# Patient Record
Sex: Male | Born: 1940 | Race: White | Hispanic: No | Marital: Married | Smoking: Current some day smoker
Health system: Southern US, Community
[De-identification: ages and names within clinical notes are randomized; demographics above are authoritative.]

## PROBLEM LIST (undated history)

## (undated) DIAGNOSIS — J45909 Unspecified asthma, uncomplicated: Secondary | ICD-10-CM

## (undated) DIAGNOSIS — M1990 Unspecified osteoarthritis, unspecified site: Secondary | ICD-10-CM

## (undated) DIAGNOSIS — N200 Calculus of kidney: Secondary | ICD-10-CM

## (undated) DIAGNOSIS — K219 Gastro-esophageal reflux disease without esophagitis: Secondary | ICD-10-CM

## (undated) DIAGNOSIS — J449 Chronic obstructive pulmonary disease, unspecified: Secondary | ICD-10-CM

## (undated) DIAGNOSIS — Z9981 Dependence on supplemental oxygen: Secondary | ICD-10-CM

## (undated) DIAGNOSIS — M48061 Spinal stenosis, lumbar region without neurogenic claudication: Secondary | ICD-10-CM

## (undated) DIAGNOSIS — H3530 Unspecified macular degeneration: Secondary | ICD-10-CM

## (undated) DIAGNOSIS — F419 Anxiety disorder, unspecified: Secondary | ICD-10-CM

## (undated) DIAGNOSIS — D649 Anemia, unspecified: Secondary | ICD-10-CM

## (undated) DIAGNOSIS — H35039 Hypertensive retinopathy, unspecified eye: Secondary | ICD-10-CM

## (undated) DIAGNOSIS — J90 Pleural effusion, not elsewhere classified: Secondary | ICD-10-CM

## (undated) DIAGNOSIS — I5022 Chronic systolic (congestive) heart failure: Secondary | ICD-10-CM

## (undated) DIAGNOSIS — I739 Peripheral vascular disease, unspecified: Secondary | ICD-10-CM

## (undated) DIAGNOSIS — E785 Hyperlipidemia, unspecified: Secondary | ICD-10-CM

## (undated) DIAGNOSIS — IMO0002 Reserved for concepts with insufficient information to code with codable children: Secondary | ICD-10-CM

## (undated) DIAGNOSIS — I10 Essential (primary) hypertension: Secondary | ICD-10-CM

## (undated) HISTORY — PX: KIDNEY STONE SURGERY: SHX686

## (undated) HISTORY — PX: LEG AMPUTATION: SHX1105

## (undated) HISTORY — PX: HERNIA REPAIR: SHX51

## (undated) HISTORY — PX: ABDOMINAL AORTIC ANEURYSM REPAIR: SUR1152

## (undated) HISTORY — PX: OTHER SURGICAL HISTORY: SHX169

## (undated) HISTORY — PX: NEPHROSTOMY TUBE PLACEMENT (ARMC HX): HXRAD1726

---

## 2004-10-09 ENCOUNTER — Emergency Department: Payer: Self-pay | Admitting: Unknown Physician Specialty

## 2005-05-13 ENCOUNTER — Emergency Department: Payer: Self-pay | Admitting: Unknown Physician Specialty

## 2011-05-22 DIAGNOSIS — K08129 Complete loss of teeth due to periodontal diseases, unspecified class: Secondary | ICD-10-CM | POA: Insufficient documentation

## 2011-11-10 DIAGNOSIS — H353290 Exudative age-related macular degeneration, unspecified eye, stage unspecified: Secondary | ICD-10-CM | POA: Insufficient documentation

## 2011-11-10 DIAGNOSIS — H35039 Hypertensive retinopathy, unspecified eye: Secondary | ICD-10-CM | POA: Insufficient documentation

## 2011-11-10 DIAGNOSIS — H25019 Cortical age-related cataract, unspecified eye: Secondary | ICD-10-CM | POA: Insufficient documentation

## 2011-11-10 DIAGNOSIS — H353190 Nonexudative age-related macular degeneration, unspecified eye, stage unspecified: Secondary | ICD-10-CM | POA: Insufficient documentation

## 2011-11-10 DIAGNOSIS — H2510 Age-related nuclear cataract, unspecified eye: Secondary | ICD-10-CM | POA: Insufficient documentation

## 2012-01-03 DIAGNOSIS — I7100 Dissection of unspecified site of aorta: Secondary | ICD-10-CM | POA: Insufficient documentation

## 2012-03-11 DIAGNOSIS — H3530 Unspecified macular degeneration: Secondary | ICD-10-CM | POA: Insufficient documentation

## 2013-04-04 DIAGNOSIS — G8929 Other chronic pain: Secondary | ICD-10-CM | POA: Insufficient documentation

## 2013-04-04 DIAGNOSIS — M549 Dorsalgia, unspecified: Secondary | ICD-10-CM | POA: Insufficient documentation

## 2013-10-24 ENCOUNTER — Emergency Department: Payer: Self-pay | Admitting: Emergency Medicine

## 2013-10-24 DIAGNOSIS — N390 Urinary tract infection, site not specified: Secondary | ICD-10-CM | POA: Diagnosis not present

## 2013-10-24 DIAGNOSIS — R339 Retention of urine, unspecified: Secondary | ICD-10-CM | POA: Diagnosis not present

## 2013-10-24 LAB — URINALYSIS, COMPLETE
BACTERIA: NONE SEEN
Bilirubin,UR: NEGATIVE
GLUCOSE, UR: NEGATIVE mg/dL (ref 0–75)
Ketone: NEGATIVE
NITRITE: POSITIVE
Ph: 7 (ref 4.5–8.0)
Protein: NEGATIVE
Specific Gravity: 1.008 (ref 1.003–1.030)
Squamous Epithelial: <1

## 2013-10-24 LAB — COMPREHENSIVE METABOLIC PANEL
AST: 12 U/L — AB (ref 15–37)
Albumin: 3.6 g/dL (ref 3.4–5.0)
Alkaline Phosphatase: 98 U/L
Anion Gap: 7 (ref 7–16)
BUN: 18 mg/dL (ref 7–18)
Bilirubin,Total: 0.3 mg/dL (ref 0.2–1.0)
Calcium, Total: 9.2 mg/dL (ref 8.5–10.1)
Chloride: 104 mmol/L (ref 98–107)
Co2: 26 mmol/L (ref 21–32)
Creatinine: 1.77 mg/dL — ABNORMAL HIGH (ref 0.60–1.30)
EGFR (African American): 44 — ABNORMAL LOW
EGFR (Non-African Amer.): 38 — ABNORMAL LOW
GLUCOSE: 96 mg/dL (ref 65–99)
OSMOLALITY: 276 (ref 275–301)
POTASSIUM: 4.4 mmol/L (ref 3.5–5.1)
SGPT (ALT): 23 U/L (ref 12–78)
Sodium: 137 mmol/L (ref 136–145)
Total Protein: 7.6 g/dL (ref 6.4–8.2)

## 2013-10-24 LAB — CBC
HCT: 40.8 % (ref 40.0–52.0)
HGB: 13.4 g/dL (ref 13.0–18.0)
MCH: 28.1 pg (ref 26.0–34.0)
MCHC: 32.9 g/dL (ref 32.0–36.0)
MCV: 85 fL (ref 80–100)
PLATELETS: 315 10*3/uL (ref 150–440)
RBC: 4.77 10*6/uL (ref 4.40–5.90)
RDW: 17.3 % — ABNORMAL HIGH (ref 11.5–14.5)
WBC: 11.8 10*3/uL — ABNORMAL HIGH (ref 3.8–10.6)

## 2013-11-01 ENCOUNTER — Emergency Department: Payer: Self-pay | Admitting: Internal Medicine

## 2013-11-04 ENCOUNTER — Emergency Department: Payer: Self-pay | Admitting: Emergency Medicine

## 2013-11-04 LAB — URINALYSIS, COMPLETE
BILIRUBIN, UR: NEGATIVE
Glucose,UR: NEGATIVE mg/dL (ref 0–75)
Ketone: NEGATIVE
Nitrite: NEGATIVE
PH: 7 (ref 4.5–8.0)
RBC,UR: 5 /HPF (ref 0–5)
SPECIFIC GRAVITY: 1.01 (ref 1.003–1.030)
Squamous Epithelial: NONE SEEN

## 2013-12-09 DIAGNOSIS — N138 Other obstructive and reflux uropathy: Secondary | ICD-10-CM | POA: Insufficient documentation

## 2013-12-09 DIAGNOSIS — N200 Calculus of kidney: Secondary | ICD-10-CM | POA: Insufficient documentation

## 2013-12-09 DIAGNOSIS — R399 Unspecified symptoms and signs involving the genitourinary system: Secondary | ICD-10-CM | POA: Insufficient documentation

## 2013-12-09 DIAGNOSIS — N401 Enlarged prostate with lower urinary tract symptoms: Secondary | ICD-10-CM

## 2013-12-17 ENCOUNTER — Ambulatory Visit: Payer: Self-pay | Admitting: Urology

## 2014-06-12 DIAGNOSIS — M5116 Intervertebral disc disorders with radiculopathy, lumbar region: Secondary | ICD-10-CM | POA: Insufficient documentation

## 2014-06-12 DIAGNOSIS — M5416 Radiculopathy, lumbar region: Secondary | ICD-10-CM | POA: Insufficient documentation

## 2014-07-10 ENCOUNTER — Inpatient Hospital Stay: Payer: Self-pay | Admitting: Internal Medicine

## 2014-07-10 DIAGNOSIS — E860 Dehydration: Secondary | ICD-10-CM | POA: Diagnosis present

## 2014-07-10 DIAGNOSIS — N2581 Secondary hyperparathyroidism of renal origin: Secondary | ICD-10-CM | POA: Diagnosis present

## 2014-07-10 DIAGNOSIS — N200 Calculus of kidney: Secondary | ICD-10-CM | POA: Diagnosis not present

## 2014-07-10 DIAGNOSIS — E871 Hypo-osmolality and hyponatremia: Secondary | ICD-10-CM | POA: Diagnosis present

## 2014-07-10 DIAGNOSIS — E876 Hypokalemia: Secondary | ICD-10-CM | POA: Diagnosis present

## 2014-07-10 DIAGNOSIS — N390 Urinary tract infection, site not specified: Secondary | ICD-10-CM | POA: Diagnosis not present

## 2014-07-10 DIAGNOSIS — E872 Acidosis: Secondary | ICD-10-CM | POA: Diagnosis present

## 2014-07-10 DIAGNOSIS — R531 Weakness: Secondary | ICD-10-CM | POA: Diagnosis not present

## 2014-07-10 DIAGNOSIS — D638 Anemia in other chronic diseases classified elsewhere: Secondary | ICD-10-CM | POA: Diagnosis present

## 2014-07-10 DIAGNOSIS — I129 Hypertensive chronic kidney disease with stage 1 through stage 4 chronic kidney disease, or unspecified chronic kidney disease: Secondary | ICD-10-CM | POA: Diagnosis present

## 2014-07-10 DIAGNOSIS — J449 Chronic obstructive pulmonary disease, unspecified: Secondary | ICD-10-CM | POA: Diagnosis present

## 2014-07-10 DIAGNOSIS — Z7982 Long term (current) use of aspirin: Secondary | ICD-10-CM | POA: Diagnosis not present

## 2014-07-10 DIAGNOSIS — N139 Obstructive and reflux uropathy, unspecified: Secondary | ICD-10-CM | POA: Diagnosis not present

## 2014-07-10 DIAGNOSIS — N186 End stage renal disease: Secondary | ICD-10-CM | POA: Diagnosis not present

## 2014-07-10 DIAGNOSIS — J45909 Unspecified asthma, uncomplicated: Secondary | ICD-10-CM | POA: Diagnosis present

## 2014-07-10 DIAGNOSIS — K219 Gastro-esophageal reflux disease without esophagitis: Secondary | ICD-10-CM | POA: Diagnosis present

## 2014-07-10 DIAGNOSIS — N179 Acute kidney failure, unspecified: Secondary | ICD-10-CM | POA: Diagnosis not present

## 2014-07-10 DIAGNOSIS — R4182 Altered mental status, unspecified: Secondary | ICD-10-CM | POA: Diagnosis not present

## 2014-07-10 DIAGNOSIS — N201 Calculus of ureter: Secondary | ICD-10-CM | POA: Diagnosis not present

## 2014-07-10 DIAGNOSIS — E875 Hyperkalemia: Secondary | ICD-10-CM | POA: Diagnosis not present

## 2014-07-10 DIAGNOSIS — J9811 Atelectasis: Secondary | ICD-10-CM | POA: Diagnosis not present

## 2014-07-10 DIAGNOSIS — N183 Chronic kidney disease, stage 3 (moderate): Secondary | ICD-10-CM | POA: Diagnosis present

## 2014-07-10 DIAGNOSIS — Z452 Encounter for adjustment and management of vascular access device: Secondary | ICD-10-CM | POA: Diagnosis not present

## 2014-07-10 DIAGNOSIS — R5383 Other fatigue: Secondary | ICD-10-CM | POA: Diagnosis not present

## 2014-07-10 DIAGNOSIS — N132 Hydronephrosis with renal and ureteral calculous obstruction: Secondary | ICD-10-CM | POA: Diagnosis present

## 2014-07-10 DIAGNOSIS — Z87442 Personal history of urinary calculi: Secondary | ICD-10-CM | POA: Diagnosis not present

## 2014-07-10 DIAGNOSIS — N170 Acute kidney failure with tubular necrosis: Secondary | ICD-10-CM | POA: Diagnosis not present

## 2014-07-10 DIAGNOSIS — Z992 Dependence on renal dialysis: Secondary | ICD-10-CM | POA: Diagnosis not present

## 2014-07-10 DIAGNOSIS — E1065 Type 1 diabetes mellitus with hyperglycemia: Secondary | ICD-10-CM | POA: Diagnosis not present

## 2014-07-10 DIAGNOSIS — I499 Cardiac arrhythmia, unspecified: Secondary | ICD-10-CM | POA: Diagnosis not present

## 2014-07-10 LAB — COMPREHENSIVE METABOLIC PANEL
ALBUMIN: 3.1 g/dL — AB (ref 3.4–5.0)
ALK PHOS: 150 U/L — AB
ALT: 23 U/L
Anion Gap: 13 (ref 7–16)
BUN: 150 mg/dL — ABNORMAL HIGH (ref 7–18)
Bilirubin,Total: 0.6 mg/dL (ref 0.2–1.0)
CO2: 12 mmol/L — AB (ref 21–32)
Calcium, Total: 8.4 mg/dL — ABNORMAL LOW (ref 8.5–10.1)
Chloride: 103 mmol/L (ref 98–107)
Creatinine: 12.63 mg/dL — ABNORMAL HIGH (ref 0.60–1.30)
EGFR (African American): 5 — ABNORMAL LOW
EGFR (Non-African Amer.): 4 — ABNORMAL LOW
Glucose: 108 mg/dL — ABNORMAL HIGH (ref 65–99)
Osmolality: 307 (ref 275–301)
Potassium: 7.6 mmol/L (ref 3.5–5.1)
SGOT(AST): 31 U/L (ref 15–37)
Sodium: 128 mmol/L — ABNORMAL LOW (ref 136–145)
TOTAL PROTEIN: 7.4 g/dL (ref 6.4–8.2)

## 2014-07-10 LAB — CBC
HCT: 36.6 % — ABNORMAL LOW (ref 40.0–52.0)
HGB: 11.4 g/dL — ABNORMAL LOW (ref 13.0–18.0)
MCH: 27.1 pg (ref 26.0–34.0)
MCHC: 31.2 g/dL — AB (ref 32.0–36.0)
MCV: 87 fL (ref 80–100)
PLATELETS: 209 10*3/uL (ref 150–440)
RBC: 4.22 10*6/uL — ABNORMAL LOW (ref 4.40–5.90)
RDW: 17.1 % — ABNORMAL HIGH (ref 11.5–14.5)
WBC: 26.8 10*3/uL — ABNORMAL HIGH (ref 3.8–10.6)

## 2014-07-10 LAB — BASIC METABOLIC PANEL
Anion Gap: 12 (ref 7–16)
BUN: 150 mg/dL — ABNORMAL HIGH (ref 7–18)
CO2: 13 mmol/L — AB (ref 21–32)
Calcium, Total: 8.1 mg/dL — ABNORMAL LOW (ref 8.5–10.1)
Chloride: 108 mmol/L — ABNORMAL HIGH (ref 98–107)
Creatinine: 12.06 mg/dL — ABNORMAL HIGH (ref 0.60–1.30)
EGFR (African American): 5 — ABNORMAL LOW
GFR CALC NON AF AMER: 4 — AB
GLUCOSE: 125 mg/dL — AB (ref 65–99)
OSMOLALITY: 317 (ref 275–301)
POTASSIUM: 7.3 mmol/L — AB (ref 3.5–5.1)
SODIUM: 133 mmol/L — AB (ref 136–145)

## 2014-07-10 LAB — PHOSPHORUS: PHOSPHORUS: 7.7 mg/dL — AB (ref 2.5–4.9)

## 2014-07-10 LAB — TROPONIN I: Troponin-I: <0.02 ng/mL

## 2014-07-10 LAB — URINALYSIS, COMPLETE
Bilirubin,UR: NEGATIVE
Glucose,UR: NEGATIVE mg/dL (ref 0–75)
Ketone: NEGATIVE
Nitrite: POSITIVE
PH: 5 (ref 4.5–8.0)
SQUAMOUS EPITHELIAL: NONE SEEN
Specific Gravity: 1.014 (ref 1.003–1.030)
WBC UR: 51 /HPF (ref 0–5)

## 2014-07-10 LAB — SODIUM, URINE, RANDOM: Sodium, Urine Random: 50 mmol/L (ref 20–110)

## 2014-07-10 LAB — PROTEIN, URINE, RANDOM: Protein, Random Urine: 50 mg/dL — ABNORMAL HIGH (ref 0–12)

## 2014-07-10 LAB — OSMOLALITY, URINE: OSMOLALITY: 414 mosm/kg

## 2014-07-10 LAB — CREATININE, URINE, RANDOM: Creatinine, Urine Random: 98.2 mg/dL (ref 30.0–125.0)

## 2014-07-10 LAB — CK: CK, Total: 21 U/L — ABNORMAL LOW

## 2014-07-11 LAB — BASIC METABOLIC PANEL
Anion Gap: 13 (ref 7–16)
Anion Gap: 11 (ref 7–16)
BUN: 112 mg/dL — AB (ref 7–18)
BUN: 108 mg/dL — ABNORMAL HIGH (ref 7–18)
CALCIUM: 7.9 mg/dL — AB (ref 8.5–10.1)
CHLORIDE: 106 mmol/L (ref 98–107)
CO2: 19 mmol/L — AB (ref 21–32)
CREATININE: 8.73 mg/dL — AB (ref 0.60–1.30)
Calcium, Total: 7.7 mg/dL — ABNORMAL LOW (ref 8.5–10.1)
Chloride: 104 mmol/L (ref 98–107)
Co2: 18 mmol/L — ABNORMAL LOW (ref 21–32)
Creatinine: 8.86 mg/dL — ABNORMAL HIGH (ref 0.60–1.30)
EGFR (African American): 8 — ABNORMAL LOW
EGFR (Non-African Amer.): 6 — ABNORMAL LOW
GFR CALC NON AF AMER: 6 — AB
GLUCOSE: 121 mg/dL — AB (ref 65–99)
GLUCOSE: 94 mg/dL (ref 65–99)
OSMOLALITY: 307 (ref 275–301)
OSMOLALITY: 306 (ref 275–301)
Potassium: 5.6 mmol/L — ABNORMAL HIGH (ref 3.5–5.1)
Potassium: 6 mmol/L — ABNORMAL HIGH (ref 3.5–5.1)
Sodium: 135 mmol/L — ABNORMAL LOW (ref 136–145)
Sodium: 136 mmol/L (ref 136–145)

## 2014-07-11 LAB — CBC WITH DIFFERENTIAL/PLATELET
Basophil #: 0 10*3/uL (ref 0.0–0.1)
Basophil %: 0.2 %
EOS ABS: 0 10*3/uL (ref 0.0–0.7)
EOS PCT: 0.4 %
HCT: 27.8 % — ABNORMAL LOW (ref 40.0–52.0)
HGB: 9 g/dL — ABNORMAL LOW (ref 13.0–18.0)
LYMPHS PCT: 7.6 %
Lymphocyte #: 0.8 10*3/uL — ABNORMAL LOW (ref 1.0–3.6)
MCH: 27.5 pg (ref 26.0–34.0)
MCHC: 32.3 g/dL (ref 32.0–36.0)
MCV: 85 fL (ref 80–100)
MONO ABS: 1.6 x10 3/mm — AB (ref 0.2–1.0)
Monocyte %: 16.3 %
Neutrophil #: 7.5 10*3/uL — ABNORMAL HIGH (ref 1.4–6.5)
Neutrophil %: 75.5 %
PLATELETS: 145 10*3/uL — AB (ref 150–440)
RBC: 3.26 10*6/uL — AB (ref 4.40–5.90)
RDW: 17.1 % — ABNORMAL HIGH (ref 11.5–14.5)
WBC: 10 10*3/uL (ref 3.8–10.6)

## 2014-07-11 LAB — PHOSPHORUS: PHOSPHORUS: 5.6 mg/dL — AB (ref 2.5–4.9)

## 2014-07-11 LAB — URINE CULTURE

## 2014-07-12 ENCOUNTER — Ambulatory Visit: Payer: Self-pay | Admitting: Internal Medicine

## 2014-07-12 LAB — BASIC METABOLIC PANEL
Anion Gap: 11 (ref 7–16)
BUN: 56 mg/dL — AB (ref 7–18)
CALCIUM: 7.6 mg/dL — AB (ref 8.5–10.1)
CO2: 22 mmol/L (ref 21–32)
Chloride: 106 mmol/L (ref 98–107)
Creatinine: 5.59 mg/dL — ABNORMAL HIGH (ref 0.60–1.30)
EGFR (African American): 13 — ABNORMAL LOW
EGFR (Non-African Amer.): 11 — ABNORMAL LOW
GLUCOSE: 84 mg/dL (ref 65–99)
OSMOLALITY: 292 (ref 275–301)
POTASSIUM: 4.6 mmol/L (ref 3.5–5.1)
Sodium: 139 mmol/L (ref 136–145)

## 2014-07-12 LAB — PROTEIN / CREATININE RATIO, URINE
CREATININE, URINE: 74.2 mg/dL (ref 30.0–125.0)
PROTEIN/CREAT. RATIO: 863 mg/g{creat} — AB (ref 0–200)
Protein, Random Urine: 64 mg/dL — ABNORMAL HIGH (ref 0–12)

## 2014-07-13 LAB — BASIC METABOLIC PANEL
ANION GAP: 12 (ref 7–16)
BUN: 52 mg/dL — ABNORMAL HIGH (ref 7–18)
CHLORIDE: 107 mmol/L (ref 98–107)
CREATININE: 5.59 mg/dL — AB (ref 0.60–1.30)
Calcium, Total: 7.4 mg/dL — ABNORMAL LOW (ref 8.5–10.1)
Co2: 21 mmol/L (ref 21–32)
GFR CALC AF AMER: 13 — AB
GFR CALC NON AF AMER: 11 — AB
Glucose: 88 mg/dL (ref 65–99)
OSMOLALITY: 293 (ref 275–301)
Potassium: 4.5 mmol/L (ref 3.5–5.1)
SODIUM: 140 mmol/L (ref 136–145)

## 2014-07-14 LAB — PRO B NATRIURETIC PEPTIDE: B-Type Natriuretic Peptide: 34421 pg/mL — ABNORMAL HIGH (ref 0–125)

## 2014-07-14 LAB — BASIC METABOLIC PANEL
Anion Gap: 9 (ref 7–16)
BUN: 52 mg/dL — AB (ref 7–18)
CO2: 21 mmol/L (ref 21–32)
Calcium, Total: 7.5 mg/dL — ABNORMAL LOW (ref 8.5–10.1)
Chloride: 112 mmol/L — ABNORMAL HIGH (ref 98–107)
Creatinine: 5.44 mg/dL — ABNORMAL HIGH (ref 0.60–1.30)
EGFR (African American): 13 — ABNORMAL LOW
GFR CALC NON AF AMER: 11 — AB
GLUCOSE: 90 mg/dL (ref 65–99)
Osmolality: 297 (ref 275–301)
Potassium: 4.8 mmol/L (ref 3.5–5.1)
SODIUM: 142 mmol/L (ref 136–145)

## 2014-07-14 LAB — UR PROT ELECTROPHORESIS, URINE RANDOM

## 2014-07-14 LAB — PROTEIN ELECTROPHORESIS(ARMC)

## 2014-07-15 LAB — BASIC METABOLIC PANEL
ANION GAP: 9 (ref 7–16)
BUN: 52 mg/dL — ABNORMAL HIGH (ref 7–18)
CREATININE: 5.8 mg/dL — AB (ref 0.60–1.30)
Calcium, Total: 8.2 mg/dL — ABNORMAL LOW (ref 8.5–10.1)
Chloride: 112 mmol/L — ABNORMAL HIGH (ref 98–107)
Co2: 20 mmol/L — ABNORMAL LOW (ref 21–32)
EGFR (African American): 12 — ABNORMAL LOW
GFR CALC NON AF AMER: 10 — AB
Glucose: 89 mg/dL (ref 65–99)
Osmolality: 295 (ref 275–301)
Potassium: 5.1 mmol/L (ref 3.5–5.1)
Sodium: 141 mmol/L (ref 136–145)

## 2014-07-15 LAB — PLATELET COUNT: PLATELETS: 255 10*3/uL (ref 150–440)

## 2014-07-16 LAB — CBC WITH DIFFERENTIAL/PLATELET
BASOS PCT: 0.8 %
Basophil #: 0.1 10*3/uL (ref 0.0–0.1)
Eosinophil #: 0.2 10*3/uL (ref 0.0–0.7)
Eosinophil %: 1.6 %
HCT: 24.6 % — ABNORMAL LOW (ref 40.0–52.0)
HGB: 7.7 g/dL — AB (ref 13.0–18.0)
LYMPHS PCT: 11 %
Lymphocyte #: 1.4 10*3/uL (ref 1.0–3.6)
MCH: 26.9 pg (ref 26.0–34.0)
MCHC: 31.4 g/dL — AB (ref 32.0–36.0)
MCV: 86 fL (ref 80–100)
MONO ABS: 0.8 x10 3/mm (ref 0.2–1.0)
Monocyte %: 6.3 %
Neutrophil #: 10.1 10*3/uL — ABNORMAL HIGH (ref 1.4–6.5)
Neutrophil %: 80.3 %
Platelet: 269 10*3/uL (ref 150–440)
RBC: 2.86 10*6/uL — AB (ref 4.40–5.90)
RDW: 16.8 % — ABNORMAL HIGH (ref 11.5–14.5)
WBC: 12.6 10*3/uL — AB (ref 3.8–10.6)

## 2014-07-16 LAB — IRON AND TIBC
IRON BIND. CAP.(TOTAL): 168 ug/dL — AB (ref 250–450)
IRON SATURATION: 13 %
IRON: 21 ug/dL — AB (ref 65–175)
UNBOUND IRON-BIND. CAP.: 147 ug/dL

## 2014-07-16 LAB — FERRITIN: Ferritin (ARMC): 182 ng/mL (ref 8–388)

## 2014-07-16 LAB — BASIC METABOLIC PANEL
Anion Gap: 6 — ABNORMAL LOW (ref 7–16)
BUN: 31 mg/dL — ABNORMAL HIGH (ref 7–18)
CHLORIDE: 106 mmol/L (ref 98–107)
Calcium, Total: 7.7 mg/dL — ABNORMAL LOW (ref 8.5–10.1)
Co2: 28 mmol/L (ref 21–32)
Creatinine: 4.15 mg/dL — ABNORMAL HIGH (ref 0.60–1.30)
EGFR (African American): 18 — ABNORMAL LOW
GFR CALC NON AF AMER: 15 — AB
Glucose: 97 mg/dL (ref 65–99)
Osmolality: 286 (ref 275–301)
Potassium: 4.5 mmol/L (ref 3.5–5.1)
SODIUM: 140 mmol/L (ref 136–145)

## 2014-07-16 LAB — RETICULOCYTES
ABSOLUTE RETIC COUNT: 0.0816 10*6/uL (ref 0.019–0.186)
RETICULOCYTE: 2.85 % (ref 0.4–3.1)

## 2014-07-17 LAB — KAPPA/LAMBDA FREE LIGHT CHAINS (ARMC)

## 2014-07-17 LAB — PROT IMMUNOELECTROPHORES(ARMC)

## 2014-07-19 LAB — CULTURE, BLOOD (SINGLE)

## 2014-08-11 ENCOUNTER — Ambulatory Visit: Payer: Self-pay | Admitting: Internal Medicine

## 2014-08-12 DIAGNOSIS — D631 Anemia in chronic kidney disease: Secondary | ICD-10-CM | POA: Diagnosis not present

## 2014-08-12 DIAGNOSIS — I10 Essential (primary) hypertension: Secondary | ICD-10-CM | POA: Diagnosis not present

## 2014-08-12 DIAGNOSIS — N179 Acute kidney failure, unspecified: Secondary | ICD-10-CM | POA: Diagnosis not present

## 2014-08-12 DIAGNOSIS — N189 Chronic kidney disease, unspecified: Secondary | ICD-10-CM | POA: Diagnosis not present

## 2014-08-12 DIAGNOSIS — N2581 Secondary hyperparathyroidism of renal origin: Secondary | ICD-10-CM | POA: Diagnosis not present

## 2014-08-13 ENCOUNTER — Inpatient Hospital Stay: Payer: Self-pay | Admitting: Internal Medicine

## 2014-08-13 DIAGNOSIS — I10 Essential (primary) hypertension: Secondary | ICD-10-CM | POA: Diagnosis not present

## 2014-08-13 DIAGNOSIS — N2581 Secondary hyperparathyroidism of renal origin: Secondary | ICD-10-CM | POA: Diagnosis not present

## 2014-08-13 DIAGNOSIS — T8249XD Other complication of vascular dialysis catheter, subsequent encounter: Secondary | ICD-10-CM | POA: Diagnosis not present

## 2014-08-13 DIAGNOSIS — D638 Anemia in other chronic diseases classified elsewhere: Secondary | ICD-10-CM | POA: Diagnosis present

## 2014-08-13 DIAGNOSIS — N139 Obstructive and reflux uropathy, unspecified: Secondary | ICD-10-CM | POA: Diagnosis not present

## 2014-08-13 DIAGNOSIS — E875 Hyperkalemia: Secondary | ICD-10-CM | POA: Diagnosis present

## 2014-08-13 DIAGNOSIS — Z87891 Personal history of nicotine dependence: Secondary | ICD-10-CM | POA: Diagnosis not present

## 2014-08-13 DIAGNOSIS — I120 Hypertensive chronic kidney disease with stage 5 chronic kidney disease or end stage renal disease: Secondary | ICD-10-CM | POA: Diagnosis present

## 2014-08-13 DIAGNOSIS — J449 Chronic obstructive pulmonary disease, unspecified: Secondary | ICD-10-CM | POA: Diagnosis present

## 2014-08-13 DIAGNOSIS — N179 Acute kidney failure, unspecified: Secondary | ICD-10-CM | POA: Diagnosis not present

## 2014-08-13 DIAGNOSIS — E871 Hypo-osmolality and hyponatremia: Secondary | ICD-10-CM | POA: Diagnosis present

## 2014-08-13 DIAGNOSIS — R1310 Dysphagia, unspecified: Secondary | ICD-10-CM | POA: Diagnosis present

## 2014-08-13 DIAGNOSIS — R109 Unspecified abdominal pain: Secondary | ICD-10-CM | POA: Diagnosis not present

## 2014-08-13 DIAGNOSIS — Z8249 Family history of ischemic heart disease and other diseases of the circulatory system: Secondary | ICD-10-CM | POA: Diagnosis not present

## 2014-08-13 DIAGNOSIS — Z87442 Personal history of urinary calculi: Secondary | ICD-10-CM | POA: Diagnosis not present

## 2014-08-13 DIAGNOSIS — Z87440 Personal history of urinary (tract) infections: Secondary | ICD-10-CM | POA: Diagnosis not present

## 2014-08-13 DIAGNOSIS — N132 Hydronephrosis with renal and ureteral calculous obstruction: Secondary | ICD-10-CM | POA: Diagnosis present

## 2014-08-13 DIAGNOSIS — N1330 Unspecified hydronephrosis: Secondary | ICD-10-CM | POA: Diagnosis not present

## 2014-08-13 DIAGNOSIS — N186 End stage renal disease: Secondary | ICD-10-CM | POA: Diagnosis present

## 2014-08-13 DIAGNOSIS — F419 Anxiety disorder, unspecified: Secondary | ICD-10-CM | POA: Diagnosis present

## 2014-08-13 DIAGNOSIS — M4800 Spinal stenosis, site unspecified: Secondary | ICD-10-CM | POA: Diagnosis present

## 2014-08-13 DIAGNOSIS — R634 Abnormal weight loss: Secondary | ICD-10-CM | POA: Diagnosis present

## 2014-08-13 DIAGNOSIS — K089 Disorder of teeth and supporting structures, unspecified: Secondary | ICD-10-CM | POA: Diagnosis present

## 2014-08-13 DIAGNOSIS — N170 Acute kidney failure with tubular necrosis: Secondary | ICD-10-CM | POA: Diagnosis present

## 2014-08-13 DIAGNOSIS — K228 Other specified diseases of esophagus: Secondary | ICD-10-CM | POA: Diagnosis not present

## 2014-08-13 DIAGNOSIS — K219 Gastro-esophageal reflux disease without esophagitis: Secondary | ICD-10-CM | POA: Diagnosis present

## 2014-08-13 DIAGNOSIS — G8929 Other chronic pain: Secondary | ICD-10-CM | POA: Diagnosis present

## 2014-08-13 DIAGNOSIS — N183 Chronic kidney disease, stage 3 (moderate): Secondary | ICD-10-CM | POA: Diagnosis not present

## 2014-08-13 DIAGNOSIS — E872 Acidosis: Secondary | ICD-10-CM | POA: Diagnosis present

## 2014-08-13 DIAGNOSIS — N202 Calculus of kidney with calculus of ureter: Secondary | ICD-10-CM | POA: Diagnosis present

## 2014-08-13 DIAGNOSIS — I5033 Acute on chronic diastolic (congestive) heart failure: Secondary | ICD-10-CM | POA: Diagnosis not present

## 2014-08-13 LAB — BASIC METABOLIC PANEL
Anion Gap: 16 (ref 7–16)
BUN: 133 mg/dL — AB (ref 7–18)
CALCIUM: 8.7 mg/dL (ref 8.5–10.1)
CHLORIDE: 104 mmol/L (ref 98–107)
Co2: 17 mmol/L — ABNORMAL LOW (ref 21–32)
Creatinine: 8.23 mg/dL — ABNORMAL HIGH (ref 0.60–1.30)
EGFR (African American): 8 — ABNORMAL LOW
EGFR (Non-African Amer.): 7 — ABNORMAL LOW
Glucose: 136 mg/dL — ABNORMAL HIGH (ref 65–99)
OSMOLALITY: 319 (ref 275–301)
POTASSIUM: 5.6 mmol/L — AB (ref 3.5–5.1)
SODIUM: 137 mmol/L (ref 136–145)

## 2014-08-14 LAB — BASIC METABOLIC PANEL
Anion Gap: 16 (ref 7–16)
BUN: 130 mg/dL — ABNORMAL HIGH (ref 7–18)
CHLORIDE: 104 mmol/L (ref 98–107)
CREATININE: 8.29 mg/dL — AB (ref 0.60–1.30)
Calcium, Total: 8.8 mg/dL (ref 8.5–10.1)
Co2: 16 mmol/L — ABNORMAL LOW (ref 21–32)
EGFR (African American): 8 — ABNORMAL LOW
EGFR (Non-African Amer.): 7 — ABNORMAL LOW
Glucose: 146 mg/dL — ABNORMAL HIGH (ref 65–99)
OSMOLALITY: 316 (ref 275–301)
Potassium: 5.4 mmol/L — ABNORMAL HIGH (ref 3.5–5.1)
Sodium: 136 mmol/L (ref 136–145)

## 2014-08-14 LAB — POTASSIUM: Potassium: 4.5 mmol/L (ref 3.5–5.1)

## 2014-08-14 LAB — PHOSPHORUS: Phosphorus: 8.7 mg/dL — ABNORMAL HIGH (ref 2.5–4.9)

## 2014-08-14 LAB — PLATELET COUNT: Platelet: 190 10*3/uL (ref 150–440)

## 2014-08-15 LAB — BASIC METABOLIC PANEL
Anion Gap: 14 (ref 7–16)
BUN: 101 mg/dL — AB (ref 7–18)
Calcium, Total: 8.3 mg/dL — ABNORMAL LOW (ref 8.5–10.1)
Chloride: 100 mmol/L (ref 98–107)
Co2: 23 mmol/L (ref 21–32)
Creatinine: 6.71 mg/dL — ABNORMAL HIGH (ref 0.60–1.30)
EGFR (African American): 11 — ABNORMAL LOW
EGFR (Non-African Amer.): 9 — ABNORMAL LOW
Glucose: 115 mg/dL — ABNORMAL HIGH (ref 65–99)
Osmolality: 306 (ref 275–301)
Potassium: 4.3 mmol/L (ref 3.5–5.1)
SODIUM: 137 mmol/L (ref 136–145)

## 2014-08-15 LAB — URINALYSIS, COMPLETE
BILIRUBIN, UR: NEGATIVE
Bacteria: NONE SEEN
KETONE: NEGATIVE
NITRITE: NEGATIVE
PH: 6 (ref 4.5–8.0)
Protein: 100
RBC,UR: 5 /HPF (ref 0–5)
SQUAMOUS EPITHELIAL: NONE SEEN
Specific Gravity: 1.015 (ref 1.003–1.030)
WBC UR: 20 /HPF (ref 0–5)

## 2014-08-15 LAB — CBC WITH DIFFERENTIAL/PLATELET
BASOS ABS: 0 10*3/uL (ref 0.0–0.1)
BASOS PCT: 0.1 %
EOS ABS: 0 10*3/uL (ref 0.0–0.7)
Eosinophil %: 0.4 %
HCT: 29.3 % — ABNORMAL LOW (ref 40.0–52.0)
HGB: 9.2 g/dL — AB (ref 13.0–18.0)
Lymphocyte #: 1.2 10*3/uL (ref 1.0–3.6)
Lymphocyte %: 11.1 %
MCH: 28.4 pg (ref 26.0–34.0)
MCHC: 31.3 g/dL — ABNORMAL LOW (ref 32.0–36.0)
MCV: 91 fL (ref 80–100)
MONOS PCT: 6 %
Monocyte #: 0.7 x10 3/mm (ref 0.2–1.0)
NEUTROS PCT: 82.4 %
Neutrophil #: 9.1 10*3/uL — ABNORMAL HIGH (ref 1.4–6.5)
Platelet: 175 10*3/uL (ref 150–440)
RBC: 3.23 10*6/uL — AB (ref 4.40–5.90)
RDW: 20.9 % — ABNORMAL HIGH (ref 11.5–14.5)
WBC: 11.1 10*3/uL — ABNORMAL HIGH (ref 3.8–10.6)

## 2014-08-16 LAB — BASIC METABOLIC PANEL
ANION GAP: 13 (ref 7–16)
BUN: 61 mg/dL — ABNORMAL HIGH (ref 7–18)
CO2: 24 mmol/L (ref 21–32)
Calcium, Total: 7.9 mg/dL — ABNORMAL LOW (ref 8.5–10.1)
Chloride: 102 mmol/L (ref 98–107)
Creatinine: 4.02 mg/dL — ABNORMAL HIGH (ref 0.60–1.30)
EGFR (African American): 19 — ABNORMAL LOW
EGFR (Non-African Amer.): 16 — ABNORMAL LOW
GLUCOSE: 104 mg/dL — AB (ref 65–99)
OSMOLALITY: 295 (ref 275–301)
Potassium: 4.8 mmol/L (ref 3.5–5.1)
SODIUM: 139 mmol/L (ref 136–145)

## 2014-08-16 LAB — URINE CULTURE

## 2014-08-17 LAB — RENAL FUNCTION PANEL
ALBUMIN: 3 g/dL — AB (ref 3.4–5.0)
Anion Gap: 11 (ref 7–16)
BUN: 53 mg/dL — AB (ref 7–18)
CHLORIDE: 101 mmol/L (ref 98–107)
CO2: 27 mmol/L (ref 21–32)
Calcium, Total: 8.1 mg/dL — ABNORMAL LOW (ref 8.5–10.1)
Creatinine: 3.83 mg/dL — ABNORMAL HIGH (ref 0.60–1.30)
EGFR (African American): 20 — ABNORMAL LOW
GFR CALC NON AF AMER: 17 — AB
GLUCOSE: 142 mg/dL — AB (ref 65–99)
Osmolality: 294 (ref 275–301)
Phosphorus: 2.9 mg/dL (ref 2.5–4.9)
Potassium: 4.3 mmol/L (ref 3.5–5.1)
Sodium: 139 mmol/L (ref 136–145)

## 2014-08-17 LAB — BASIC METABOLIC PANEL
Anion Gap: 12 (ref 7–16)
BUN: 55 mg/dL — ABNORMAL HIGH (ref 7–18)
Calcium, Total: 8.4 mg/dL — ABNORMAL LOW (ref 8.5–10.1)
Chloride: 101 mmol/L (ref 98–107)
Co2: 26 mmol/L (ref 21–32)
Creatinine: 3.84 mg/dL — ABNORMAL HIGH (ref 0.60–1.30)
EGFR (African American): 20 — ABNORMAL LOW
EGFR (Non-African Amer.): 17 — ABNORMAL LOW
GLUCOSE: 141 mg/dL — AB (ref 65–99)
Osmolality: 295 (ref 275–301)
Potassium: 4.1 mmol/L (ref 3.5–5.1)
Sodium: 139 mmol/L (ref 136–145)

## 2014-08-18 LAB — RENAL FUNCTION PANEL
ANION GAP: 8 (ref 7–16)
Albumin: 2.9 g/dL — ABNORMAL LOW (ref 3.4–5.0)
BUN: 56 mg/dL — ABNORMAL HIGH (ref 7–18)
CHLORIDE: 103 mmol/L (ref 98–107)
CREATININE: 3.65 mg/dL — AB (ref 0.60–1.30)
Calcium, Total: 8.2 mg/dL — ABNORMAL LOW (ref 8.5–10.1)
Co2: 26 mmol/L (ref 21–32)
EGFR (African American): 21 — ABNORMAL LOW
GFR CALC NON AF AMER: 18 — AB
Glucose: 104 mg/dL — ABNORMAL HIGH (ref 65–99)
OSMOLALITY: 290 (ref 275–301)
PHOSPHORUS: 2.8 mg/dL (ref 2.5–4.9)
Potassium: 4.5 mmol/L (ref 3.5–5.1)
Sodium: 137 mmol/L (ref 136–145)

## 2014-09-07 DIAGNOSIS — D631 Anemia in chronic kidney disease: Secondary | ICD-10-CM | POA: Diagnosis not present

## 2014-09-07 DIAGNOSIS — N189 Chronic kidney disease, unspecified: Secondary | ICD-10-CM | POA: Diagnosis not present

## 2014-09-07 DIAGNOSIS — N179 Acute kidney failure, unspecified: Secondary | ICD-10-CM | POA: Diagnosis not present

## 2014-09-07 DIAGNOSIS — I10 Essential (primary) hypertension: Secondary | ICD-10-CM | POA: Diagnosis not present

## 2014-09-07 DIAGNOSIS — N184 Chronic kidney disease, stage 4 (severe): Secondary | ICD-10-CM | POA: Diagnosis not present

## 2014-12-18 ENCOUNTER — Emergency Department
Admit: 2014-12-18 | Disposition: A | Discharge status: Home / Self Care | Payer: Self-pay | Admitting: Emergency Medicine

## 2014-12-18 DIAGNOSIS — J441 Chronic obstructive pulmonary disease with (acute) exacerbation: Secondary | ICD-10-CM | POA: Diagnosis not present

## 2014-12-18 DIAGNOSIS — R0789 Other chest pain: Secondary | ICD-10-CM | POA: Diagnosis not present

## 2014-12-18 DIAGNOSIS — J189 Pneumonia, unspecified organism: Secondary | ICD-10-CM | POA: Diagnosis not present

## 2014-12-18 DIAGNOSIS — R0602 Shortness of breath: Secondary | ICD-10-CM | POA: Diagnosis not present

## 2014-12-18 LAB — COMPREHENSIVE METABOLIC PANEL
ALBUMIN: 3.7 g/dL
ALK PHOS: 113 U/L
ALT: 18 U/L
Anion Gap: 7 (ref 7–16)
BUN: 41 mg/dL — ABNORMAL HIGH
Bilirubin,Total: 0.4 mg/dL
Calcium, Total: 9.1 mg/dL
Chloride: 108 mmol/L
Co2: 23 mmol/L
Creatinine: 1.53 mg/dL — ABNORMAL HIGH
EGFR (Non-African Amer.): 45 — ABNORMAL LOW
GFR CALC AF AMER: 52 — AB
Glucose: 117 mg/dL — ABNORMAL HIGH
Potassium: 3.9 mmol/L
SGOT(AST): 23 U/L
Sodium: 138 mmol/L
Total Protein: 6.5 g/dL

## 2014-12-18 LAB — CBC
HCT: 45 % (ref 40.0–52.0)
HGB: 13.9 g/dL (ref 13.0–18.0)
MCH: 24.2 pg — ABNORMAL LOW (ref 26.0–34.0)
MCHC: 30.8 g/dL — ABNORMAL LOW (ref 32.0–36.0)
MCV: 79 fL — ABNORMAL LOW (ref 80–100)
Platelet: 212 10*3/uL (ref 150–440)
RBC: 5.72 10*6/uL (ref 4.40–5.90)
RDW: 19.9 % — ABNORMAL HIGH (ref 11.5–14.5)
WBC: 11.7 10*3/uL — ABNORMAL HIGH (ref 3.8–10.6)

## 2014-12-18 LAB — TROPONIN I: Troponin-I: 0.03 ng/mL

## 2014-12-23 ENCOUNTER — Inpatient Hospital Stay
Admit: 2014-12-23 | Disposition: A | Discharge status: Home / Self Care | Payer: Self-pay | Attending: Internal Medicine | Admitting: Internal Medicine

## 2014-12-23 DIAGNOSIS — J45909 Unspecified asthma, uncomplicated: Secondary | ICD-10-CM | POA: Diagnosis present

## 2014-12-23 DIAGNOSIS — M4806 Spinal stenosis, lumbar region: Secondary | ICD-10-CM | POA: Diagnosis present

## 2014-12-23 DIAGNOSIS — M47816 Spondylosis without myelopathy or radiculopathy, lumbar region: Secondary | ICD-10-CM | POA: Diagnosis not present

## 2014-12-23 DIAGNOSIS — D631 Anemia in chronic kidney disease: Secondary | ICD-10-CM | POA: Diagnosis present

## 2014-12-23 DIAGNOSIS — F17210 Nicotine dependence, cigarettes, uncomplicated: Secondary | ICD-10-CM | POA: Diagnosis present

## 2014-12-23 DIAGNOSIS — I700 Atherosclerosis of aorta: Secondary | ICD-10-CM | POA: Diagnosis not present

## 2014-12-23 DIAGNOSIS — K210 Gastro-esophageal reflux disease with esophagitis: Secondary | ICD-10-CM | POA: Diagnosis present

## 2014-12-23 DIAGNOSIS — J441 Chronic obstructive pulmonary disease with (acute) exacerbation: Secondary | ICD-10-CM | POA: Diagnosis present

## 2014-12-23 DIAGNOSIS — E875 Hyperkalemia: Secondary | ICD-10-CM | POA: Diagnosis present

## 2014-12-23 DIAGNOSIS — I5022 Chronic systolic (congestive) heart failure: Secondary | ICD-10-CM | POA: Diagnosis present

## 2014-12-23 DIAGNOSIS — M7989 Other specified soft tissue disorders: Secondary | ICD-10-CM | POA: Diagnosis present

## 2014-12-23 DIAGNOSIS — Z7951 Long term (current) use of inhaled steroids: Secondary | ICD-10-CM | POA: Diagnosis not present

## 2014-12-23 DIAGNOSIS — F419 Anxiety disorder, unspecified: Secondary | ICD-10-CM | POA: Diagnosis present

## 2014-12-23 DIAGNOSIS — J80 Acute respiratory distress syndrome: Secondary | ICD-10-CM | POA: Diagnosis present

## 2014-12-23 DIAGNOSIS — J189 Pneumonia, unspecified organism: Secondary | ICD-10-CM | POA: Diagnosis present

## 2014-12-23 DIAGNOSIS — Z79899 Other long term (current) drug therapy: Secondary | ICD-10-CM | POA: Diagnosis not present

## 2014-12-23 DIAGNOSIS — N184 Chronic kidney disease, stage 4 (severe): Secondary | ICD-10-CM | POA: Diagnosis present

## 2014-12-23 DIAGNOSIS — I129 Hypertensive chronic kidney disease with stage 1 through stage 4 chronic kidney disease, or unspecified chronic kidney disease: Secondary | ICD-10-CM | POA: Diagnosis present

## 2014-12-23 DIAGNOSIS — J9811 Atelectasis: Secondary | ICD-10-CM | POA: Diagnosis not present

## 2014-12-23 DIAGNOSIS — J90 Pleural effusion, not elsewhere classified: Secondary | ICD-10-CM | POA: Diagnosis not present

## 2014-12-23 DIAGNOSIS — M5136 Other intervertebral disc degeneration, lumbar region: Secondary | ICD-10-CM | POA: Diagnosis not present

## 2014-12-23 DIAGNOSIS — Z792 Long term (current) use of antibiotics: Secondary | ICD-10-CM | POA: Diagnosis not present

## 2014-12-23 DIAGNOSIS — G8929 Other chronic pain: Secondary | ICD-10-CM | POA: Diagnosis present

## 2014-12-23 DIAGNOSIS — M545 Low back pain: Secondary | ICD-10-CM | POA: Diagnosis present

## 2014-12-23 DIAGNOSIS — Z7982 Long term (current) use of aspirin: Secondary | ICD-10-CM | POA: Diagnosis not present

## 2014-12-23 DIAGNOSIS — R918 Other nonspecific abnormal finding of lung field: Secondary | ICD-10-CM | POA: Diagnosis not present

## 2014-12-23 DIAGNOSIS — Z79891 Long term (current) use of opiate analgesic: Secondary | ICD-10-CM | POA: Diagnosis not present

## 2014-12-23 LAB — BASIC METABOLIC PANEL
ANION GAP: 8 (ref 7–16)
BUN: 32 mg/dL — AB
CO2: 24 mmol/L
CREATININE: 1.51 mg/dL — AB
Calcium, Total: 8.4 mg/dL — ABNORMAL LOW
Chloride: 108 mmol/L
GFR CALC AF AMER: 53 — AB
GFR CALC NON AF AMER: 45 — AB
GLUCOSE: 117 mg/dL — AB
Potassium: 3.7 mmol/L
Sodium: 140 mmol/L

## 2014-12-23 LAB — CK TOTAL AND CKMB (NOT AT ARMC)
CK, Total: 38 U/L — ABNORMAL LOW
CK, Total: 41 U/L — ABNORMAL LOW
CK-MB: 4.5 ng/mL
CK-MB: 4.6 ng/mL

## 2014-12-23 LAB — CBC WITH DIFFERENTIAL/PLATELET
BASOS ABS: 0 10*3/uL (ref 0.0–0.1)
Basophil %: 0.3 %
EOS ABS: 0.1 10*3/uL (ref 0.0–0.7)
Eosinophil %: 0.6 %
HCT: 42.8 % (ref 40.0–52.0)
HGB: 13.4 g/dL (ref 13.0–18.0)
Lymphocyte #: 1.8 10*3/uL (ref 1.0–3.6)
Lymphocyte %: 16.4 %
MCH: 24.5 pg — ABNORMAL LOW (ref 26.0–34.0)
MCHC: 31.4 g/dL — ABNORMAL LOW (ref 32.0–36.0)
MCV: 78 fL — ABNORMAL LOW (ref 80–100)
Monocyte #: 0.7 x10 3/mm (ref 0.2–1.0)
Monocyte %: 6.4 %
NEUTROS PCT: 76.3 %
Neutrophil #: 8.2 10*3/uL — ABNORMAL HIGH (ref 1.4–6.5)
PLATELETS: 180 10*3/uL (ref 150–440)
RBC: 5.48 10*6/uL (ref 4.40–5.90)
RDW: 19.7 % — ABNORMAL HIGH (ref 11.5–14.5)
WBC: 10.8 10*3/uL — ABNORMAL HIGH (ref 3.8–10.6)

## 2014-12-23 LAB — CK-MB: CK-MB: 4.2 ng/mL

## 2014-12-23 LAB — TROPONIN I
Troponin-I: 0.03 ng/mL
Troponin-I: 0.03 ng/mL

## 2014-12-23 LAB — PROTIME-INR
INR: 1.1
PROTHROMBIN TIME: 13.9 s

## 2014-12-24 LAB — AMYLASE, BODY FLUID: AMYLASE, BODY FLUID: 80 U/L

## 2014-12-24 LAB — CBC WITH DIFFERENTIAL/PLATELET
BASOS PCT: 0.1 %
Basophil #: 0 10*3/uL (ref 0.0–0.1)
EOS PCT: 0 %
Eosinophil #: 0 10*3/uL (ref 0.0–0.7)
HCT: 39.6 % — ABNORMAL LOW (ref 40.0–52.0)
HGB: 12.4 g/dL — ABNORMAL LOW (ref 13.0–18.0)
LYMPHS PCT: 6.6 %
Lymphocyte #: 0.6 10*3/uL — ABNORMAL LOW (ref 1.0–3.6)
MCH: 24.1 pg — AB (ref 26.0–34.0)
MCHC: 31.2 g/dL — ABNORMAL LOW (ref 32.0–36.0)
MCV: 77 fL — ABNORMAL LOW (ref 80–100)
Monocyte #: 0.1 x10 3/mm — ABNORMAL LOW (ref 0.2–1.0)
Monocyte %: 1.4 %
NEUTROS PCT: 91.9 %
Neutrophil #: 8.5 10*3/uL — ABNORMAL HIGH (ref 1.4–6.5)
Platelet: 150 10*3/uL (ref 150–440)
RBC: 5.12 10*6/uL (ref 4.40–5.90)
RDW: 19.7 % — ABNORMAL HIGH (ref 11.5–14.5)
WBC: 9.3 10*3/uL (ref 3.8–10.6)

## 2014-12-24 LAB — BASIC METABOLIC PANEL
Anion Gap: 8 (ref 7–16)
BUN: 33 mg/dL — ABNORMAL HIGH
CREATININE: 1.48 mg/dL — AB
Calcium, Total: 8.3 mg/dL — ABNORMAL LOW
Chloride: 105 mmol/L
Co2: 23 mmol/L
EGFR (African American): 54 — ABNORMAL LOW
GFR CALC NON AF AMER: 47 — AB
GLUCOSE: 163 mg/dL — AB
Potassium: 4.1 mmol/L
Sodium: 136 mmol/L

## 2014-12-24 LAB — ALBUMIN, FLUID (OTHER): Body Fluid Albumin: 1.3 g/dL

## 2014-12-24 LAB — LACTATE DEHYDROGENASE, PLEURAL OR PERITONEAL FLUID: LDH, Body Fluid: 44 U/L

## 2014-12-24 LAB — GLUCOSE, SEROUS FLUID: Glucose, Body Fluid: 194 mg/dL

## 2014-12-28 LAB — EXPECTORATED SPUTUM ASSESSMENT W REFEX TO RESP CULTURE

## 2014-12-28 LAB — BODY FLUID CULTURE

## 2015-01-02 NOTE — Consult Note (Signed)
Brief Consult Note: Diagnosis: Bilateral nephrolithiasis. Possible 36mm R ureteral calculus.   Patient was seen by consultant.   Consult note dictated.   Recommend further assessment or treatment.   Orders entered.   Discussed with Attending MD.   Comments: Patient would prefer trial of passage for the ureteral calculus rather than proceding with ureteroscopy/stent today. He will follow-up with Dr. Bernardo Heater this week.  Electronic Signatures: Royston Cowper (MD)  (Signed [phone] 12:04)  Authored: Brief Consult Note   Last Updated: 03-Nov-15 12:04 by Royston Cowper (MD)

## 2015-01-02 NOTE — Discharge Summary (Signed)
PATIENT NAME:  Collin [name], Collin [name] MR#:  [id number] DATE OF BIRTH:  1940/09/27  DATE OF ADMISSION:  07/10/2014 DATE OF DISCHARGE:  07/16/2014  ADMISSION DIAGNOSIS: Acute renal failure with severe hyperkalemia with a history of chronic kidney disease.   DISCHARGE DIAGNOSES:  1.  Acute on chronic renal failure stage III due to acute tubular necrosis, nonoliguric.  2.  Hyperkalemia from acute renal failure.   3.  Urinary tract infection.   4.  Essential hypertension.  5.  Anemia of chronic disease.    CONSULTATIONS: Dr. Juleen China from nephrology, Dr. Inez Pilgrim.    DISCHARGE LABORATORY DATA:  1.  Sodium 140, potassium 4.5, chloride 106, bicarbonate 28, BUN 31, creatinine 4.1, glucose is 97. White blood cells 12.6, hemoglobin 7.7, hematocrit 25, platelets are 269,000.  2.  Iron saturation is 13, serum iron is 21.    HOSPITAL COURSE: This is a 74 year old male who presented with generalized weakness, fatigue, and decreased oral intake, was found to have elevated creatinine. For further details, please refer to the H and P.   1.  Nonoliguric acute renal failure on chronic kidney disease stage III due to ATN.  I spoke with urology, they did not feel that kidney stone was causing acute renal failure, it was unclear if the patient actually had a kidney stone as he was asymptomatic. CT scan did show a stone, but ultrasound did not show stone. Dr. Juleen China feels this is likely ATN. He did require dialysis during his hospitalization, he may actually meet criteria for end-stage renal disease. Dr. Juleen China felt that patient was stable for discharge with close followup in his office and at that time they will evaluate for end-stage renal disease. He did not require dialysis the day of discharge. He is not complaining any shortness of breath, was not symptomatic. It is noted that his M spike was positive, hematology was consulted, further management will be handled as outpatient regarding the workup for this.  2.   Hyperkalemia from acute renal failure, status post emergent hemodialysis on admission. His potassium did improve with dialysis.  3.  Urinary tract infection. The patient was continued on Rocephin, his urine culture is negative to date.  4.  Blood culture initially was GPC, these were Staphylococcus epidermidis so it was a contaminant and vancomycin which had been started was discontinued.  5.  Essential hypertension. The patient will continue on atenolol for his blood pressure and will need close followup.    DISCHARGE MEDICATIONS:  1. Aspirin 81 mg daily.   2. Atenolol 25 mg daily.  3. Budesonide formoterol 2 puffs b.i.d.  4. Artificial Tears Plus as needed.  5. Nexium 40 mg daily.  6. Nitroglycerin sublingual p.r.n. chest pain.  7. Omega Essential 667 one tablet b.i.d.  8. ProAir 2 puffs 4 times a day p.r.n.  9. Valium 10 mg b.i.d. p.r.n.  10. Oxycodone 5 mg q. 6 hours p.r.n. pain.   DISCHARGE DIET: Low sodium renal diet.   DISCHARGE ACTIVITY: As tolerated.   FOLLOWUP:  The patient will follow up with Dr. Juleen China tomorrow for [facility] and further evaluation. He should also follow up with Dr. Inez Pilgrim in 1-2 weeks.   PHYSICAL EXAMINATION AT DISCHARGE:  VITAL SIGNS: Temperature 98.7, pulse 73, respirations 20, blood pressure 161/69, 93% on room air.  GENERAL: The patient is alert and oriented, not in acute distress.  OROPHARYNX: Clear.  CARDIOVASCULAR: Regular rhythm. No murmurs, gallops.  PMI was not displaced. LUNGS: Clear to auscultation without crackles, rales, rhonchi,  or wheezing. Normal to percussion.  ABDOMEN: Obese. Bowel, sounds are positive. Hard to appreciate organomegaly due to body habitus.  EXTREMITIES: No clubbing, cyanosis, or edema.   The patient was stable for discharge.   TIME SPENT: Approximately 35 minutes.      ____________________________ Donell Beers. Benjie Karvonen, MD spm:bu D: 07/16/2014 12:44:30 ET T: 07/16/2014 15:47:48 ET JOB#: [id number]  cc: Ryan Ogborn P. Benjie Karvonen, MD,  <Dictator> Donell Beers Comer Devins MD ELECTRONICALLY SIGNED 07/16/2014 16:51

## 2015-01-02 NOTE — Op Note (Signed)
PATIENT NAME:  Collin [name], Collin [name] MR#:  [id number] DATE OF BIRTH:  [birth year]  DATE OF PROCEDURE:  07/10/2014  PREOPERATIVE DIAGNOSES:  1.  Acute renal failure.  2.  Malignant hyperkalemia.  3.  Arrhythmia.   POSTOPERATIVE DIAGNOSES: 1.  Acute renal failure.  2.  Malignant hyperkalemia.  3.  Arrhythmia.   PROCEDURE PERFORMED: Insertion of temporary Trialysis catheter, right internal jugular with ultrasound guidance.   PROCEDURE PERFORMED BY: Katha Cabal, MD   PROCEDURE: The patient is in the Intensive Care Unit. He is critically ill. His right neck is prepped and draped in sterile fashion. Lidocaine 1% is infiltrated in the soft tissues of the neck. The ultrasound is placed in a sterile sleeve and the jugular vein is identified. It is echolucent and compressible indicating patency. Image is recorded for the permanent record. Under real-time visualization, Seldinger needle is inserted. Amplatz Super Stiff wire is advanced without difficulty. A small incision is made at the wire insertion site and the dilator is passed over the wire. Trialysis catheter is then fed without difficulty. All 3 lumens aspirate and flush easily. The catheter is secured to the neck with 2-0 nylon and a sterile dressing is applied. The patient tolerated the procedure well and there were no immediate complications.    ____________________________ Katha Cabal, MD ggs:ts D: 07/10/2014 18:01:48 ET T: 07/11/2014 01:25:58 ET JOB#: [id number]  cc: Katha Cabal, MD, <Dictator> Katha Cabal MD ELECTRONICALLY SIGNED 07/15/2014 11:46

## 2015-01-02 NOTE — Consult Note (Signed)
PATIENT NAME:  Collin [name], Collin [name] MR#:  [id number] DATE OF BIRTH:  1941-05-12  DATE OF CONSULTATION:  08/14/2014  CONSULTING PHYSICIAN:  Sherlynn Stalls, MD, urology.  REASON FOR CONSULTATION: Renal failure requiring hemodialysis, history of obstructing renal stone.   CONSULT REQUESTED BY: Tama High, MD, nephrology.   HISTORY OF PRESENT ILLNESS: Mr. Federici is a 74 year old male with stage 4 to 5 secondary to multiple ureteral issues including at ATN, UTIs, nephrolithiasis, essential hypertension. He was recently admitted on 10/30 with worsening renal failure and hyperkalemia requiring transient dialysis and placement of a permanent dialysis catheter. During that admission, he underwent a CT scan revealing severe right hydronephrosis, perinephric stranding, renal edema with an obstructing 3 mm right UVJ calculus. There is also bilateral intrarenal calculi noted. Urology, Dr. Yves Dill, was consulted that admission and elected not to stent the patient, instead recommending a trial of passage of the small stone. He ultimately was able to be discharged without further need for dialysis but was readmitted yesterday, 12/03 with elevated potassium requiring reinitiation of dialysis today. His creatinine on admission was 8.23, up from discharge on previous admission at 4.15. He denies any fevers, chills or flank pain. He denies any urinary symptoms and has been voiding well. He does make urine, approximately 3 to 4 voids each day of a fairly decent amount. He does have a history of stone disease and multiple stone procedures, previously managed by Dr. Bernardo Heater. He most recently underwent left ureteroscopy for a ureteral stone by Dr. Bernardo Heater in April of 2015.   PAST MEDICAL HISTORY:  1. 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  2. Hyperkalemia secondary to acute renal failure.  3. UTI.  4. Hypertension.  5. History of nephrolithiasis.  6. Anemia of chronic disease.   HOME MEDICATIONS:  1. Aspirin 81 mg.  2. Artificial tears.   3. Atenolol 25 mg daily.  4. Budesonide formoterol 2 puffs b.i.d. inhaled.  5. Nexium 40 mg daily.  6. Nitrostat 0.4 mg sublingual every 5 minutes p.r.n.   7. Omega Essential capsule b.i.d.  8. Oxycodone 5 mg 1 tab every 6 hours p.r.n.  9. ProAir HFA 90 mcg per inhalation 2 puffs 4 times a day as needed.  10. Valium 10 mg b.i.d.   SOCIAL HISTORY: Lives at home. He is a former recent smoker, but quit approximately 2 months ago. No other drugs.   FAMILY HISTORY: Noncontributory other than for hypertension.   REVIEW OF SYSTEMS:   CONSTITUTIONAL: Complaints of overall weakness and weight loss.  HEENT: No blurred vision or double vision. No rhinorrhea or hearing loss.  RESPIRATION: No cough, wheeze.  CARDIOVASCULAR: No chest pain, shortness of breath.  GASTROINTESTINAL: He does complain of nausea without vomiting. No diarrhea or constipation.  GENITOURINARY: As per HPI. No dysuria or hematuria.  ENDOCRINE: No polyuria, nocturia or skin changes.  HEMATOLOGY: No easy bruising or bleeding.  SKIN: No rash or lesions.  MUSCULOSKELETAL: Positive for chronic back pain.  NEUROVASCULAR: No CVA, TIA or dementia. No lower extremity weakness.  PSYCHIATRIC: No anxiety or depression. All other review of systems negative other than as per HPI.   PHYSICAL EXAMINATION:  VITAL SIGNS: Temperature 97.8, pulse 68, blood pressure 136/92, respirations 18 and 98% on room air. Quantitative inputs and outputs not recorded.  GENERAL: No acute distress. Alert and oriented, sitting in hospital bed. Generalized pallor noted. Conversive and interactive.  HEENT: Normocephalic, atraumatic. Moist mucous membranes. Poor dental hygiene.  NECK: Supple.  RESPIRATIONS: No increased work of  breathing or retractions.  CARDIOVASCULAR: No lower extremity edema bilaterally. No clubbing, cyanosis or edema.  ABDOMEN: Soft, nontender, nondistended. No CVA tenderness bilaterally.   NEUROLOGIC: Cranial nerves grossly intact. No  motor or sensory deficits.  PSYCHIATRIC: Alert and oriented x 3.   LABORATORY DATA: Most recent BMP: Sodium 136, potassium 5.7, chloride 104, CO2 of 16, BUN 130, creatinine 8.29. WBC 12.6, hemoglobin 7.6, hematocrit 24.6, platelets 269,000. No UA or urine culture obtained.   RADIOLOGY: CT scan from 07/11/2014 reviewed which shows severe hydronephrosis with 3 mm right UPJ stone obstructing. Followup renal ultrasound requested at the time of consult, which shows persistent right severe hydronephrosis with dilation of the renal pelvis. No left-sided hydronephrosis.   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 Followup renal ultrasound today confirms persistent right hydronephrosis concerning for ongoing obstruction. I discussed with Dr. Holley Raring, as well as the patient, that this may be contributing to the worsening renal function and recommend ureteral stent placement to decompress the right kidney. We discussed that this may or may not improve overall renal function; however, this certainly should be performed in order to eliminate unilateral obstruction as a contributing factor to his now worsening renal function. Unfortunately, the patient has already eaten today and given that this is not an emergent procedure, would prefer to perform right ureteral stent placement on an urgent basis rather than emergent basis. I have requested that the patient be consented and made n.p.o. at midnight. I will discuss this patient with my colleague, Dr. Randa Spike and arrange for right ureteral stent placement in the morning.   Thank you for allowing me to participate in the care of this patient. Please call urology with any questions or concerns.    ____________________________ Sherlynn Stalls,  MD ajb:TT D: 08/14/2014 16:43:45 ET T: 08/14/2014 18:55:55 ET JOB#: [id number]  cc: Sherlynn Stalls, MD, <Dictator> Sherlynn Stalls MD ELECTRONICALLY SIGNED 08/26/2014 10:57

## 2015-01-02 NOTE — Op Note (Signed)
Patient: This 74 year old M had a surgical procedure performed on 15-Aug-2014.  Post Operative Report:  Pre-Op Diagnosis right hydronephrosis   Post-Op Diagnosis Same   Operation cystoscopy, right retrograde pyelography, right ureteral stent placement   Anesthesia General   Specimen Type None   Findings right hydronephrosis   Surgeon Brallan Denio   EBL: None   Complications None   Description of Procedure: the patient was taken to the operating room, placed on the operating room table in the supine position.  General anesthesia was administered.  He was placed in the lithotomy position.  Antibiotics were administered.  His genitals were prepped and draped in the usual sterile fashion.  A 21 Fr rigid cystoscope was advanced into the bladder.  The bladder was surveyed and there were no tumors noted.  The ureteral orifices were in their orthotopic position.  A sensor wire was advanced into the right ureter.  A 5 Fr open ended ureteral catheter was advanced over the wire and the wire was withdrawn.  A retrograde pyelogram was performed and demonstrated hydronephrosis, but i could not identify a filling defect consistent with a stone.  The wire was then advanced into the collecting system of the kidney and the open ended catheter was removed.  A 28 cm x 6 Fr right ureteral stent was then placed over the wire with appropriate coils noted in the collecting system of the kidney as well as within the bladder.  the string was removed.  The bladder was then drained and the patient was awakened and taken to the PACU in stable condition.   Electronic Signatures: Burnard Hawthorne (MD)  (Signed 05-Dec-15 10:45)  Authored: Patient and Date/Time, Operative Note   Last Updated: 05-Dec-15 10:45 by Burnard Hawthorne (MD)

## 2015-01-02 NOTE — Consult Note (Signed)
PATIENT NAME:  Collin [name], Collin [name] MR#:  [id number] DATE OF BIRTH:  Jan 12, 1941  DATE OF CONSULTATION:  07/14/2014  REFERRING PHYSICIAN:   CONSULTING PHYSICIAN:  Otelia Limes. Yves Dill, MD  REFERRING PHYSICIAN:  Bettey Costa, M.D.   REASON FOR CONSULTATION: Kidney stones.   HISTORY OF PRESENT ILLNESS:  Collin [name] is a 74 year old Caucasian male admitted to the hospital with decreased oral intake, weakness and fatigue. He was found to have azotemia. Evaluation included renal ultrasound on October 30th which indicated a 21 mm calcification in the right upper pole with upper pole collecting system dilatation. The left kidney also had an 11 mm stone in the lower pole. The patient reports having had right flank pain several weeks ago. He does have a history of kidney stones and is followed by Collin [name]. He apparently underwent ureteroscopy for a left ureteral stone in April of this year per Collin [name]. The patient specifically denies flank pain at this time.   ALLERGIES: No known drug allergies.   CHRONIC HOME MEDICATIONS: Included Valium, ProAir HFA, omega-3 essentials, Nitrostat, Nexium, Symbicort, atenolol and aspirin.   PAST SURGICAL HISTORY: Included:  1.  Aortic aneurysm repair. 2.  Ureteroscopic ureterolithotomy in 2015. 3.  Repair of spinal stenosis.   PAST AND CURRENT MEDICAL CONDITIONS:  1.  Asthma.  2.  Hypertension.  3.  History of aortic aneurysm. 4.  Spinal stenosis.  5.  Anxiety.   REVIEW OF SYSTEMS: The patient denied gross hematuria or flank pain. The patient denied history of BPH or difficulty voiding.   PHYSICAL EXAMINATION: Deferred.   DATA: CT scan and ultrasound scan reports were reviewed.   PERTINENT LABORATORY STUDIES:  Include BUN of 52 and creatinine of 5.44.   IMPRESSION:  1.  Bilateral nephrolithiasis.  2.  Possible 3 mm right ureteropelvic junction stone.  3.  Azotemia.   SUGGESTIONS: 1.  Discussed the possibility of proceeding with right ureteroscopy versus trial  of passage. The patient would prefer trial of passage. 2.  Follow up with Collin [name] after discharge this week for further followup.   ____________________________ Otelia Limes. Yves Dill, MD mrw:jw D: 07/14/2014 12:00:34 ET T: 07/14/2014 14:37:03 ET JOB#: [id number]  cc: Otelia Limes. Yves Dill, MD, <Dictator> Royston Cowper MD ELECTRONICALLY SIGNED 07/14/2014 15:19

## 2015-01-02 NOTE — Consult Note (Signed)
Details:   - GI Note:  Refused to stay NPO for EGD.   Barium swallow shows extrensic compression from cardiomegaly.  Would have recommended EGD to r/o other causes of dysphagia.   Recs: - mechanical soft diet.  - EGD if / when pt wants to proceed.   Electronic Signatures: Arther Dames (MD)  (Signed 07-Dec-15 17:01)  Authored: Details   Last Updated: 07-Dec-15 17:01 by Arther Dames (MD)

## 2015-01-02 NOTE — H&P (Signed)
PATIENT NAME:  Collin [name], Collin [name] MR#:  [id number] DATE OF BIRTH:  [birth year]  DATE OF ADMISSION:  08/13/2014  PRESENTING COMPLAINT: Elevated potassium.   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 Delana Meyer. The patient comes in as a direct admit from Dr. Assunta Gambles office after he was found to have elevated potassium and severely low creatinine clearance. He is going to be started on hemodialysis secondary to his end-stage renal failure. The patient currently is complaining of chronic back pain. He also has some dental pain which he was mentioning it to the RN earlier. Denies any shortness of breath or chest pain. Complains of some nausea and has been having hiccups on and off for 2-4 days.   PAST MEDICAL HISTORY:  1.  CKD stage IV-V.  2.  Hyperkalemia from acute renal failure recently started on dialysis which was temporary, however does have a permanent dialysis catheter.  3.  UTI.   4.  Hypertension.  5.  Anemia of chronic disease.   HOME MEDICATIONS:   1. Aspirin 81 mg daily.  2. Artificial Tears Plus.  3. Atenolol 25 mg daily.  4. Budesonide formoterol 2 puffs b.i.d. inhalation b.i.d.  5. Nexium 40 mg daily.  6. Nitrostat 0.4 mg 1 sublingual every 5 minutes as needed.  7. Omega Essentials 1 capsule b.i.d.  8. Oxycodone 5 mg 1 tablet every 6 hours as needed.  9. ProAir HFA 90 mcg per inhalation 2 puffs 4 times a day as needed.  10. Valium 10 mg b.i.d.   ALLERGIES: No known drug allergies.   SOCIAL HISTORY: Lives at home with wife. Ex-smoker, quit about 2 months ago.   FAMILY HISTORY: No kidney problems in his parents. Positive  for hypertension.   REVIEW OF SYSTEMS:   CONSTITUTIONAL: Positive for fatigue, weakness, and weight loss.  EYES: No blurred or double vision no redness. ,  EARS, NOSE, AND THROAT: No tinnitus, ear pain, or hearing loss.  RESPIRATORY: No cough, wheeze, hemoptysis. Positive for some shortness of breath.  CARDIOVASCULAR: No chest pain, orthopnea, edema. Positive for hypertension.  GASTROINTESTINAL: Positive for nausea and hiccups.  No diarrhea or abdominal pain.  GENITOURINARY: No dysuria, hematuria. Positive for significant decreased urine output. ENDOCRINE: No polyuria, nocturia, or thyroid problems.  HEMATOLOGY: Positive for chronic anemia. No easy bruising. No bleeding.  SKIN: No acne, rash, or lesion.  MUSCULOSKELETAL: Has chronic back pain.  NEUROLOGIC: No CVA, TIA, dysarthria, or dementia. Has generalized weakness.   PSYCHIATRIC:  No  anxiety or depression.   All other systems reviewed and negative.   PHYSICAL EXAMINATION:   GENERAL:  The patient is awake, alert, oriented x 3, not in acute distress.  VITAL SIGNS: Afebrile, pulse is 72, blood pressure is 124/86, saturations are 98% on room air. HEENT: Atraumatic, normocephalic. Pupils PERRLA.  EOM intact. Oral mucosa is moist. Poor dental hygiene.  NECK: Supple. No JVD. No carotid bruits.  RESPIRATORY: Clear to auscultation bilaterally. No rales, rhonchi, respiratory distress, or labored breathing.  CARDIOVASCULAR: Both heart sounds are normal. Rate and rhythm regular. PMI not lateralized. Chest nontender. Good pedal pulses. Good femoral pulses. No lower extremity edema.  ABDOMEN:  Soft, benign, nontender.  No organomegaly. Positive bowel sounds.  NEUROLOGIC:  Grossly intact cranial nerves II through XII. No motor or sensory deficit.  PSYCHIATRIC:  The patient is awake, alert, oriented x 3.   LABORATORY DATA:  Outpatient laboratories are not available, however the patient's potassium is 6.6 per Dr. Holley Raring. EKG is pending.    ASSESSMENT:  A 74 year old, Collin [name], with history of chronic kidney disease stage III-IV, comes in with:   1.  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 The patient already has a permanent dialysis catheter. He is going to be dialyzed from tomorrow per Dr. Holley Raring. The case was discussed with Dr. Holley Raring. We will give him 1 amp of IV D50 along with insulin lispro IV and calcium and Kayexalate. We will repeat METB and potassium more than 5.5 we will repeat above treatment.  2.  Kidney disease stage III-IV now progressing towards end-stage renal disease, it seems like it is progressing to end-stage renal disease. The patient is going to be started on hemodialysis per Dr. Holley Raring from tomorrow. He has a permanent dialysis catheter.  3.  Chronic back pain with spinal stenosis. Continue Valium and oxycodone.  4.  Gastroesophageal reflux disease. Continue PPI.  5.  Hypertension, on atenolol. Will resume it.   6.  Deep vein thrombosis prophylaxis.  On subcutaneous heparin t.i.d.  7.  Further workup according to the patient's clinical course. Hospital admission plan was discussed with the patient, no family members present.   TIME SPENT: 50 minutes.   Case was discussed with Dr. Holley Raring.      ____________________________ Hart Rochester. Posey Pronto, MD sap:bu D: 08/13/2014 19:55:08 ET T: 08/13/2014 20:08:31 ET JOB#: [id number]  cc: Collin Farabee A. Posey Pronto, MD, <Dictator> Ilda Basset MD ELECTRONICALLY SIGNED 09/04/2014 17:00

## 2015-01-02 NOTE — Consult Note (Signed)
Brief Consult Note: Diagnosis: arf, anemia, momoclonal gammopathy.   Patient was seen by consultant.   Consult note dictated.   Orders entered.   Comments: SEE DICTATED NOTE. WILL F/U ELECTROPHERESIS. MAY NEED 24 HRS URINE FOR UIEP. WILL CHECK FOR OTHER CAUSES OF ASNEMIA.  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  Electronic Signatures: Dallas Schimke (MD)  (Signed [phone] 23:49)  Authored: Brief Consult Note   Last Updated: 04-Nov-15 23:49 by Dallas Schimke (MD)

## 2015-01-02 NOTE — Op Note (Signed)
PATIENT NAME:  Collin [name], FILL MR#:  [id number] DATE OF BIRTH:  03/07/1941  DATE OF PROCEDURE:  08/18/2014  PREOPERATIVE DIAGNOSIS: Complication of dialysis catheter.   POSTOPERATIVE DIAGNOSIS: Complication of dialysis catheter.   PROCEDURE PERFORMED: Removal of cuffed tunneled dialysis catheter.   SURGEON: Katha Cabal, MD.   ANESTHESIA: Lidocaine local infiltration.   DESCRIPTION OF PROCEDURE: The patient is in his hospital room in his bed. He is positioned supine. Catheter in neck, chest wall are prepped and draped in a sterile fashion. Lidocaine 1% is infiltrated in the soft tissues surrounding the cuff, which is easily palpable. A small incision is made with an 11 blade and carried down to expose the catheter, which was freed from the surrounding adhesions. The catheter is then removed without difficulty and 4-0 Monocryl is used to close the tract and subsequently 4-0 Monocryl subcuticular is used to close the skin. Dermabond is applied. Antibiotic ointment and a bandage are applied to the exit site. The patient tolerated the procedure well and there were no immediate complications. Sponge and needle counts were correct.      ____________________________ Katha Cabal, MD ggs:TT D: 08/21/2014 11:00:36 ET T: 08/21/2014 17:41:19 ET JOB#: [id number]  cc: Katha Cabal, MD, <Dictator> Katha Cabal MD ELECTRONICALLY SIGNED 08/25/2014 16:07

## 2015-01-02 NOTE — Consult Note (Signed)
Brief Consult Note: Diagnosis: acute renal failure with hyperkalemia.   Patient was seen by consultant.   Comments: will plan temp cath now for emergent dialysis and then will discuss permanet access in several days.  Electronic Signatures: Hortencia Pilar (MD)  (Signed 30-Oct-15 18:32)  Authored: Brief Consult Note   Last Updated: 30-Oct-15 18:32 by Hortencia Pilar (MD)

## 2015-01-02 NOTE — H&P (Signed)
PATIENT NAME:  Collin [name], DOOLY MR#:  [id number] DATE OF BIRTH:  Feb 06, 1941  DATE OF ADMISSION:  07/10/2014  CHIEF COMPLAINT: Generalized weakness, fatigue, and decreased oral intake.   HISTORY OF PRESENTING ILLNESS: A 74 year old male patient with history of hypertension, asthma, abdominal aortic aneurysm repair, ureteral calculi in the past, who presents to the Emergency Room, brought in by his cousin after he noticed that he was generally weak with decreased oral intake for the past 2 weeks. Also initially in triage, dragging of right leg was mentioned, although the patient denies this. He mentions he has been lying mostly in bed and getting out of the bed very rarely decreased urine output. No recent change in medications. The patient does not use any NSAIDs. Does not complain of any abdominal pain, nausea, vomiting, diarrhea, but does have decreased intake, decreased urine output. Today in the Emergency Room. The patient's creatinine has been found to be 12.63. His potassium was 7.6, bicarbonate 12. Also his white count is elevated at 26.8 and hemoglobin 11.4.   The patient has received IV Lasix, IV insulin, D50, and calcium gluconate here in the Emergency Room.   PAST MEDICAL HISTORY:  1. Anxiety.  2. Asthma.  3. Hypertension.  4. Abdominal aortic aneurysm repair.  5. Ureteral calculi.  6. Spinal stenosis.   HOME MEDICATIONS:  1. Valium 10 mg oral 2 times a day.  2. Pro-Air HFA 2 puffs inhaled 4 times a day as needed. 3. Omega Essentials 1 capsule oral 2 times a day.  4. Nitrostat 0.4 mg sublingual as needed for chest pain..  5. Nexium 40 mg daily.  6. Symbicort 2 puffs inhaled 2 times a day.  7. Atenolol 25 mg daily.  8. Aspirin 81 mg daily.   ALLERGIES: No known drug allergies.   FAMILY HISTORY: No kidney problems in his parents.   REVIEW OF SYSTEMS:  CONSTITUTIONAL: Complains of fatigue, weakness, weight loss.  EYES: No blurred vision, pain, or redness.  EARS, NOSE AND THROAT:  No tinnitus, ear pain, or hearing loss.  RESPIRATORY: No cough, wheeze, hemoptysis.  CARDIOVASCULAR: No chest pain, orthopnea, edema. GASTROINTESTINAL: No nausea, vomiting, diarrhea, abdominal pain.  GENITOURINARY: No dysuria. Has decreased urine output.   ENDOCRINE: No polyuria, nocturia, thyroid problems. HEMATOLOGIC AND LYMPHATIC: No anemia, easy bruising, bleeding.  INTEGUMENTARY: No acne, rash, lesion.  MUSCULOSKELETAL: Has chronic back pain.  NEUROLOGICAL: No focal numbness. Has generalized weakness.  PSYCHIATRIC: No anxiety or depression.   PHYSICAL EXAMINATION:  VITAL SIGNS: Pulse 83, respirations 18, blood pressure 146/81, saturating 98% on room air.  GENERAL: Obese, Caucasian male patient lying in bed, fatigued.  PSYCHIATRIC: Alert and oriented x 3, flat affect. HEENT: Atraumatic, normocephalic. Oral mucosa dry and pink. External ears and nose normal. Pallor positive. No icterus. Pupils bilaterally equal and reactive to light.  NECK: Supple. No thyromegaly. No palpable lymph nodes.  CARDIOVASCULAR: S1, S2, without any murmurs.  RESPIRATORY: Normal work of breathing. Clear to auscultation on both sides.Marland Kitchen  GASTROINTESTINAL: Soft abdomen, nontender. Bowel sounds present.  GENITOURINARY: No significant bladder distention.  SKIN: Warm and dry. Has some peeling of skin on his palms.Marland Kitchen  NEUROLOGICAL: Motor strength 5 out of 5 in upper and lower extremities. LYMPHATICS: No cervical lymphadenopathy.  LABORATORY STUDIES: Show glucose 108, BUN 150, creatinine 12.6, sodium 128, potassium 7.6, chloride 103, bicarbonate of 12. AST, ALT, alkaline phosphatase, bilirubin normal. Troponin less than 0.02. WBC 26.8, hemoglobin 11.4, platelets of 209. Urinalysis shows trace bacteria, 51 WBC.  CT scan of the head without contrast shows nothing acute.   Chest x-ray is clear.   EKG shows normal sinus rhythm with increased PR interval at 212. No tall T waves. Normal QT and QRS. No ST elevation.    ASSESSMENT AND PLAN:  1. Acute renal failure with severe hypokalemia in a patient with a history of chronic kidney disease stage III with baseline creatinine around 1.8. This is likely secondary from decreased oral intake. We will have to check ultrasound of the kidney, ureter, bladder to rule out any obstruction with his history of ureteral calculus. At this point, he does not complain of any abdominal pain. We will give him 2 liters of normal saline bolus STAT along with starting him on bicarbonate drip. Discussed the case with Dr. Holley Raring who will be taking the patient emergently for dialysis at this point. He is high risk for cardiac arrest and death. We will also check a CK level. Avoid nephrotoxic medications. Repeat a STAT BMP at this point. Further management as per nephrology recommendations.  2. Generalized weakness secondary to acute renal failure, dehydration. CT head shows nothing acute. Consult PT.  3. Leukocytosis. The patient does seem to have a urinary tract infection with trace bacteria and 51 WBCs. We will start him on ceftriaxone and send for blood cultures, urine cultures. Chest x-ray is clear. Await culture results.  4. Hypotension. We will hold his atenolol at this point, use IV p.r.n. medications.  5. Asthma, p.r.n. inhaler. 6. Deep vein thrombosis prophylaxis with heparin.   CODE STATUS: Full code.   TIME SPENT: Critical care time on this patient was 40 minutes.   ____________________________ Leia Alf Shay Bartoli, MD srs:jh D: 07/10/2014 11:05:45 ET T: 07/10/2014 11:51:17 ET JOB#: [id number]  cc: Alveta Heimlich R. Darvin Neighbours, MD, <Dictator> Neita Carp MD ELECTRONICALLY SIGNED 07/16/2014 14:47

## 2015-01-02 NOTE — Consult Note (Signed)
PATIENT NAME:  Collin [name], CUBIT MR#:  [id number] DATE OF BIRTH:  10-Nov-1940  DATE OF CONSULTATION:  07/15/2014  CONSULTING PHYSICIAN:  Simonne Come. Gittin, MD  HISTORY OF PRESENT ILLNESS:  Collin [name] is a 74 year old man who was admitted on October 30 with elevated creatinine and acute renal failure. Potassium was 7.6. Hemoglobin was 12. He had an elevated white count of 26.8. Hemoglobin was 11.4. He had a high glucose. He was given insulin, Lasix, and calcium. He had had a period of general weakness and decreased oral intake for a few days. His initial diagnosis was acute renal failure, etiology unknown, baseline 1.8. It looked initially like dehydration. Ultrasound of the kidneys showed right hydronephrosis. He was given insulin and glucose to bring down his potassium. He did not present with diabetes. He had an evaluation by nephrology for emergency dialysis. He had leukocytosis and urinary tract infection. He was started on ceftriaxone, and blood cultures were done. He was hypotensive, so atenolol was held.   Following admission and hydration, his hemoglobin fell to 9 grams. Platelets fell to 145,000. Later, platelet count was repeated and is back up to 245,000. He appears to have unmasked anemia with hydration. He has had a catheter placed for dialysis. He has been on Rocephin. He has had positive blood cultures that were interpreted as being a contaminant, so he was briefly given but then vancomycin was discontinued. He had urine culture negative, although urinalysis was positive for white cells and red cells. He has had multiple serologies so far unrevealing for the cause of renal failure. He had a urine immunoelectrophoresis that showed a very low level of Bence-Jones protein. He had a serum electrophoresis that showed a 400 mg spike. Hematology is consulted for M spike anemia and renal failure.   PAST MEDICAL HISTORY:  Abdominal aneurysm repair, kidney stones, spinal stenosis, hypertension, asthma, and  anxiety.   HOME MEDICATIONS:  Valium 10 mg twice a day, ProAir inhaler, Nitrostat p.r.n., Nexium 40 daily, Symbicort daily, atenolol 25 mg daily, aspirin 81 mg daily.   FAMILY HISTORY:  Unrevealing. His father appeared to have lung cancer.   SOCIAL HISTORY:  He smoked half a pack a day or less for as much as 50 years, quitting intermittently, often smoking much less. He has less than a 30-pack-year history.   ALLERGIES:  No known allergies.   REVIEW OF SYSTEMS:  He was feeling well. He initially had fatigue. He apparently endured some weight loss on admission. When I spoke to him, he had no headache or dizziness. No ear or jaw pain. No pharyngitis or dysphagia. No abdominal pain, nausea, vomiting, or diarrhea. No rash or bruising. No focal weakness. No numbness in any of extremities. He has had some back pain that is chronic and has a diagnosis of spinal stenosis. He denies any anxiety or depression.   PHYSICAL EXAMINATION: VITAL SIGNS:  Stable.  GENERAL:  He is alert and cooperative. He has a line in the neck, and he has a catheter newly placed in the shoulder.  HEENT:  Sclerae:  No jaundice.  MOUTH:  No thrush.  LYMPHATIC:  No palpable lymph nodes  LUNGS:  Clear without wheezing or rales.   HEART:  Regular.  ABDOMEN:  Benign, nontender. No palpable mass or organomegaly. EXTREMITIES:  The lower extremities show no edema. Strength is symmetric. I did not test his gait.   LABORATORY DATA:  Creatinine has been as high as 12.5. With dialysis, though, electrolytes are controlled. As  noted, the initial white count was 26,000, hemoglobin 11.4 and then down to 9. Platelets were 209,000, down to 145,000, and then back to 245,000. Urinalysis was abnormal with 50 white cells and the urine culture was later negative.   IMAGING:  CT scan of the abdomen and pelvis showed right hydronephrosis. No other pathologies.   IMPRESSION:  From a hematology point of view, the patient has a low level monoclonal  gammopathy. His serum free light chains are still pending, but with the urine spike and extrapolating, he would appear not to have a high load of light chains. His electrophoresis had a low level M spike of 400. We do not know if it is IgG, IgM, or IgA, so for unexplained renal failure, it is extremely unlikely that this represents myeloma. I would not be recommending bone marrow at this time. I would be recommended checking a serum immunoelectrophoresis, getting light chains, and then getting a 24-hour urine for light chain load. The patient has anemia that is compatible with renal failure  We will check reticulocytes. There is no suspicion of  TTP  with a normal platelet count. Liver chemistries were otherwise normal. So far, we have not ruled out hemolysis or iron deficiency or B12. As stated, his abdominal CT was unremarkable.   SUGGESTION:  Document serum immunoelectrophoresis. Document the light chains. Follow up the CBC. Document the reticulocytes. Document the B12 level. Could check iron studies, but if serum iron is low, this would be spurious from acute infection. If serum iron is normal at this time, that would rule out iron deficiency; otherwise, we would want to later check serum iron studies and stool guaiacs. Continue to follow. Will continue to follow nephrology's evaluation and diagnosis.    ____________________________ Simonne Come Inez Pilgrim, MD rgg:nb D: 07/15/2014 45:03:88 ET T: 07/16/2014 01:14:33 ET JOB#: [id number]  cc: Simonne Come. Inez Pilgrim, MD, <Dictator> Dallas Schimke MD ELECTRONICALLY SIGNED 08/16/2014 21:56

## 2015-01-02 NOTE — Consult Note (Signed)
Chief Complaint:  Subjective/Chief Complaint creatinine continues to trend down and status post ureteral stent placement on 08/15/2014 by Dr. Randa Spike.  Creatinine 3.65 totday.  Voiding well.  Urine clear today.   VITAL SIGNS/ANCILLARY NOTES: **Vital Signs.:   08-Dec-15 07:59  Vital Signs Type Q 8hr  Temperature Temperature (F) 97.9  Celsius 36.6  Temperature Source oral  Pulse Pulse 81  Respirations Respirations 18  Systolic BP Systolic BP 161  Diastolic BP (mmHg) Diastolic BP (mmHg) 76  Mean BP 100  Pulse Ox % Pulse Ox % 98  Pulse Ox Activity Level  At rest  Oxygen Delivery Room Air/ 21 %  *Intake and Output.:   Shift 08-Dec-15 15:00  Grand Totals Intake:  240 Output:  650    Net:  -410 52 Hr.:  -410  Oral Intake      In:  240  IV (Primary)      In:  0  Urine ml     Out:  650  Length of Stay Totals Intake:  3173 Output:  7500    Net:  -4327   Brief Assessment:  GEN well developed, well nourished, no acute distress   Cardiac Regular   Respiratory normal resp effort   Gastrointestinal Normal   Gastrointestinal details normal Soft   Additional Physical Exam voided urine in urinal, clear   Lab Results: Hepatic:  08-Dec-15 05:00   Albumin, Serum  2.9  Routine Chem:  08-Dec-15 05:00   Glucose, Serum  104  BUN  56  Creatinine (comp)  3.65  Sodium, Serum 137  Potassium, Serum 4.5  Chloride, Serum 103  CO2, Serum 26  Calcium (Total), Serum  8.2  Phosphorus, Serum 2.8  Anion Gap 8  Osmolality (calc) 290  eGFR (African American)  21  eGFR (Non-African American)  18 (eGFR values <44mL/min/1.73 m2 may be an indication of chronic kidney disease (CKD). Calculated eGFR, using the MRDR Study equation, is useful in  patients with stable renal function. The eGFR calculation will not be reliable in acutely ill patients when serum creatinine is changing rapidly. It is not useful in patients on dialysis. The eGFR calculation may not be applicable to patients at the  low and high extremes of body sizes, pregnant women, and vegetarians.)   Assessment/Plan:  Assessment/Plan:  Assessment 74 year old male  with acute on chronic renal failure POD 2 status post right ureteral stent placement.  Creatinine continues to trend down adjusting that obstruction was likely contributing to his worsening renal function.  no need for further dialysis.  We briefly discussed need for definitive management of this obstructing UPJ stone. -Will arrange for outpatient follow-up   Electronic Signatures: Sherlynn Stalls (MD)  (Signed 08-Dec-15 13:37)  Authored: Chief Complaint, VITAL SIGNS/ANCILLARY NOTES, Brief Assessment, Lab Results, Assessment/Plan   Last Updated: 08-Dec-15 13:37 by Sherlynn Stalls (MD)

## 2015-01-02 NOTE — Discharge Summary (Signed)
PATIENT NAME:  Collin [name], PALARDY MR#:  [id number] DATE OF BIRTH:  [birth year]  DATE OF ADMISSION:  08/13/2014 DATE OF DISCHARGE:  08/18/2014  ADMITTING DIAGNOSES:  1.  Hyperkalemia. Felt to be due to possible acute on chronic renal failure, as well as severe right-sided hydronephrosis. The patient requiring hemodialysis transiently during hospitalization. No further need for hemodialysis.  2.  Right-sided hydronephrosis, status post cystoscopy, right retrograde pyelography, right ureteral stent placement.  3.  Dysphagia, possibly due to compression of the heart by the esophagus. He will need outpatient followup for a possible EGD. His dysphagia improved.  4.  Chronic back pain.  5.  Gastroesophageal reflux disease.  6.  Hypertension.   CONSULTANTS DURING HOSPITALIZATION: Dr. Holley Raring, Dr. Hollice Espy, Dr. Lenoria Chime, Dr. Juleen China.   Obstructing UPJ stone outpatient followup with urology.   PERTINENT LABORATORIES AND EVALUATIONS: Glucose 136, BUN 133, creatinine 8.23, sodium 137, potassium 5.6, chloride 104, CO2 was 17. WBC 11.1, hemoglobin 9.2, platelet count was 175,000. Urine cultures 1000 CFUs of gram-positive cocci. Echocardiogram: EF 40% to 45%, dilated to moderately decreased global left ventricular function, mildly dilated left atrium and right atrium, mildly elevated pulmonary artery pressures. On December 8, BUN 56, creatinine 3.65. Ultrasound of the kidneys showed a severe right-sided hydronephrosis, also renal calculi noted.   HOSPITAL COURSE: Please refer to H and P done by the admitting physician. 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 Assunta Gambles office after he was found  to have a severely elevated potassium and severely low creatinine clearance. The patient was seen and evaluated, had a repeat ultrasound which showed worsening right-sided hydronephrosis. Therefore, a urology consult was obtained. He underwent a stent as described above. The patient was dialyzed during the hospitalization. Once he had the stent placed, his renal function started improving significantly. His creatinine is much improved. It was decided to hold dialysis. The patient's dialysis catheter has been removed. His creatinine is trending downwards. He will be followed closely by a nephrologist as an outpatient for further need for dialysis. He will also be seen by Dr. Hollice Espy of urology for the renal stone that he has. The patient also was complaining of dysphagia during hospitalization, underwent a barium study. It showed mild to moderate mass effect on the distal thoracic esophagus related to extrinsic compression due to cardiomegaly. The patient was seen by Dr. Thurmond Butts who recommended if he chose he could have an elective EGD to make sure there is nothing else going on. The patient's swallowing is improved on discharge. He is currently doing well and is stable for discharge.   DISCHARGE MEDICATIONS: Aspirin 81 mg 1 tab p.o. daily, atenolol 25 p.o. daily, budesonide 2 puffs b.i.d., artificial tears as needed, ProAir 2 puffs 4 times a day as needed, Valium 10 mg 1 tablet p.o. b.i.d. as needed, Nexium 40 daily, oxycodone 5 mg q. 6 p.r.n. for pain.   DIET: Low-sodium, low-fat, low-cholesterol.   ACTIVITY: As tolerated.   DISCHARGE FOLLOWUP: Follow up with primary MD in 1 to 2 weeks, in 2 to 4 weeks with Dr. Erlene Quan of urology, Dr. Juleen China in 2 to 4 weeks.   TIME SPENT: 35 minutes on this patient.  ____________________________ Lafonda Mosses Posey Pronto, MD shp:at D: 08/19/2014 11:37:43 ET T: 08/19/2014 15:17:59 ET JOB#: [id number]  cc: Minor Iden H. Posey Pronto, MD, <Dictator> Alric Seton  MD ELECTRONICALLY SIGNED 08/21/2014 17:12

## 2015-01-02 NOTE — Op Note (Signed)
PATIENT NAME:  Collin [name], Collin [name] MR#:  [id number] DATE OF BIRTH:  22-Jul-1941  DATE OF PROCEDURE:  07/15/2014  PREOPERATIVE DIAGNOSIS: End-stage renal disease, requiring hemodialysis.   POSTOPERATIVE DIAGNOSIS: End-stage renal disease, requiring hemodialysis.   PROCEDURE PERFORMED: Insertion of left internal jugular cuffed tunneled dialysis catheter with ultrasound and fluoroscopic guidance.   SURGEON: Katha Cabal, MD   SEDATION:  Versed 4 mg plus fentanyl 150 mcg administered IV. Continuous ECG, pulse oximetry and cardiopulmonary monitoring is performed throughout the entire procedure by the interventional radiology nurse. Total sedation time was 40 minutes.   ACCESS: Left internal jugular.    CONTRAST USED: None.   FLUOROSCOPY TIME: Less than 1 minute.   INDICATIONS: Collin [name] is a 74 year old gentleman who now is requiring dialysis and, therefore, will require more appropriate access, a cuffed tunneled dialysis catheter is being inserted. Risks and benefits are reviewed. The patient agrees to proceed.   DESCRIPTION OF PROCEDURE: The patient is taken to special procedures and placed in the supine position and his left neck is prepped and draped in a sterile fashion. Appropriate timeout is called.   Ultrasound is placed in a sterile sleeve. Jugular vein is identified. It is echolucent and compressible indicating patency. Image is recorded for the permanent record. Lidocaine 1% is infiltrated in the soft tissues of the chest, base of the neck. A micropuncture needle is inserted into the jugular under direct ultrasound visualization. Microwire is then advanced followed by placement of the sheath. The wire is then positioned under fluoroscopy so that the tip is in the mid atrium.  It is then marked and withdrawn and measured and a 27 tip to cuff catheter is selected.   J-wire is then advanced through the micro sheath, but this will not track into the atrium and therefore a 5 French sheath and  Kumpe catheter is advanced. The catheter and wire are then negotiated into the inferior vena cava and the wire is exchanged for a Magic torque wire. Wire and sheath are then removed and serial dilatation is performed. The peel-away sheath is inserted and the dilator is removed leaving the wire.  The 27 cm catheter, Cannon type,  is then advanced over the wire through the sheath and the sheath is removed. It is then positioned so that the distal tip is in the mid atrium and the wire is removed. Both lumens aspirate and flush easily.   The catheter is then approximated to the chest wall with the tips in proper position. A small exit site is made with an 11 blade scalpel. Tunneling device is passed subcutaneously, tract is dilated, and the catheter is pulled subcutaneously. It is then transected and the hub assembly is connected. Both lumens aspirate and flush easily, and under fluoroscopy, the catheter has a smooth contour with the tips in proper positions. Therefore, 5000 units of heparin is packed into each lumen. Air-proof caps are applied.   The catheter is secured to the chest wall with 0 silk suture and the neck counterincision is closed with a 4-0 Monocryl and then Dermabond. Sterile dressing is applied. The patient tolerated the procedure well and there were no immediate complications.    ____________________________ Katha Cabal, MD ggs:LT D: 07/15/2014 16:01:58 ET T: 07/15/2014 18:33:36 ET JOB#: [id number]  cc: Katha Cabal, MD, <Dictator> Katha Cabal MD ELECTRONICALLY SIGNED 07/22/2014 7:59

## 2015-01-02 NOTE — Op Note (Signed)
PATIENT NAME:  Collin [name], Collin [name] MR#:  [id number] DATE OF BIRTH:  [birth year]  DATE OF PROCEDURE:  12/17/2013  PREOPERATIVE DIAGNOSIS: Urethral calculus.   POSTOPERATIVE DIAGNOSIS: Urethral calculus.  PROCEDURE: Cystolitholapaxy.   SURGEON: John Giovanni, MD.   ASSISTANT: None.   ANESTHESIA: General.   INDICATIONS FOR PROCEDURE: A 74 year old male with a history of nephrolithiasis. He was seen in the Emergency Department complaining of frequency, urgency and sensation of inability to void. CT scan was performed which showed nephrolithiasis but no hydronephrosis. The patient was noted to have urinary retention. A Foley catheter was placed and according to the patient, the nursing staff commented that it felt like there was obstruction in the urethra. He failed one voiding trial requiring catheter replacement.  He was subsequently able to void with a repeat voiding trial. However, complains of urinary hesitancy, decreased force and caliber of his urinary stream and sensation of an obstruction in the urethra. Upon my review of the CT scan, there appeared to be a stone in the prosthetic urethra. Office cystoscopy was performed yesterday and he was noted to have an approximately 5 mm stone in the urethra. He presents for stone removal.   DESCRIPTION OF PROCEDURE: The patient was taken to the cystoscopy suite and placed on the table in the supine position. A general anesthetic was administered via an LMA. He was then placed in the low-lithotomy position and his external genitalia were prepped and draped in the usual fashion. A timeout was performed per protocol. The stone was palpable in the fossa navicularis. The meatus would not allow a 21-French cystoscope and the meatus was dilated with Leander Rams sounds to 54 [place]. The cystoscope was then easily passed. Stone had migrated back to the bulbar urethra. A 365 micron holmium laser fiber was then placed through the cystoscope. The stone was easily fragmented.  The fragments were then pushed back into the bladder and were removed by irrigation. Cystoscopy was performed and the ureteral orifices were normal-appearing with clear efflux. There were no bladder mucosal lesions identified. Prostate with moderate lateral lobe enlargement. All fragments appeared to be removed. Cystoscope was removed after emptying the bladder. The patient was taken to the PACU in stable condition. There were no complications. EBL minimal.     ____________________________ Ronda Fairly. Bernardo Heater, MD scs:ce D: 12/17/2013 13:39:59 ET T: 12/17/2013 14:01:42 ET JOB#: [id number]  cc: Collin Reaper C. Bernardo Heater, MD, <Dictator> Abbie Sons MD ELECTRONICALLY SIGNED 12/18/2013 8:42

## 2015-01-06 NOTE — Consult Note (Signed)
PATIENT NAME:  Collin [name], Collin [name] MR#:  [id number] DATE OF BIRTH:  [birth year]  DATE OF CONSULTATION:  08/14/2014  CONSULTING PHYSICIAN:  Arther Dames, MD  REFERRING PHYSICIAN: Max Sane, MD.   REASON FOR CONSULTATION: Dysphagia.   HISTORY OF PRESENT ILLNESS: Collin [name] is a 74 year old male with a past medical history notable for CKD stage 5, anemia of chronic disease and hypertension, who presented to the hospital for evaluation of elevated potassium and was actually initiated on dialysis for end-stage renal disease. In the process of being worked up, it came to light that Collin [name] was having significant dysphagia. He has also had a significant amount of weight loss, approximately 50 pounds over the past 6 months. Collin [name] reports that he does have trouble swallowing, which has been progressive. He seems to be still be able to tolerate solids and liquids, but does at times get things stuck and does have to regurgitate. He has never had an upper endoscopy or barium swallow prior to this that he is aware of. He does not have any family history of esophageal cancer. He is a former smoker, although he only quit about this a few months ago.   PAST MEDICAL HISTORY: 1. 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  2. UTI.  3. Hypertension.  4. Anemia of chronic disease.   HOME MEDICATIONS: Aspirin, atenolol, budesonide, Nexium, Nitrostat, omega fish oil, oxycodone 5 mg every 6 hours, ProAir inhalation and Valium.   ALLERGIES: NKDA.   SOCIAL HISTORY: Former smoker, just recently quit.   FAMILY HISTORY: No family history of any GI malignancy including esophageal cancer.   REVIEW OF SYSTEMS: CONSTITUTIONAL: A 10 system review was conducted. It is negative except as stated in the HPI.   PHYSICAL EXAMINATION:  VITAL SIGNS: Currently his temperature is 97.8, pulse is 68, respirations are 18, blood pressure is 142/87, pulse oximetry is 98% on room air.   GENERAL: Alert and oriented times 4.   Chronically ill-appearing  male. No acute distress. Appears stated age.  HEENT: Normocephalic/atraumatic. Extraocular movements are intact. Anicteric.  NECK: Soft, supple. JVP appears normal. No adenopathy.  CHEST: Clear to auscultation. No wheeze or crackle. Respirations unlabored.  HEART: Regular. No murmur, rub, or gallop.  Normal S1 and S2.  ABDOMEN: Soft, nontender, nondistended.  Normal active bowel sounds in all four quadrants.  No organomegaly. No masses  EXTREMITIES: No swelling, well perfused.  SKIN: No rash or lesion. Skin color, texture, turgor normal.  NEUROLOGICAL: Grossly intact.  PSYCHIATRIC: Normal tone and affect.  MUSCULOSKELETAL: No joint swelling or erythema.   LABORATORY DATA: Currently his sodium 136, potassium 4.5, BUN is 130, creatinine 8.29. His platelets are 190,000. He did have a barium swallow today which does show a mild to moderate mass effect in the distal thoracic esophagus, seems related to extrinsic compression due to cardiomegaly.   ASSESSMENT: Progressive dysphagia, weight loss: This combination is certainly concerning for esophageal cancer; however, he does appear to have possible extrinsic compression from cardiomegaly on his barium swallow and that also may well be the cause for his dysphagia.   RECOMMENDATIONS: 1. We will plan to perform an upper endoscopy to rule out mass lesion on Monday. He should be kept n.p.o. after midnight on Sunday night.  2. Start a proton pump inhibitor 40 mg twice a day.  3. Mechanical soft diet.  4. Will continue to follow.   Thank you for this consult.    ____________________________ Arther Dames, MD mr:TT D: 08/14/2014 17:47:42  ET T: 08/14/2014 21:19:19 ET JOB#: [id number]  cc: Arther Dames, MD, <Dictator> Mellody Life MD ELECTRONICALLY SIGNED 09/14/2014 14:17

## 2015-01-07 DIAGNOSIS — J90 Pleural effusion, not elsewhere classified: Secondary | ICD-10-CM | POA: Diagnosis not present

## 2015-01-07 DIAGNOSIS — F17200 Nicotine dependence, unspecified, uncomplicated: Secondary | ICD-10-CM | POA: Insufficient documentation

## 2015-01-10 NOTE — H&P (Signed)
PATIENT NAME:  Collin [name], Collin [name] MR#:  [id number] DATE OF BIRTH:  Aug 06, 1941  DATE OF ADMISSION:  12/23/2014  PRIMARY CARE PHYSICIAN:  [name], M.D.   CHIEF COMPLAINT: Shortness of breath and wheezing.   HISTORY OF PRESENT ILLNESS: The patient is a 74 year old Caucasian male sent over to the hospital as a direct admission from his primary care physician Dr. Raylene Miyamoto office. The patient was having shortness of breath and cough for more than 1 week and was seen by ED physician last Friday, diagnosed with pneumonia, and he was discharged home with p.o. levofloxacin. The patient took 3 doses of levofloxacin with no significant improvement. The patient went to see his primary care physician Dr. Netty Starring today for the 1st time and at that time he was still wheezing and short of breath.  Chest x-ray performed on April 8 in the ED has revealed a large right-sided effusion and right lower lobe opacification which is representing a combination of pleural effusion and airspace disease. Previous chest at Dr. Raylene Miyamoto office still showing pneumonia and large right-sided pleural effusion. The patient was diffusely wheezing regarding which the patient was sent over to the hospital as a direct admission. During my examination, while resting and speaking patient was short of breath. He was having a hard time to complete sentences. Denies any chest pain, but complaining of lower extremity swelling. The patient admits that he continues to smoke and last cigarette was yesterday night. He also is reporting that his wife is a nonsmoker and he is a past as well as active smoker. Denies any chest pain, but complaining of cough with whitish phlegm. Denies any blood in his phlegm.  Denies any chest pain. No fever. No sick contacts.  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   PAST SURGICAL HISTORY: Permanent dialysis catheter placement.  ALLERGIES: No known drug allergies.   PSYCHOSOCIAL HISTORY: Lives at home with wife, still continues to smoke admitting 2 to 3 cigarettes per day.  He denies alcohol or illicit drug usage.   FAMILY HISTORY: Hypertension runs in his family.   REVIEW OF SYSTEMS:  CONSTITUTIONAL: Complaining of fatigue, weakness, and tiredness.  Concerned about swelling in his bilateral lower extremities and abdominal swelling. HEENT:  No blurry vision, no double vision, redness.  He denies any tinnitus, ear pain, hearing loss.  RESPIRATORY: Complaining of cough with whitish phlegm, wheezing. Denies any hemoptysis. Positive shortness of breath.  CARDIOVASCULAR: No chest pain, palpitations.  Complaining of lower extremity edema which is pitting.  GASTROINTESTINAL: Denies any nausea, vomiting, no diarrhea, constipation.  Reporting increased abdominal girth.  GENITOURINARY: No dysuria, hematuria.  ENDOCRINE:  Denies polyuria, nocturia, thyroid problems.  HEMATOLOGIC: No anemia, but complaining of easy bruising, no bleeding.  SKIN: With multiple bruises on his upper extremities. No rash, no lesion.  MUSCULOSKELETAL: Has chronic low back pain, denies any arthritis.  NEUROLOGIC: Denies any vertigo, ataxia. No history of CVA, TIA, dysarthria. PSYCHIATRIC:  No anxiety or depression.   HOME MEDICATIONS: Aspirin 81 mg p.o. once daily, Nexium 40 mg p.o. once daily, Artificial Tears as needed basis, atenolol 20 mg 1 tablet p.o. once a day, budesonide/formoterol 80 mcg/4.5 inhalations 2 puffs 2 times a day, Levaquin 750 mg 1 tablet p.o. once daily.  He took 3 doses so far.  Oxycodone  5 mg 1 tablet p.o. every 6 hours as needed for moderate pain, prednisone 20 mg 2 tablets p.o. once a day, ProAir 2 puff inhalations every 4 hours as needed for shortness of breath, Valium 10 mg  1 capsule p.o. 2 times a day as needed for anxiety.   PHYSICAL EXAMINATION:  VITAL SIGNS: Temperature 97.6, pulse 78, respirations 24, blood pressure 137/81, pulse oximetry 96% at rest on room air.  GENERAL APPEARANCE: Not in any acute distress. Moderately-built and nourished.  HEENT: Normocephalic, atraumatic. Pupils are equally reacting to light and accommodation. No scleral icterus. No conjunctival injection. No sinus tenderness. No postnasal drip. Moist mucous membranes.  NECK: Supple. No JVD, no thyromegaly, Range of motion is intact. LUNGS: Moderate air entry.  Significantly decreased breath sounds on the right side, left side rhonchi and crackles are present. Diffuse wheezing is present. No accessory muscle use and no anterior chest wall tenderness on palpation.  CARDIAC: S1, S2 normal. Regular rate and rhythm.  GASTROINTESTINAL: Soft, obese. Bowel sounds are positive in all 4 quadrants. Nontender, distended, no rebound tenderness.  NEUROLOGIC: Awake, alert, and oriented x 3. Cranial nerves II through XII are grossly intact. Motor and sensory are intact. Reflexes are 2+.  EXTREMITIES: 2+ pitting edema, no cyanosis, no clubbing. No calf tenderness.  SKIN: Warm to touch. Multiple bruising is present on the bilateral upper and lower extremities. No rashes.  PSYCHIATRIC: Normal mood and affect.   LABORATORY AND ADMITTING STUDIES: Chest x-ray portable from April 8 has revealed a right lower lobe opacification likely representing a combination of pleural effusion and airspace disease. This may represent pneumonia, underlying neoplasm not excluded, no edema to suggest heart failure, large right-sided pleural effusion is present.  Small left-sided pleural effusion is suggested.  Patient had repeat chest x-ray and lumbar spine x-ray performed at his primary care physician Dr. Raylene Miyamoto office.  We are trying to get the results from Dr. Raylene Miyamoto office. The patient also has had blood work, CBC and  CMP at Dr. Raylene Miyamoto office.  Trying to obtain the reports from his office. PT/INR is ordered which is pending. Troponins x 2 and CK-MB x 2 are ordered which are pending. The patient had a recent echocardiogram in December 2015 which had revealed left ventricular ejection fraction 40 to 45%. 7  ASSESSMENT AND PLAN:  A 74 year old Caucasian male who was recently seen by the ED physician on April 8 for shortness of breath and wheezing.  Was diagnosed with a right lower lobe pneumonia and large pleural effusion.  Was discharged home with p.o. levofloxacin and p.o. steroids and inhalers.  The patient has used 3 day of Levaquin and steroids with no significant improvement. Seen by Dr. Netty Starring, new primary care primary care physician, for the first time today. Had a chest x-ray, lumbar spine x-ray, CBC, CMP, and was sent over to the hospital as a direct admission for diffuse shortness of breath and wheezing. Currently we are waiting for the laboratory and x-ray results from Dr. Raylene Miyamoto office.  1.  Acute respiratory distress, multifactorial, from worsening of pneumonia which is healthcare-associated as the patient failed outpatient antibiotics, large right-sided pleural effusion with no improvement, acute exacerbation of chronic obstructive pulmonary disease, smoking, and probably underlying congestive heart failure. Plan: Will admit him to telemetry as a direct admit.  Obtain laboratory results, CBC. CMP, and x-ray results from Dr. Raylene Miyamoto office.  Worsening of pneumonia which is healthcare-associated as patient failed op antibiotics.  Will provide IV Zosyn and levofloxacin.  If there is no significant improvement, will consider up upgrading antibiotics to broad-spectrum.  A.  The patient will be provided with steroids and will go ahead and give IV Solu-Medrol at 125 mg IV and the patient will be continued on 60 mg of IV Solu-Medrol every 6 hours.  B.  Will provide DuoNeb nebulizer treatments and  albuterol treatments as needed basis.  2.  Acute exacerbation of chronic obstructive pulmonary disease, currently diffusely wheezing, but not using any accessory muscles. Will provide IV Solu-Medrol and DuoNeb nebulizer treatments and once his clinical situation is better will encourage him to use incentive spirometry.  3.  Large right-sided pleural effusion, probably parapneumonic and worsened by congestive heart failure.  According to the old x-ray, cannot rule out underlying malignancy.  First will provide him IV antibiotics, ultrasound-guided right-sided thoracentesis is ordered, will send fluid for analysis. If necessary, will consider a CAT scan of the chest.  4.  Probably underlying congestive heart failure.  According to the recent echocardiogram performed in December 2015, his ejection fraction is 40 to 45% which is systolic congestive heart failure.  Will provide him Lasix 40 mg IV.  Regarding diffuse pedal edema and worsening of shortness of breath, the patient will be also given enteric coated baby aspirin and statin. Will continue his home medication atenolol, monitor daily weights, restrict fluid intake, monitor intake and output. Cardiology consult is placed to [facility], Dr. Saralyn Pilar. Check a.m. laboratories.  Will consider adding ACE inhibitor to his regimen once we get the basic laboratories. 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  5.  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  CBC and BMP were obtained at Dr. Raylene Miyamoto office which are pending at this time. Will monitor his renal function closely and if necessary will put a consult to nephrology as the patient is going to be on Lasix.  6.  Hypertension. Continue his home medication atenolol and will consider adding ACE inhibitor if his renal function is stable.  7.  Anemia of chronic kidney disease.  Currently his laboratory work is pending. If necessary, will  provide iron supplements.  8.  Tobacco abuse. Counseled the patient to quit smoking for 3 to 5 minutes. Will provide nicotine patch.  9.  Will provide deep vein thrombosis prophylaxis with heparin subcutaneous. Gastrointestinal prophylaxis with Protonix.  10.  He is a full code. Wife is the medical power of attorney.  Plan of care discussed in detail with the patient. He verbalized understanding of the plan.   Total time spent on admission is 50 minutes.    ____________________________ Nicholes Mango, MD ag:sp D: 12/23/2014 11:51:58 ET T: 12/23/2014 12:52:58 ET JOB#: [id number]  cc: Nicholes Mango, MD, <Dictator> [name], MD  Nicholes Mango MD ELECTRONICALLY SIGNED 12/25/2014 12:56

## 2015-01-10 NOTE — Discharge Summary (Signed)
PATIENT NAME:  Collin, [name] MR#:  [id number] DATE OF BIRTH:  10/02/1940  DATE OF ADMISSION:  12/23/2014 DATE OF DISCHARGE:  12/25/2014  PRESENTING COMPLAINT: Shortness of breath.   DISCHARGE DIAGNOSES: 1. Right-sided pneumonia.  2. Large pleural effusion, transudate.  3. Chronic leg edema.  4. Chronic systolic congestive heart failure.  5. Chronic obstructive pulmonary disease flare.  6. Ongoing tobacco abuse.   CONDITION ON DISCHARGE: Fair.   PROCEDURE: Right-sided thoracentesis with removal of 2 liters of fluid.   SATURATIONS: 93% on room air.   CODE STATUS: Full code.   MEDICATIONS:  1. Acetaminophen 500 mg 2 tablets 3 times a day.  2. Prednisone taper.  3. Oxycodone 5 mg 1 tablet b.i.d. as needed.  4. Budesonide/formoterol 80/4.5 b.i.d.  5. Lasix 20 mg b.i.d.  6. Levaquin 750 mg daily.  7. Guaifenesin 10 mL every 6 hours.  8. Ocular lubricant 2 drops to affected eye.  9. Valium 2 mg 3 times a day as needed.  10. ProAir HFA 2 puffs every 4 hours as needed.    FOLLOW UP: With PCP at [facility], which Dr. Netty Starring.   CONSULTATIONS: Pulmonary consultation with Dr. Mortimer Fries.   LABORATORY DATA: Pleural fluid culture negative. Pleural fluid appears transudative. Sputum with moderate growth of yeast. White count is 9.3. Creatinine is 1.48 and BUN is 48.   PHYSICAL EXAMINATION:  VITAL SIGNS: At discharge, temperature was 97.6, pulse was 97, blood pressure is 130/83. Saturations are 90% to 92% on room air with exertion.  HEENT: Atraumatic, normocephalic. PERRLA. EOM intact. Oral mucosa is moist.  NECK: Supple. No JVD. No carotid bruit.  RESPIRATORY: Decreased breath sounds at the bases; more on the right than left. No rales, rhonchi or wheezing.  HEART: Both the heart sounds are normal. Rate and rhythm regular. PMI not lateralized.  ABDOMEN: Soft, benign, nontender. No organomegaly. Positive bowel sounds. EXTREMITIES: Lower extremity bilateral pitting edema to the knee  joint.   BRIEF SUMMARY OF HOSPITAL COURSE: Collin [name] is a 74 year old, Caucasian gentleman with history of systolic congestive heart failure, hypertension, ongoing tobacco abuse, comes in with increasing shortness of breath. He was admitted with:  1. Acute respiratory distress from worsening pneumonia on the right side with large pleural effusion and with no improvement as an outpatient. He was continued on IV broad-spectrum antibiotics, changed to p.o. Levaquin. Received IV Solu-Medrol, changed to p.o. taper, along with oral nebulizers and DuoNebs.  2. Large pleural effusion, right-sided. The patient underwent thoracentesis with removal of 2 liters of fluid. Fluid appears transudative.  3. CKD stage 3-4. The patient was continued on IV Lasix and changed to p.o. Lasix at discharge.  4. Chronic congestive heart failure with mild exacerbation. The patient received IV Lasix, diuresed well and changed back to p.o. Lasix at discharge.  5. Hypertension. Continued home medications.  6. Anemia of chronic disease, stable.  7. Tobacco abuse. Counseled the patient on cessation and to quit smoking. The patient agreeable. He will follow up with Dr. Netty Starring at his scheduled appointment.  8. The patient remained a full code.  TIME SPENT: 40 minutes.     ____________________________ Hart Rochester Posey Pronto, MD sap:TT D: 12/26/2014 07:00:42 ET T: 12/26/2014 11:50:41 ET JOB#: [id number]  cc: Emonii Wienke A. Posey Pronto, MD, <Dictator> [name], MD Ilda Basset MD ELECTRONICALLY SIGNED 12/30/2014 10:34

## 2015-01-12 ENCOUNTER — Ambulatory Visit: Payer: Self-pay | Admitting: Family

## 2015-03-04 ENCOUNTER — Inpatient Hospital Stay: Payer: Medicare Other

## 2015-03-04 ENCOUNTER — Encounter: Payer: Self-pay | Admitting: Urgent Care

## 2015-03-04 ENCOUNTER — Emergency Department: Payer: Medicare Other

## 2015-03-04 ENCOUNTER — Inpatient Hospital Stay
Admission: EM | Admit: 2015-03-04 | Discharge: 2015-03-06 | DRG: 291 | Disposition: A | Discharge status: Home / Self Care | Payer: Medicare Other | Attending: Internal Medicine | Admitting: Internal Medicine

## 2015-03-04 DIAGNOSIS — I700 Atherosclerosis of aorta: Secondary | ICD-10-CM | POA: Diagnosis not present

## 2015-03-04 DIAGNOSIS — I129 Hypertensive chronic kidney disease with stage 1 through stage 4 chronic kidney disease, or unspecified chronic kidney disease: Secondary | ICD-10-CM | POA: Diagnosis present

## 2015-03-04 DIAGNOSIS — H3530 Unspecified macular degeneration: Secondary | ICD-10-CM | POA: Diagnosis present

## 2015-03-04 DIAGNOSIS — F17210 Nicotine dependence, cigarettes, uncomplicated: Secondary | ICD-10-CM | POA: Diagnosis present

## 2015-03-04 DIAGNOSIS — J449 Chronic obstructive pulmonary disease, unspecified: Secondary | ICD-10-CM | POA: Diagnosis present

## 2015-03-04 DIAGNOSIS — N183 Chronic kidney disease, stage 3 (moderate): Secondary | ICD-10-CM | POA: Diagnosis present

## 2015-03-04 DIAGNOSIS — I517 Cardiomegaly: Secondary | ICD-10-CM | POA: Diagnosis not present

## 2015-03-04 DIAGNOSIS — R05 Cough: Secondary | ICD-10-CM | POA: Diagnosis not present

## 2015-03-04 DIAGNOSIS — R188 Other ascites: Secondary | ICD-10-CM | POA: Diagnosis not present

## 2015-03-04 DIAGNOSIS — J9601 Acute respiratory failure with hypoxia: Secondary | ICD-10-CM | POA: Diagnosis present

## 2015-03-04 DIAGNOSIS — Z79899 Other long term (current) drug therapy: Secondary | ICD-10-CM

## 2015-03-04 DIAGNOSIS — H35039 Hypertensive retinopathy, unspecified eye: Secondary | ICD-10-CM | POA: Diagnosis present

## 2015-03-04 DIAGNOSIS — E785 Hyperlipidemia, unspecified: Secondary | ICD-10-CM | POA: Diagnosis present

## 2015-03-04 DIAGNOSIS — I5022 Chronic systolic (congestive) heart failure: Secondary | ICD-10-CM | POA: Diagnosis not present

## 2015-03-04 DIAGNOSIS — J90 Pleural effusion, not elsewhere classified: Secondary | ICD-10-CM | POA: Diagnosis present

## 2015-03-04 DIAGNOSIS — J410 Simple chronic bronchitis: Secondary | ICD-10-CM | POA: Diagnosis not present

## 2015-03-04 DIAGNOSIS — I509 Heart failure, unspecified: Secondary | ICD-10-CM | POA: Diagnosis not present

## 2015-03-04 DIAGNOSIS — Z825 Family history of asthma and other chronic lower respiratory diseases: Secondary | ICD-10-CM

## 2015-03-04 DIAGNOSIS — Z7982 Long term (current) use of aspirin: Secondary | ICD-10-CM | POA: Diagnosis not present

## 2015-03-04 DIAGNOSIS — K219 Gastro-esophageal reflux disease without esophagitis: Secondary | ICD-10-CM | POA: Diagnosis present

## 2015-03-04 DIAGNOSIS — J948 Other specified pleural conditions: Secondary | ICD-10-CM | POA: Diagnosis not present

## 2015-03-04 DIAGNOSIS — Z8249 Family history of ischemic heart disease and other diseases of the circulatory system: Secondary | ICD-10-CM | POA: Diagnosis not present

## 2015-03-04 DIAGNOSIS — I10 Essential (primary) hypertension: Secondary | ICD-10-CM | POA: Diagnosis present

## 2015-03-04 DIAGNOSIS — R0602 Shortness of breath: Secondary | ICD-10-CM | POA: Diagnosis not present

## 2015-03-04 DIAGNOSIS — I5023 Acute on chronic systolic (congestive) heart failure: Principal | ICD-10-CM | POA: Diagnosis present

## 2015-03-04 DIAGNOSIS — Z8679 Personal history of other diseases of the circulatory system: Secondary | ICD-10-CM | POA: Diagnosis not present

## 2015-03-04 DIAGNOSIS — Z9889 Other specified postprocedural states: Secondary | ICD-10-CM

## 2015-03-04 HISTORY — DX: Unspecified macular degeneration: H35.30

## 2015-03-04 HISTORY — DX: Essential (primary) hypertension: I10

## 2015-03-04 HISTORY — DX: Chronic systolic (congestive) heart failure: I50.22

## 2015-03-04 HISTORY — DX: Calculus of kidney: N20.0

## 2015-03-04 HISTORY — DX: Gastro-esophageal reflux disease without esophagitis: K21.9

## 2015-03-04 HISTORY — DX: Chronic obstructive pulmonary disease, unspecified: J44.9

## 2015-03-04 HISTORY — DX: Hypertensive retinopathy, unspecified eye: H35.039

## 2015-03-04 HISTORY — DX: Hyperlipidemia, unspecified: E78.5

## 2015-03-04 LAB — BODY FLUID CELL COUNT WITH DIFFERENTIAL
EOS FL: 2 %
Lymphs, Fluid: 71 %
MONOCYTE-MACROPHAGE-SEROUS FLUID: 21 % — AB (ref 50–90)
NEUTROPHIL FLUID: 6 % (ref 0–25)
Other Cells, Fluid: 0 %
WBC FLUID: 58 uL

## 2015-03-04 LAB — BASIC METABOLIC PANEL
Anion gap: 10 (ref 5–15)
BUN: 37 mg/dL — ABNORMAL HIGH (ref 6–20)
CO2: 21 mmol/L — ABNORMAL LOW (ref 22–32)
Calcium: 9.2 mg/dL (ref 8.9–10.3)
Chloride: 109 mmol/L (ref 101–111)
Creatinine, Ser: 1.73 mg/dL — ABNORMAL HIGH (ref 0.61–1.24)
GFR calc Af Amer: 44 mL/min — ABNORMAL LOW (ref >60)
GFR calc non Af Amer: 38 mL/min — ABNORMAL LOW (ref >60)
Glucose, Bld: 111 mg/dL — ABNORMAL HIGH (ref 65–99)
Potassium: 4.5 mmol/L (ref 3.5–5.1)
Sodium: 140 mmol/L (ref 135–145)

## 2015-03-04 LAB — LACTATE DEHYDROGENASE, PLEURAL OR PERITONEAL FLUID: LD, Fluid: 39 U/L — ABNORMAL HIGH (ref 3–23)

## 2015-03-04 LAB — PROTEIN, BODY FLUID: Total protein, fluid: <3 g/dL

## 2015-03-04 LAB — CBC
HEMATOCRIT: 42.6 % (ref 40.0–52.0)
HEMOGLOBIN: 13.3 g/dL (ref 13.0–18.0)
MCH: 25 pg — ABNORMAL LOW (ref 26.0–34.0)
MCHC: 31.3 g/dL — AB (ref 32.0–36.0)
MCV: 79.9 fL — ABNORMAL LOW (ref 80.0–100.0)
Platelets: 209 10*3/uL (ref 150–440)
RBC: 5.33 MIL/uL (ref 4.40–5.90)
RDW: 20.4 % — ABNORMAL HIGH (ref 11.5–14.5)
WBC: 10.9 10*3/uL — AB (ref 3.8–10.6)

## 2015-03-04 LAB — GLUCOSE, SEROUS FLUID: Glucose, Fluid: 128 mg/dL

## 2015-03-04 LAB — TROPONIN I: Troponin I: <0.03 ng/mL (ref <0.031)

## 2015-03-04 MED ORDER — ALPRAZOLAM 0.5 MG PO TABS
ORAL_TABLET | ORAL | Status: AC
  Administered 2015-03-04: 0.25 mg via ORAL
  Filled 2015-03-04: qty 1

## 2015-03-04 MED ORDER — FUROSEMIDE 20 MG PO TABS
40.0000 mg | ORAL_TABLET | Freq: Two times a day (BID) | ORAL | Status: DC
  Administered 2015-03-04 – 2015-03-05 (×3): 40 mg via ORAL
  Filled 2015-03-04 (×3): qty 2

## 2015-03-04 MED ORDER — ALPRAZOLAM 0.5 MG PO TABS
0.2500 mg | ORAL_TABLET | Freq: Once | ORAL | Status: AC
  Administered 2015-03-04: 0.25 mg via ORAL

## 2015-03-04 MED ORDER — HYDROCODONE-ACETAMINOPHEN 5-325 MG PO TABS
ORAL_TABLET | ORAL | Status: AC
  Administered 2015-03-04: 1 via ORAL
  Filled 2015-03-04: qty 1

## 2015-03-04 MED ORDER — BACITRACIN 500 UNIT/GM EX OINT
1.0000 "application " | TOPICAL_OINTMENT | Freq: Once | CUTANEOUS | Status: AC
  Administered 2015-03-04: 1 via TOPICAL

## 2015-03-04 MED ORDER — SENNA 8.6 MG PO TABS
1.0000 | ORAL_TABLET | Freq: Two times a day (BID) | ORAL | Status: DC
  Administered 2015-03-04 – 2015-03-06 (×5): 8.6 mg via ORAL
  Filled 2015-03-04 (×5): qty 1

## 2015-03-04 MED ORDER — OXYCODONE HCL 5 MG PO TABS
5.0000 mg | ORAL_TABLET | Freq: Two times a day (BID) | ORAL | Status: DC | PRN
  Administered 2015-03-04 – 2015-03-06 (×4): 5 mg via ORAL
  Filled 2015-03-04 (×4): qty 1

## 2015-03-04 MED ORDER — ASPIRIN EC 81 MG PO TBEC
81.0000 mg | DELAYED_RELEASE_TABLET | Freq: Every day | ORAL | Status: DC
  Administered 2015-03-04 – 2015-03-06 (×3): 81 mg via ORAL
  Filled 2015-03-04 (×3): qty 1

## 2015-03-04 MED ORDER — BACITRACIN ZINC 500 UNIT/GM EX OINT
TOPICAL_OINTMENT | CUTANEOUS | Status: AC
  Administered 2015-03-04: 1 via TOPICAL
  Filled 2015-03-04: qty 0.9

## 2015-03-04 MED ORDER — ACETAMINOPHEN 650 MG RE SUPP: 650.0000 mg | Freq: Four times a day (QID) | RECTAL | Status: DC | PRN

## 2015-03-04 MED ORDER — ONDANSETRON HCL 4 MG/2ML IJ SOLN: 4.0000 mg | Freq: Four times a day (QID) | INTRAMUSCULAR | Status: DC | PRN

## 2015-03-04 MED ORDER — METOPROLOL TARTRATE 25 MG PO TABS: 25.0000 mg | ORAL_TABLET | Freq: Two times a day (BID) | ORAL | Status: DC

## 2015-03-04 MED ORDER — DIAZEPAM 5 MG PO TABS
5.0000 mg | ORAL_TABLET | Freq: Two times a day (BID) | ORAL | Status: DC | PRN
  Administered 2015-03-04 – 2015-03-06 (×4): 5 mg via ORAL
  Filled 2015-03-04 (×4): qty 1

## 2015-03-04 MED ORDER — PANTOPRAZOLE SODIUM 40 MG PO TBEC
40.0000 mg | DELAYED_RELEASE_TABLET | Freq: Every day | ORAL | Status: DC
  Administered 2015-03-04 – 2015-03-06 (×3): 40 mg via ORAL
  Filled 2015-03-04 (×3): qty 1

## 2015-03-04 MED ORDER — ONDANSETRON HCL 4 MG PO TABS: 4.0000 mg | ORAL_TABLET | Freq: Four times a day (QID) | ORAL | Status: DC | PRN

## 2015-03-04 MED ORDER — ALBUTEROL SULFATE (2.5 MG/3ML) 0.083% IN NEBU: 2.5000 mg | INHALATION_SOLUTION | Freq: Four times a day (QID) | RESPIRATORY_TRACT | Status: DC | PRN

## 2015-03-04 MED ORDER — LISINOPRIL 5 MG PO TABS: 5.0000 mg | ORAL_TABLET | Freq: Every day | ORAL | Status: DC

## 2015-03-04 MED ORDER — ACETAMINOPHEN 325 MG PO TABS
650.0000 mg | ORAL_TABLET | Freq: Four times a day (QID) | ORAL | Status: DC | PRN
  Filled 2015-03-04: qty 2

## 2015-03-04 MED ORDER — HEPARIN SODIUM (PORCINE) 5000 UNIT/ML IJ SOLN
5000.0000 [IU] | Freq: Three times a day (TID) | INTRAMUSCULAR | Status: DC
  Administered 2015-03-04 – 2015-03-05 (×4): 5000 [IU] via SUBCUTANEOUS
  Filled 2015-03-04 (×4): qty 1

## 2015-03-04 MED ORDER — HYDROCODONE-ACETAMINOPHEN 5-325 MG PO TABS
1.0000 | ORAL_TABLET | Freq: Once | ORAL | Status: AC
  Administered 2015-03-04: 1 via ORAL

## 2015-03-04 MED ORDER — DOCUSATE SODIUM 100 MG PO CAPS
100.0000 mg | ORAL_CAPSULE | Freq: Two times a day (BID) | ORAL | Status: DC
  Administered 2015-03-04 – 2015-03-06 (×5): 100 mg via ORAL
  Filled 2015-03-04 (×5): qty 1

## 2015-03-04 MED ORDER — ALBUTEROL SULFATE HFA 108 (90 BASE) MCG/ACT IN AERS: 1.0000 | INHALATION_SPRAY | Freq: Four times a day (QID) | RESPIRATORY_TRACT | Status: DC | PRN

## 2015-03-04 MED ORDER — ATENOLOL 50 MG PO TABS
25.0000 mg | ORAL_TABLET | Freq: Two times a day (BID) | ORAL | Status: DC
  Administered 2015-03-04 – 2015-03-05 (×3): 25 mg via ORAL
  Filled 2015-03-04 (×3): qty 1

## 2015-03-04 NOTE — Plan of Care (Signed)
Problem: Discharge Progression Outcomes Goal: Discharge plan in place and appropriate Individualization:  Pt prefers to be called Collin [name] who lives at home.  Hx COPD, CHF, HTN, Gerd, Hyperlipidemia, & chronic back pain controlled by home medications.  High fall risk. Bed alarm on, hourly rounding with toileting offered. Pt shows understanding how to use call system. Goal: Other Discharge Outcomes/Goals Pt resting comfortably in bed, no c/o pain at this time. VSS, remains on 2L with stable O2 sats. Plan to have US guided thoracentesis today. +1 assist out of bed due to left heel sore and generalized weakness.

## 2015-03-04 NOTE — Progress Notes (Signed)
Dr Darvin Neighbours made aware that of pts CT result and questioning possible ABT order, per Dr Darvin Neighbours no abts needed, continue with ordered lasix

## 2015-03-04 NOTE — Progress Notes (Signed)
Dr. Marcille Blanco notified that pt states he does not take Lisinopril or Metoprolol just Atenolol but does not know what dose. MD to modify orders.   Collin [name] [employer]

## 2015-03-04 NOTE — Consult Note (Signed)
Pulmonary Critical Care  Initial Consult Note   Collin [name] MWN:[id number] DOB: 1940/12/08 DOA: 03/04/2015  Referring physician: Kandace Parkins, MD PCP: [name], MD   Chief Complaint: Shortness of Breath  HPI: Collin [name] is a 74 y.o. male with history of COPD CHF presents with increased shortness of breath. Patient was admitted with increasing shortness of breath and had some orthopnea. Patient has noted some chest pain on and off. He has a history of pleural effusion on the right side and was drained in the past having removed approximately 4 liters. Patient on this admission has again noted increased fluid on the right side. Patient states that he also has noted some increased abdominal girth. He does not drink and he has been a smoker which is ongoing. Patient also has a history of COPD in the past.   Review of Systems:  Constitutional:  +weight gain, no night sweats, Fevers, chills, fatigue.  HEENT:  No headaches, nasal congestion, post nasal drip,  Cardio-vascular:  +chest pain, no anasarca, dizziness, palpitations  GI:  No heartburn, indigestion, abdominal pain, nausea, vomiting, diarrhea  Resp:  +shortness of breath with exertion. No coughing up of blood.No wheezing Skin:  no rash or lesions.  Musculoskeletal:  No joint pain or swelling.   Remainder ROS performed and is unremarkable other than noted in HPI  Past Medical History  Diagnosis Date  . COPD (chronic obstructive pulmonary disease)   . Chronic systolic CHF (congestive heart failure)   . Kidney stone   . Macular degeneration   . HLD (hyperlipidemia)   . Hypertensive retinopathy   . HTN (hypertension)   . GERD (gastroesophageal reflux disease)    Past Surgical History  Procedure Laterality Date  . Abdominal aortic aneurysm repair    . Hernia repair    . Kidney stone surgery     Social History:  reports that he has been smoking.  He does not have any smokeless tobacco history on file. He  reports that he does not drink alcohol or use illicit drugs.  Allergies  Allergen Reactions  . Lisinopril Swelling    Pt reports throat swelling.    Family History  Problem Relation Age of Onset  . Heart attack Mother   . Cancer Father   . COPD Father     Prior to Admission medications   Medication Sig Start Date End Date Taking? Authorizing Provider  albuterol (PROVENTIL HFA;VENTOLIN HFA) 108 (90 BASE) MCG/ACT inhaler Inhale into the lungs every 6 (six) hours as needed for wheezing or shortness of breath.   Yes Historical Provider, MD  aspirin 81 MG tablet Take 81 mg by mouth daily.   Yes Historical Provider, MD  furosemide (LASIX) 20 MG tablet Take 20 mg by mouth.   Yes Historical Provider, MD  omeprazole (PRILOSEC) 20 MG capsule Take 20 mg by mouth 2 (two) times daily before a meal.   Yes Historical Provider, MD   Physical Exam: Filed Vitals:   03/04/15 1057 03/04/15 1100 03/04/15 1119 03/04/15 1340  BP: 120/84  118/74 129/74  Pulse: 78 84 81 66  Temp:    97.6 F (36.4 C)  TempSrc:    Oral  Resp: 29 34 34 20  Height:      Weight:      SpO2: 97% 96% 97% 97%    Wt Readings from Last 3 Encounters:  03/04/15 79.379 kg (175 lb)    General:  Appears calm and comfortable Eyes: PERRL, normal lids, irises &  conjunctiva ENT: grossly normal hearing, lips & tongue Neck: no LAD, masses or thyromegaly Cardiovascular: RRR, no m/r/g. Respiratory: CTA on left side. Decreased air entry on right chest exam. Normal respiratory effort. Abdomen: soft, distended ?fluid Skin: no rash or induration seen on limited exam Musculoskeletal: grossly normal tone BUE/BLE Psychiatric: grossly normal mood and affect Neurologic: grossly non-focal.          Labs on Admission:  Basic Metabolic Panel:  Recent Labs Lab 03/04/15 0050  NA 140  K 4.5  CL 109  CO2 21*  GLUCOSE 111*  BUN 37*  CREATININE 1.73*  CALCIUM 9.2   Liver Function Tests: No results for input(s): AST, ALT, ALKPHOS,  BILITOT, PROT, ALBUMIN in the last 168 hours. No results for input(s): LIPASE, AMYLASE in the last 168 hours. No results for input(s): AMMONIA in the last 168 hours. CBC:  Recent Labs Lab 03/04/15 0050  WBC 10.9*  HGB 13.3  HCT 42.6  MCV 79.9*  PLT 209   Cardiac Enzymes:  Recent Labs Lab 03/04/15 0050  TROPONINI <0.03    BNP (last 3 results) No results for input(s): BNP in the last 8760 hours.  ProBNP (last 3 results) No results for input(s): PROBNP in the last 8760 hours.  CBG: No results for input(s): GLUCAP in the last 168 hours.  Radiological Exams on Admission: Dg Chest 1 View  03/04/2015   CLINICAL DATA:  Post right-sided thoracentesis  EXAM: CHEST  1 VIEW  COMPARISON:  Earlier same day ; 12/24/2014  FINDINGS: Grossly unchanged enlarged cardiac silhouette and mediastinal contours given persistently reduced lung volumes. Atherosclerotic plaque within the thoracic aorta. Interval reduction in persistent small right-sided effusion post thoracentesis. No pneumothorax. Improved aeration of the right mid and lower lung with persistent ill-defined heterogeneous consolidative opacities. Mild cephalization of flow within the left lung. No new focal airspace opacities. Unchanged bones.  IMPRESSION: 1. Interval reduction and persistent small right-sided effusion post thoracentesis. No pneumothorax. 2. Improved aeration of the right mid and lower lung with residual opacities favored to represent atelectasis. 3. Similar findings of cardiomegaly and mild pulmonary venous congestion.   Electronically Signed   By: Sandi Mariscal M.D.   On: 03/04/2015 11:41   Dg Chest 2 View  03/04/2015   CLINICAL DATA:  Acute onset of worsening shortness of breath. Chest pressure and productive cough. Initial encounter.  EXAM: CHEST  2 VIEW  COMPARISON:  Chest radiograph performed 12/24/2014  FINDINGS: The lungs are well-aerated. A relatively large right-sided pleural effusion is seen, increased in size from  the prior study. Underlying vascular congestion is noted, with increased interstitial markings, concerning for pulmonary edema. No pneumothorax is seen.  The heart is enlarged.  No acute osseous abnormalities are seen.  IMPRESSION: Vascular congestion and cardiomegaly. Relatively large right-sided pleural effusion, increased in size from the prior study. Increased interstitial markings are concerning for pulmonary edema.   Electronically Signed   By: Garald Balding M.D.   On: 03/04/2015 02:24   Ct Chest Wo Contrast  03/04/2015   CLINICAL DATA:  Admitted to hospital for shortness of breath and chest pain  EXAM: CT CHEST WITHOUT CONTRAST  TECHNIQUE: Multidetector CT imaging of the chest was performed following the standard protocol without IV contrast.  COMPARISON:  03/04/2015  FINDINGS: Mediastinum: Moderate cardiac enlargement. Aortic atherosclerosis noted. Calcifications within the RCA, LAD and left circumflex coronary artery noted. Trachea appears patent and is midline. Normal appearance of the esophagus. Numerous prominent mediastinal nodes are identified. Index  right paratracheal lymph node measures 1.8 cm, image 27/series 2. Pre-vascular lymph node is prominent measuring 11 mm, image 25/series 2.  Lungs/Pleura: Small left pleural effusion and moderate right pleural effusion noted. There is associated compressive type atelectasis involving both lower lobes. Moderate changes of centrilobular emphysema. There is a pulmonary nodule within the left upper lobe measuring 7 mm, image 37/series 3.  Upper Abdomen: Low-attenuation structure in the left hepatic lobe measures 6 mm and is too small to characterize. The adrenal glands appear normal. Normal appearance of the spleen. The visualized portions of the pancreas are unremarkable.  Musculoskeletal: There is mild multi level degenerative disc disease identified within the thoracic spine. No aggressive lytic or sclerotic bone lesions identified.  IMPRESSION: 1.  Cardiac enlargement and bilateral pleural effusions consistent with CHF. 2. Nonspecific pneumonitis within the right upper lobe may be infectious or inflammatory in etiology. 3. Left upper lobe pulmonary nodule measures 7 mm. If the patient is at high risk for bronchogenic carcinoma, follow-up chest CT at 3-79months is recommended. If the patient is at low risk for bronchogenic carcinoma, follow-up chest CT at 6-12 months is recommended. This recommendation follows the consensus statement: Guidelines for Management of Small Pulmonary Nodules Detected on CT Scans: A Statement from the [employer] as published in Radiology 2005; 237:395-400. 4. Aortic atherosclerosis.   Electronically Signed   By: Kerby Moors M.D.   On: 03/04/2015 14:25   US Thoracentesis Asp Pleural Space W/img Guide  03/04/2015   INDICATION: Recurrent symptomatic right sided pleural effusion  EXAM: US THORACENTESIS ASP PLEURAL SPACE W/IMG GUIDE  COMPARISON:  Chest radiograph-earlier same day ; 12/24/2014; ultrasound-guided right-sided thoracentesis -12/24/2014  MEDICATIONS: None  COMPLICATIONS: None immediate  TECHNIQUE: Informed written consent was obtained from the patient after a discussion of the risks, benefits and alternatives to treatment. A timeout was performed prior to the initiation of the procedure.  Initial ultrasound scanning demonstrates a large pleural effusion. The lower chest was prepped and draped in the usual sterile fashion. 1% lidocaine was used for local anesthesia. An ultrasound image was saved for documentation purposes. An 8 Fr Safe-T-Centesis catheter was introduced. The thoracentesis was performed. The catheter was removed and a dressing was applied. The patient tolerated the procedure well without immediate post procedural complication. The patient was escorted to have an upright chest radiograph.  FINDINGS: A total of approximately 2.8 liters of serous fluid was removed. At this point, the patient developed  a cough and asked that the thoracentesis be completed. Requested samples were sent to the laboratory.  Postprocedural imaging demonstrates a residual small to moderate size right-sided effusion.  IMPRESSION: Successful ultrasound-guided right sided thoracentesis yielding 2.8 liters of pleural fluid.  PLAN: Patient may return tomorrow for completion thoracentesis as clinically indicated.   Electronically Signed   By: Sandi Mariscal M.D.   On: 03/04/2015 11:43    EKG: Independently reviewed.  Assessment/Plan Principal Problem:   Acute respiratory failure with hypoxemia Active Problems:   Recurrent right pleural effusion   COPD (chronic obstructive pulmonary disease)   HLD (hyperlipidemia)   Chronic systolic CHF (congestive heart failure)   HTN (hypertension)   GERD (gastroesophageal reflux disease)   1. Recurrent Pleural Effusion -patient had thoracentesis done and the fluid appears to be consistent with transudate -suspect this may be related to his CHF but would also consider an abdominal source -he does states that his abdomen is larger of late than normal would suggest a [place] to look for ascites -diurese  as tolerated for CHF  2. COPD -would continue with inhalers -may follow up as outpatient for further workup in office  3. CHF -I did not see a echo on file would suggest echo to assess LV function and also to assess RV pressures  Time spent: 75min    I have personally obtained a history, examined the patient, evaluated laboratory and imaging results, formulated the assessment and plan and placed orders.  The Patient requires high complexity decision making for assessment and support.    Allyne Gee, MD [facility] Pulmonary Critical Care Medicine Sleep Medicine

## 2015-03-04 NOTE — Care Management (Signed)
Admitted to this facility with the diagnosis of acute respiratory failure. Lives with wife, Silva Bandy [phone]). Sees Dr. Netty Starring. Seen about 2 months ago. States he has Medicare and Medicaid. Gets medications at [facility] in [place]. States she has no problems paying for his medications. No home health. No skilled facility. No home oxygen. No Life Alert, Uses a cane for ambulation/balance. Takes care of instrumental and [place] activities of daily living himself, still drives. States first cousin, Wonda Cheng, will transport. Shelbie Ammons RN MSN Care Management [phone]

## 2015-03-04 NOTE — ED Provider Notes (Signed)
[facility] Emergency Department Provider Note  ____________________________________________  Time seen:   I have reviewed the triage vital signs and the nursing notes.   HISTORY  Chief Complaint Shortness of Breath      HPI Collin [name] is a 74 y.o. male presents with progressive dyspnea since April with acute worsening today area of note patient was diagnosed with pneumonia in April stated that they had to remove 2 L of fluid from his right lung. Patient also admits to productive cough at this time and chest pressure that is generalized. Patient denies any fever at home.     Past Medical History  Diagnosis Date  . COPD (chronic obstructive pulmonary disease)   . CHF (congestive heart failure)     There are no active problems to display for this patient.   Past Surgical History  Procedure Laterality Date  . Abdominal aortic aneurysm repair    . Abdominal aortic aneurysm repair      No current outpatient prescriptions on file.  Allergies Review of patient's allergies indicates no known allergies.  Family History  Problem Relation Age of Onset  . Heart attack      Social History History  Substance Use Topics  . Smoking status: Current Every Day Smoker  . Smokeless tobacco: Not on file  . Alcohol Use: No    Review of Systems  Constitutional: Negative for fever. Eyes: Negative for visual changes. ENT: Negative for sore throat. Cardiovascular: Positive for chest pain. Respiratory: Positive for shortness of breath. Gastrointestinal: Negative for abdominal pain, vomiting and diarrhea. Genitourinary: Negative for dysuria. Musculoskeletal: Negative for back pain. Skin: Negative for rash. Neurological: Negative for headaches, focal weakness or numbness.   10-point ROS otherwise negative.  ____________________________________________   PHYSICAL EXAM:  VITAL SIGNS: ED Triage Vitals  Enc Vitals Group     BP 03/04/15 0023  156/96 mmHg     Pulse Rate 03/04/15 0023 103     Resp 03/04/15 0023 22     Temp 03/04/15 0023 98 F (36.7 C)     Temp Source 03/04/15 0023 Oral     SpO2 03/04/15 0023 90 %     Weight 03/04/15 0023 175 lb (79.379 kg)     Height 03/04/15 0023 6\' 2"  (1.88 m)     Head Cir --      Peak Flow --      Pain Score 03/04/15 0024 7     Pain Loc --      Pain Edu? --      Excl. in [place]? --      Constitutional: Alert and oriented. Well appearing and in no distress. Eyes: Conjunctivae are normal. PERRL. Normal extraocular movements. ENT   Head: Normocephalic and atraumatic.   Nose: No congestion/rhinnorhea.   Mouth/Throat: Mucous membranes are moist.   Neck: No stridor. Hematological/Lymphatic/Immunilogical: No cervical lymphadenopathy. Cardiovascular: Normal rate, regular rhythm. Normal and symmetric distal pulses are present in all extremities. No murmurs, rubs, or gallops. Respiratory: Moderate rest or distress with accessory muscle use, tachypnea, diminished lung sounds right middle and lower lobes. Scant wheeze noted in the left lung field. Gastrointestinal: Soft and nontender. No distention. There is no CVA tenderness. Genitourinary: deferred Musculoskeletal: Nontender with normal range of motion in all extremities. No joint effusions.  No lower extremity tenderness nor edema. Neurologic:  Normal speech and language. No gross focal neurologic deficits are appreciated. Speech is normal.  Skin:  Skin is warm, dry and intact. No rash noted. Psychiatric:  Mood and affect are normal. Speech and behavior are normal. Patient exhibits appropriate insight and judgment.  ____________________________________________    LABS (pertinent positives/negatives)  Labs Reviewed  CBC - Abnormal; Notable for the following:    WBC 10.9 (*)    MCV 79.9 (*)    MCH 25.0 (*)    MCHC 31.3 (*)    RDW 20.4 (*)    All other components within normal limits  BASIC METABOLIC PANEL  TROPONIN I      ____________________________________________   EKG interpreted by me Dr. Marjean Donna   Date: 03/04/2015  Rate: 104  Rhythm: Sinus tachycardia  QRS Axis: normal  Intervals: normal  ST/T Wave abnormalities: normal  Conduction Disutrbances: none  Narrative Interpretation: unremarkable      ____________________________________________    RADIOLOGY  Large right pleural effusion noted on chest x-ray viewed by me  ____________________________________________  Critical care time: 30 minutes   INITIAL IMPRESSION / ASSESSMENT AND PLAN / ED COURSE  Pertinent labs & imaging results that were available during my care of the patient were reviewed by me and considered in my medical decision making (see chart for details).  History physical exam chest x-ray consistent with right pleural effusion. Patient received multiple DuoNeb's  ____________________________________________   FINAL CLINICAL IMPRESSION(S) / ED DIAGNOSES  Final diagnoses:  Pleural effusion, right      Gregor Hams, MD 03/04/15 [phone]

## 2015-03-04 NOTE — ED Notes (Signed)
Patient presents with c/o of worsening SOB since April when he had pneumonia. Patient reporting "pressure" in his chest and a productive cough.

## 2015-03-04 NOTE — H&P (Signed)
[facility] NAME: Collin [name]    MR#:  [id number]  DATE OF BIRTH:  06-Oct-1940  DATE OF ADMISSION:  03/04/2015  PRIMARY CARE PHYSICIAN: [name], MD   REQUESTING/REFERRING PHYSICIAN: Owens Shark  CHIEF COMPLAINT:   Chief Complaint  Patient presents with  . Shortness of Breath    HISTORY OF PRESENT ILLNESS:  Collin [name]  is a 74 y.o. male who presents with acute respiratory failure with hypoxemia. Patient was here in the recent past with right-sided pleural effusion, and had 4 L drawn off with improvement in his breathing at that time. However since then, he states he never got back to baseline breathing and came to the ED tonight as his shortness of breath has gotten significantly worse, progressively. Here chest x-ray showed reaccumulation of right-sided pleural effusion. Patient required supplemental oxygen to bring his sats back up to a more normal level, he was satting the high 70s to low 80s when he came to the ED. He denies any recent symptoms of infection, see full review of systems below. Hospitalists were called for evaluation and treatment of his pleural effusion, and his respiratory failure.  PAST MEDICAL HISTORY:   Past Medical History  Diagnosis Date  . COPD (chronic obstructive pulmonary disease)   . Chronic systolic CHF (congestive heart failure)   . Kidney stone   . Macular degeneration   . HLD (hyperlipidemia)   . Hypertensive retinopathy   . HTN (hypertension)   . GERD (gastroesophageal reflux disease)     PAST SURGICAL HISTORY:   Past Surgical History  Procedure Laterality Date  . Abdominal aortic aneurysm repair    . Hernia repair    . Kidney stone surgery      SOCIAL HISTORY:   History  Substance Use Topics  . Smoking status: Current Every Day Smoker  . Smokeless tobacco: Not on file  . Alcohol Use: No    FAMILY HISTORY:   Family History  Problem Relation Age of Onset  . Heart  attack Mother   . Cancer Father   . COPD Father     DRUG ALLERGIES:  No Known Allergies  MEDICATIONS AT HOME:   Prior to Admission medications   Medication Sig Start Date End Date Taking? Authorizing Provider  albuterol (PROVENTIL HFA;VENTOLIN HFA) 108 (90 BASE) MCG/ACT inhaler Inhale into the lungs every 6 (six) hours as needed for wheezing or shortness of breath.   Yes Historical Provider, MD  aspirin 81 MG tablet Take 81 mg by mouth daily.   Yes Historical Provider, MD  furosemide (LASIX) 20 MG tablet Take 20 mg by mouth.   Yes Historical Provider, MD  lisinopril (PRINIVIL,ZESTRIL) 5 MG tablet Take 5 mg by mouth daily.   Yes Historical Provider, MD  metoprolol tartrate (LOPRESSOR) 25 MG tablet Take 25 mg by mouth 2 (two) times daily.   Yes Historical Provider, MD  omeprazole (PRILOSEC) 20 MG capsule Take 20 mg by mouth 2 (two) times daily before a meal.   Yes Historical Provider, MD    REVIEW OF SYSTEMS:  Review of Systems  Constitutional: Negative for fever, chills, weight loss and malaise/fatigue.  HENT: Negative for ear pain, hearing loss and tinnitus.   Eyes: Negative for blurred vision, double vision, pain and redness.  Respiratory: Positive for shortness of breath. Negative for cough and hemoptysis.   Cardiovascular: Negative for chest pain, palpitations, orthopnea and leg swelling.  Gastrointestinal: Positive for constipation. Negative for nausea,  vomiting, abdominal pain and diarrhea.  Genitourinary: Negative for dysuria, frequency and hematuria.  Musculoskeletal: Negative for back pain, joint pain and neck pain.  Skin:       No acne, rash, or lesions  Neurological: Negative for dizziness, tremors, focal weakness and weakness.  Endo/Heme/Allergies: Negative for polydipsia. Does not bruise/bleed easily.  Psychiatric/Behavioral: Negative for depression. The patient is not nervous/anxious and does not have insomnia.      VITAL SIGNS:   Filed Vitals:   03/04/15 0023  03/04/15 0142 03/04/15 0146 03/04/15 0231  BP: 156/96  181/115 155/99  Pulse: 103  58 92  Temp: 98 F (36.7 C)     TempSrc: Oral     Resp: 22  19 16   Height: 6\' 2"  (1.88 m)     Weight: 79.379 kg (175 lb)     SpO2: 90% 94% 94% 95%   Wt Readings from Last 3 Encounters:  03/04/15 79.379 kg (175 lb)    PHYSICAL EXAMINATION:  Physical Exam  Constitutional: He is oriented to person, place, and time. He appears well-developed and well-nourished. No distress.  HENT:  Head: Normocephalic and atraumatic.  Mouth/Throat: Oropharynx is clear and moist.  Eyes: Conjunctivae and EOM are normal. Pupils are equal, round, and reactive to light. No scleral icterus.  Neck: Normal range of motion. Neck supple. No JVD present. No thyromegaly present.  Cardiovascular: Normal rate, regular rhythm and intact distal pulses.  Exam reveals no gallop and no friction rub.   No murmur heard. Respiratory: He is in respiratory distress. He has no wheezes. He has no rales.  Right lower and mid lung fields without breath sounds, dullness to percussion in corresponding fields. Left lung clear.  GI: Soft. Bowel sounds are normal. He exhibits no distension. There is no tenderness.  Musculoskeletal: Normal range of motion. He exhibits no edema.  No arthritis, no gout  Lymphadenopathy:    He has no cervical adenopathy.  Neurological: He is alert and oriented to person, place, and time. No cranial nerve deficit.  No dysarthria, no aphasia  Skin: Skin is warm and dry. No rash noted. No erythema.  Psychiatric: He has a normal mood and affect. His behavior is normal. Judgment and thought content normal.    LABORATORY PANEL:   CBC  Recent Labs Lab 03/04/15 0050  WBC 10.9*  HGB 13.3  HCT 42.6  PLT 209   ------------------------------------------------------------------------------------------------------------------  Chemistries   Recent Labs Lab 03/04/15 0050  NA 140  K 4.5  CL 109  CO2 21*  GLUCOSE  111*  BUN 37*  CREATININE 1.73*  CALCIUM 9.2   ------------------------------------------------------------------------------------------------------------------  Cardiac Enzymes  Recent Labs Lab 03/04/15 0050  TROPONINI <0.03   ------------------------------------------------------------------------------------------------------------------  RADIOLOGY:  Dg Chest 2 View  03/04/2015   CLINICAL DATA:  Acute onset of worsening shortness of breath. Chest pressure and productive cough. Initial encounter.  EXAM: CHEST  2 VIEW  COMPARISON:  Chest radiograph performed 12/24/2014  FINDINGS: The lungs are well-aerated. A relatively large right-sided pleural effusion is seen, increased in size from the prior study. Underlying vascular congestion is noted, with increased interstitial markings, concerning for pulmonary edema. No pneumothorax is seen.  The heart is enlarged.  No acute osseous abnormalities are seen.  IMPRESSION: Vascular congestion and cardiomegaly. Relatively large right-sided pleural effusion, increased in size from the prior study. Increased interstitial markings are concerning for pulmonary edema.   Electronically Signed   By: Garald Balding M.D.   On: 03/04/2015 02:24  EKG:   Orders placed or performed in visit on 08/13/14  . EKG 12-Lead    IMPRESSION AND PLAN:  Principal Problem:   Acute respiratory failure with hypoxemia - due to his baseline COPD status now with superimposed right recurrent pleural effusion. O2 sats are better on supplemental oxygen for now, we can wean this as an underlying causes of his restrictive failure treated. Saturation goal is 80-92%. See treatment of underlying problems below. Active Problems:   Recurrent right pleural effusion - he will need a repeat thoracentesis, order for ultrasound guided thoracentesis with labs to evaluate etiology of this recurrent pleural effusion.   COPD (chronic obstructive pulmonary disease) - denies any symptoms of a  possible exacerbation, continue home inhalers   Chronic systolic CHF (congestive heart failure) - chronic stable, continue home medications   HLD (hyperlipidemia) - continue home medications   HTN (hypertension) - chronic stable, fairly well controlled, continue home medications   GERD (gastroesophageal reflux disease) - continue home dose PPI  All the records are reviewed and case discussed with ED provider. Management plans discussed with the patient and/or family.  DVT PROPHYLAXIS: SubQ heparin  ADMISSION STATUS: Inpatient  CODE STATUS: Full  TOTAL TIME TAKING CARE OF THIS PATIENT: 45 minutes.    Boni Maclellan FIELDING 03/04/2015, 3:02 AM  [facility]  Office  [phone]  CC: Primary care physician; [name], MD

## 2015-03-04 NOTE — Clinical Social Work Note (Signed)
Clinical Social Worker was consulted for medication assistance. RNCM was consulted as well. Per RNCM, pt has Medicare and Medicaid, and pt does not report an issue with paying for medications. CSW is signing off as no further needs identified. Please reconsult if a need arises prior to discharge.   Darden Dates, MSW, LCSW Clinical Social Worker  [phone]

## 2015-03-04 NOTE — Plan of Care (Signed)
Problem: Discharge Progression Outcomes Goal: Other Discharge Outcomes/Goals Outcome: Progressing Plan of care progress: Respiratory insuff: -continuous pulse ox 95% and higher -[place] right thoracentesis today removed 2.5L -dyspnea on exertion, relieved at rest -o2 at 2L [place] continues -SVN treatments continue with improvement in wheezing -complaints of pain, PRN pain meds given with relief

## 2015-03-04 NOTE — Procedures (Signed)
Successful US guided right sided thoracentesis with 2.8L of serous pleural fluid aspirated.  No immediate complications. Patient requested that the thora be stopped at 2.8 L d/t the development of a cough.   Post procedural [place] scanning and CXR demonstrates a residual small to moderate sized effusion -> as such, pt may return tomorrow for completion thora as clinically indicated.

## 2015-03-04 NOTE — Progress Notes (Signed)
   03/04/15 0800  Clinical Encounter Type  Visited With Patient  Visit Type Initial  Referral From Nurse  Consult/Referral To Chaplain  Spiritual Encounters  Spiritual Needs Prayer  Stress Factors  Patient Stress Factors Health changes  Family Stress Factors None identified    Faith Tradition: [employer] Status: alert and oriented Family: none present but he says his family is supportive.  Visit Assessment: Collin [name] wanted to go in prayer with the Chaplain including some of the clinicians for healing and comfort. 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  The chaplains and the pastoral care can be reached 24x7 via pager [phone] and by submitting an online request

## 2015-03-04 NOTE — Progress Notes (Signed)
[facility] [place] NAME: Collin [name]    MR#:  [id number]  DATE OF BIRTH:  17-Jul-1941  SUBJECTIVE:  CHIEF COMPLAINT:   Chief Complaint  Patient presents with  . Shortness of Breath   As of breath same. Bilateral chest pain on coughing. Orthopnea positive. Continues to smoke. REVIEW OF SYSTEMS:    Review of Systems  Constitutional: Positive for chills and malaise/fatigue. Negative for fever.  HENT: Negative for sore throat.   Eyes: Negative for blurred vision, double vision and pain.  Respiratory: Positive for cough, shortness of breath and wheezing. Negative for hemoptysis.   Cardiovascular: Negative for chest pain, palpitations, orthopnea and leg swelling.  Gastrointestinal: Positive for constipation. Negative for heartburn, nausea, vomiting, abdominal pain and diarrhea.  Genitourinary: Negative for dysuria and hematuria.  Musculoskeletal: Positive for back pain. Negative for joint pain.  Skin: Negative for rash.  Neurological: Positive for weakness. Negative for sensory change, speech change, focal weakness and headaches.  Endo/Heme/Allergies: Does not bruise/bleed easily.  Psychiatric/Behavioral: Negative for depression. The patient is not nervous/anxious.       DRUG ALLERGIES:   Allergies  Allergen Reactions  . Lisinopril Swelling    Pt reports throat swelling.    VITALS:  Blood pressure 118/74, pulse 81, temperature 97.4 F (36.3 C), temperature source Oral, resp. rate 34, height 6\' 2"  (1.88 m), weight 79.379 kg (175 lb), SpO2 97 %.  PHYSICAL EXAMINATION:   Physical Exam  GENERAL:  74 y.o.-year-old patient lying in the bed with no acute distress.  EYES: Pupils equal, round, reactive to light and accommodation. No scleral icterus. Extraocular muscles intact.  HEENT: Head atraumatic, normocephalic. Oropharynx and nasopharynx clear.  NECK:  Supple, no jugular venous distention. No thyroid enlargement, no  tenderness.  LUNGS: Increased work of breathing, decreased breath sounds on the right side. CARDIOVASCULAR: S1, S2 normal. No murmurs, rubs, or gallops.  ABDOMEN: Soft, nontender, nondistended. Bowel sounds present. No organomegaly or mass.  EXTREMITIES: No cyanosis, clubbing or edema b/l.    NEUROLOGIC: Cranial nerves II through XII are intact. No focal Motor or sensory deficits b/l.   PSYCHIATRIC: The patient is alert and oriented x 3.  SKIN: No obvious rash, lesion, or ulcer.    LABORATORY PANEL:   CBC  Recent Labs Lab 03/04/15 0050  WBC 10.9*  HGB 13.3  HCT 42.6  PLT 209   ------------------------------------------------------------------------------------------------------------------  Chemistries   Recent Labs Lab 03/04/15 0050  NA 140  K 4.5  CL 109  CO2 21*  GLUCOSE 111*  BUN 37*  CREATININE 1.73*  CALCIUM 9.2   ------------------------------------------------------------------------------------------------------------------  Cardiac Enzymes  Recent Labs Lab 03/04/15 0050  TROPONINI <0.03   ------------------------------------------------------------------------------------------------------------------  RADIOLOGY:  Dg Chest 1 View  03/04/2015   CLINICAL DATA:  Post right-sided thoracentesis  EXAM: CHEST  1 VIEW  COMPARISON:  Earlier same day ; 12/24/2014  FINDINGS: Grossly unchanged enlarged cardiac silhouette and mediastinal contours given persistently reduced lung volumes. Atherosclerotic plaque within the thoracic aorta. Interval reduction in persistent small right-sided effusion post thoracentesis. No pneumothorax. Improved aeration of the right mid and lower lung with persistent ill-defined heterogeneous consolidative opacities. Mild cephalization of flow within the left lung. No new focal airspace opacities. Unchanged bones.  IMPRESSION: 1. Interval reduction and persistent small right-sided effusion post thoracentesis. No pneumothorax. 2. Improved  aeration of the right mid and lower lung with residual opacities favored to represent atelectasis. 3. Similar findings of cardiomegaly and mild pulmonary  venous congestion.   Electronically Signed   By: Sandi Mariscal M.D.   On: 03/04/2015 11:41   Dg Chest 2 View  03/04/2015   CLINICAL DATA:  Acute onset of worsening shortness of breath. Chest pressure and productive cough. Initial encounter.  EXAM: CHEST  2 VIEW  COMPARISON:  Chest radiograph performed 12/24/2014  FINDINGS: The lungs are well-aerated. A relatively large right-sided pleural effusion is seen, increased in size from the prior study. Underlying vascular congestion is noted, with increased interstitial markings, concerning for pulmonary edema. No pneumothorax is seen.  The heart is enlarged.  No acute osseous abnormalities are seen.  IMPRESSION: Vascular congestion and cardiomegaly. Relatively large right-sided pleural effusion, increased in size from the prior study. Increased interstitial markings are concerning for pulmonary edema.   Electronically Signed   By: Garald Balding M.D.   On: 03/04/2015 02:24   US Thoracentesis Asp Pleural Space W/img Guide  03/04/2015   INDICATION: Recurrent symptomatic right sided pleural effusion  EXAM: US THORACENTESIS ASP PLEURAL SPACE W/IMG GUIDE  COMPARISON:  Chest radiograph-earlier same day ; 12/24/2014; ultrasound-guided right-sided thoracentesis -12/24/2014  MEDICATIONS: None  COMPLICATIONS: None immediate  TECHNIQUE: Informed written consent was obtained from the patient after a discussion of the risks, benefits and alternatives to treatment. A timeout was performed prior to the initiation of the procedure.  Initial ultrasound scanning demonstrates a large pleural effusion. The lower chest was prepped and draped in the usual sterile fashion. 1% lidocaine was used for local anesthesia. An ultrasound image was saved for documentation purposes. An 8 Fr Safe-T-Centesis catheter was introduced. The thoracentesis  was performed. The catheter was removed and a dressing was applied. The patient tolerated the procedure well without immediate post procedural complication. The patient was escorted to have an upright chest radiograph.  FINDINGS: A total of approximately 2.8 liters of serous fluid was removed. At this point, the patient developed a cough and asked that the thoracentesis be completed. Requested samples were sent to the laboratory.  Postprocedural imaging demonstrates a residual small to moderate size right-sided effusion.  IMPRESSION: Successful ultrasound-guided right sided thoracentesis yielding 2.8 liters of pleural fluid.  PLAN: Patient may return tomorrow for completion thoracentesis as clinically indicated.   Electronically Signed   By: Sandi Mariscal M.D.   On: 03/04/2015 11:43     ASSESSMENT AND PLAN:   74 year old male patient with tobacco abuse, COPD, CKD stage III, hypertension, chronic systolic congestive heart failure here with SOB  * Recurrent right pleural effusion Patient was here 6 weeks back and had thoracentesis and improved well. Is treated for pneumonia during that admission. Fluid was transudate and cultures were negative. The schedule for thoracentesis later today. Ordered a CT scan of the chest after thoracentesis to rule out malignancy. Long smoking history. Consult pulmonology. Does not have pulmonology following him as outpatient.  * COPD No wheezing. Continue oxygen and home inhalers along with nebulizers as needed.  * CKD stage III. Creatinine 1.73 which is improved from prior levels.  * Chronic systolic congestive heart failure Is likely contributing to his recurrent pleural effusion. No JVD or lower extremity edema found. We will increase Lasix orally from 20 mg once a day to 40 mg 2 times a day.   All the records are reviewed and case discussed with Care Management/Social Workerr. Management plans discussed with the patient, family and they are in  agreement.  CODE STATUS: FULL.  DVT Prophylaxis: SCDs.  TOTAL TIME TAKING CARE OF THIS PATIENT:  35 minutes.   POSSIBLE D/C IN 1-2 DAYS, DEPENDING ON CLINICAL CONDITION.   Hillary Bow R M.D on 03/04/2015 at 12:29 PM  Between 7am to 6pm - Pager - [phone]  After 6pm go to [email or web address] - password [email or web address] [facility]  Office  [phone]  CC: Primary care physician; [name], MD

## 2015-03-05 ENCOUNTER — Inpatient Hospital Stay
Admit: 2015-03-05 | Discharge: 2015-03-05 | Disposition: A | Discharge status: Home / Self Care | Payer: Medicare Other | Attending: Internal Medicine | Admitting: Internal Medicine

## 2015-03-05 ENCOUNTER — Inpatient Hospital Stay: Payer: Medicare Other

## 2015-03-05 LAB — BASIC METABOLIC PANEL
Anion gap: 6 (ref 5–15)
BUN: 44 mg/dL — ABNORMAL HIGH (ref 6–20)
CHLORIDE: 108 mmol/L (ref 101–111)
CO2: 23 mmol/L (ref 22–32)
Calcium: 8.7 mg/dL — ABNORMAL LOW (ref 8.9–10.3)
Creatinine, Ser: 1.81 mg/dL — ABNORMAL HIGH (ref 0.61–1.24)
GFR calc Af Amer: 41 mL/min — ABNORMAL LOW (ref >60)
GFR calc non Af Amer: 36 mL/min — ABNORMAL LOW (ref >60)
GLUCOSE: 105 mg/dL — AB (ref 65–99)
Potassium: 4.9 mmol/L (ref 3.5–5.1)
SODIUM: 137 mmol/L (ref 135–145)

## 2015-03-05 LAB — PATHOLOGIST SMEAR REVIEW

## 2015-03-05 MED ORDER — LOSARTAN POTASSIUM 50 MG PO TABS
25.0000 mg | ORAL_TABLET | Freq: Every day | ORAL | Status: DC
  Administered 2015-03-05 – 2015-03-06 (×2): 25 mg via ORAL
  Filled 2015-03-05 (×2): qty 1

## 2015-03-05 MED ORDER — ATENOLOL 50 MG PO TABS
25.0000 mg | ORAL_TABLET | Freq: Two times a day (BID) | ORAL | Status: DC
  Administered 2015-03-05 – 2015-03-06 (×2): 25 mg via ORAL
  Filled 2015-03-05 (×2): qty 1

## 2015-03-05 MED ORDER — METOPROLOL SUCCINATE ER 25 MG PO TB24
25.0000 mg | ORAL_TABLET | Freq: Every day | ORAL | Status: DC
  Filled 2015-03-05: qty 1

## 2015-03-05 MED ORDER — ENOXAPARIN SODIUM 40 MG/0.4ML ~~LOC~~ SOLN
40.0000 mg | SUBCUTANEOUS | Status: DC
  Administered 2015-03-05: 40 mg via SUBCUTANEOUS
  Filled 2015-03-05: qty 0.4

## 2015-03-05 NOTE — Progress Notes (Signed)
[facility] [place] NAME: Collin [name]    MR#:  [id number]  DATE OF BIRTH:  Jan 02, 1941  SUBJECTIVE:  CHIEF COMPLAINT:   Chief Complaint  Patient presents with  . Shortness of Breath   Bilateral chest pain on coughing. Orthopnea positive. Continues to smoke. SOB better after thoracentesis. REVIEW OF SYSTEMS:    Review of Systems  Constitutional: Positive for chills and malaise/fatigue. Negative for fever.  HENT: Negative for sore throat.   Eyes: Negative for blurred vision, double vision and pain.  Respiratory: Positive for cough, shortness of breath and wheezing. Negative for hemoptysis.   Cardiovascular: Negative for chest pain, palpitations, orthopnea and leg swelling.  Gastrointestinal: Positive for constipation. Negative for heartburn, nausea, vomiting, abdominal pain and diarrhea.  Genitourinary: Negative for dysuria and hematuria.  Musculoskeletal: Positive for back pain. Negative for joint pain.  Skin: Negative for rash.  Neurological: Positive for weakness. Negative for sensory change, speech change, focal weakness and headaches.  Endo/Heme/Allergies: Does not bruise/bleed easily.  Psychiatric/Behavioral: Negative for depression. The patient is not nervous/anxious.       DRUG ALLERGIES:   Allergies  Allergen Reactions  . Lisinopril Swelling    Pt reports throat swelling.    VITALS:  Blood pressure 127/79, pulse 70, temperature 97.5 F (36.4 C), temperature source Oral, resp. rate 19, height 6\' 2"  (1.88 m), weight 83.28 kg (183 lb 9.6 oz), SpO2 96 %.  PHYSICAL EXAMINATION:   Physical Exam  GENERAL:  74 y.o.-year-old patient lying in the bed with no acute distress.  EYES: Pupils equal, round, reactive to light and accommodation. No scleral icterus. Extraocular muscles intact.  HEENT: Head atraumatic, normocephalic. Oropharynx and nasopharynx clear.  NECK:  Supple, no jugular venous distention. No thyroid  enlargement, no tenderness.  LUNGS: crackles bilateral CARDIOVASCULAR: S1, S2 normal. No murmurs, rubs, or gallops.  ABDOMEN: Soft, nontender, nondistended. Bowel sounds present. No organomegaly or mass.  EXTREMITIES: No cyanosis, clubbing or edema b/l.    NEUROLOGIC: Cranial nerves II through XII are intact. No focal Motor or sensory deficits b/l.   PSYCHIATRIC: The patient is alert and oriented x 3.  SKIN: No obvious rash, lesion, or ulcer.    LABORATORY PANEL:   CBC  Recent Labs Lab 03/04/15 0050  WBC 10.9*  HGB 13.3  HCT 42.6  PLT 209   ------------------------------------------------------------------------------------------------------------------  Chemistries   Recent Labs Lab 03/05/15 0618  NA 137  K 4.9  CL 108  CO2 23  GLUCOSE 105*  BUN 44*  CREATININE 1.81*  CALCIUM 8.7*   ------------------------------------------------------------------------------------------------------------------  Cardiac Enzymes  Recent Labs Lab 03/04/15 0050  TROPONINI <0.03   ------------------------------------------------------------------------------------------------------------------  RADIOLOGY:  Dg Chest 1 View  03/04/2015   CLINICAL DATA:  Post right-sided thoracentesis  EXAM: CHEST  1 VIEW  COMPARISON:  Earlier same day ; 12/24/2014  FINDINGS: Grossly unchanged enlarged cardiac silhouette and mediastinal contours given persistently reduced lung volumes. Atherosclerotic plaque within the thoracic aorta. Interval reduction in persistent small right-sided effusion post thoracentesis. No pneumothorax. Improved aeration of the right mid and lower lung with persistent ill-defined heterogeneous consolidative opacities. Mild cephalization of flow within the left lung. No new focal airspace opacities. Unchanged bones.  IMPRESSION: 1. Interval reduction and persistent small right-sided effusion post thoracentesis. No pneumothorax. 2. Improved aeration of the right mid and lower  lung with residual opacities favored to represent atelectasis. 3. Similar findings of cardiomegaly and mild pulmonary venous congestion.   Electronically Signed  By: Sandi Mariscal M.D.   On: 03/04/2015 11:41   Dg Chest 2 View  03/04/2015   CLINICAL DATA:  Acute onset of worsening shortness of breath. Chest pressure and productive cough. Initial encounter.  EXAM: CHEST  2 VIEW  COMPARISON:  Chest radiograph performed 12/24/2014  FINDINGS: The lungs are well-aerated. A relatively large right-sided pleural effusion is seen, increased in size from the prior study. Underlying vascular congestion is noted, with increased interstitial markings, concerning for pulmonary edema. No pneumothorax is seen.  The heart is enlarged.  No acute osseous abnormalities are seen.  IMPRESSION: Vascular congestion and cardiomegaly. Relatively large right-sided pleural effusion, increased in size from the prior study. Increased interstitial markings are concerning for pulmonary edema.   Electronically Signed   By: Garald Balding M.D.   On: 03/04/2015 02:24   Ct Chest Wo Contrast  03/04/2015   CLINICAL DATA:  Admitted to hospital for shortness of breath and chest pain  EXAM: CT CHEST WITHOUT CONTRAST  TECHNIQUE: Multidetector CT imaging of the chest was performed following the standard protocol without IV contrast.  COMPARISON:  03/04/2015  FINDINGS: Mediastinum: Moderate cardiac enlargement. Aortic atherosclerosis noted. Calcifications within the RCA, LAD and left circumflex coronary artery noted. Trachea appears patent and is midline. Normal appearance of the esophagus. Numerous prominent mediastinal nodes are identified. Index right paratracheal lymph node measures 1.8 cm, image 27/series 2. Pre-vascular lymph node is prominent measuring 11 mm, image 25/series 2.  Lungs/Pleura: Small left pleural effusion and moderate right pleural effusion noted. There is associated compressive type atelectasis involving both lower lobes. Moderate  changes of centrilobular emphysema. There is a pulmonary nodule within the left upper lobe measuring 7 mm, image 37/series 3.  Upper Abdomen: Low-attenuation structure in the left hepatic lobe measures 6 mm and is too small to characterize. The adrenal glands appear normal. Normal appearance of the spleen. The visualized portions of the pancreas are unremarkable.  Musculoskeletal: There is mild multi level degenerative disc disease identified within the thoracic spine. No aggressive lytic or sclerotic bone lesions identified.  IMPRESSION: 1. Cardiac enlargement and bilateral pleural effusions consistent with CHF. 2. Nonspecific pneumonitis within the right upper lobe may be infectious or inflammatory in etiology. 3. Left upper lobe pulmonary nodule measures 7 mm. If the patient is at high risk for bronchogenic carcinoma, follow-up chest CT at 3-27months is recommended. If the patient is at low risk for bronchogenic carcinoma, follow-up chest CT at 6-12 months is recommended. This recommendation follows the consensus statement: Guidelines for Management of Small Pulmonary Nodules Detected on CT Scans: A Statement from the [place] as published in Radiology 2005; 237:395-400. 4. Aortic atherosclerosis.   Electronically Signed   By: Kerby Moors M.D.   On: 03/04/2015 14:25   US Abdomen Limited  03/05/2015   CLINICAL DATA:  Ascites.  EXAM: LIMITED ABDOMEN ULTRASOUND FOR ASCITES  TECHNIQUE: Limited ultrasound survey for ascites was performed in all four abdominal quadrants.  COMPARISON:  None.  FINDINGS: Small amount of fluid is noted around the liver. No other fluid is seen in the remaining quadrants of the abdomen.  IMPRESSION: Minimal ascites is noted around the liver.   Electronically Signed   By: Marijo Conception, M.D.   On: 03/05/2015 09:08   US Thoracentesis Asp Pleural Space W/img Guide  03/04/2015   INDICATION: Recurrent symptomatic right sided pleural effusion  EXAM: US THORACENTESIS ASP PLEURAL  SPACE W/IMG GUIDE  COMPARISON:  Chest radiograph-earlier same day ; 12/24/2014;  ultrasound-guided right-sided thoracentesis -12/24/2014  MEDICATIONS: None  COMPLICATIONS: None immediate  TECHNIQUE: Informed written consent was obtained from the patient after a discussion of the risks, benefits and alternatives to treatment. A timeout was performed prior to the initiation of the procedure.  Initial ultrasound scanning demonstrates a large pleural effusion. The lower chest was prepped and draped in the usual sterile fashion. 1% lidocaine was used for local anesthesia. An ultrasound image was saved for documentation purposes. An 8 Fr Safe-T-Centesis catheter was introduced. The thoracentesis was performed. The catheter was removed and a dressing was applied. The patient tolerated the procedure well without immediate post procedural complication. The patient was escorted to have an upright chest radiograph.  FINDINGS: A total of approximately 2.8 liters of serous fluid was removed. At this point, the patient developed a cough and asked that the thoracentesis be completed. Requested samples were sent to the laboratory.  Postprocedural imaging demonstrates a residual small to moderate size right-sided effusion.  IMPRESSION: Successful ultrasound-guided right sided thoracentesis yielding 2.8 liters of pleural fluid.  PLAN: Patient may return tomorrow for completion thoracentesis as clinically indicated.   Electronically Signed   By: Sandi Mariscal M.D.   On: 03/04/2015 11:43     ASSESSMENT AND PLAN:   74 year old male patient with tobacco abuse, COPD, CKD stage III, hypertension, chronic systolic congestive heart failure here with SOB  * Recurrent right pleural effusion Likely from CHF. No PNA. Appreciate pulmonary help. Repeat CXR in AM  * COPD No wheezing. Continue oxygen and home inhalers along with nebulizers as needed.  * CKD stage III. Creatinine stable.  * Acute on Chronic systolic congestive heart  failure Increase lasix to 40 BID. Takes 20mg  bid at home. I/Os. Monitor Cr.  All the records are reviewed and case discussed with Care Management/Social Workerr. Management plans discussed with the patient, family and they are in agreement.  CODE STATUS: FULL.  DVT Prophylaxis: SCDs.  TOTAL TIME TAKING CARE OF THIS PATIENT: 35 minutes.   POSSIBLE D/C IN 1-2 DAYS, DEPENDING ON CLINICAL CONDITION.   Hillary Bow R M.D on 03/05/2015 at 1:25 PM  Between 7am to 6pm - Pager - [phone]  After 6pm go to [email or web address] - password [email or web address] [facility]  Office  [phone]  CC: Primary care physician; [name], MD

## 2015-03-05 NOTE — Progress Notes (Signed)
*  PRELIMINARY RESULTS* Echocardiogram 2D Echocardiogram has been performed.  Collin [name] 03/05/2015, 10:13 AM

## 2015-03-05 NOTE — Plan of Care (Signed)
Problem: Discharge Progression Outcomes Goal: Other Discharge Outcomes/Goals Plan of care progress to goals: Continuous pulse Ox>95%. Wheezing. VSS. Thoracentesis puncture site WDL. Echo and abd [place] pending.

## 2015-03-05 NOTE — Progress Notes (Signed)
Dr Josefa Half made aware that pt states metoprolol makes his ears ring, he has taken med in the past and doesn't want it again, new order to discontinue metoprolol and reorder previous atenolol

## 2015-03-05 NOTE — Consult Note (Signed)
[facility] Cardiology  CARDIOLOGY CONSULT NOTE  Patient ID: Collin [name] MRN: [id number] DOB/AGE: 72-Apr-1942 74 y.o.  Admit date: 03/04/2015 Referring Physician [place] Primary Physician [place] Primary Cardiologist [facility] Reason for Consultation systolic congestive heart failure  HPI: 74 year old gentleman referred for evaluation of systolic congestive heart failure. The patient reports a history of COPD and chronic systolic congestive heart failure. The patient reports he is followed by cardiologist at [facility]. Patient's had a history of a right pleural effusion status post thoracentesis. A repeat percent with shortness of breath, hypoxemia and recurrent right-sided pleural effusion. Admission laboratories were notable for a kidney disease. 2-D echocardiogram was performed 03/05/15 which revealed left ventricular dilatation, and severely reduced left ventricular function with LV ejection fraction of 25%.  Review of systems complete and found to be negative unless listed above     Past Medical History  Diagnosis Date  . COPD (chronic obstructive pulmonary disease)   . Chronic systolic CHF (congestive heart failure)   . Kidney stone   . Macular degeneration   . HLD (hyperlipidemia)   . Hypertensive retinopathy   . HTN (hypertension)   . GERD (gastroesophageal reflux disease)     Past Surgical History  Procedure Laterality Date  . Abdominal aortic aneurysm repair    . Hernia repair    . Kidney stone surgery      Prescriptions prior to admission  Medication Sig Dispense Refill Last Dose  . albuterol (PROVENTIL HFA;VENTOLIN HFA) 108 (90 BASE) MCG/ACT inhaler Inhale into the lungs every 6 (six) hours as needed for wheezing or shortness of breath.     Marland Kitchen aspirin 81 MG tablet Take 81 mg by mouth daily.     . furosemide (LASIX) 20 MG tablet Take 20 mg by mouth.     Marland Kitchen omeprazole (PRILOSEC) 20 MG capsule Take 20 mg by mouth 2 (two) times daily before a meal.      History   Social  History  . Marital Status: Married    Spouse Name: N/A  . Number of Children: N/A  . Years of Education: N/A   Occupational History  . Not on file.   Social History Main Topics  . Smoking status: Current Every Day Smoker  . Smokeless tobacco: Not on file  . Alcohol Use: No  . Drug Use: No  . Sexual Activity: Not on file   Other Topics Concern  . Not on file   Social History Narrative  . No narrative on file    Family History  Problem Relation Age of Onset  . Heart attack Mother   . Cancer Father   . COPD Father       Review of systems complete and found to be negative unless listed above      PHYSICAL EXAM  General: Well developed, well nourished, in no acute distress HEENT:  Normocephalic and atramatic Neck:  No JVD.  Lungs: Decreased breath sounds right base Heart: HRRR . Normal S1 and S2 without gallops or murmurs.  Abdomen: Bowel sounds are positive, abdomen soft and non-tender  Msk:  Back normal, normal gait. Normal strength and tone for age. Extremities: No clubbing, cyanosis or edema.   Neuro: Alert and oriented X 3. Psych:  Good affect, responds appropriately  Labs:   Lab Results  Component Value Date   WBC 10.9* 03/04/2015   HGB 13.3 03/04/2015   HCT 42.6 03/04/2015   MCV 79.9* 03/04/2015   PLT 209 03/04/2015    Recent Labs Lab  03/05/15 0618  NA 137  K 4.9  CL 108  CO2 23  BUN 44*  CREATININE 1.81*  CALCIUM 8.7*  GLUCOSE 105*   Lab Results  Component Value Date   TROPONINI <0.03 03/04/2015   No results found for: CHOL No results found for: HDL No results found for: LDLCALC No results found for: TRIG No results found for: CHOLHDL No results found for: LDLDIRECT    Radiology: Dg Chest 1 View  03/04/2015   CLINICAL DATA:  Post right-sided thoracentesis  EXAM: CHEST  1 VIEW  COMPARISON:  Earlier same day ; 12/24/2014  FINDINGS: Grossly unchanged enlarged cardiac silhouette and mediastinal contours given persistently reduced lung  volumes. Atherosclerotic plaque within the thoracic aorta. Interval reduction in persistent small right-sided effusion post thoracentesis. No pneumothorax. Improved aeration of the right mid and lower lung with persistent ill-defined heterogeneous consolidative opacities. Mild cephalization of flow within the left lung. No new focal airspace opacities. Unchanged bones.  IMPRESSION: 1. Interval reduction and persistent small right-sided effusion post thoracentesis. No pneumothorax. 2. Improved aeration of the right mid and lower lung with residual opacities favored to represent atelectasis. 3. Similar findings of cardiomegaly and mild pulmonary venous congestion.   Electronically Signed   By: Sandi Mariscal M.D.   On: 03/04/2015 11:41   Dg Chest 2 View  03/04/2015   CLINICAL DATA:  Acute onset of worsening shortness of breath. Chest pressure and productive cough. Initial encounter.  EXAM: CHEST  2 VIEW  COMPARISON:  Chest radiograph performed 12/24/2014  FINDINGS: The lungs are well-aerated. A relatively large right-sided pleural effusion is seen, increased in size from the prior study. Underlying vascular congestion is noted, with increased interstitial markings, concerning for pulmonary edema. No pneumothorax is seen.  The heart is enlarged.  No acute osseous abnormalities are seen.  IMPRESSION: Vascular congestion and cardiomegaly. Relatively large right-sided pleural effusion, increased in size from the prior study. Increased interstitial markings are concerning for pulmonary edema.   Electronically Signed   By: Garald Balding M.D.   On: 03/04/2015 02:24   Ct Chest Wo Contrast  03/04/2015   CLINICAL DATA:  Admitted to hospital for shortness of breath and chest pain  EXAM: CT CHEST WITHOUT CONTRAST  TECHNIQUE: Multidetector CT imaging of the chest was performed following the standard protocol without IV contrast.  COMPARISON:  03/04/2015  FINDINGS: Mediastinum: Moderate cardiac enlargement. Aortic  atherosclerosis noted. Calcifications within the RCA, LAD and left circumflex coronary artery noted. Trachea appears patent and is midline. Normal appearance of the esophagus. Numerous prominent mediastinal nodes are identified. Index right paratracheal lymph node measures 1.8 cm, image 27/series 2. Pre-vascular lymph node is prominent measuring 11 mm, image 25/series 2.  Lungs/Pleura: Small left pleural effusion and moderate right pleural effusion noted. There is associated compressive type atelectasis involving both lower lobes. Moderate changes of centrilobular emphysema. There is a pulmonary nodule within the left upper lobe measuring 7 mm, image 37/series 3.  Upper Abdomen: Low-attenuation structure in the left hepatic lobe measures 6 mm and is too small to characterize. The adrenal glands appear normal. Normal appearance of the spleen. The visualized portions of the pancreas are unremarkable.  Musculoskeletal: There is mild multi level degenerative disc disease identified within the thoracic spine. No aggressive lytic or sclerotic bone lesions identified.  IMPRESSION: 1. Cardiac enlargement and bilateral pleural effusions consistent with CHF. 2. Nonspecific pneumonitis within the right upper lobe may be infectious or inflammatory in etiology. 3. Left upper lobe pulmonary  nodule measures 7 mm. If the patient is at high risk for bronchogenic carcinoma, follow-up chest CT at 3-35months is recommended. If the patient is at low risk for bronchogenic carcinoma, follow-up chest CT at 6-12 months is recommended. This recommendation follows the consensus statement: Guidelines for Management of Small Pulmonary Nodules Detected on CT Scans: A Statement from the [place] as published in Radiology 2005; 237:395-400. 4. Aortic atherosclerosis.   Electronically Signed   By: Kerby Moors M.D.   On: 03/04/2015 14:25   US Abdomen Limited  03/05/2015   CLINICAL DATA:  Ascites.  EXAM: LIMITED ABDOMEN ULTRASOUND FOR  ASCITES  TECHNIQUE: Limited ultrasound survey for ascites was performed in all four abdominal quadrants.  COMPARISON:  None.  FINDINGS: Small amount of fluid is noted around the liver. No other fluid is seen in the remaining quadrants of the abdomen.  IMPRESSION: Minimal ascites is noted around the liver.   Electronically Signed   By: Marijo Conception, M.D.   On: 03/05/2015 09:08   US Thoracentesis Asp Pleural Space W/img Guide  03/04/2015   INDICATION: Recurrent symptomatic right sided pleural effusion  EXAM: US THORACENTESIS ASP PLEURAL SPACE W/IMG GUIDE  COMPARISON:  Chest radiograph-earlier same day ; 12/24/2014; ultrasound-guided right-sided thoracentesis -12/24/2014  MEDICATIONS: None  COMPLICATIONS: None immediate  TECHNIQUE: Informed written consent was obtained from the patient after a discussion of the risks, benefits and alternatives to treatment. A timeout was performed prior to the initiation of the procedure.  Initial ultrasound scanning demonstrates a large pleural effusion. The lower chest was prepped and draped in the usual sterile fashion. 1% lidocaine was used for local anesthesia. An ultrasound image was saved for documentation purposes. An 8 Fr Safe-T-Centesis catheter was introduced. The thoracentesis was performed. The catheter was removed and a dressing was applied. The patient tolerated the procedure well without immediate post procedural complication. The patient was escorted to have an upright chest radiograph.  FINDINGS: A total of approximately 2.8 liters of serous fluid was removed. At this point, the patient developed a cough and asked that the thoracentesis be completed. Requested samples were sent to the laboratory.  Postprocedural imaging demonstrates a residual small to moderate size right-sided effusion.  IMPRESSION: Successful ultrasound-guided right sided thoracentesis yielding 2.8 liters of pleural fluid.  PLAN: Patient may return tomorrow for completion thoracentesis as  clinically indicated.   Electronically Signed   By: Sandi Mariscal M.D.   On: 03/04/2015 11:43    EKG: Normal sinus rhythm  ASSESSMENT AND PLAN:    74 year old gentleman who presents with respiratory failure, hypoxemia, recurrent right pleural effusion. Patient has a history of COPD as well as systolic congestive heart failure with echocardiogram revealing left ventricular dilatation and severely decreased left ventricular function.  Recommendations  1. Agree with overall current therapy 2. Continue diuresis 3. Consider changing atenolol to metoprolol succinate 4. Consider resuming ACE inhibitor 5. Counseled patient about low sodium diet  Signed: Sylina Henion MD,PhD, [facility] 03/05/2015, 4:57 PM

## 2015-03-05 NOTE — Care Management (Addendum)
Sitting up on the side of the bed eating breakfast. States he feels better today. Scheduled for 2-D echocardiogram  today,  States his 1st cousin will transport, when needed for discharge. Shelbie Ammons RN MSN Care Management [phone]

## 2015-03-05 NOTE — Plan of Care (Signed)
Problem: Discharge Progression Outcomes Goal: Other Discharge Outcomes/Goals Outcome: Progressing Plan of care progress: Respiratory insuff: -continuous pulse ox sats are 95% and higher -right thoracentesis yesterday -dyspnea on exertion, relieved at rest -o2 at 2L [place] continues -complaints of pain, PRN pain meds given with relief -PRN diazepam for muscle spasms with improvement

## 2015-03-05 NOTE — Progress Notes (Signed)
Pt is alert and oriented. Remaining on 2L of oxygen at this time. Using urinal without difficulty. NSR on telemetry monitor. Oxygen saturation remaining in the mid 90's. Back pain with improvement with prn medications.

## 2015-03-06 ENCOUNTER — Inpatient Hospital Stay: Payer: Medicare Other

## 2015-03-06 LAB — BASIC METABOLIC PANEL
Anion gap: 6 (ref 5–15)
BUN: 45 mg/dL — ABNORMAL HIGH (ref 6–20)
CO2: 25 mmol/L (ref 22–32)
Calcium: 8.3 mg/dL — ABNORMAL LOW (ref 8.9–10.3)
Chloride: 105 mmol/L (ref 101–111)
Creatinine, Ser: 1.93 mg/dL — ABNORMAL HIGH (ref 0.61–1.24)
GFR calc Af Amer: 38 mL/min — ABNORMAL LOW (ref >60)
GFR calc non Af Amer: 33 mL/min — ABNORMAL LOW (ref >60)
Glucose, Bld: 115 mg/dL — ABNORMAL HIGH (ref 65–99)
Potassium: 4.9 mmol/L (ref 3.5–5.1)
Sodium: 136 mmol/L (ref 135–145)

## 2015-03-06 MED ORDER — OXYCODONE HCL 5 MG PO TABS: 5.0000 mg | ORAL_TABLET | Freq: Two times a day (BID) | ORAL | Status: DC | PRN

## 2015-03-06 MED ORDER — TIOTROPIUM BROMIDE MONOHYDRATE 18 MCG IN CAPS: 18.0000 ug | ORAL_CAPSULE | Freq: Every day | RESPIRATORY_TRACT | Status: AC

## 2015-03-06 MED ORDER — METOPROLOL SUCCINATE ER 25 MG PO TB24: 25.0000 mg | ORAL_TABLET | Freq: Every day | ORAL | Status: DC

## 2015-03-06 MED ORDER — FUROSEMIDE 20 MG PO TABS: 40.0000 mg | ORAL_TABLET | Freq: Every day | ORAL | Status: DC

## 2015-03-06 MED ORDER — FUROSEMIDE 20 MG PO TABS
20.0000 mg | ORAL_TABLET | Freq: Every day | ORAL | Status: DC
  Administered 2015-03-06: 20 mg via ORAL
  Filled 2015-03-06: qty 1

## 2015-03-06 MED ORDER — DIAZEPAM 5 MG PO TABS: 5.0000 mg | ORAL_TABLET | Freq: Two times a day (BID) | ORAL | Status: DC | PRN

## 2015-03-06 MED ORDER — FLUTICASONE-SALMETEROL 100-50 MCG/DOSE IN AEPB: 1.0000 | INHALATION_SPRAY | Freq: Two times a day (BID) | RESPIRATORY_TRACT | Status: DC

## 2015-03-06 NOTE — Discharge Instructions (Addendum)
DIET:  Cardiac diet  DISCHARGE CONDITION:  Stable  ACTIVITY:  Activity as tolerated  OXYGEN:  Home Oxygen: No    DISCHARGE LOCATION:  home   If you experience worsening of your admission symptoms, develop shortness of breath, life threatening emergency, suicidal or homicidal thoughts you must seek medical attention immediately by calling 911 or calling your MD immediately  if symptoms less severe.  You Must read complete instructions/literature along with all the possible adverse reactions/side effects for all the Medicines you take and that have been prescribed to you. Take any new Medicines after you have completely understood and accpet all the possible adverse reactions/side effects.   Please note  You were cared for by a hospitalist during your hospital stay. If you have any questions about your discharge medications or the care you received while you were in the hospital after you are discharged, you can call the unit and asked to speak with the hospitalist on call if the hospitalist that took care of you is not available. Once you are discharged, your primary care physician will handle any further medical issues. Please note that NO REFILLS for any discharge medications will be authorized once you are discharged, as it is imperative that you return to your primary care physician (or establish a relationship with a primary care physician if you do not have one) for your aftercare needs so that they can reassess your need for medications and monitor your lab values.  DAILY FLUIDS LESS THAN 2 LITERS. LOW SALT DIET.  CHECK WEIGHT EVERYDAY  TAKE EXTRA DOSE OF LASIX IF WEIGHT GAIN MORE THAN 3 LBS    Aspiration Precautions Aspiration is the inhaling of a liquid or object into the lungs. Things that can be inhaled into the lungs include:  Food.  Any type of liquid, such as drinks or saliva.  Stomach contents, such as vomit or stomach acid. When these things go into the lungs,  damage can occur. Serious complications can then result, such as:  A lung infection (pneumonia).  A collection of pus in the lungs (lung abscess). CAUSES  A decreased level of awareness (consciousness) due to:  Traumatic brain injury or head injury.  Stroke.  Neurological disease.  Seizures.  Decreased or absent gag reflex (inability to cough).  Medical conditions that affect swallowing.  Conditions that affect the food pipe (esophagus) such as a narrowing of the esophagus (esophageal stricture).  Gastroesophageal reflux (GERD). This is also known as acid reflux.  Any type of surgery where you are put under general anesthesia or have sedation.  Drinking large amounts of alcohol.  Taking medication that causes drowsiness, confusion, or weakness.  Aging.  Dental problems.  Having a feeding tube. SYMPTOMS When aspiration occurs, different signs and symptoms can occur, such as:  Coughing (if a person has a cough or gag reflex) after swallowing food or liquids.  Difficulty breathing. This can include things like:  Breathing rapidly.  Breathing very slowly.  Loud breathing.  Hearing "gurgling" lung sounds when a person breaths.  Coughing up phlegm (sputum) that is:  Yellow, tan, or green in color.  Has pieces of food in it.  Bad smelling.  A change in voice (hoarseness) or a "gurgly" sound to the voice.  A change in skin color. The skin may turn red, or a "bluish" type color because of a lack of oxygen (cyanosis).  Fever.  Eyes watering.  Pain in the chest or back.  Facial grimacing .  A feeling  of fullness in the throat or that something is stuck in the throat. DIAGNOSIS  A chest X-ray may be performed. This takes a picture of your lungs. It can show changes in the lungs if aspiration has occurred.  A bronchoscopy may be performed. This is a surgical procedure in which a thin, flexible tube with a camera at the end is inserted into the nose or  mouth. The tube is advanced to the lungs so your health care provider can view the lungs and obtain a culture, tissue sample, or remove an aspirated object.  A swallowing evaluation study may be performed to evaluate:  A person's risk of aspiration.  How difficult it is for a person to swallow.  What types of foods are safe for a person to eat. PREVENTION If you are a caregiver to someone who may aspirate, follow the directions below. If you are caring for someone who can eat and drink through their mouth:  Have them sit in an upright position when eating food or drinking fluids, such as:  Sitting up in a chair.  If sitting in a chair is not possible, position the person in bed so they are upright.  Remind the person to eat slowly and chew well.  Do not distract the person. This is especially important for people with thinking or memory (cognitive) problems.  Check the person's mouth for leftover food after eating.  Keep the person sitting upright for 30 to 45 minutes after eating.  Do not serve food or drink for at least 2 hours before bedtime. If you are caring for someone with a feeding tube and he or she cannot eat or drink through their mouth:  Keep the person in an upright position as much as possible.  Do not  lay the person flat if they are getting continuous feedings. Turn the feeding pump off if you need to lay the person flat for any reason.  Check feeding tube residuals as directed by your health care provider. If a large amount of tube feedings are pulled back (aspirated) from the feeding tube, call your health care provider right away. General guidelines to prevent aspiration include:  Feed small amounts of food. Do not force feed.  Use as little water as possible when brushing the person's teeth or cleaning his or her mouth.  Provide oral care before and after meals.  Never put food or fluids in the mouth of a person who is not fully alert.  Crush pills and  put them in soft food such as pudding or ice cream. Some pills should not be crushed. Check with your health care provider before crushing any medication. SEEK IMMEDIATE MEDICAL CARE IF:   The person has trouble breathing or starts to breathe rapidly.  The person is breathing very slowly or stops breathing.  The person coughs a lot after eating or drinking.  The person has a chronic cough.  The person coughs up thick, yellow, or tan sputum.  The person has a fever or persistent symptoms for more than 72 hours.  The person has a fever and their symptoms suddenly get worse. Document Released: 09/30/2010 Document Revised: 09/02/2013 Document Reviewed: 12/03/2013 [facility] Patient Information 2015 [place], [place]. This information is not intended to replace advice given to you by your health care provider. Make sure you discuss any questions you have with your health care provider.   Acute Respiratory Failure Respiratory failure is when your lungs are not working well and your breathing (respiratory)  system fails. When respiratory failure occurs, it is difficult for your lungs to get enough oxygen, get rid of carbon dioxide, or both. Respiratory failure can be life threatening.  Respiratory failure can be acute or chronic. Acute respiratory failure is sudden, severe, and requires emergency medical treatment. Chronic respiratory failure is less severe, happens over time, and requires ongoing treatment.  WHAT ARE THE CAUSES OF ACUTE RESPIRATORY FAILURE?  Any problem affecting the heart or lungs can cause acute respiratory failure. Some of these causes include the following:  Chronic bronchitis and emphysema (COPD).   Blood clot going to a lung (pulmonary embolism).   Having water in the lungs caused by heart failure, lung injury, or infection (pulmonary edema).   Collapsed lung (pneumothorax).   Pneumonia.   Pulmonary fibrosis.   Obesity.   Asthma.   Heart failure.   Any  type of trauma to the chest that can make breathing difficult.   Nerve or muscle diseases making chest movements difficult. WHAT SYMPTOMS SHOULD YOU WATCH FOR?  If you have any of these signs or symptoms, you should seek immediate medical care:   You have shortness of breath (dyspnea) with or without activity.   You have rapid, fast breathing (tachypnea).   You are wheezing.  You are unable to say more than a few words without having to catch your breath.  You find it very difficult to function normally.  You have a fast heart rate.   You have a bluish color to your finger or toe nail beds.   You have confusion or drowsiness or both.  HOW WILL MY ACUTE RESPIRATORY FAILURE BE TREATED?  Treatment of acute respiratory failure depends on the cause of the respiratory failure. Usually, you will stay in the intensive care unit so your breathing can be watched closely. Treatment can include the following:  Oxygen. Oxygen can be delivered through the following:  Nasal cannula. This is small tubing that goes in your nose to give you oxygen.  Face mask. A face mask covers your nose and mouth to give you oxygen.  Medicine. Different medicines can be given to help with breathing. These can include:  Nebulizers. Nebulizers deliver medicines to open the air passages (bronchodilators). These medicines help to open or relax the airways in the lungs so you can breathe better. They can also help loosen mucus from your lungs.  Diuretics. Diuretic medicines can help you breathe better by getting rid of extra water in your body.  Steroids. Steroid medicines can help decrease swelling (inflammation) in your lungs.  Antibiotics.  Chest tube. If you have a collapsed lung (pneumothorax), a chest tube is placed to help reinflate the lung.  Non-invasive positive pressure ventilation (NPPV). This is a tight-fitting mask that goes over your nose and mouth. The mask has tubing that is attached to a  machine. The machine blows air into the tubing, which helps to keep the tiny air sacs (alveoli) in your lungs open. This machine allows you to breathe on your own.  Ventilator. A ventilator is a breathing machine. When on a ventilator, a breathing tube is put into the lungs. A ventilator is used when you can no longer breathe well enough on your own. You may have low oxygen levels or high carbon dioxide (CO2) levels in your blood. When you are on a ventilator, sedation and pain medicines are given to make you sleep so your lungs can heal. Document Released: 09/02/2013 Document Revised: 01/12/2014 Document Reviewed: 09/02/2013 [employer] Patient  Information 2015 [place], [place]. This information is not intended to replace advice given to you by your health care provider. Make sure you discuss any questions you have with your health care provider.

## 2015-03-06 NOTE — Plan of Care (Signed)
Problem: Discharge Progression Outcomes Goal: Discharge plan in place and appropriate Outcome: Progressing Individualization:   Pt prefers to be called Collin [name] who lives at home.   Hx COPD, CHF, HTN, Gerd, Hyperlipidemia, & chronic back pain controlled by home medications.   High fall risk. Bed alarm on, hourly rounding with toileting offered. Pt shows understanding how to use call system. Goal: Other Discharge Outcomes/Goals Outcome: Progressing Pt up to chair with standby assist, uses urinal at bedside. O2 sats 97% on 2L O2. No complaints of pain or discomfort.

## 2015-03-06 NOTE — Progress Notes (Addendum)
MD aware of O2 sats on room air at rest and exertion. Patient discharged home per MD order. Discharge instructions given to patient. All questions answered. Patient verbalized understanding. Left via wheelchair with nursing staff.

## 2015-03-08 LAB — BODY FLUID CULTURE
CULTURE: NO GROWTH
GRAM STAIN: NONE SEEN

## 2015-03-08 LAB — CYTOLOGY - NON PAP

## 2015-03-08 NOTE — Discharge Summary (Signed)
[place] [facility] [place] NAME: Collin [name]    MR#:  [id number]  DATE OF BIRTH:  1940/12/16  DATE OF ADMISSION:  03/04/2015 ADMITTING PHYSICIAN: Lance Coon, MD  DATE OF DISCHARGE: 03/06/2015 11:15 AM  PRIMARY CARE PHYSICIAN: [name], MD    ADMISSION DIAGNOSIS:  Pleural effusion, right [J94.8]  DISCHARGE DIAGNOSIS:  Principal Problem:   Acute respiratory failure with hypoxemia Active Problems:   Recurrent right pleural effusion   COPD (chronic obstructive pulmonary disease)   HLD (hyperlipidemia)   Chronic systolic CHF (congestive heart failure)   HTN (hypertension)   GERD (gastroesophageal reflux disease)   SECONDARY DIAGNOSIS:   Past Medical History  Diagnosis Date  . COPD (chronic obstructive pulmonary disease)   . Chronic systolic CHF (congestive heart failure)   . Kidney stone   . Macular degeneration   . HLD (hyperlipidemia)   . Hypertensive retinopathy   . HTN (hypertension)   . GERD (gastroesophageal reflux disease)      ADMITTING HISTORY  Collin [name] is a 74 y.o. male who presents with acute respiratory failure with hypoxemia. Patient was here in the recent past with right-sided pleural effusion, and had 4 L drawn off with improvement in his breathing at that time. However since then, he states he never got back to baseline breathing and came to the ED tonight as his shortness of breath has gotten significantly worse, progressively. Here chest x-ray showed reaccumulation of right-sided pleural effusion. Patient required supplemental oxygen to bring his sats back up to a more normal level, he was satting the high 70s to low 80s when he came to the ED. He denies any recent symptoms of infection, see full review of systems below. Hospitalists were called for evaluation and treatment of his pleural effusion, and his respiratory failure.  HOSPITAL COURSE:   74 year old male patient with tobacco abuse, COPD,  CKD stage III, hypertension, chronic systolic congestive heart failure here with SOB  * Recurrent right pleural effusion - status post thoracentesis. 2.5 L of serous fluid removed. Likely from CHF. No PNA. Appreciate pulmonary help. Repeat CXR on day of discharge showed no reaccumulation of fluid. Labs suggested transudate.  * COPD No wheezing. Continue oxygen and home inhalers along with nebulizers as needed.  * CKD stage III. Creatinine stable.  * Acute on Chronic systolic congestive heart failure Ration was on high-dose Lasix in the hospital. Pleural effusion did not reaccumulate after thoracentesis. Started on Lasix at time of discharge. Counseled patient to decreased fluid intake. Daily weights.  On day of discharge patient ambulated with saturations of 94% on room air. Stable at time of discharge.   CONSULTS OBTAINED:  Treatment Team:  Allyne Gee, MD Isaias Cowman, MD  DRUG ALLERGIES:   Allergies  Allergen Reactions  . Lisinopril Swelling    Pt reports throat swelling.    DISCHARGE MEDICATIONS:   Discharge Medication List as of 03/06/2015 10:16 AM    START taking these medications   Details  diazepam (VALIUM) 5 MG tablet Take 1 tablet (5 mg total) by mouth every 12 (twelve) hours as needed for muscle spasms., Starting 03/06/2015, Until Discontinued, Print    Fluticasone-Salmeterol (ADVAIR DISKUS) 100-50 MCG/DOSE AEPB Inhale 1 puff into the lungs 2 (two) times daily., Starting 03/06/2015, Until Discontinued, Print    metoprolol succinate (TOPROL XL) 25 MG 24 hr tablet Take 1 tablet (25 mg total) by mouth daily., Starting 03/06/2015, Until Discontinued, Print    oxyCODONE (  OXY IR/ROXICODONE) 5 MG immediate release tablet Take 1 tablet (5 mg total) by mouth 2 (two) times daily as needed for severe pain., Starting 03/06/2015, Until Discontinued, Print    tiotropium (SPIRIVA HANDIHALER) 18 MCG inhalation capsule Place 1 capsule (18 mcg total) into inhaler and  inhale daily., Starting 03/06/2015, Until Discontinued, Print      CONTINUE these medications which have CHANGED   Details  furosemide (LASIX) 20 MG tablet Take 2 tablets (40 mg total) by mouth daily., Starting 03/06/2015, Until Discontinued, Print      CONTINUE these medications which have NOT CHANGED   Details  albuterol (PROVENTIL HFA;VENTOLIN HFA) 108 (90 BASE) MCG/ACT inhaler Inhale into the lungs every 6 (six) hours as needed for wheezing or shortness of breath., Until Discontinued, Historical Med    aspirin 81 MG tablet Take 81 mg by mouth daily., Until Discontinued, Historical Med    omeprazole (PRILOSEC) 20 MG capsule Take 20 mg by mouth 2 (two) times daily before a meal., Until Discontinued, Historical Med        Today    VITAL SIGNS:  Blood pressure 110/63, pulse 59, temperature 97.6 F (36.4 C), temperature source Oral, resp. rate 20, height 6\' 2"  (1.88 m), weight 82.736 kg (182 lb 6.4 oz), SpO2 92 %.  I/O:  No intake or output data in the 24 hours ending 03/08/15 2329  PHYSICAL EXAMINATION:  Physical Exam  GENERAL:  74 y.o.-year-old patient lying in the bed with no acute distress.  LUNGS: Normal breath sounds bilaterally, no wheezing, rales,rhonchi or crepitation. No use of accessory muscles of respiration.  CARDIOVASCULAR: S1, S2 normal. No murmurs, rubs, or gallops.  ABDOMEN: Soft, non-tender, non-distended. Bowel sounds present. No organomegaly or mass.  NEUROLOGIC: Moves all 4 extremities. PSYCHIATRIC: The patient is alert and oriented x 3.  SKIN: No obvious rash, lesion, or ulcer.   DATA REVIEW:   CBC  Recent Labs Lab 03/04/15 0050  WBC 10.9*  HGB 13.3  HCT 42.6  PLT 209    Chemistries   Recent Labs Lab 03/06/15 0521  NA 136  K 4.9  CL 105  CO2 25  GLUCOSE 115*  BUN 45*  CREATININE 1.93*  CALCIUM 8.3*    Cardiac Enzymes  Recent Labs Lab 03/04/15 0050  TROPONINI <0.03    Microbiology Results  Results for orders placed or  performed during the hospital encounter of 03/04/15  Body fluid culture     Status: None   Collection Time: 03/04/15 11:43 AM  Result Value Ref Range Status   Specimen Description PLEURAL  Final   Special Requests NONE  Final   Gram Stain NO WBC SEEN NO ORGANISMS SEEN   Final   Culture NO GROWTH 4 DAYS  Final   Report Status 03/08/2015 FINAL  Final    RADIOLOGY:  No results found.    Follow up with PCP in 1 week.  Management plans discussed with the patient, family and they are in agreement.  CODE STATUS:   TOTAL TIME TAKING CARE OF THIS PATIENT ON DAY OF DISCHARGE: more than 30  minutes.    Hillary Bow R M.D on 03/08/2015 at 11:29 PM  Between 7am to 6pm - Pager - [phone]  After 6pm go to [email or web address] - password [email or web address] [facility]  Office  [phone]  CC: Primary care physician; [name], MD

## 2015-03-10 LAB — PH, BODY FLUID: PH, BODY FLUID: 7.8

## 2015-03-19 ENCOUNTER — Encounter: Payer: Self-pay | Admitting: Emergency Medicine

## 2015-03-19 ENCOUNTER — Emergency Department: Payer: Medicaid Other

## 2015-03-19 ENCOUNTER — Observation Stay
Admission: EM | Admit: 2015-03-19 | Discharge: 2015-03-21 | Disposition: A | Discharge status: Home / Self Care | Payer: Medicaid Other | Attending: Internal Medicine | Admitting: Internal Medicine

## 2015-03-19 DIAGNOSIS — R0602 Shortness of breath: Secondary | ICD-10-CM | POA: Insufficient documentation

## 2015-03-19 DIAGNOSIS — J9611 Chronic respiratory failure with hypoxia: Secondary | ICD-10-CM | POA: Insufficient documentation

## 2015-03-19 DIAGNOSIS — I10 Essential (primary) hypertension: Secondary | ICD-10-CM | POA: Diagnosis present

## 2015-03-19 DIAGNOSIS — I5023 Acute on chronic systolic (congestive) heart failure: Secondary | ICD-10-CM | POA: Diagnosis present

## 2015-03-19 DIAGNOSIS — G8929 Other chronic pain: Secondary | ICD-10-CM | POA: Insufficient documentation

## 2015-03-19 DIAGNOSIS — Z7982 Long term (current) use of aspirin: Secondary | ICD-10-CM | POA: Insufficient documentation

## 2015-03-19 DIAGNOSIS — E785 Hyperlipidemia, unspecified: Secondary | ICD-10-CM | POA: Diagnosis present

## 2015-03-19 DIAGNOSIS — I429 Cardiomyopathy, unspecified: Secondary | ICD-10-CM | POA: Insufficient documentation

## 2015-03-19 DIAGNOSIS — N183 Chronic kidney disease, stage 3 (moderate): Secondary | ICD-10-CM | POA: Diagnosis not present

## 2015-03-19 DIAGNOSIS — L97421 Non-pressure chronic ulcer of left heel and midfoot limited to breakdown of skin: Secondary | ICD-10-CM

## 2015-03-19 DIAGNOSIS — L97429 Non-pressure chronic ulcer of left heel and midfoot with unspecified severity: Secondary | ICD-10-CM | POA: Diagnosis not present

## 2015-03-19 DIAGNOSIS — I509 Heart failure, unspecified: Secondary | ICD-10-CM | POA: Diagnosis not present

## 2015-03-19 DIAGNOSIS — I129 Hypertensive chronic kidney disease with stage 1 through stage 4 chronic kidney disease, or unspecified chronic kidney disease: Secondary | ICD-10-CM | POA: Diagnosis not present

## 2015-03-19 DIAGNOSIS — H35039 Hypertensive retinopathy, unspecified eye: Secondary | ICD-10-CM | POA: Insufficient documentation

## 2015-03-19 DIAGNOSIS — I5022 Chronic systolic (congestive) heart failure: Secondary | ICD-10-CM

## 2015-03-19 DIAGNOSIS — I739 Peripheral vascular disease, unspecified: Secondary | ICD-10-CM | POA: Insufficient documentation

## 2015-03-19 DIAGNOSIS — J449 Chronic obstructive pulmonary disease, unspecified: Secondary | ICD-10-CM | POA: Diagnosis not present

## 2015-03-19 DIAGNOSIS — L97909 Non-pressure chronic ulcer of unspecified part of unspecified lower leg with unspecified severity: Secondary | ICD-10-CM | POA: Diagnosis not present

## 2015-03-19 DIAGNOSIS — H3530 Unspecified macular degeneration: Secondary | ICD-10-CM | POA: Diagnosis not present

## 2015-03-19 DIAGNOSIS — K219 Gastro-esophageal reflux disease without esophagitis: Secondary | ICD-10-CM | POA: Diagnosis present

## 2015-03-19 DIAGNOSIS — S91302A Unspecified open wound, left foot, initial encounter: Secondary | ICD-10-CM | POA: Diagnosis present

## 2015-03-19 DIAGNOSIS — J811 Chronic pulmonary edema: Secondary | ICD-10-CM | POA: Diagnosis not present

## 2015-03-19 DIAGNOSIS — J9601 Acute respiratory failure with hypoxia: Secondary | ICD-10-CM | POA: Insufficient documentation

## 2015-03-19 DIAGNOSIS — J90 Pleural effusion, not elsewhere classified: Principal | ICD-10-CM

## 2015-03-19 DIAGNOSIS — F419 Anxiety disorder, unspecified: Secondary | ICD-10-CM | POA: Diagnosis not present

## 2015-03-19 DIAGNOSIS — F17210 Nicotine dependence, cigarettes, uncomplicated: Secondary | ICD-10-CM | POA: Diagnosis not present

## 2015-03-19 DIAGNOSIS — Z9981 Dependence on supplemental oxygen: Secondary | ICD-10-CM | POA: Diagnosis not present

## 2015-03-19 DIAGNOSIS — Z9889 Other specified postprocedural states: Secondary | ICD-10-CM

## 2015-03-19 DIAGNOSIS — I501 Left ventricular failure: Secondary | ICD-10-CM

## 2015-03-19 DIAGNOSIS — S91309A Unspecified open wound, unspecified foot, initial encounter: Secondary | ICD-10-CM | POA: Insufficient documentation

## 2015-03-19 DIAGNOSIS — Z87442 Personal history of urinary calculi: Secondary | ICD-10-CM | POA: Diagnosis not present

## 2015-03-19 DIAGNOSIS — J410 Simple chronic bronchitis: Secondary | ICD-10-CM | POA: Diagnosis not present

## 2015-03-19 DIAGNOSIS — I517 Cardiomegaly: Secondary | ICD-10-CM | POA: Diagnosis not present

## 2015-03-19 LAB — CBC
HCT: 42.5 % (ref 40.0–52.0)
HEMOGLOBIN: 13.5 g/dL (ref 13.0–18.0)
MCH: 25.1 pg — ABNORMAL LOW (ref 26.0–34.0)
MCHC: 31.7 g/dL — ABNORMAL LOW (ref 32.0–36.0)
MCV: 79.1 fL — ABNORMAL LOW (ref 80.0–100.0)
Platelets: 275 10*3/uL (ref 150–440)
RBC: 5.37 MIL/uL (ref 4.40–5.90)
RDW: 20 % — AB (ref 11.5–14.5)
WBC: 9.2 10*3/uL (ref 3.8–10.6)

## 2015-03-19 LAB — BASIC METABOLIC PANEL
ANION GAP: 11 (ref 5–15)
BUN: 31 mg/dL — ABNORMAL HIGH (ref 6–20)
CHLORIDE: 106 mmol/L (ref 101–111)
CO2: 20 mmol/L — AB (ref 22–32)
Calcium: 8.8 mg/dL — ABNORMAL LOW (ref 8.9–10.3)
Creatinine, Ser: 1.79 mg/dL — ABNORMAL HIGH (ref 0.61–1.24)
GFR calc non Af Amer: 36 mL/min — ABNORMAL LOW (ref >60)
GFR, EST AFRICAN AMERICAN: 42 mL/min — AB (ref >60)
Glucose, Bld: 111 mg/dL — ABNORMAL HIGH (ref 65–99)
POTASSIUM: 4.3 mmol/L (ref 3.5–5.1)
SODIUM: 137 mmol/L (ref 135–145)

## 2015-03-19 LAB — BRAIN NATRIURETIC PEPTIDE: B Natriuretic Peptide: >4500 pg/mL — ABNORMAL HIGH (ref 0.0–100.0)

## 2015-03-19 LAB — TROPONIN I: Troponin I: 0.03 ng/mL (ref <0.031)

## 2015-03-19 MED ORDER — FUROSEMIDE 10 MG/ML IJ SOLN
40.0000 mg | INTRAMUSCULAR | Status: AC
  Administered 2015-03-19: 40 mg via INTRAVENOUS

## 2015-03-19 MED ORDER — MORPHINE SULFATE 2 MG/ML IJ SOLN
INTRAMUSCULAR | Status: AC
  Administered 2015-03-19: 2 mg via INTRAVENOUS
  Filled 2015-03-19: qty 1

## 2015-03-19 MED ORDER — MORPHINE SULFATE 2 MG/ML IJ SOLN
2.0000 mg | Freq: Once | INTRAMUSCULAR | Status: AC
  Administered 2015-03-19: 2 mg via INTRAVENOUS

## 2015-03-19 MED ORDER — BACITRACIN ZINC 500 UNIT/GM EX OINT
TOPICAL_OINTMENT | CUTANEOUS | Status: AC
  Filled 2015-03-19: qty 0.9

## 2015-03-19 MED ORDER — FUROSEMIDE 10 MG/ML IJ SOLN
INTRAMUSCULAR | Status: AC
  Administered 2015-03-19: 40 mg via INTRAVENOUS
  Filled 2015-03-19: qty 4

## 2015-03-19 NOTE — ED Notes (Signed)
Pt to triage by wheelchair, sent here for "fluid on my lungs"; pt reports shortness of breath x2-3 days; pt does smoke cigarettes.

## 2015-03-19 NOTE — ED Provider Notes (Signed)
[facility] Emergency Department Provider Note  ____________________________________________  Time seen: Approximately 8:06 PM  I have reviewed the triage vital signs and the nursing notes.   HISTORY  Chief Complaint Shortness of Breath    HPI Collin [name] is a 74 y.o. male history of congestive heart failure with recurrent right-sided pleural effusion, COPD. Patient presents today with increasing shortness of breath. States this feels like it did 2 weeks ago and is admitted for fluid on the right lung with heavy drinking. He does report being compliant with Lasix. No addition he states he injured his foot a few days ago and a blister which is been tender over the left heel. He denies any chest pain, but does feel shortness of breath especially profound with walking.  Past Medical History  Diagnosis Date  . COPD (chronic obstructive pulmonary disease)   . Chronic systolic CHF (congestive heart failure)   . Kidney stone   . Macular degeneration   . HLD (hyperlipidemia)   . Hypertensive retinopathy   . HTN (hypertension)   . GERD (gastroesophageal reflux disease)     Patient Active Problem List   Diagnosis Date Noted  . Recurrent right pleural effusion 03/04/2015  . Acute respiratory failure with hypoxemia 03/04/2015  . COPD (chronic obstructive pulmonary disease) 03/04/2015  . HLD (hyperlipidemia) 03/04/2015  . Chronic systolic CHF (congestive heart failure) 03/04/2015  . HTN (hypertension) 03/04/2015  . GERD (gastroesophageal reflux disease) 03/04/2015    Past Surgical History  Procedure Laterality Date  . Abdominal aortic aneurysm repair    . Hernia repair    . Kidney stone surgery      Current Outpatient Rx  Name  Route  Sig  Dispense  Refill  . albuterol (PROVENTIL HFA;VENTOLIN HFA) 108 (90 BASE) MCG/ACT inhaler   Inhalation   Inhale into the lungs every 6 (six) hours as needed for wheezing or shortness of breath.         Marland Kitchen aspirin  81 MG tablet   Oral   Take 81 mg by mouth daily.         . diazepam (VALIUM) 5 MG tablet   Oral   Take 1 tablet (5 mg total) by mouth every 12 (twelve) hours as needed for muscle spasms.   20 tablet   0   . Fluticasone-Salmeterol (ADVAIR DISKUS) 100-50 MCG/DOSE AEPB   Inhalation   Inhale 1 puff into the lungs 2 (two) times daily.   60 each   0   . furosemide (LASIX) 20 MG tablet   Oral   Take 2 tablets (40 mg total) by mouth daily.   60 tablet   0   . metoprolol succinate (TOPROL XL) 25 MG 24 hr tablet   Oral   Take 1 tablet (25 mg total) by mouth daily.   30 tablet   0   . omeprazole (PRILOSEC) 20 MG capsule   Oral   Take 20 mg by mouth 2 (two) times daily before a meal.         . oxyCODONE (OXY IR/ROXICODONE) 5 MG immediate release tablet   Oral   Take 1 tablet (5 mg total) by mouth 2 (two) times daily as needed for severe pain.   20 tablet   0   . tiotropium (SPIRIVA HANDIHALER) 18 MCG inhalation capsule   Inhalation   Place 1 capsule (18 mcg total) into inhaler and inhale daily.   30 capsule   0     Allergies  Lisinopril  Family History  Problem Relation Age of Onset  . Heart attack Mother   . Cancer Father   . COPD Father     Social History History  Substance Use Topics  . Smoking status: Current Every Day Smoker    Types: Cigarettes  . Smokeless tobacco: Not on file  . Alcohol Use: No    Review of Systems Constitutional: No fever/chills Eyes: No visual changes. ENT: No sore throat. Cardiovascular: Denies chest pain. Respiratory: She history of present illness Gastrointestinal: No abdominal pain.  No nausea, no vomiting.  No diarrhea.  No constipation. Genitourinary: Negative for dysuria. Musculoskeletal: Chronic lower back pain no acute changes Skin: Negative for rash. Neurological: Negative for headaches, focal weakness or numbness.  10-point ROS otherwise negative.  ____________________________________________   PHYSICAL  EXAM:  VITAL SIGNS: ED Triage Vitals  Enc Vitals Group     BP 03/19/15 1718 154/100 mmHg     Pulse Rate 03/19/15 1718 114     Resp 03/19/15 1718 26     Temp 03/19/15 1718 97.7 F (36.5 C)     Temp Source 03/19/15 1718 Oral     SpO2 03/19/15 1718 91 %     Weight 03/19/15 1718 180 lb (81.647 kg)     Height 03/19/15 1718 6\' 2"  (1.88 m)     Head Cir --      Peak Flow --      Pain Score 03/19/15 1720 8     Pain Loc --      Pain Edu? --      Excl. in [place]? --     Constitutional: Alert and oriented. Mild increased work of breathing with mild use of accessory muscle speaking in short phrases. Eyes: Conjunctivae are normal. PERRL. EOMI. Head: Atraumatic. Nose: No congestion/rhinnorhea. Mouth/Throat: Mucous membranes are moist.  Oropharynx non-erythematous. Neck: No stridor.   Cardiovascular: Normal rate, regular rhythm. Grossly normal heart sounds.  Good peripheral circulation. Respiratory: Mild increased work of breathing, decreased lung sounds over the right lower lobe. Rales in the bases noted on the left. Gastrointestinal: Soft and nontender. No distention. No abdominal bruits. No CVA tenderness. Musculoskeletal: One plus lower extremity edema bilaterally. Patient's left heel has a very small healing ulcer over its lateral aspect, there is no associated erythema though there is some slight minimal tenderness in this region. Neurologic:  Normal speech and language. No gross focal neurologic deficits are appreciated. Speech is normal.  Skin:  Skin is warm, dry and intact. No rash noted. Psychiatric: Mood and affect are normal. Speech and behavior are normal.  ____________________________________________   LABS (all labs ordered are listed, but only abnormal results are displayed)  Labs Reviewed  CBC - Abnormal; Notable for the following:    MCV 79.1 (*)    MCH 25.1 (*)    MCHC 31.7 (*)    RDW 20.0 (*)    All other components within normal limits  BASIC METABOLIC PANEL -  Abnormal; Notable for the following:    CO2 20 (*)    Glucose, Bld 111 (*)    BUN 31 (*)    Creatinine, Ser 1.79 (*)    Calcium 8.8 (*)    GFR calc non Af Amer 36 (*)    GFR calc Af Amer 42 (*)    All other components within normal limits  BRAIN NATRIURETIC PEPTIDE - Abnormal; Notable for the following:    B Natriuretic Peptide >4500.0 (*)    All other components within normal  limits  TROPONIN I   ____________________________________________  EKG  EKG reviewed and interpreted by me Sinus tachycardia ventricular rate 1:15 Normal intervals except for slightly prolonged QT at 467 T-wave abnormality noted including T-wave depressions in V6. Possible old anterior infarct. No evidence of acute ST elevation.   Previous EKG from end of June 2016 no acute ischemic changes found. ____________________________________________  RADIOLOGY  CLINICAL DATA: Shortness of breath for 3 days. Congestive heart failure. Right pleural effusion.  EXAM: CHEST 2 VIEW  COMPARISON: 03/06/2015  FINDINGS: Cardiomegaly is stable. Increased interstitial infiltrates and Kerley B-lines are seen, consistent with increased interstitial edema. Moderate size right pleural effusion and right upper and lower lung atelectasis is increased since previous study. Previously seen small right pleural effusion has nearly completely resolved.  IMPRESSION: Increased diffuse pulmonary edema, right pleural effusion and right lung atelectasis, consistent with congestive heart failure.  Stable cardiomegaly. Near complete resolution of left pleural effusion. ____________________________________________   PROCEDURES  Procedure(s) performed: None  Critical Care performed: No  ____________________________________________   INITIAL IMPRESSION / ASSESSMENT AND PLAN / ED COURSE  Pertinent labs & imaging results that were available during my care of the patient were reviewed by me and considered in my medical  decision making (see chart for details).  Patient presents with shortness of breath, lower extremity edema which is recurrent and on chest x-ray recurrent right-sided pleural effusion with pulmonary edema. He has no wheezing, and based on his clinical presentation I feel he likely has recurrent congestive heart failure with recurrent right-sided pleural effusion.  Patient speaking in short phrases with mild use of accessory muscles. Presentation appears most consistent with acute CHF exacerbation likely similar to his previous admission with right-sided reaccumulation of pleural effusion. Lower IV Lasix, placed the patient on supplemental oxygen as his O2 levels are moderately reduced, but not truly hypoxic. Due to his increased work of breathing in anticipation that he will need a repeat thoracentesis will admit him to the hospital. I did discuss with interventional radiology, and interventional will be available to perform thoracentesis tomorrow if needed.  Due to the patient's increased work of breathing, we will need to admit him for observation and likely repeat thoracentesis and diuresis. Discussed the patient is agreeable with the plan. ____________________________________________   FINAL CLINICAL IMPRESSION(S) / ED DIAGNOSES  Final diagnoses:  Chronic systolic CHF (congestive heart failure)  Pulmonary edema cardiac cause  Recurrent pleural effusion on right   left heel small healing ulcer    Delman Kitten, MD 03/19/15 2104

## 2015-03-19 NOTE — H&P (Signed)
[facility] [place] NAME: Collin [name]    MR#:  [id number]  DATE OF BIRTH:  [birth year]  DATE OF ADMISSION:  03/19/2015  PRIMARY CARE PHYSICIAN: [name], MD   REQUESTING/REFERRING PHYSICIAN: Quale  CHIEF COMPLAINT:   Chief Complaint  Patient presents with  . Shortness of Breath    HISTORY OF PRESENT ILLNESS:  Collin [name]  is a 74 y.o. male who presents with shortness of breath. Patient has recently been admitted twice for right-sided pleural effusion requiring thoracentesis. He has a history of heart failure, and was told that his pleural effusion was due to his heart failure. He states that he's been compliant with his medications, has been trying to adhere to his low-salt diet, but that his effusion keeps on recurring. He began developing symptoms of significant worsening shortness of breath several days ago, and a progressive the point that he felt like he needed to come to the ED today. Here he was found on chest x-ray to have a significant right-sided pleural effusion which has reaccumulated since his last thoracentesis. He is also noted to have pulmonary edema, and his BNP was greater than 4500. Hospitalists were called for admission for CHF exacerbation and recurrent right pleural effusion.  PAST MEDICAL HISTORY:   Past Medical History  Diagnosis Date  . COPD (chronic obstructive pulmonary disease)   . Chronic systolic CHF (congestive heart failure)   . Kidney stone   . Macular degeneration   . HLD (hyperlipidemia)   . Hypertensive retinopathy   . HTN (hypertension)   . GERD (gastroesophageal reflux disease)     PAST SURGICAL HISTORY:   Past Surgical History  Procedure Laterality Date  . Abdominal aortic aneurysm repair    . Hernia repair    . Kidney stone surgery      SOCIAL HISTORY:   History  Substance Use Topics  . Smoking status: Current Every Day Smoker    Types: Cigarettes  . Smokeless tobacco:  Not on file  . Alcohol Use: No    FAMILY HISTORY:   Family History  Problem Relation Age of Onset  . Heart attack Mother   . Cancer Father   . COPD Father     DRUG ALLERGIES:   Allergies  Allergen Reactions  . Lisinopril Swelling    Pt reports throat swelling.    MEDICATIONS AT HOME:   Prior to Admission medications   Medication Sig Start Date End Date Taking? Authorizing Provider  albuterol (PROVENTIL HFA;VENTOLIN HFA) 108 (90 BASE) MCG/ACT inhaler Inhale into the lungs every 6 (six) hours as needed for wheezing or shortness of breath.    Historical Provider, MD  aspirin 81 MG tablet Take 81 mg by mouth daily.    Historical Provider, MD  diazepam (VALIUM) 5 MG tablet Take 1 tablet (5 mg total) by mouth every 12 (twelve) hours as needed for muscle spasms. 03/06/15   Srikar Sudini, MD  Fluticasone-Salmeterol (ADVAIR DISKUS) 100-50 MCG/DOSE AEPB Inhale 1 puff into the lungs 2 (two) times daily. 03/06/15   Hillary Bow, MD  furosemide (LASIX) 20 MG tablet Take 2 tablets (40 mg total) by mouth daily. 03/06/15   Hillary Bow, MD  metoprolol succinate (TOPROL XL) 25 MG 24 hr tablet Take 1 tablet (25 mg total) by mouth daily. 03/06/15   Srikar Sudini, MD  omeprazole (PRILOSEC) 20 MG capsule Take 20 mg by mouth 2 (two) times daily before a meal.    Historical Provider,  MD  oxyCODONE (OXY IR/ROXICODONE) 5 MG immediate release tablet Take 1 tablet (5 mg total) by mouth 2 (two) times daily as needed for severe pain. 03/06/15   Srikar Sudini, MD  tiotropium (SPIRIVA HANDIHALER) 18 MCG inhalation capsule Place 1 capsule (18 mcg total) into inhaler and inhale daily. 03/06/15   Hillary Bow, MD    REVIEW OF SYSTEMS:  Review of Systems  Constitutional: Negative for fever, chills, weight loss and malaise/fatigue.  HENT: Negative for ear pain, hearing loss and tinnitus.   Eyes: Negative for blurred vision, double vision, pain and redness.  Respiratory: Positive for cough, shortness of breath and  wheezing. Negative for hemoptysis.   Cardiovascular: Negative for chest pain, palpitations, orthopnea and leg swelling.  Gastrointestinal: Negative for nausea, vomiting, abdominal pain, diarrhea and constipation.  Genitourinary: Negative for dysuria, frequency and hematuria.  Musculoskeletal: Negative for back pain, joint pain and neck pain.  Skin:       Left open heel wound, No acne, rash  Neurological: Negative for dizziness, tremors, focal weakness and weakness.  Endo/Heme/Allergies: Negative for polydipsia. Does not bruise/bleed easily.  Psychiatric/Behavioral: Negative for depression. The patient is not nervous/anxious and does not have insomnia.      VITAL SIGNS:   Filed Vitals:   03/19/15 1718 03/19/15 2020  BP: 154/100   Pulse: 114   Temp: 97.7 F (36.5 C)   TempSrc: Oral   Resp: 26   Height: 6\' 2"  (1.88 m)   Weight: 81.647 kg (180 lb)   SpO2: 91% 93%   Wt Readings from Last 3 Encounters:  03/19/15 81.647 kg (180 lb)  03/06/15 82.736 kg (182 lb 6.4 oz)    PHYSICAL EXAMINATION:  Physical Exam  Constitutional: He is oriented to person, place, and time. He appears well-developed and well-nourished. No distress.  HENT:  Head: Normocephalic and atraumatic.  Mouth/Throat: Oropharynx is clear and moist.  Eyes: Conjunctivae and EOM are normal. Pupils are equal, round, and reactive to light. No scleral icterus.  Neck: Normal range of motion. Neck supple. JVD present. No thyromegaly present.  Cardiovascular: Regular rhythm and intact distal pulses.  Exam reveals no gallop and no friction rub.   No murmur heard. Tachycardic  Respiratory: Effort normal. No respiratory distress. He has wheezes. He has no rales.  Diminished breath sounds in the right base  GI: Soft. Bowel sounds are normal. He exhibits no distension. There is no tenderness.  Musculoskeletal: Normal range of motion. He exhibits edema (1+ bilateral lower extremity).  No arthritis, no gout  Lymphadenopathy:     He has no cervical adenopathy.  Neurological: He is alert and oriented to person, place, and time. No cranial nerve deficit.  No dysarthria, no aphasia  Skin: Skin is warm and dry. No rash noted. No erythema.  Left open heel wound, does not appear infected at this time.  Psychiatric: He has a normal mood and affect. His behavior is normal. Judgment and thought content normal.    LABORATORY PANEL:   CBC  Recent Labs Lab 03/19/15 1729  WBC 9.2  HGB 13.5  HCT 42.5  PLT 275   ------------------------------------------------------------------------------------------------------------------  Chemistries   Recent Labs Lab 03/19/15 1729  NA 137  K 4.3  CL 106  CO2 20*  GLUCOSE 111*  BUN 31*  CREATININE 1.79*  CALCIUM 8.8*   ------------------------------------------------------------------------------------------------------------------  Cardiac Enzymes  Recent Labs Lab 03/19/15 1729  TROPONINI 0.03   ------------------------------------------------------------------------------------------------------------------  RADIOLOGY:  Dg Chest 2 View  03/19/2015   CLINICAL DATA:  Shortness of breath for 3 days. Congestive heart failure. Right pleural effusion.  EXAM: CHEST  2 VIEW  COMPARISON:  03/06/2015  FINDINGS: Cardiomegaly is stable. Increased interstitial infiltrates and Kerley B-lines are seen, consistent with increased interstitial edema. Moderate size right pleural effusion and right upper and lower lung atelectasis is increased since previous study. Previously seen small right pleural effusion has nearly completely resolved.  IMPRESSION: Increased diffuse pulmonary edema, right pleural effusion and right lung atelectasis, consistent with congestive heart failure.  Stable cardiomegaly. Near complete resolution of left pleural effusion.   Electronically Signed   By: Earle Gell M.D.   On: 03/19/2015 19:49    EKG:   Orders placed or performed during the hospital encounter  of 03/19/15  . ED EKG (<49mins upon arrival to the ED)  . ED EKG (<68mins upon arrival to the ED)    IMPRESSION AND PLAN:  Principal Problem:   Recurrent right pleural effusion - admit plan for thoracentesis. Consult cardiology as below we'll also give IV dose of Lasix now. Active Problems:   Acute on chronic systolic CHF (congestive heart failure) - In exacerbation at this time, patient may need a higher dose of home diuretic. We'll consult cardiology for their opinion on this matter, images of IV Lasix now, follow-up closely on his status with daily weights.   COPD (chronic obstructive pulmonary disease) - Continue home medications   Open wound of left heel - Wound consult ordered   HLD (hyperlipidemia) - Stable problem, home meds   HTN (hypertension) - Continue home medications for this, can add IV when necessary antihypertensives if necessary.   GERD (gastroesophageal reflux disease) - Equivalent home dose PPI  All the records are reviewed and case discussed with ED provider. Management plans discussed with the patient and/or family.  DVT PROPHYLAXIS: SubQ heparin  ADMISSION STATUS: Observation  CODE STATUS: Full  TOTAL TIME TAKING CARE OF THIS PATIENT: 45 minutes.    Kiril Hippe FIELDING 03/19/2015, 9:39 PM  [facility]  Office  [phone]  CC: Primary care physician; [name], MD

## 2015-03-19 NOTE — ED Notes (Addendum)
Monitored o2 sats while walking patientt down the hall; patient's o2 sats stayed between 93-94%. Pt stated he felt out of breath afterwards.

## 2015-03-20 ENCOUNTER — Observation Stay: Payer: Medicaid Other

## 2015-03-20 DIAGNOSIS — S91302A Unspecified open wound, left foot, initial encounter: Secondary | ICD-10-CM | POA: Diagnosis not present

## 2015-03-20 DIAGNOSIS — J9611 Chronic respiratory failure with hypoxia: Secondary | ICD-10-CM | POA: Diagnosis not present

## 2015-03-20 DIAGNOSIS — I10 Essential (primary) hypertension: Secondary | ICD-10-CM | POA: Diagnosis not present

## 2015-03-20 DIAGNOSIS — J449 Chronic obstructive pulmonary disease, unspecified: Secondary | ICD-10-CM | POA: Diagnosis not present

## 2015-03-20 DIAGNOSIS — J90 Pleural effusion, not elsewhere classified: Secondary | ICD-10-CM | POA: Diagnosis not present

## 2015-03-20 DIAGNOSIS — I5023 Acute on chronic systolic (congestive) heart failure: Secondary | ICD-10-CM | POA: Diagnosis not present

## 2015-03-20 DIAGNOSIS — Z9889 Other specified postprocedural states: Secondary | ICD-10-CM | POA: Diagnosis not present

## 2015-03-20 LAB — CBC
HCT: 40.3 % (ref 40.0–52.0)
Hemoglobin: 12.6 g/dL — ABNORMAL LOW (ref 13.0–18.0)
MCH: 25.2 pg — ABNORMAL LOW (ref 26.0–34.0)
MCHC: 31.4 g/dL — ABNORMAL LOW (ref 32.0–36.0)
MCV: 80.2 fL (ref 80.0–100.0)
PLATELETS: 255 10*3/uL (ref 150–440)
RBC: 5.02 MIL/uL (ref 4.40–5.90)
RDW: 19.8 % — AB (ref 11.5–14.5)
WBC: 8.1 10*3/uL (ref 3.8–10.6)

## 2015-03-20 LAB — BASIC METABOLIC PANEL
Anion gap: 9 (ref 5–15)
BUN: 30 mg/dL — AB (ref 6–20)
CHLORIDE: 107 mmol/L (ref 101–111)
CO2: 24 mmol/L (ref 22–32)
Calcium: 9 mg/dL (ref 8.9–10.3)
Creatinine, Ser: 1.66 mg/dL — ABNORMAL HIGH (ref 0.61–1.24)
GFR calc Af Amer: 46 mL/min — ABNORMAL LOW (ref >60)
GFR calc non Af Amer: 40 mL/min — ABNORMAL LOW (ref >60)
Glucose, Bld: 86 mg/dL (ref 65–99)
Potassium: 3.8 mmol/L (ref 3.5–5.1)
Sodium: 140 mmol/L (ref 135–145)

## 2015-03-20 LAB — APTT: aPTT: 35 seconds (ref 24–36)

## 2015-03-20 LAB — PROTEIN, BODY FLUID: Total protein, fluid: <3 g/dL

## 2015-03-20 LAB — LACTATE DEHYDROGENASE, PLEURAL OR PERITONEAL FLUID: LD FL: 47 U/L — AB (ref 3–23)

## 2015-03-20 LAB — LACTATE DEHYDROGENASE: LDH: 150 U/L (ref 98–192)

## 2015-03-20 LAB — PROTIME-INR
INR: 1.12
PROTHROMBIN TIME: 14.6 s (ref 11.4–15.0)

## 2015-03-20 LAB — PROTEIN, TOTAL: Total Protein: 6.5 g/dL (ref 6.5–8.1)

## 2015-03-20 MED ORDER — FUROSEMIDE 20 MG PO TABS
20.0000 mg | ORAL_TABLET | Freq: Two times a day (BID) | ORAL | Status: DC
  Administered 2015-03-20: 20 mg via ORAL
  Filled 2015-03-20: qty 1

## 2015-03-20 MED ORDER — HEPARIN SODIUM (PORCINE) 5000 UNIT/ML IJ SOLN
5000.0000 [IU] | Freq: Three times a day (TID) | INTRAMUSCULAR | Status: DC
  Administered 2015-03-20 – 2015-03-21 (×3): 5000 [IU] via SUBCUTANEOUS
  Filled 2015-03-20 (×3): qty 1

## 2015-03-20 MED ORDER — TIOTROPIUM BROMIDE MONOHYDRATE 18 MCG IN CAPS
18.0000 ug | ORAL_CAPSULE | Freq: Every day | RESPIRATORY_TRACT | Status: DC
  Administered 2015-03-20 – 2015-03-21 (×2): 18 ug via RESPIRATORY_TRACT
  Filled 2015-03-20: qty 5

## 2015-03-20 MED ORDER — MOMETASONE FURO-FORMOTEROL FUM 100-5 MCG/ACT IN AERO
2.0000 | INHALATION_SPRAY | Freq: Two times a day (BID) | RESPIRATORY_TRACT | Status: DC
  Administered 2015-03-20 – 2015-03-21 (×3): 2 via RESPIRATORY_TRACT
  Filled 2015-03-20: qty 8.8

## 2015-03-20 MED ORDER — OXYCODONE HCL 5 MG PO TABS
5.0000 mg | ORAL_TABLET | ORAL | Status: DC | PRN
  Administered 2015-03-20 – 2015-03-21 (×7): 5 mg via ORAL
  Filled 2015-03-20 (×7): qty 1

## 2015-03-20 MED ORDER — PANTOPRAZOLE SODIUM 40 MG PO TBEC
40.0000 mg | DELAYED_RELEASE_TABLET | Freq: Two times a day (BID) | ORAL | Status: DC
  Administered 2015-03-20 – 2015-03-21 (×3): 40 mg via ORAL
  Filled 2015-03-20 (×3): qty 1

## 2015-03-20 MED ORDER — SODIUM CHLORIDE 0.9 % IJ SOLN
3.0000 mL | Freq: Two times a day (BID) | INTRAMUSCULAR | Status: DC
Start: 2015-03-20 — End: 2015-03-21
  Administered 2015-03-20 – 2015-03-21 (×3): 3 mL via INTRAVENOUS

## 2015-03-20 MED ORDER — ALBUTEROL SULFATE (2.5 MG/3ML) 0.083% IN NEBU: 3.0000 mL | INHALATION_SOLUTION | Freq: Four times a day (QID) | RESPIRATORY_TRACT | Status: DC | PRN

## 2015-03-20 MED ORDER — SENNOSIDES-DOCUSATE SODIUM 8.6-50 MG PO TABS
1.0000 | ORAL_TABLET | Freq: Every evening | ORAL | Status: DC | PRN
  Administered 2015-03-21: 1 via ORAL
  Filled 2015-03-20: qty 1

## 2015-03-20 MED ORDER — ACETAMINOPHEN 650 MG RE SUPP: 650.0000 mg | Freq: Four times a day (QID) | RECTAL | Status: DC | PRN

## 2015-03-20 MED ORDER — ASPIRIN EC 81 MG PO TBEC
81.0000 mg | DELAYED_RELEASE_TABLET | Freq: Every day | ORAL | Status: DC
  Administered 2015-03-21: 81 mg via ORAL
  Filled 2015-03-20 (×3): qty 1

## 2015-03-20 MED ORDER — OXYCODONE HCL 5 MG PO TABS
5.0000 mg | ORAL_TABLET | Freq: Two times a day (BID) | ORAL | Status: DC | PRN
  Administered 2015-03-20: 5 mg via ORAL
  Filled 2015-03-20 (×2): qty 1

## 2015-03-20 MED ORDER — ACETAMINOPHEN 325 MG PO TABS
650.0000 mg | ORAL_TABLET | Freq: Four times a day (QID) | ORAL | Status: DC | PRN
  Administered 2015-03-20 (×2): 650 mg via ORAL
  Filled 2015-03-20 (×2): qty 2

## 2015-03-20 MED ORDER — DIAZEPAM 5 MG PO TABS
5.0000 mg | ORAL_TABLET | Freq: Two times a day (BID) | ORAL | Status: DC | PRN
  Administered 2015-03-20: 5 mg via ORAL
  Filled 2015-03-20: qty 1

## 2015-03-20 MED ORDER — FUROSEMIDE 40 MG PO TABS
40.0000 mg | ORAL_TABLET | Freq: Two times a day (BID) | ORAL | Status: DC
  Administered 2015-03-20 – 2015-03-21 (×2): 40 mg via ORAL
  Filled 2015-03-20 (×2): qty 1

## 2015-03-20 NOTE — Procedures (Signed)
Right thoracentesis 2.4 L No comp No EBL

## 2015-03-20 NOTE — Progress Notes (Signed)
Returned from thoracentesis without complaints of pain or discomfort.  Pt states he is breathing a lot better.  Foot dressing done after Dr Leslye Peer looked at it.  Very red with the appearance of PVD.  Pt states that it is very painful.  Dressing changed, wound cleaned and dressed.  Pain med to be given

## 2015-03-20 NOTE — Progress Notes (Signed)
Pt alert and oriented x4,complaints of pain and discomfort of the feet and back.  Wants to wait to go to the thoracentesis before taking pain med.  Bed in low position, call bell within reach.  Bed alarms on and functioning.  Assessment done and charted.  Will continue to monitor and do hourly rounding throughout the shift.  Explained the procedure of the thoracentesis to the pt and pt is very verbalized understanding of the procedure.  Ultrasound called and was told to have pt ready at 9:30am and i asked about giving meds and [place] told me to hold the med.

## 2015-03-20 NOTE — Plan of Care (Signed)
Problem: Phase I Progression Outcomes Goal: EF % per last Echo/documented,Core Reminder form on chart Outcome: Not Met (add Reason) uncertain Goal: Initial discharge plan identified Outcome: Progressing Discharge to home - anticipated 03/23/15

## 2015-03-20 NOTE — Progress Notes (Signed)
Patient ID: Collin [name], male   DOB: 16-Dec-1940, 74 y.o.   MRN: [id number] [facility] PROGRESS NOTE  PCP: [name], MD  HPI/Subjective: Patient feeling that his breathing is a little bit better after thoracentesis. He states that he has had 2 other thoracentesis. He felt this coming on about for 5 days having shortness of breath. He also complains of pain in his left heel and pain in his calves when he walks.  Objective: Filed Vitals:   03/20/15 1230  BP: 147/95  Pulse: 91  Temp:   Resp: 18    Intake/Output Summary (Last 24 hours) at 03/20/15 1337 Last data filed at 03/20/15 1142  Gross per 24 hour  Intake    240 ml  Output   1250 ml  Net  -1010 ml   Filed Weights   03/19/15 1718 03/20/15 0024 03/20/15 0500  Weight: 81.647 kg (180 lb) 79.969 kg (176 lb 4.8 oz) 79.153 kg (174 lb 8 oz)    ROS: Review of Systems  Constitutional: Negative for fever and chills.  Eyes: Negative for blurred vision.  Respiratory: Positive for cough and shortness of breath.   Cardiovascular: Negative for chest pain.  Gastrointestinal: Negative for nausea, vomiting, abdominal pain, diarrhea and constipation.  Genitourinary: Negative for dysuria.  Musculoskeletal: Positive for myalgias and joint pain.  Neurological: Negative for dizziness and headaches.   Exam: Physical Exam  Constitutional: He is oriented to person, place, and time.  HENT:  Nose: No mucosal edema.  Mouth/Throat: No oropharyngeal exudate or posterior oropharyngeal edema.  Eyes: Conjunctivae, EOM and lids are normal. Pupils are equal, round, and reactive to light.  Neck: No JVD present. Carotid bruit is not present. No edema present. No thyroid mass and no thyromegaly present.  Cardiovascular: S1 normal and S2 normal.  Exam reveals no gallop.   Murmur heard.  Systolic murmur is present with a grade of 2/6  Pulses:      Dorsalis pedis pulses are 2+ on the right side, and 2+ on the left side.   Respiratory: No respiratory distress. He has decreased breath sounds in the right lower field. He has no wheezes. He has no rhonchi. He has no rales.  GI: Soft. Bowel sounds are normal. There is no tenderness.  Musculoskeletal:       Right ankle: He exhibits swelling.       Left ankle: He exhibits swelling.  Lymphadenopathy:    He has no cervical adenopathy.  Neurological: He is alert and oriented to person, place, and time. No cranial nerve deficit.  Skin: Skin is warm. No rash noted. Nails show no clubbing.  Left lateral heel small ulcer about the size of a nickel  Psychiatric: He has a normal mood and affect.    Data Reviewed: Basic Metabolic Panel:  Recent Labs Lab 03/19/15 1729 03/20/15 0414  NA 137 140  K 4.3 3.8  CL 106 107  CO2 20* 24  GLUCOSE 111* 86  BUN 31* 30*  CREATININE 1.79* 1.66*  CALCIUM 8.8* 9.0   CBC:  Recent Labs Lab 03/19/15 1729 03/20/15 0414  WBC 9.2 8.1  HGB 13.5 12.6*  HCT 42.5 40.3  MCV 79.1* 80.2  PLT 275 255   Cardiac Enzymes:  Recent Labs Lab 03/19/15 1729  TROPONINI 0.03   BNP (last 3 results)  Recent Labs  03/19/15 1729  BNP >4500.0*    Studies: Dg Chest 2 View  03/19/2015   CLINICAL DATA:  Shortness of breath for 3  days. Congestive heart failure. Right pleural effusion.  EXAM: CHEST  2 VIEW  COMPARISON:  03/06/2015  FINDINGS: Cardiomegaly is stable. Increased interstitial infiltrates and Kerley B-lines are seen, consistent with increased interstitial edema. Moderate size right pleural effusion and right upper and lower lung atelectasis is increased since previous study. Previously seen small right pleural effusion has nearly completely resolved.  IMPRESSION: Increased diffuse pulmonary edema, right pleural effusion and right lung atelectasis, consistent with congestive heart failure.  Stable cardiomegaly. Near complete resolution of left pleural effusion.   Electronically Signed   By: Earle Gell M.D.   On: 03/19/2015 19:49    US Thoracentesis Asp Pleural Space W/img Guide  03/20/2015   CLINICAL DATA:  Right pleural effusion  EXAM: ULTRASOUND GUIDED RIGHT THORACENTESIS  COMPARISON:  None.  PROCEDURE: An ultrasound guided thoracentesis was thoroughly discussed with the patient and questions answered. The benefits, risks, alternatives and complications were also discussed. The patient understands and wishes to proceed with the procedure. Written consent was obtained.  Ultrasound was performed to localize and mark an adequate pocket of fluid in the right chest. The area was then prepped and draped in the normal sterile fashion. 1% Lidocaine was used for local anesthesia. Under ultrasound guidance a Safe-T-Centesis catheter was introduced. Thoracentesis was performed. The catheter was removed and a dressing applied.  COMPLICATIONS: None.  FINDINGS: A total of approximately 2.4 L of clear yellow fluid was removed. A fluid sample was not sent for laboratory analysis.  IMPRESSION: Successful ultrasound guided right thoracentesis yielding 2.4 L of pleural fluid.   Electronically Signed   By: Marybelle Killings M.D.   On: 03/20/2015 12:37    Scheduled Meds: . aspirin EC  81 mg Oral Daily  . furosemide  40 mg Oral BID  . heparin  5,000 Units Subcutaneous 3 times per day  . mometasone-formoterol  2 puff Inhalation BID  . pantoprazole  40 mg Oral BID  . sodium chloride  3 mL Intravenous Q12H  . tiotropium  18 mcg Inhalation Daily    Assessment/Plan:  1. Recurrent right pleural effusion. Patient is status post thoracentesis of 2.4 L. I was looking back at old testing on the pleural fluid. It was negative for malignancy. It looks more consistent as an exudate but I do not have a serum LDH in his records. Likely this is from congestive heart failure. 2. 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 Patient is status post right thoracentesis. I will increase Lasix dose to 40 mg orally twice a day. 3. Chronic kidney disease  stage III continue to monitor with diuresis. 4. Peripheral vascular disease with claudication. Patient will end up needing to see vascular surgery as outpatient. 5. Left heel ulcer- no signs of infection. 6. Essential hypertension- blood pressure slightly high continue to monitor. 7. Gastroesophageal reflux disease without esophagitis on Protonix. 8. History of kidney stones. 9. Chronic pain- on oxycodone. 10. COPD continue Advair and Spiriva.  Code Status:     Code Status Orders        Start     Ordered   03/20/15 0021  Full code   Continuous     03/20/15 0020     Disposition Plan: home possibly tomorrow  Time spent: 95minutes  Loletha Grayer  [facility]

## 2015-03-20 NOTE — Care Management Note (Signed)
Case Management Note  Patient Details  Name: Collin [name] MRN: [id number] Date of Birth: August 28, 1941  Subjective/Objective:        Reviewed Medicare Observation Status Notification with Collin [name], provided him with a copy, and filed the signed original in Collin [name]'s chart.            Action/Plan:   Expected Discharge Date:  03/22/15               Expected Discharge Plan:     In-House Referral:     Discharge planning Services     Post Acute Care Choice:    Choice offered to:     DME Arranged:    DME Agency:     HH Arranged:    HH Agency:     Status of Service:     Medicare Important Message Given:    Date Medicare IM Given:    Medicare IM give by:    Date Additional Medicare IM Given:    Additional Medicare Important Message give by:     If discussed at [place] of Stay Meetings, dates discussed:    Additional Comments:  Aalayah Riles A, RN 03/20/2015, 5:40 PM

## 2015-03-21 DIAGNOSIS — S91302A Unspecified open wound, left foot, initial encounter: Secondary | ICD-10-CM | POA: Diagnosis not present

## 2015-03-21 DIAGNOSIS — I5023 Acute on chronic systolic (congestive) heart failure: Secondary | ICD-10-CM | POA: Diagnosis not present

## 2015-03-21 DIAGNOSIS — I10 Essential (primary) hypertension: Secondary | ICD-10-CM | POA: Diagnosis not present

## 2015-03-21 DIAGNOSIS — J449 Chronic obstructive pulmonary disease, unspecified: Secondary | ICD-10-CM | POA: Diagnosis not present

## 2015-03-21 DIAGNOSIS — J90 Pleural effusion, not elsewhere classified: Secondary | ICD-10-CM | POA: Diagnosis not present

## 2015-03-21 DIAGNOSIS — J9611 Chronic respiratory failure with hypoxia: Secondary | ICD-10-CM | POA: Diagnosis not present

## 2015-03-21 LAB — BASIC METABOLIC PANEL
Anion gap: 7 (ref 5–15)
BUN: 31 mg/dL — ABNORMAL HIGH (ref 6–20)
CHLORIDE: 104 mmol/L (ref 101–111)
CO2: 25 mmol/L (ref 22–32)
Calcium: 8.7 mg/dL — ABNORMAL LOW (ref 8.9–10.3)
Creatinine, Ser: 1.68 mg/dL — ABNORMAL HIGH (ref 0.61–1.24)
GFR calc non Af Amer: 39 mL/min — ABNORMAL LOW (ref >60)
GFR, EST AFRICAN AMERICAN: 45 mL/min — AB (ref >60)
Glucose, Bld: 107 mg/dL — ABNORMAL HIGH (ref 65–99)
POTASSIUM: 4 mmol/L (ref 3.5–5.1)
Sodium: 136 mmol/L (ref 135–145)

## 2015-03-21 MED ORDER — FUROSEMIDE 20 MG PO TABS: 40.0000 mg | ORAL_TABLET | Freq: Two times a day (BID) | ORAL | Status: DC

## 2015-03-21 MED ORDER — HYDROCOD POLST-CPM POLST ER 10-8 MG/5ML PO SUER
5.0000 mL | Freq: Two times a day (BID) | ORAL | Status: DC | PRN
  Administered 2015-03-21: 5 mL via ORAL
  Filled 2015-03-21: qty 5

## 2015-03-21 MED ORDER — OXYCODONE HCL 5 MG PO TABS: 5.0000 mg | ORAL_TABLET | Freq: Four times a day (QID) | ORAL | Status: DC | PRN

## 2015-03-21 MED ORDER — CLINDAMYCIN HCL 300 MG PO CAPS: 300.0000 mg | ORAL_CAPSULE | Freq: Three times a day (TID) | ORAL | Status: DC

## 2015-03-21 MED ORDER — DIAZEPAM 5 MG PO TABS: 5.0000 mg | ORAL_TABLET | Freq: Two times a day (BID) | ORAL | Status: DC | PRN

## 2015-03-21 NOTE — Progress Notes (Signed)
SATURATION QUALIFICATIONS: (This note is used to comply with regulatory documentation for home oxygen)  Patient Saturations on Room Air at Rest = 88%  Patient Saturations on Room Air while Ambulating = 83%  Patient Saturations on 2 Liters of oxygen while Ambulating = 91%  Please briefly explain why patient needs home oxygen: COPD, CHF, low SATS without oxygen

## 2015-03-21 NOTE — Progress Notes (Signed)
Pt. Complained of coughing, no active coughing observed, Dr. Jannifer [name] called and informed of pt request for cough medicine. New order received. Pt given cough medicine and prn pain medication per request and stated now I can sleep. Will continue to monitor pt.

## 2015-03-21 NOTE — Progress Notes (Signed)
Discharge instructions given. IV and tele discontinued. Patient instructed with handout on home oxygen use. CHF booklet given. Care management has been called for home oxygen, awaiting oxygen arrival from [facility]. Once oxygen arrives patient is ready for discharge.

## 2015-03-21 NOTE — Care Management Note (Signed)
Case Management Note  Patient Details  Name: Collin [name] MRN: [id number] Date of Birth: 25-Sep-1940  Subjective/Objective:           [facility] at [phone] and was told that a portable oxygen tank would be delivered to [facility] hospital room today and home oxygen set-up would follow.          Action/Plan:   Expected Discharge Date:  03/22/15               Expected Discharge Plan:     In-House Referral:     Discharge planning Services     Post Acute Care Choice:    Choice offered to:     DME Arranged:    DME Agency:     HH Arranged:    HH Agency:     Status of Service:     Medicare Important Message Given:    Date Medicare IM Given:    Medicare IM give by:    Date Additional Medicare IM Given:    Additional Medicare Important Message give by:     If discussed at [place] of Stay Meetings, dates discussed:    Additional Comments:  Avaleigh Decuir A, RN 03/21/2015, 4:20 PM

## 2015-03-21 NOTE — Discharge Instructions (Signed)
Acute Respiratory Failure °Respiratory failure is when your lungs are not working well and your breathing (respiratory) system fails. When respiratory failure occurs, it is difficult for your lungs to get enough oxygen, get rid of carbon dioxide, or both. Respiratory failure can be life threatening.  °Respiratory failure can be acute or chronic. Acute respiratory failure is sudden, severe, and requires emergency medical treatment. Chronic respiratory failure is less severe, happens over time, and requires ongoing treatment.  °WHAT ARE THE CAUSES OF ACUTE RESPIRATORY FAILURE?  °Any problem affecting the heart or lungs can cause acute respiratory failure. Some of these causes include the following: °· Chronic bronchitis and emphysema (COPD).   °· Blood clot going to a lung (pulmonary embolism).   °· Having water in the lungs caused by heart failure, lung injury, or infection (pulmonary edema).   °· Collapsed lung (pneumothorax).   °· Pneumonia.   °· Pulmonary fibrosis.   °· Obesity.   °· Asthma.   °· Heart failure.   °· Any type of trauma to the chest that can make breathing difficult.   °· Nerve or muscle diseases making chest movements difficult. °WHAT SYMPTOMS SHOULD YOU WATCH FOR?  °If you have any of these signs or symptoms, you should seek immediate medical care:  °· You have shortness of breath (dyspnea) with or without activity.   °· You have rapid, fast breathing (tachypnea).   °· You are wheezing. °· You are unable to say more than a few words without having to catch your breath. °· You find it very difficult to function normally. °· You have a fast heart rate.   °· You have a bluish color to your finger or toe nail beds.   °· You have confusion or drowsiness or both.   °HOW WILL MY ACUTE RESPIRATORY FAILURE BE TREATED?  °Treatment of acute respiratory failure depends on the cause of the respiratory failure. Usually, you will stay in the intensive care unit so your breathing can be watched closely. Treatment  can include the following: °· Oxygen. Oxygen can be delivered through the following: °¨ Nasal cannula. This is small tubing that goes in your nose to give you oxygen. °¨ Face mask. A face mask covers your nose and mouth to give you oxygen. °· Medicine. Different medicines can be given to help with breathing. These can include: °¨ Nebulizers. Nebulizers deliver medicines to open the air passages (bronchodilators). These medicines help to open or relax the airways in the lungs so you can breathe better. They can also help loosen mucus from your lungs. °¨ Diuretics. Diuretic medicines can help you breathe better by getting rid of extra water in your body. °¨ Steroids. Steroid medicines can help decrease swelling (inflammation) in your lungs. °¨ Antibiotics. °· Chest tube. If you have a collapsed lung (pneumothorax), a chest tube is placed to help reinflate the lung. °· Non-invasive positive pressure ventilation (NPPV). This is a tight-fitting mask that goes over your nose and mouth. The mask has tubing that is attached to a machine. The machine blows air into the tubing, which helps to keep the tiny air sacs (alveoli) in your lungs open. This machine allows you to breathe on your own. °· Ventilator. A ventilator is a breathing machine. When on a ventilator, a breathing tube is put into the lungs. A ventilator is used when you can no longer breathe well enough on your own. You may have low oxygen levels or high carbon dioxide (CO2) levels in your blood. When you are on a ventilator, sedation and pain medicines are given to make you sleep   so your lungs can heal. °Document Released: 09/02/2013 Document Revised: 01/12/2014 Document Reviewed: 09/02/2013 °ExitCare® Patient Information ©2015 [employer]. This information is not intended to replace advice given to you by your health care provider. Make sure you discuss any questions you have with your health care provider. ° °

## 2015-03-21 NOTE — Discharge Summary (Signed)
[place] [facility] [place] NAME: Collin [name]    MR#:  [id number]  DATE OF BIRTH:  1941/09/03  DATE OF ADMISSION:  03/19/2015 ADMITTING PHYSICIAN: Lance Coon, MD  DATE OF DISCHARGE: 03/21/2015  PRIMARY CARE PHYSICIAN: [name], MD    ADMISSION DIAGNOSIS:  Pulmonary edema cardiac cause [I50.1] Recurrent right pleural effusion [J90] Recurrent pleural effusion on right [M03] Chronic systolic CHF (congestive heart failure) [I50.22] Heel ulcer, left, limited to breakdown of skin [L97.421]  DISCHARGE DIAGNOSIS:  Principal Problem:   Recurrent right pleural effusion Active Problems:   COPD (chronic obstructive pulmonary disease)   HLD (hyperlipidemia)   HTN (hypertension)   GERD (gastroesophageal reflux disease)   Acute on chronic systolic CHF (congestive heart failure)   Open wound of left heel   SECONDARY DIAGNOSIS:   Past Medical History  Diagnosis Date  . COPD (chronic obstructive pulmonary disease)   . Chronic systolic CHF (congestive heart failure)   . Kidney stone   . Macular degeneration   . HLD (hyperlipidemia)   . Hypertensive retinopathy   . HTN (hypertension)   . GERD (gastroesophageal reflux disease)     HOSPITAL COURSE:   1. Acute respiratory failure, now chronic with hypoxia. Also 83% on room air. Patient qualifies for oxygen at home. I will have care manager set up oxygen at home and home health PT and RN since the patient is a new oxygen requirement patient. Patient has both COPD and congestive heart failure and recurrent pleural effusion. 2. Recurrent right pleural effusion. In analyzing the pleural fluid, he had a negative cytology last time. This is a transudate. It seems rare that CHF would cause a right-sided pleural effusion only. Since this is the weekend I was unable to get cardiothoracic surgery to see the patient today. I will refer as outpatient to Dr. Genevive Bi, for consideration of pleurodesis.  I explained to the patient if he starts feeling the shortness of breath, he must call Dr. Netty Starring to set up a thoracentesis. 3. Acute on chronic systolic congestive heart failure. I increased his Lasix to 40 mg twice a day, continue metoprolol. Patient has an allergy to ace inhibitors.  4. COPD continue inhalers. 5. Poor dentition- patient asking for a prescription for clindamycin prior to getting to the dentist. 6. Chronic pain, peripheral vascular disease with ulceration left lower extremity- prescribed oxycodone. 7. Anxiety- prescribed Valium like he takes at home.   DISCHARGE CONDITIONS:   Satisfactory  CONSULTS OBTAINED:  Treatment Team:  Teodoro Spray, MD  DRUG ALLERGIES:   Allergies  Allergen Reactions  . Lisinopril Swelling    Pt reports throat swelling.    DISCHARGE MEDICATIONS:   Current Discharge Medication List    START taking these medications   Details  clindamycin (CLEOCIN) 300 MG capsule Take 1 capsule (300 mg total) by mouth 3 (three) times daily. Qty: 30 capsule, Refills: 0      CONTINUE these medications which have CHANGED   Details  diazepam (VALIUM) 5 MG tablet Take 1 tablet (5 mg total) by mouth every 12 (twelve) hours as needed for muscle spasms. Qty: 30 tablet, Refills: 0    furosemide (LASIX) 20 MG tablet Take 2 tablets (40 mg total) by mouth 2 (two) times daily. Qty: 60 tablet, Refills: 0    oxyCODONE (OXY IR/ROXICODONE) 5 MG immediate release tablet Take 1 tablet (5 mg total) by mouth every 6 (six) hours as needed for severe pain. Qty: 40 tablet,  Refills: 0      CONTINUE these medications which have NOT CHANGED   Details  albuterol (PROVENTIL HFA;VENTOLIN HFA) 108 (90 BASE) MCG/ACT inhaler Inhale into the lungs every 6 (six) hours as needed for wheezing or shortness of breath.    aspirin 81 MG tablet Take 81 mg by mouth daily.    Fluticasone-Salmeterol (ADVAIR DISKUS) 100-50 MCG/DOSE AEPB Inhale 1 puff into the lungs 2 (two) times  daily. Qty: 60 each, Refills: 0    metoprolol succinate (TOPROL XL) 25 MG 24 hr tablet Take 1 tablet (25 mg total) by mouth daily. Qty: 30 tablet, Refills: 0    omeprazole (PRILOSEC) 20 MG capsule Take 20 mg by mouth 2 (two) times daily before a meal.    tiotropium (SPIRIVA HANDIHALER) 18 MCG inhalation capsule Place 1 capsule (18 mcg total) into inhaler and inhale daily. Qty: 30 capsule, Refills: 0         DISCHARGE INSTRUCTIONS:   Follow-up Dr. Netty Starring one week. Follow-up with Dr. Genevive Bi cardiothoracic surgery 1 week.  If you experience worsening of your admission symptoms, develop shortness of breath, life threatening emergency, suicidal or homicidal thoughts you must seek medical attention immediately by calling 911 or calling your MD immediately  if symptoms less severe.  You Must read complete instructions/literature along with all the possible adverse reactions/side effects for all the Medicines you take and that have been prescribed to you. Take any new Medicines after you have completely understood and accept all the possible adverse reactions/side effects.   Please note  You were cared for by a hospitalist during your hospital stay. If you have any questions about your discharge medications or the care you received while you were in the hospital after you are discharged, you can call the unit and asked to speak with the hospitalist on call if the hospitalist that took care of you is not available. Once you are discharged, your primary care physician will handle any further medical issues. Please note that NO REFILLS for any discharge medications will be authorized once you are discharged, as it is imperative that you return to your primary care physician (or establish a relationship with a primary care physician if you do not have one) for your aftercare needs so that they can reassess your need for medications and monitor your lab values.    Today   CHIEF COMPLAINT:    Chief Complaint  Patient presents with  . Shortness of Breath    HISTORY OF PRESENT ILLNESS:  Collin [name]  is a 74 y.o. male with a known history of systolic congestive heart failure, COPD and recurrent right pleural effusion. He presented with shortness of breath and found to have her recurrent right pleural effusion.   VITAL SIGNS:  Blood pressure 142/78, pulse 113, temperature 97.7 F (36.5 C), temperature source Oral, resp. rate 18, height 6\' 2"  (1.88 m), weight 76.839 kg (169 lb 6.4 oz), SpO2 91 %.  Pulse ox on room air 83%.  I/O:   Intake/Output Summary (Last 24 hours) at 03/21/15 0947 Last data filed at 03/21/15 0946  Gross per 24 hour  Intake      0 ml  Output   2125 ml  Net  -2125 ml    PHYSICAL EXAMINATION:  GENERAL:  74 y.o.-year-old patient lying in the bed with no acute distress.  EYES: Pupils equal, round, reactive to light and accommodation. No scleral icterus. Extraocular muscles intact.  HEENT: Head atraumatic, normocephalic. Oropharynx and nasopharynx clear.  NECK:  Supple, no jugular venous distention. No thyroid enlargement, no tenderness.  LUNGS:  decreased breath sounds right base, better air entry right middle lung and upper lung.  slight upper airway wheezing. No rales,rhonchi or crepitation. No use of accessory muscles of respiration.  CARDIOVASCULAR: S1, S2 normal. No murmurs, rubs, or gallops.  ABDOMEN: Soft, non-tender, non-distended. Bowel sounds present. No organomegaly or mass.  EXTREMITIES:  trace edema,  no cyanosis, or clubbing.  NEUROLOGIC: Cranial nerves II through XII are intact. Muscle strength 5/5 in all extremities. Sensation intact. Gait not checked.  PSYCHIATRIC: The patient is alert and oriented x 3.  SKIN: No obvious rash, lesion, or ulcer.   DATA REVIEW:   CBC  Recent Labs Lab 03/20/15 0414  WBC 8.1  HGB 12.6*  HCT 40.3  PLT 255    Chemistries   Recent Labs Lab 03/21/15 0414  NA 136  K 4.0  CL 104  CO2 25   GLUCOSE 107*  BUN 31*  CREATININE 1.68*  CALCIUM 8.7*    Cardiac Enzymes  Recent Labs Lab 03/19/15 1729  TROPONINI 0.03    RADIOLOGY:  Dg Chest 2 View  03/20/2015   CLINICAL DATA:  Right thoracentesis  EXAM: CHEST  2 VIEW  COMPARISON:  Yesterday  FINDINGS: The right pleural effusion has markedly improved and there is no pneumothorax after right thoracentesis. Heart remains enlarged. Exam is otherwise stable.  IMPRESSION: Right pleural effusion has improved and there is no pneumothorax after right thoracentesis.   Electronically Signed   By: Marybelle Killings M.D.   On: 03/20/2015 14:40   Dg Chest 2 View  03/19/2015   CLINICAL DATA:  Shortness of breath for 3 days. Congestive heart failure. Right pleural effusion.  EXAM: CHEST  2 VIEW  COMPARISON:  03/06/2015  FINDINGS: Cardiomegaly is stable. Increased interstitial infiltrates and Kerley B-lines are seen, consistent with increased interstitial edema. Moderate size right pleural effusion and right upper and lower lung atelectasis is increased since previous study. Previously seen small right pleural effusion has nearly completely resolved.  IMPRESSION: Increased diffuse pulmonary edema, right pleural effusion and right lung atelectasis, consistent with congestive heart failure.  Stable cardiomegaly. Near complete resolution of left pleural effusion.   Electronically Signed   By: Earle Gell M.D.   On: 03/19/2015 19:49   US Thoracentesis Asp Pleural Space W/img Guide  03/20/2015   CLINICAL DATA:  Right pleural effusion  EXAM: ULTRASOUND GUIDED RIGHT THORACENTESIS  COMPARISON:  None.  PROCEDURE: An ultrasound guided thoracentesis was thoroughly discussed with the patient and questions answered. The benefits, risks, alternatives and complications were also discussed. The patient understands and wishes to proceed with the procedure. Written consent was obtained.  Ultrasound was performed to localize and mark an adequate pocket of fluid in the right chest.  The area was then prepped and draped in the normal sterile fashion. 1% Lidocaine was used for local anesthesia. Under ultrasound guidance a Safe-T-Centesis catheter was introduced. Thoracentesis was performed. The catheter was removed and a dressing applied.  COMPLICATIONS: None.  FINDINGS: A total of approximately 2.4 L of clear yellow fluid was removed. A fluid sample was not sent for laboratory analysis.  IMPRESSION: Successful ultrasound guided right thoracentesis yielding 2.4 L of pleural fluid.   Electronically Signed   By: Marybelle Killings M.D.   On: 03/20/2015 12:37    Management plans discussed with the patient, family and he is  in agreement.  CODE STATUS:     Code Status  Orders        Start     Ordered   03/20/15 0021  Full code   Continuous     03/20/15 0020      TOTAL TIME TAKING CARE OF THIS PATIENT: 40  minutes.    Loletha Grayer M.D on 03/21/2015 at 9:47 AM  Between 7am to 6pm - Pager - [phone]  After 6pm go to [email or web address] - password [email or web address]  Office  [phone]  CC: Primary care physician; [name], MD

## 2015-03-21 NOTE — Consult Note (Signed)
WOC wound consult note Reason for Consult:Left heel wound Patient reports that this is a ruptured blister that he has been caring for at home for 2 weeks with his wife's assistance using triple antibiotic ointment.  He notes that it is painful. In the recent past he states that he has had LE swelling and a blister formed.  The presentation is consistent with a friction injury to friable tissue. The right heel is with normal findings. Wound type: traumatic Pressure Ulcer POA: No Measurement:0.8cm x 1.5cm x 0.2cm Wound CZY:SAYT, dry, friable Drainage (amount, consistency, odor) none at this time Periwound:erythematous, blanchable, very dry Dressing procedure/placement/frequency: I have implemented a conservative POC for this area using saline moistened (continually moist) dressings topped with a silicone foam dressing designed specifically for the heel to provide padding and protection.  Additionally, while patient is in bed, I have provided a pressure redistribution heel boot that corrects for lateral rotation with a wedge. [facility] nursing team will not follow, but will remain available to this patient, the nursing and medical teams.  Please re-consult if needed. Thanks, Maudie Flakes, MSN, RN, [place], [place], [place] [phone])

## 2015-03-21 NOTE — Care Management Note (Signed)
Case Management Note  Patient Details  Name: MARYLAND LUPPINO MRN: [id number] Date of Birth: 1941-02-14  Subjective/Objective:       A request for home oxygen and a portable tank to be delivered to him in his hospital room, and RN, PT, faxed and called to [facility]. Called Mr Angert in his hospital room to explain discharge orders by physician, and that delivery of portable tank may take several hours on weekends. Mr Perra verbalized understanding.              Action/Plan:   Expected Discharge Date:  03/22/15               Expected Discharge Plan:     In-House Referral:     Discharge planning Services     Post Acute Care Choice:    Choice offered to:     DME Arranged:    DME Agency:     HH Arranged:    HH Agency:     Status of Service:     Medicare Important Message Given:    Date Medicare IM Given:    Medicare IM give by:    Date Additional Medicare IM Given:    Additional Medicare Important Message give by:     If discussed at [place] of Stay Meetings, dates discussed:    Additional Comments:  Luiza Carranco A, RN 03/21/2015, 10:29 AM

## 2015-03-23 DIAGNOSIS — Z720 Tobacco use: Secondary | ICD-10-CM | POA: Diagnosis not present

## 2015-03-23 DIAGNOSIS — J449 Chronic obstructive pulmonary disease, unspecified: Secondary | ICD-10-CM | POA: Diagnosis not present

## 2015-03-23 DIAGNOSIS — I5023 Acute on chronic systolic (congestive) heart failure: Secondary | ICD-10-CM | POA: Diagnosis not present

## 2015-03-23 DIAGNOSIS — K219 Gastro-esophageal reflux disease without esophagitis: Secondary | ICD-10-CM | POA: Diagnosis not present

## 2015-03-23 DIAGNOSIS — I10 Essential (primary) hypertension: Secondary | ICD-10-CM | POA: Diagnosis not present

## 2015-03-23 DIAGNOSIS — E785 Hyperlipidemia, unspecified: Secondary | ICD-10-CM | POA: Diagnosis not present

## 2015-03-23 DIAGNOSIS — S91302D Unspecified open wound, left foot, subsequent encounter: Secondary | ICD-10-CM | POA: Diagnosis not present

## 2015-03-25 DIAGNOSIS — J449 Chronic obstructive pulmonary disease, unspecified: Secondary | ICD-10-CM | POA: Diagnosis not present

## 2015-03-25 DIAGNOSIS — I5023 Acute on chronic systolic (congestive) heart failure: Secondary | ICD-10-CM | POA: Diagnosis not present

## 2015-03-25 DIAGNOSIS — E785 Hyperlipidemia, unspecified: Secondary | ICD-10-CM | POA: Diagnosis not present

## 2015-03-25 DIAGNOSIS — K219 Gastro-esophageal reflux disease without esophagitis: Secondary | ICD-10-CM | POA: Diagnosis not present

## 2015-03-25 DIAGNOSIS — S91302D Unspecified open wound, left foot, subsequent encounter: Secondary | ICD-10-CM | POA: Diagnosis not present

## 2015-03-25 DIAGNOSIS — I10 Essential (primary) hypertension: Secondary | ICD-10-CM | POA: Diagnosis not present

## 2015-03-31 DIAGNOSIS — K219 Gastro-esophageal reflux disease without esophagitis: Secondary | ICD-10-CM | POA: Diagnosis not present

## 2015-03-31 DIAGNOSIS — S91302D Unspecified open wound, left foot, subsequent encounter: Secondary | ICD-10-CM | POA: Diagnosis not present

## 2015-03-31 DIAGNOSIS — J449 Chronic obstructive pulmonary disease, unspecified: Secondary | ICD-10-CM | POA: Diagnosis not present

## 2015-03-31 DIAGNOSIS — E785 Hyperlipidemia, unspecified: Secondary | ICD-10-CM | POA: Diagnosis not present

## 2015-03-31 DIAGNOSIS — I5023 Acute on chronic systolic (congestive) heart failure: Secondary | ICD-10-CM | POA: Diagnosis not present

## 2015-03-31 DIAGNOSIS — I10 Essential (primary) hypertension: Secondary | ICD-10-CM | POA: Diagnosis not present

## 2015-04-08 DIAGNOSIS — S91302D Unspecified open wound, left foot, subsequent encounter: Secondary | ICD-10-CM | POA: Diagnosis not present

## 2015-04-08 DIAGNOSIS — I10 Essential (primary) hypertension: Secondary | ICD-10-CM | POA: Diagnosis not present

## 2015-04-08 DIAGNOSIS — J449 Chronic obstructive pulmonary disease, unspecified: Secondary | ICD-10-CM | POA: Diagnosis not present

## 2015-04-08 DIAGNOSIS — K219 Gastro-esophageal reflux disease without esophagitis: Secondary | ICD-10-CM | POA: Diagnosis not present

## 2015-04-08 DIAGNOSIS — I5023 Acute on chronic systolic (congestive) heart failure: Secondary | ICD-10-CM | POA: Diagnosis not present

## 2015-04-08 DIAGNOSIS — E785 Hyperlipidemia, unspecified: Secondary | ICD-10-CM | POA: Diagnosis not present

## 2015-04-20 DIAGNOSIS — S91302D Unspecified open wound, left foot, subsequent encounter: Secondary | ICD-10-CM | POA: Diagnosis not present

## 2015-04-20 DIAGNOSIS — I10 Essential (primary) hypertension: Secondary | ICD-10-CM | POA: Diagnosis not present

## 2015-04-20 DIAGNOSIS — J449 Chronic obstructive pulmonary disease, unspecified: Secondary | ICD-10-CM | POA: Diagnosis not present

## 2015-04-20 DIAGNOSIS — K219 Gastro-esophageal reflux disease without esophagitis: Secondary | ICD-10-CM | POA: Diagnosis not present

## 2015-04-20 DIAGNOSIS — E785 Hyperlipidemia, unspecified: Secondary | ICD-10-CM | POA: Diagnosis not present

## 2015-04-20 DIAGNOSIS — I5023 Acute on chronic systolic (congestive) heart failure: Secondary | ICD-10-CM | POA: Diagnosis not present

## 2015-05-07 DIAGNOSIS — J449 Chronic obstructive pulmonary disease, unspecified: Secondary | ICD-10-CM | POA: Diagnosis not present

## 2015-05-07 DIAGNOSIS — S91302D Unspecified open wound, left foot, subsequent encounter: Secondary | ICD-10-CM | POA: Diagnosis not present

## 2015-05-07 DIAGNOSIS — E785 Hyperlipidemia, unspecified: Secondary | ICD-10-CM | POA: Diagnosis not present

## 2015-05-07 DIAGNOSIS — K219 Gastro-esophageal reflux disease without esophagitis: Secondary | ICD-10-CM | POA: Diagnosis not present

## 2015-05-07 DIAGNOSIS — I10 Essential (primary) hypertension: Secondary | ICD-10-CM | POA: Diagnosis not present

## 2015-05-07 DIAGNOSIS — I5023 Acute on chronic systolic (congestive) heart failure: Secondary | ICD-10-CM | POA: Diagnosis not present

## 2015-05-20 DIAGNOSIS — I5023 Acute on chronic systolic (congestive) heart failure: Secondary | ICD-10-CM | POA: Diagnosis not present

## 2015-05-20 DIAGNOSIS — E785 Hyperlipidemia, unspecified: Secondary | ICD-10-CM | POA: Diagnosis not present

## 2015-05-20 DIAGNOSIS — K219 Gastro-esophageal reflux disease without esophagitis: Secondary | ICD-10-CM | POA: Diagnosis not present

## 2015-05-20 DIAGNOSIS — J449 Chronic obstructive pulmonary disease, unspecified: Secondary | ICD-10-CM | POA: Diagnosis not present

## 2015-05-20 DIAGNOSIS — I10 Essential (primary) hypertension: Secondary | ICD-10-CM | POA: Diagnosis not present

## 2015-05-20 DIAGNOSIS — S91302D Unspecified open wound, left foot, subsequent encounter: Secondary | ICD-10-CM | POA: Diagnosis not present

## 2015-06-02 DIAGNOSIS — I10 Essential (primary) hypertension: Secondary | ICD-10-CM | POA: Diagnosis not present

## 2015-06-02 DIAGNOSIS — N390 Urinary tract infection, site not specified: Secondary | ICD-10-CM | POA: Diagnosis not present

## 2015-06-02 DIAGNOSIS — N183 Chronic kidney disease, stage 3 (moderate): Secondary | ICD-10-CM | POA: Diagnosis not present

## 2015-06-02 DIAGNOSIS — N2581 Secondary hyperparathyroidism of renal origin: Secondary | ICD-10-CM | POA: Diagnosis not present

## 2015-06-06 ENCOUNTER — Emergency Department: Payer: Medicaid Other

## 2015-06-06 ENCOUNTER — Encounter: Payer: Self-pay | Admitting: Emergency Medicine

## 2015-06-06 ENCOUNTER — Emergency Department
Admission: EM | Admit: 2015-06-06 | Discharge: 2015-06-06 | Disposition: A | Discharge status: Home / Self Care | Payer: Medicaid Other | Attending: Emergency Medicine | Admitting: Emergency Medicine

## 2015-06-06 DIAGNOSIS — H35039 Hypertensive retinopathy, unspecified eye: Secondary | ICD-10-CM | POA: Insufficient documentation

## 2015-06-06 DIAGNOSIS — N179 Acute kidney failure, unspecified: Secondary | ICD-10-CM | POA: Diagnosis not present

## 2015-06-06 DIAGNOSIS — Z720 Tobacco use: Secondary | ICD-10-CM | POA: Insufficient documentation

## 2015-06-06 DIAGNOSIS — R140 Abdominal distension (gaseous): Secondary | ICD-10-CM | POA: Diagnosis not present

## 2015-06-06 DIAGNOSIS — N3090 Cystitis, unspecified without hematuria: Secondary | ICD-10-CM

## 2015-06-06 DIAGNOSIS — Z7982 Long term (current) use of aspirin: Secondary | ICD-10-CM | POA: Diagnosis not present

## 2015-06-06 DIAGNOSIS — R1084 Generalized abdominal pain: Secondary | ICD-10-CM

## 2015-06-06 DIAGNOSIS — Z7951 Long term (current) use of inhaled steroids: Secondary | ICD-10-CM | POA: Insufficient documentation

## 2015-06-06 DIAGNOSIS — J441 Chronic obstructive pulmonary disease with (acute) exacerbation: Secondary | ICD-10-CM | POA: Diagnosis not present

## 2015-06-06 DIAGNOSIS — R0602 Shortness of breath: Secondary | ICD-10-CM | POA: Diagnosis not present

## 2015-06-06 DIAGNOSIS — N132 Hydronephrosis with renal and ureteral calculous obstruction: Secondary | ICD-10-CM | POA: Diagnosis not present

## 2015-06-06 DIAGNOSIS — Z79899 Other long term (current) drug therapy: Secondary | ICD-10-CM | POA: Diagnosis not present

## 2015-06-06 DIAGNOSIS — I129 Hypertensive chronic kidney disease with stage 1 through stage 4 chronic kidney disease, or unspecified chronic kidney disease: Secondary | ICD-10-CM | POA: Diagnosis not present

## 2015-06-06 DIAGNOSIS — N189 Chronic kidney disease, unspecified: Secondary | ICD-10-CM | POA: Diagnosis not present

## 2015-06-06 DIAGNOSIS — J90 Pleural effusion, not elsewhere classified: Secondary | ICD-10-CM

## 2015-06-06 DIAGNOSIS — R000 Tachycardia, unspecified: Secondary | ICD-10-CM | POA: Insufficient documentation

## 2015-06-06 DIAGNOSIS — Z792 Long term (current) use of antibiotics: Secondary | ICD-10-CM | POA: Diagnosis not present

## 2015-06-06 DIAGNOSIS — N289 Disorder of kidney and ureter, unspecified: Secondary | ICD-10-CM

## 2015-06-06 LAB — COMPREHENSIVE METABOLIC PANEL
ALT: 14 U/L — ABNORMAL LOW (ref 17–63)
AST: 22 U/L (ref 15–41)
Albumin: 4 g/dL (ref 3.5–5.0)
Alkaline Phosphatase: 118 U/L (ref 38–126)
Anion gap: 11 (ref 5–15)
BUN: 36 mg/dL — ABNORMAL HIGH (ref 6–20)
CHLORIDE: 105 mmol/L (ref 101–111)
CO2: 21 mmol/L — ABNORMAL LOW (ref 22–32)
CREATININE: 2.78 mg/dL — AB (ref 0.61–1.24)
Calcium: 9.3 mg/dL (ref 8.9–10.3)
GFR calc non Af Amer: 21 mL/min — ABNORMAL LOW (ref >60)
GFR, EST AFRICAN AMERICAN: 25 mL/min — AB (ref >60)
GLUCOSE: 124 mg/dL — AB (ref 65–99)
Potassium: 4.6 mmol/L (ref 3.5–5.1)
SODIUM: 137 mmol/L (ref 135–145)
Total Bilirubin: 1.4 mg/dL — ABNORMAL HIGH (ref 0.3–1.2)
Total Protein: 7.1 g/dL (ref 6.5–8.1)

## 2015-06-06 LAB — CBC WITH DIFFERENTIAL/PLATELET
Basophils Absolute: 0.1 10*3/uL (ref 0–0.1)
Basophils Relative: 0 %
EOS PCT: 0 %
Eosinophils Absolute: 0 10*3/uL (ref 0–0.7)
HCT: 45.8 % (ref 40.0–52.0)
HEMOGLOBIN: 14.4 g/dL (ref 13.0–18.0)
LYMPHS ABS: 0.7 10*3/uL — AB (ref 1.0–3.6)
LYMPHS PCT: 4 %
MCH: 24.1 pg — AB (ref 26.0–34.0)
MCHC: 31.5 g/dL — ABNORMAL LOW (ref 32.0–36.0)
MCV: 76.5 fL — AB (ref 80.0–100.0)
MONOS PCT: 7 %
Monocytes Absolute: 1.2 10*3/uL — ABNORMAL HIGH (ref 0.2–1.0)
NEUTROS PCT: 89 %
Neutro Abs: 15.3 10*3/uL — ABNORMAL HIGH (ref 1.4–6.5)
Platelets: 197 10*3/uL (ref 150–440)
RBC: 5.99 MIL/uL — AB (ref 4.40–5.90)
RDW: 20.4 % — ABNORMAL HIGH (ref 11.5–14.5)
WBC: 17.3 10*3/uL — AB (ref 3.8–10.6)

## 2015-06-06 LAB — URINALYSIS COMPLETE WITH MICROSCOPIC (ARMC ONLY)
BACTERIA UA: NONE SEEN
Bilirubin Urine: NEGATIVE
GLUCOSE, UA: NEGATIVE mg/dL
Ketones, ur: NEGATIVE mg/dL
Nitrite: NEGATIVE
PROTEIN: 100 mg/dL — AB
SPECIFIC GRAVITY, URINE: 1.016 (ref 1.005–1.030)
pH: 5 (ref 5.0–8.0)

## 2015-06-06 LAB — TROPONIN I: Troponin I: 0.03 ng/mL (ref <0.031)

## 2015-06-06 MED ORDER — HYDROMORPHONE HCL 1 MG/ML IJ SOLN
INTRAMUSCULAR | Status: AC
  Administered 2015-06-06: 1 mg via INTRAVENOUS
  Filled 2015-06-06: qty 1

## 2015-06-06 MED ORDER — SULFAMETHOXAZOLE-TRIMETHOPRIM 800-160 MG PO TABS: 1.0000 | ORAL_TABLET | Freq: Two times a day (BID) | ORAL | Status: DC

## 2015-06-06 MED ORDER — DEXTROSE 5 % IV SOLN
1.0000 g | Freq: Once | INTRAVENOUS | Status: AC
  Administered 2015-06-06: 1 g via INTRAVENOUS
  Filled 2015-06-06: qty 10

## 2015-06-06 MED ORDER — OXYCODONE-ACETAMINOPHEN 5-325 MG PO TABS: 1.0000 | ORAL_TABLET | Freq: Four times a day (QID) | ORAL | Status: DC | PRN

## 2015-06-06 MED ORDER — HYDROMORPHONE HCL 1 MG/ML IJ SOLN
1.0000 mg | Freq: Once | INTRAMUSCULAR | Status: AC
  Administered 2015-06-06: 1 mg via INTRAVENOUS

## 2015-06-06 NOTE — Discharge Instructions (Signed)
You were prescribed a medication that is potentially sedating. Do not drink alcohol, drive or participate in any other potentially dangerous activities while taking this medication as it may make you sleepy. Do not take this medication with any other sedating medications, either prescription or over-the-counter. If you were prescribed Percocet or Vicodin, do not take these with acetaminophen (Tylenol) as it is already contained within these medications.   Opioid pain medications (or "narcotics") can be habit forming.  Use it as little as possible to achieve adequate pain control.  Do not use or use it with extreme caution if you have a history of opiate abuse or dependence.  If you are on a pain contract with your primary care doctor or a pain specialist, be sure to let them know you were prescribed this medication today from the [facility].  This medication is intended for your use only - do not give any to anyone else and keep it in a secure place where nobody else, especially children and pets, have access to it.  It will also cause or worsen constipation, so you may want to consider taking an over-the-counter stool softener while you are taking this medication.  Your chest x-ray and exam show that your pleural effusion has come back. It is not affecting your lung function at this time, so you should follow up with your primary care doctor who can determine if you should have it drained again. Also follow-up regarding your kidney function which has decreased compared to your usual kidney function.   Acute Kidney Injury Acute kidney injury is a disease in which there is sudden (acute) damage to the kidneys. The kidneys are 2 organs that lie on either side of the spine between the middle of the back and the front of the abdomen. The kidneys:  Remove wastes and extra water from the blood.   Produce important hormones. These help keep bones strong, regulate blood pressure, and  help create red blood cells.   Balance the fluids and chemicals in the blood and tissues. A small amount of kidney damage may not cause problems, but a large amount of damage may make it difficult or impossible for the kidneys to work the way they should. Acute kidney injury may develop into long-lasting (chronic) kidney disease. It may also develop into a life-threatening disease called end-stage kidney disease. Acute kidney injury can get worse very quickly, so it should be treated right away. Early treatment may prevent other kidney diseases from developing.  CAUSES   A problem with blood flow to the kidneys. This may be caused by:   Blood loss.   Heart disease.   Severe burns.   Liver disease.  Direct damage to the kidneys. This may be caused by:  Some medicines.   A kidney infection.   Poisoning or consuming toxic substances.   A surgical wound.   A blow to the kidney area.   A problem with urine flow. This may be caused by:   Cancer.   Kidney stones.   An enlarged prostate. SYMPTOMS   Swelling (edema) of the legs, ankles, or feet.   Tiredness (lethargy).   Nausea or vomiting.   Confusion.   Problems with urination, such as:   Painful or burning feeling during urination.   Decreased urine production.   Frequent accidents in children who are potty trained.   Bloody urine.   Muscle twitches and cramps.   Shortness of breath.   Seizures.  Chest pain or pressure. Sometimes, no symptoms are present. DIAGNOSIS Acute kidney injury may be detected and diagnosed by tests, including blood, urine, imaging, or kidney biopsy tests.  TREATMENT Treatment of acute kidney injury varies depending on the cause and severity of the kidney damage. In mild cases, no treatment may be needed. The kidneys may heal on their own. If acute kidney injury is more severe, your caregiver will treat the cause of the kidney damage, help the kidneys  heal, and prevent complications from occurring. Severe cases may require a procedure to remove toxic wastes from the body (dialysis) or surgery to repair kidney damage. Surgery may involve:   Repair of a torn kidney.   Removal of an obstruction. Most of the time, you will need to stay overnight at the hospital.  HOME CARE INSTRUCTIONS:  Follow your prescribed diet.  Only take over-the-counter or prescription medicines as directed by your caregiver.  Do not take any new medicines (prescription, over-the-counter, or nutritional supplements) unless approved by your caregiver. Many medicines can worsen your kidney damage or need to have the dose adjusted.   Keep all follow-up appointments as directed by your caregiver.  Observe your condition to make sure you are healing as expected. SEEK IMMEDIATE MEDICAL CARE IF:  You are feeling ill or have severe pain in the back or side.   Your symptoms return or you have new symptoms.  You have any symptoms of end-stage kidney disease. These include:   Persistent itchiness.   Loss of appetite.   Headaches.   Abnormally dark or light skin.  Numbness in the hands or feet.   Easy bruising.   Frequent hiccups.   Menstruation stops.   You have a fever.  You have increased urine production.  You have pain or bleeding when urinating. MAKE SURE YOU:   Understand these instructions.  Will watch your condition.  Will get help right away if you are not doing well or get worse Document Released: 03/13/2011 Document Revised: 12/23/2012 Document Reviewed: 04/26/2012 [facility] Patient Information 2015 [place], [place]. This information is not intended to replace advice given to you by your health care provider. Make sure you discuss any questions you have with your health care provider.  Pleural Effusion The lining covering your lungs and the inside of your chest is called the pleura. Usually, the space between the two pleura  contains no air and only a thin layer of fluid. A pleural effusion is an abnormal buildup of fluid in the pleural space. Fluid gathers when there is increased pressure in the lung vessels. This forces fluids out of the lungs and into the pleural space. Vessels may also leak fluids when there are infections, such as pneumonia, or other causes of soreness and redness (inflammation). Fluids leak into the lungs when protein in the blood is low or when certain vessels (lymphatics) are blocked. Finding a pleural effusion is important because it is usually caused by another disease. In order to treat a pleural effusion, your health care provider needs to find its cause. If left untreated, a large amount of fluid can build up and cause collapse of the lung. CAUSES   Heart failure.  Infections (pneumonia, tuberculosis), pulmonary embolism, pulmonary infarction.  Cancer (primary lung and metastatic), asbestosis.  Liver failure (cirrhosis).  Nephrotic syndrome, peritoneal dialysis, kidney problems (uremia).  Collagen vascular disease (systemic lupus erythematosus, rheumatoid arthritis).  Injury (trauma) to the chest or rupture of the digestive tube (esophagus).  Material in the chest or  pleural space (hemothorax, chylothorax).  Pancreatitis.  Surgery.  Drug reactions. SYMPTOMS  A pleural effusion can decrease the amount of space available for breathing and make you short of breath. The fluid can become infected, which may cause pain and fever. Often, the pain is worse when taking a deep breath. The underlying disease (heart failure, pneumonia, blood clot, tuberculosis, cancer) may also cause symptoms. DIAGNOSIS   Your health care provider can usually tell what is wrong by talking to you (taking a history), doing an exam, and taking a routine X-ray. If the X-ray shows fluid in your chest, often fluid is removed from your chest with a needle for testing (diagnostic thoracentesis).  Sometimes, more  specialized X-rays may be needed.  Sometimes, a small piece of tissue is removed and examined by a specialist (biopsy). TREATMENT  Treatment varies based on what caused the pleural effusion. Treatments include:  Removing as much fluid as possible using a needle (thoracentesis) to improve the cough and shortness of breath. This is a simple procedure that can be done at bedside. The risks are bleeding, infection, collapse of a lung, or low blood pressure.  Placing a tube in the chest to drain the effusion (tube thoracostomy). This is often used when there is an infection in the fluid. This is a simple procedure that can often be done at bedside or in a clinic. The procedure may be painful. The risks are the same as using a needle to drain the fluid. The chest tube usually remains for a few days and is connected to suction to improve fluid drainage. After placement, the tube usually does not cause much discomfort.  Surgical removal of fibrous debris in and around the pleural space (decortication). This may be done with a flexible telescope (thoracoscope) through a small or large cut (incision). This is helpful for patients who have fibrosis or scar tissue that prevents complete lung expansion. The risks are infection, blood loss, and side effects from general anesthesia.  Sometimes, a procedure called pleurodesis is done. A chest tube is placed and the fluid is drained. Next, an agent (tetracycline, talc powder) is added to the pleural space. This causes the lung and chest wall to stick together (adhesion). This leaves no potential space for fluid to build up. The risks include infection, blood loss, and side effects from general anesthesia.  If the effusion is caused by infection, it may be treated with antibiotics and may improve without draining. HOME CARE INSTRUCTIONS   Take any medicines exactly as prescribed.  Follow up with your health care provider as directed.  Monitor your exercise  capacity (the amount of walking you can do before you get short of breath).  Do not use any tobacco products including cigarettes, chewing tobacco, or electronic cigarettes. SEEK MEDICAL CARE IF:   Your exercise capacity seems to get worse or does not improve with time.  You do not recover from your illness.  You have drainage, redness, swelling, or pain at any incision or puncture sites. SEEK IMMEDIATE MEDICAL CARE IF:   Shortness of breath or chest pain develops or gets worse.  You have a fever.  You develop a new cough, especially if the mucus (phlegm) is discolored. MAKE SURE YOU:   Understand these instructions.  Will watch your condition.  Will get help right away if you are not doing well or get worse. Document Released: 08/28/2005 Document Revised: 01/12/2014 Document Reviewed: 04/19/2007 [facility] Patient Information 2015 [place], [place]. This information is not intended  to replace advice given to you by your health care provider. Make sure you discuss any questions you have with your health care provider.  Urinary Tract Infection Urinary tract infections (UTIs) can develop anywhere along your urinary tract. Your urinary tract is your body's drainage system for removing wastes and extra water. Your urinary tract includes two kidneys, two ureters, a bladder, and a urethra. Your kidneys are a pair of bean-shaped organs. Each kidney is about the size of your fist. They are located below your ribs, one on each side of your spine. CAUSES Infections are caused by microbes, which are microscopic organisms, including fungi, viruses, and bacteria. These organisms are so small that they can only be seen through a microscope. Bacteria are the microbes that most commonly cause UTIs. SYMPTOMS  Symptoms of UTIs may vary by age and gender of the patient and by the location of the infection. Symptoms in young women typically include a frequent and intense urge to urinate and a painful,  burning feeling in the bladder or urethra during urination. Older women and men are more likely to be tired, shaky, and weak and have muscle aches and abdominal pain. A fever may mean the infection is in your kidneys. Other symptoms of a kidney infection include pain in your back or sides below the ribs, nausea, and vomiting. DIAGNOSIS To diagnose a UTI, your caregiver will ask you about your symptoms. Your caregiver also will ask to provide a urine sample. The urine sample will be tested for bacteria and white blood cells. White blood cells are made by your body to help fight infection. TREATMENT  Typically, UTIs can be treated with medication. Because most UTIs are caused by a bacterial infection, they usually can be treated with the use of antibiotics. The choice of antibiotic and length of treatment depend on your symptoms and the type of bacteria causing your infection. HOME CARE INSTRUCTIONS  If you were prescribed antibiotics, take them exactly as your caregiver instructs you. Finish the medication even if you feel better after you have only taken some of the medication.  Drink enough water and fluids to keep your urine clear or pale yellow.  Avoid caffeine, tea, and carbonated beverages. They tend to irritate your bladder.  Empty your bladder often. Avoid holding urine for long periods of time.  Empty your bladder before and after sexual intercourse.  After a bowel movement, women should cleanse from front to back. Use each tissue only once. SEEK MEDICAL CARE IF:   You have back pain.  You develop a fever.  Your symptoms do not begin to resolve within 3 days. SEEK IMMEDIATE MEDICAL CARE IF:   You have severe back pain or lower abdominal pain.  You develop chills.  You have nausea or vomiting.  You have continued burning or discomfort with urination. MAKE SURE YOU:   Understand these instructions.  Will watch your condition.  Will get help right away if you are not  doing well or get worse. Document Released: 06/07/2005 Document Revised: 02/27/2012 Document Reviewed: 10/06/2011 [facility] Patient Information 2015 [place], [place]. This information is not intended to replace advice given to you by your health care provider. Make sure you discuss any questions you have with your health care provider.

## 2015-06-06 NOTE — ED Notes (Signed)
Pt presents to the ER from home with complaint of shortness of breath since yesterday. Pt reports he has COPD is on 2 L [place], reports using inhalers and not feeling better. Pt also reports he is unable to to empty his bladder since Friday. Pt takes breaks to catch his breath when talking. Pt denies any other medical complaint at present.

## 2015-06-06 NOTE — ED Notes (Signed)
Patient transported to X-ray 

## 2015-06-06 NOTE — ED Notes (Signed)
Bladder scan showed only 47 mls in bladder. Dr made aware

## 2015-06-06 NOTE — ED Provider Notes (Signed)
[facility] Emergency Department Provider Note  ____________________________________________  Time seen: 10:45 AM  I have reviewed the triage vital signs and the nursing notes.   HISTORY  Chief Complaint Shortness of Breath; Flank Pain; and Urinary Frequency    HPI Collin [name] is a 74 y.o. male who complains of lower abdominal pain with urinary frequency and dysuria. He is also having some increased shortness of breath in the setting of his usual COPD on home oxygen. His inhalers are not improving it. He has not had increased oxygen requirements. He previously had a thoracentesis to remove a right-sided pleural effusion. No fevers chills cough chest pain nausea vomiting diarrhea. He had a normal bowel movement this morning.  Has a history of kidney stones and a right ureteral stent placed by Dr. Noland Fordyce off at [facility]   Past Medical History  Diagnosis Date  . COPD (chronic obstructive pulmonary disease)   . Chronic systolic CHF (congestive heart failure)   . Kidney stone   . Macular degeneration   . HLD (hyperlipidemia)   . Hypertensive retinopathy   . HTN (hypertension)   . GERD (gastroesophageal reflux disease)      Patient Active Problem List   Diagnosis Date Noted  . Acute on chronic systolic CHF (congestive heart failure) 03/19/2015  . Open wound of left heel 03/19/2015  . Recurrent right pleural effusion 03/04/2015  . Acute respiratory failure with hypoxemia 03/04/2015  . COPD (chronic obstructive pulmonary disease) 03/04/2015  . HLD (hyperlipidemia) 03/04/2015  . Chronic systolic CHF (congestive heart failure) 03/04/2015  . HTN (hypertension) 03/04/2015  . GERD (gastroesophageal reflux disease) 03/04/2015     Past Surgical History  Procedure Laterality Date  . Abdominal aortic aneurysm repair    . Hernia repair    . Kidney stone surgery       Current Outpatient Rx  Name  Route  Sig  Dispense  Refill  . albuterol (PROVENTIL  HFA;VENTOLIN HFA) 108 (90 BASE) MCG/ACT inhaler   Inhalation   Inhale into the lungs every 6 (six) hours as needed for wheezing or shortness of breath.         Marland Kitchen aspirin 81 MG tablet   Oral   Take 81 mg by mouth daily.         . clindamycin (CLEOCIN) 300 MG capsule   Oral   Take 1 capsule (300 mg total) by mouth 3 (three) times daily.   30 capsule   0   . diazepam (VALIUM) 5 MG tablet   Oral   Take 1 tablet (5 mg total) by mouth every 12 (twelve) hours as needed for muscle spasms.   30 tablet   0   . Fluticasone-Salmeterol (ADVAIR DISKUS) 100-50 MCG/DOSE AEPB   Inhalation   Inhale 1 puff into the lungs 2 (two) times daily.   60 each   0   . furosemide (LASIX) 20 MG tablet   Oral   Take 2 tablets (40 mg total) by mouth 2 (two) times daily.   60 tablet   0   . metoprolol succinate (TOPROL XL) 25 MG 24 hr tablet   Oral   Take 1 tablet (25 mg total) by mouth daily.   30 tablet   0   . omeprazole (PRILOSEC) 20 MG capsule   Oral   Take 20 mg by mouth 2 (two) times daily before a meal.         . oxyCODONE (OXY IR/ROXICODONE) 5 MG immediate release tablet  Oral   Take 1 tablet (5 mg total) by mouth every 6 (six) hours as needed for severe pain.   40 tablet   0   . oxyCODONE-acetaminophen (ROXICET) 5-325 MG per tablet   Oral   Take 1 tablet by mouth every 6 (six) hours as needed for severe pain.   12 tablet   0   . sulfamethoxazole-trimethoprim (BACTRIM DS) 800-160 MG per tablet   Oral   Take 1 tablet by mouth 2 (two) times daily.   14 tablet   0   . tiotropium (SPIRIVA HANDIHALER) 18 MCG inhalation capsule   Inhalation   Place 1 capsule (18 mcg total) into inhaler and inhale daily.   30 capsule   0      Allergies Lisinopril   Family History  Problem Relation Age of Onset  . Heart attack Mother   . Cancer Father   . COPD Father     Social History Social History  Substance Use Topics  . Smoking status: Current Every Day Smoker    Types:  Cigarettes  . Smokeless tobacco: None  . Alcohol Use: No    Review of Systems  Constitutional:   No fever or chills. No weight changes Eyes:   No blurry vision or double vision.  ENT:   No sore throat. Cardiovascular:   No chest pain. Respiratory:   Positive dyspnea without cough Gastrointestinal:   Positive suprapubic pain without, vomiting and diarrhea.  No BRBPR or melena. Genitourinary:   Negative for dysuria, urinary retention, bloody urine, or difficulty urinating. Musculoskeletal:   Negative for back pain. No joint swelling or pain. No flank pain Skin:   Negative for rash. Neurological:   Negative for headaches, focal weakness or numbness. Psychiatric:  No anxiety or depression.   Endocrine:  No hot/cold intolerance, changes in energy, or sleep difficulty.  10-point ROS otherwise negative.  ____________________________________________   PHYSICAL EXAM:  VITAL SIGNS: ED Triage Vitals  Enc Vitals Group     BP 06/06/15 1024 149/105 mmHg     Pulse Rate 06/06/15 1024 108     Resp 06/06/15 1024 28     Temp 06/06/15 1024 97.7 F (36.5 C)     Temp Source 06/06/15 1024 Oral     SpO2 06/06/15 1024 95 %     Weight 06/06/15 1024 179 lb (81.194 kg)     Height 06/06/15 1024 6\' 2"  (1.88 m)     Head Cir --      Peak Flow --      Pain Score 06/06/15 1025 9     Pain Loc --      Pain Edu? --      Excl. in [address]? --      Constitutional:   Alert and oriented. Well appearing and in no distress. Eyes:   No scleral icterus. No conjunctival pallor. PERRL. EOMI ENT   Head:   Normocephalic and atraumatic.   Nose:   No congestion/rhinnorhea. No septal hematoma   Mouth/Throat:   MMM, no pharyngeal erythema. No peritonsillar mass. No uvula shift.   Neck:   No stridor. No SubQ emphysema. No meningismus. Hematological/Lymphatic/Immunilogical:   No cervical lymphadenopathy. Cardiovascular:   Tachycardia heart rate 110. Normal and symmetric distal pulses are present in all  extremities. No murmurs, rubs, or gallops. Respiratory:   Normal respiratory effort without tachypnea nor retractions. Diminished breath sounds on the right diffusely. No wheezes/rales/rhonchi. Gastrointestinal:   Soft with suprapubic tenderness. Mild distention . There is no  CVA tenderness.  No rebound, rigidity, or guarding. Genitourinary:   deferred Musculoskeletal:   Nontender with normal range of motion in all extremities. No joint effusions.  No lower extremity tenderness.  No edema. Neurologic:   Normal speech and language.  CN 2-10 normal. Motor grossly intact. No pronator drift.  Normal gait. No gross focal neurologic deficits are appreciated.  Skin:    Skin is warm, dry and intact. No rash noted.  No petechiae, purpura, or bullae. Psychiatric:   Mood and affect are normal. Speech and behavior are normal. Patient exhibits appropriate insight and judgment.  ____________________________________________    LABS (pertinent positives/negatives) (all labs ordered are listed, but only abnormal results are displayed) Labs Reviewed  URINALYSIS COMPLETEWITH MICROSCOPIC ([facility] ONLY) - Abnormal; Notable for the following:    Color, Urine YELLOW (*)    APPearance TURBID (*)    Hgb urine dipstick 2+ (*)    Protein, ur 100 (*)    Leukocytes, UA 2+ (*)    Squamous Epithelial / LPF 6-30 (*)    All other components within normal limits  COMPREHENSIVE METABOLIC PANEL - Abnormal; Notable for the following:    CO2 21 (*)    Glucose, Bld 124 (*)    BUN 36 (*)    Creatinine, Ser 2.78 (*)    ALT 14 (*)    Total Bilirubin 1.4 (*)    GFR calc non Af Amer 21 (*)    GFR calc Af Amer 25 (*)    All other components within normal limits  CBC WITH DIFFERENTIAL/PLATELET - Abnormal; Notable for the following:    WBC 17.3 (*)    RBC 5.99 (*)    MCV 76.5 (*)    MCH 24.1 (*)    MCHC 31.5 (*)    RDW 20.4 (*)    Neutro Abs 15.3 (*)    Lymphs Abs 0.7 (*)    Monocytes Absolute 1.2 (*)    All other  components within normal limits  URINE CULTURE  TROPONIN I   ____________________________________________   EKG Interpreted by me Sinus tachycardia rate 109, leftward axis, normal intervals. Poor R-wave progression in anterior precordial leads. Normal ST segments and T waves. ____________________________________________    RAD Chest x-ray reveals a moderate right-sided pleural effusion. Abdominal x-ray reveals large stool burden CT stone study reveals moderate right hydronephrosis with an indwelling ureteral catheter, no obstruction. ____________________________________________   PROCEDURES   ____________________________________________   INITIAL IMPRESSION / ASSESSMENT AND PLAN / ED COURSE  Pertinent labs & imaging results that were available during my care of the patient were reviewed by me and considered in my medical decision making (see chart for details).   patient presents mainly with abdominal pain. We'll check his urine and also check a chest x-ray as I suspect his pleural effusion is reaccumulating.  ----------------------------------------- 2:48 PM on 06/06/2015 -----------------------------------------  Patient remained stable, and comfortable. Despite the tachycardia which has improved somewhat with pain control, he feels well and wants to go home. Workup is sig. For cystitis and I suspect pyelonephritis, but he is not septic, WA, NT.  D/w uro Dr. Tresa Moore who reviewed images and agrees nothing to do from uro standpoint, can f/u with Dr. Bernardo Heater.  No resp failure presently, can f/u with Dr. Carlean Jews (PCP) regarding reaccumulating R pleural effusion.  Has knonw AAA that is s/p repair. Acute on chronic renal insufficiency - patient informed.  Ceftriaxone given in ED, started Bactrim PO.      ____________________________________________  FINAL CLINICAL IMPRESSION(S) / ED DIAGNOSES  Final diagnoses:  Abdominal distension  Generalized abdominal pain  Pleural effusion,  right  Cystitis  Acute on chronic renal insufficiency      Carrie Mew, MD 06/06/15 1455

## 2015-06-09 LAB — URINE CULTURE: Culture: >=100000

## 2015-06-11 ENCOUNTER — Telehealth: Payer: Self-pay | Admitting: Pharmacist

## 2015-06-11 NOTE — Telephone Encounter (Signed)
Expand All Collapse All   On 06/10/15 Pt urine culture from ED visit came back postive for candida albicans. Pt was contacted. Offered to call in RX. Pt accepted. RX for Fluconazole 100mg  daily x14 was called into [facility] in [place]. Rx approved by Dr. Harvest Dark

## 2015-06-11 NOTE — Progress Notes (Signed)
Pt urine culture from ED visit came back postive for candida albicans. Pt was contacted. Offered to call in RX. Pt accepted. RX for Fluconazole 100mg  daily x14 was called into [facility] in [place]. Rx approved by Dr. Harvest Dark  Ramond Dial, Pharm.D Clinical Pharmacist

## 2015-06-12 ENCOUNTER — Encounter: Payer: Self-pay | Admitting: Emergency Medicine

## 2015-06-12 ENCOUNTER — Inpatient Hospital Stay
Admission: EM | Admit: 2015-06-12 | Discharge: 2015-06-14 | DRG: 291 | Disposition: A | Discharge status: Home Health | Payer: Medicare Other | Attending: Internal Medicine | Admitting: Internal Medicine

## 2015-06-12 ENCOUNTER — Inpatient Hospital Stay: Payer: Medicare Other

## 2015-06-12 ENCOUNTER — Emergency Department: Payer: Medicare Other

## 2015-06-12 ENCOUNTER — Other Ambulatory Visit: Payer: Self-pay

## 2015-06-12 DIAGNOSIS — Z825 Family history of asthma and other chronic lower respiratory diseases: Secondary | ICD-10-CM

## 2015-06-12 DIAGNOSIS — N184 Chronic kidney disease, stage 4 (severe): Secondary | ICD-10-CM | POA: Diagnosis present

## 2015-06-12 DIAGNOSIS — J9600 Acute respiratory failure, unspecified whether with hypoxia or hypercapnia: Secondary | ICD-10-CM | POA: Diagnosis not present

## 2015-06-12 DIAGNOSIS — Z809 Family history of malignant neoplasm, unspecified: Secondary | ICD-10-CM

## 2015-06-12 DIAGNOSIS — R319 Hematuria, unspecified: Secondary | ICD-10-CM | POA: Diagnosis present

## 2015-06-12 DIAGNOSIS — R0603 Acute respiratory distress: Secondary | ICD-10-CM

## 2015-06-12 DIAGNOSIS — H35039 Hypertensive retinopathy, unspecified eye: Secondary | ICD-10-CM | POA: Diagnosis present

## 2015-06-12 DIAGNOSIS — F17210 Nicotine dependence, cigarettes, uncomplicated: Secondary | ICD-10-CM | POA: Diagnosis present

## 2015-06-12 DIAGNOSIS — H3530 Unspecified macular degeneration: Secondary | ICD-10-CM | POA: Diagnosis present

## 2015-06-12 DIAGNOSIS — K219 Gastro-esophageal reflux disease without esophagitis: Secondary | ICD-10-CM | POA: Diagnosis present

## 2015-06-12 DIAGNOSIS — Z888 Allergy status to other drugs, medicaments and biological substances status: Secondary | ICD-10-CM

## 2015-06-12 DIAGNOSIS — E785 Hyperlipidemia, unspecified: Secondary | ICD-10-CM | POA: Diagnosis present

## 2015-06-12 DIAGNOSIS — I130 Hypertensive heart and chronic kidney disease with heart failure and stage 1 through stage 4 chronic kidney disease, or unspecified chronic kidney disease: Secondary | ICD-10-CM | POA: Diagnosis present

## 2015-06-12 DIAGNOSIS — J449 Chronic obstructive pulmonary disease, unspecified: Secondary | ICD-10-CM | POA: Diagnosis not present

## 2015-06-12 DIAGNOSIS — I5022 Chronic systolic (congestive) heart failure: Secondary | ICD-10-CM

## 2015-06-12 DIAGNOSIS — Z9889 Other specified postprocedural states: Secondary | ICD-10-CM | POA: Diagnosis not present

## 2015-06-12 DIAGNOSIS — I5043 Acute on chronic combined systolic (congestive) and diastolic (congestive) heart failure: Principal | ICD-10-CM | POA: Diagnosis present

## 2015-06-12 DIAGNOSIS — Z9981 Dependence on supplemental oxygen: Secondary | ICD-10-CM

## 2015-06-12 DIAGNOSIS — I509 Heart failure, unspecified: Secondary | ICD-10-CM | POA: Diagnosis not present

## 2015-06-12 DIAGNOSIS — R0602 Shortness of breath: Secondary | ICD-10-CM

## 2015-06-12 DIAGNOSIS — Z8679 Personal history of other diseases of the circulatory system: Secondary | ICD-10-CM

## 2015-06-12 DIAGNOSIS — J95811 Postprocedural pneumothorax: Secondary | ICD-10-CM

## 2015-06-12 DIAGNOSIS — Z8249 Family history of ischemic heart disease and other diseases of the circulatory system: Secondary | ICD-10-CM | POA: Diagnosis not present

## 2015-06-12 DIAGNOSIS — R911 Solitary pulmonary nodule: Secondary | ICD-10-CM | POA: Diagnosis present

## 2015-06-12 DIAGNOSIS — Z7982 Long term (current) use of aspirin: Secondary | ICD-10-CM

## 2015-06-12 DIAGNOSIS — J90 Pleural effusion, not elsewhere classified: Secondary | ICD-10-CM | POA: Diagnosis not present

## 2015-06-12 DIAGNOSIS — Z87442 Personal history of urinary calculi: Secondary | ICD-10-CM | POA: Diagnosis not present

## 2015-06-12 DIAGNOSIS — J9620 Acute and chronic respiratory failure, unspecified whether with hypoxia or hypercapnia: Secondary | ICD-10-CM | POA: Diagnosis present

## 2015-06-12 DIAGNOSIS — J189 Pneumonia, unspecified organism: Secondary | ICD-10-CM | POA: Diagnosis not present

## 2015-06-12 DIAGNOSIS — J9621 Acute and chronic respiratory failure with hypoxia: Secondary | ICD-10-CM

## 2015-06-12 DIAGNOSIS — Z720 Tobacco use: Secondary | ICD-10-CM | POA: Diagnosis not present

## 2015-06-12 DIAGNOSIS — J690 Pneumonitis due to inhalation of food and vomit: Secondary | ICD-10-CM

## 2015-06-12 LAB — HEPATIC FUNCTION PANEL
ALT: 19 U/L (ref 17–63)
AST: 21 U/L (ref 15–41)
Albumin: 4 g/dL (ref 3.5–5.0)
Alkaline Phosphatase: 139 U/L — ABNORMAL HIGH (ref 38–126)
BILIRUBIN DIRECT: 0.3 mg/dL (ref 0.1–0.5)
BILIRUBIN TOTAL: 1 mg/dL (ref 0.3–1.2)
Indirect Bilirubin: 0.7 mg/dL (ref 0.3–0.9)
Total Protein: 7.4 g/dL (ref 6.5–8.1)

## 2015-06-12 LAB — URINALYSIS COMPLETE WITH MICROSCOPIC (ARMC ONLY)
BILIRUBIN URINE: NEGATIVE
Bacteria, UA: NONE SEEN
Glucose, UA: NEGATIVE mg/dL
KETONES UR: NEGATIVE mg/dL
Nitrite: NEGATIVE
PH: 6 (ref 5.0–8.0)
Protein, ur: 100 mg/dL — AB
Specific Gravity, Urine: 1.01 (ref 1.005–1.030)
Squamous Epithelial / LPF: NONE SEEN

## 2015-06-12 LAB — CBC WITH DIFFERENTIAL/PLATELET
BASOS ABS: 0.1 10*3/uL (ref 0–0.1)
Basophils Relative: 0 %
Eosinophils Absolute: 0.2 10*3/uL (ref 0–0.7)
Eosinophils Relative: 1 %
HEMATOCRIT: 44.5 % (ref 40.0–52.0)
Hemoglobin: 13.7 g/dL (ref 13.0–18.0)
LYMPHS ABS: 0.7 10*3/uL — AB (ref 1.0–3.6)
MCH: 23.7 pg — ABNORMAL LOW (ref 26.0–34.0)
MCHC: 30.7 g/dL — ABNORMAL LOW (ref 32.0–36.0)
MCV: 77.2 fL — AB (ref 80.0–100.0)
MONO ABS: 1.2 10*3/uL — AB (ref 0.2–1.0)
Monocytes Relative: 5 %
NEUTROS ABS: 24.9 10*3/uL — AB (ref 1.4–6.5)
Neutrophils Relative %: 91 %
Platelets: 244 10*3/uL (ref 150–440)
RBC: 5.77 MIL/uL (ref 4.40–5.90)
RDW: 19.9 % — AB (ref 11.5–14.5)
WBC: 27 10*3/uL — ABNORMAL HIGH (ref 3.8–10.6)

## 2015-06-12 LAB — BASIC METABOLIC PANEL
Anion gap: 11 (ref 5–15)
BUN: 42 mg/dL — AB (ref 6–20)
CALCIUM: 9.5 mg/dL (ref 8.9–10.3)
CO2: 24 mmol/L (ref 22–32)
CREATININE: 2.29 mg/dL — AB (ref 0.61–1.24)
Chloride: 103 mmol/L (ref 101–111)
GFR calc Af Amer: 31 mL/min — ABNORMAL LOW (ref >60)
GFR calc non Af Amer: 27 mL/min — ABNORMAL LOW (ref >60)
GLUCOSE: 133 mg/dL — AB (ref 65–99)
Potassium: 5 mmol/L (ref 3.5–5.1)
Sodium: 138 mmol/L (ref 135–145)

## 2015-06-12 LAB — BLOOD GAS, ARTERIAL
ALLENS TEST (PASS/FAIL): POSITIVE — AB
Acid-base deficit: 4.5 mmol/L — ABNORMAL HIGH (ref 0.0–2.0)
BICARBONATE: 19.2 meq/L — AB (ref 21.0–28.0)
Expiratory PAP: 6
FIO2: 0.4
Inspiratory PAP: 14
LHR: 10 {breaths}/min
Mode: POSITIVE
O2 Saturation: 97.6 %
PATIENT TEMPERATURE: 37
pCO2 arterial: 31 mmHg — ABNORMAL LOW (ref 32.0–48.0)
pH, Arterial: 7.4 (ref 7.350–7.450)
pO2, Arterial: 98 mmHg (ref 83.0–108.0)

## 2015-06-12 LAB — PROTIME-INR
INR: 1.17
Prothrombin Time: 15.1 seconds — ABNORMAL HIGH (ref 11.4–15.0)

## 2015-06-12 LAB — BRAIN NATRIURETIC PEPTIDE

## 2015-06-12 LAB — TROPONIN I: Troponin I: 0.06 ng/mL — ABNORMAL HIGH (ref <0.031)

## 2015-06-12 LAB — MRSA PCR SCREENING: MRSA BY PCR: NEGATIVE

## 2015-06-12 LAB — LIPASE, BLOOD: LIPASE: 38 U/L (ref 22–51)

## 2015-06-12 MED ORDER — SODIUM CHLORIDE 0.9 % IV BOLUS (SEPSIS): 500.0000 mL | Freq: Once | INTRAVENOUS | Status: DC

## 2015-06-12 MED ORDER — SODIUM CHLORIDE 0.9 % IJ SOLN
3.0000 mL | Freq: Two times a day (BID) | INTRAMUSCULAR | Status: DC
Start: 2015-06-12 — End: 2015-06-14
  Administered 2015-06-12 – 2015-06-13 (×2): 3 mL via INTRAVENOUS

## 2015-06-12 MED ORDER — OXYCODONE-ACETAMINOPHEN 5-325 MG PO TABS
1.0000 | ORAL_TABLET | Freq: Four times a day (QID) | ORAL | Status: DC | PRN
  Administered 2015-06-13 (×2): 1 via ORAL
  Filled 2015-06-12 (×2): qty 1

## 2015-06-12 MED ORDER — ONDANSETRON HCL 4 MG/2ML IJ SOLN
4.0000 mg | Freq: Once | INTRAMUSCULAR | Status: AC
  Administered 2015-06-12: 4 mg via INTRAVENOUS

## 2015-06-12 MED ORDER — PANTOPRAZOLE SODIUM 40 MG PO TBEC
40.0000 mg | DELAYED_RELEASE_TABLET | Freq: Every day | ORAL | Status: DC
  Administered 2015-06-12 – 2015-06-14 (×3): 40 mg via ORAL
  Filled 2015-06-12 (×3): qty 1

## 2015-06-12 MED ORDER — IPRATROPIUM-ALBUTEROL 0.5-2.5 (3) MG/3ML IN SOLN
RESPIRATORY_TRACT | Status: AC
  Filled 2015-06-12: qty 9

## 2015-06-12 MED ORDER — ALBUTEROL SULFATE (2.5 MG/3ML) 0.083% IN NEBU
INHALATION_SOLUTION | RESPIRATORY_TRACT | Status: AC
  Administered 2015-06-12: 7.5 mg
  Filled 2015-06-12: qty 9

## 2015-06-12 MED ORDER — ASPIRIN 81 MG PO CHEW
324.0000 mg | CHEWABLE_TABLET | Freq: Once | ORAL | Status: AC
  Administered 2015-06-12: 324 mg via ORAL
  Filled 2015-06-12: qty 4

## 2015-06-12 MED ORDER — METHYLPREDNISOLONE SODIUM SUCC 125 MG IJ SOLR
125.0000 mg | Freq: Once | INTRAMUSCULAR | Status: AC
  Administered 2015-06-12: 125 mg via INTRAVENOUS

## 2015-06-12 MED ORDER — TIOTROPIUM BROMIDE MONOHYDRATE 18 MCG IN CAPS
18.0000 ug | ORAL_CAPSULE | Freq: Every day | RESPIRATORY_TRACT | Status: DC
  Administered 2015-06-13 – 2015-06-14 (×2): 18 ug via RESPIRATORY_TRACT
  Filled 2015-06-12: qty 5

## 2015-06-12 MED ORDER — MOMETASONE FURO-FORMOTEROL FUM 100-5 MCG/ACT IN AERO
2.0000 | INHALATION_SPRAY | Freq: Two times a day (BID) | RESPIRATORY_TRACT | Status: DC
  Administered 2015-06-12 – 2015-06-14 (×4): 2 via RESPIRATORY_TRACT
  Filled 2015-06-12: qty 8.8

## 2015-06-12 MED ORDER — CEFTRIAXONE SODIUM 1 G IJ SOLR
1.0000 g | Freq: Once | INTRAMUSCULAR | Status: AC
  Administered 2015-06-12: 1 g via INTRAVENOUS
  Filled 2015-06-12: qty 10

## 2015-06-12 MED ORDER — ASPIRIN EC 81 MG PO TBEC
81.0000 mg | DELAYED_RELEASE_TABLET | Freq: Every day | ORAL | Status: DC
  Administered 2015-06-13 – 2015-06-14 (×2): 81 mg via ORAL
  Filled 2015-06-12 (×3): qty 1

## 2015-06-12 MED ORDER — LORAZEPAM 2 MG/ML IJ SOLN
1.0000 mg | Freq: Once | INTRAMUSCULAR | Status: AC
  Administered 2015-06-12: 1 mg via INTRAVENOUS
  Filled 2015-06-12: qty 1

## 2015-06-12 MED ORDER — IPRATROPIUM-ALBUTEROL 0.5-2.5 (3) MG/3ML IN SOLN
RESPIRATORY_TRACT | Status: AC
  Filled 2015-06-12: qty 6

## 2015-06-12 MED ORDER — SODIUM CHLORIDE 0.9 % IV SOLN: 250.0000 mL | INTRAVENOUS | Status: DC | PRN

## 2015-06-12 MED ORDER — METOPROLOL SUCCINATE ER 25 MG PO TB24
25.0000 mg | ORAL_TABLET | Freq: Every day | ORAL | Status: DC
  Administered 2015-06-12: 25 mg via ORAL
  Filled 2015-06-12 (×2): qty 1

## 2015-06-12 MED ORDER — ALBUTEROL SULFATE (2.5 MG/3ML) 0.083% IN NEBU: 7.5000 mg/h | INHALATION_SOLUTION | Freq: Once | RESPIRATORY_TRACT | Status: DC

## 2015-06-12 MED ORDER — FUROSEMIDE 10 MG/ML IJ SOLN
40.0000 mg | Freq: Two times a day (BID) | INTRAMUSCULAR | Status: DC
  Administered 2015-06-12 – 2015-06-13 (×2): 40 mg via INTRAVENOUS
  Filled 2015-06-12 (×2): qty 4

## 2015-06-12 MED ORDER — SODIUM CHLORIDE 0.9 % IJ SOLN: 3.0000 mL | INTRAMUSCULAR | Status: DC | PRN

## 2015-06-12 MED ORDER — MORPHINE SULFATE (PF) 2 MG/ML IV SOLN: 2.0000 mg | INTRAVENOUS | Status: DC | PRN

## 2015-06-12 MED ORDER — DIAZEPAM 5 MG PO TABS
5.0000 mg | ORAL_TABLET | Freq: Two times a day (BID) | ORAL | Status: DC | PRN
  Administered 2015-06-13 – 2015-06-14 (×2): 5 mg via ORAL
  Filled 2015-06-12 (×2): qty 1

## 2015-06-12 MED ORDER — FLUCONAZOLE 100 MG PO TABS
100.0000 mg | ORAL_TABLET | Freq: Every day | ORAL | Status: DC
  Administered 2015-06-13 – 2015-06-14 (×2): 100 mg via ORAL
  Filled 2015-06-12 (×3): qty 1

## 2015-06-12 MED ORDER — OXYCODONE HCL 5 MG PO TABS
5.0000 mg | ORAL_TABLET | Freq: Four times a day (QID) | ORAL | Status: DC | PRN
  Administered 2015-06-13 – 2015-06-14 (×3): 5 mg via ORAL
  Filled 2015-06-12 (×3): qty 1

## 2015-06-12 MED ORDER — ONDANSETRON HCL 4 MG/2ML IJ SOLN
INTRAMUSCULAR | Status: AC
  Administered 2015-06-12: 4 mg via INTRAVENOUS
  Filled 2015-06-12: qty 2

## 2015-06-12 MED ORDER — DEXTROSE 5 % IV SOLN
500.0000 mg | Freq: Once | INTRAVENOUS | Status: AC
  Administered 2015-06-12: 500 mg via INTRAVENOUS
  Filled 2015-06-12: qty 500

## 2015-06-12 MED ORDER — PIPERACILLIN-TAZOBACTAM 3.375 G IVPB
3.3750 g | Freq: Three times a day (TID) | INTRAVENOUS | Status: DC
  Administered 2015-06-12 – 2015-06-14 (×6): 3.375 g via INTRAVENOUS
  Filled 2015-06-12 (×10): qty 50

## 2015-06-12 MED ORDER — LEVOFLOXACIN IN D5W 750 MG/150ML IV SOLN
750.0000 mg | INTRAVENOUS | Status: DC
  Administered 2015-06-13: 750 mg via INTRAVENOUS
  Filled 2015-06-12 (×2): qty 150

## 2015-06-12 MED ORDER — METHYLPREDNISOLONE SODIUM SUCC 125 MG IJ SOLR
INTRAMUSCULAR | Status: AC
  Administered 2015-06-12: 125 mg via INTRAVENOUS
  Filled 2015-06-12: qty 2

## 2015-06-12 NOTE — Consult Note (Addendum)
[facility] Consultation     ASSESSMENT/PLAN   The patient is a 74 year old Caucasian male who sees Dr. Raul Del as his usual pulmonary, doctor. He has a history of sniffing and for congestive heart failure, COPD, aortic abdominal aneurysm status post repair, chronic kidney disease, pleural effusion, previous thoracentesis, hospital admission for pneumonia. He presents now with acute respiratory failure, which is BiPAP dependent. Chest x-ray showed right-sided pleural effusion, I was called by Dr. Wynetta Emery who is his hospitalist physician for consultation the patient was subsequently sent for a thoracentesis by interventional radiology with subsequent improvement in his respiratory failure. The patient sees Dr. Raul Del as his usual pulmonologist.  PULMONARY  A: Acute respiratory failure secondary to right-sided pleural effusion. -Pulmonary edema. -COPD, which appears to be chronic, not in exacerbation at this time. -Left upper lobe lung nodule.  P:    -Reviewed the chest x-ray images today, both images and reports the initial chest ray shows a moderate right-sided pleural effusion occupying approximately 40% of the volume of the right pleural space. Subsequently after thoracentesis, this has decreased significantly. -Continue diuresis. -Wean off steroids. -Continue home Advair and Spiriva.  I also reviewed the CT chest from 03/04/2015, there does appear to be pleural effusion with compressive atelectasis, there is an accessory azygous lobe, which is an incidental finding. There is a left upper lobe 7 mm nodule.    CARDIOVASCULAR  A: Diastolic congestive heart failure with volume overload P:  Continue diuresis.  RENAL A:  Chronic kidney disease P:   Will monitor kidney function, hold nephrotoxic medications.  GASTROINTESTINAL Stable  HEMATOLOGIC A:  The patient presented with leukocytosis P:  Possibly stress reaction versus infection, he does not  appear to have any evidence of pneumonia at this time.   INFECTIOUS A:  No evidence of pneumonia at this time. However, the patient does have leukocytosis P:   He has been started empirically antibiotics, will await results of his urinary and blood cultures. BCx2 and urine culture. 06/12/2015 pending  Abx: Ceftriaxone, Levaquin start date 06/12/2015  ENDOCRINE Stable  NEUROLOGIC Stable   MAJOR EVENTS/TEST RESULTS: Right thoracentesis by IR, removed ? Liters >> pt improved.   INDWELLING DEVICES:: Peripheral lines only.  The case was discussed with the critical care RN , with the patient's wife, and with the patient's hospitalist, Dr. Wynetta Emery. ---------------------------------------  ---------------------------------------   Name: Collin [name] MRN: [id number] DOB: 09-24-1940    ADMISSION DATE:  06/12/2015 CONSULTATION DATE:  06/12/2015  REFERRING MD :  Dr. Wynetta Emery  CHIEF COMPLAINT:  dyspnea   HISTORY OF PRESENT ILLNESS:   The patient is a 74 year old Caucasian male. He is currently somewhat lethargic, though arousable. Therefore, all history was obtained from the chart and the patient's wife was at bedside. The patient has a history of COPD as well as congestive heart failure, the patient's wife tells me that  he has had to go for thoracentesis in the past. She thinks probably last in April. She notes that he was becoming increasingly short winded over the last 1 week, and this culminated over the last few days when he could not catch his breath, he was subsequent brought into the emergency room and found to have conversational dyspnea. He was plan to see his ulnar doctor, Dr. Raul Del soon. He has been maintained on Advair and Spiriva for COPD, he is had an admission to the hospital in the past for pneumonia  PAST MEDICAL HISTORY :  Past Medical History  Diagnosis Date  . COPD (chronic obstructive pulmonary disease) ([place])   . Chronic systolic CHF (congestive heart failure)  ([place])   . Kidney stone   . Macular degeneration   . HLD (hyperlipidemia)   . Hypertensive retinopathy   . HTN (hypertension)   . GERD (gastroesophageal reflux disease)    Past Surgical History  Procedure Laterality Date  . Abdominal aortic aneurysm repair    . Hernia repair    . Kidney stone surgery     Prior to Admission medications   Medication Sig Start Date End Date Taking? Authorizing Provider  albuterol (PROVENTIL HFA;VENTOLIN HFA) 108 (90 BASE) MCG/ACT inhaler Inhale 2 puffs into the lungs every 6 (six) hours as needed for wheezing or shortness of breath.    Yes Historical Provider, MD  aspirin 81 MG tablet Take 81 mg by mouth daily as needed for pain.    Yes Historical Provider, MD  fluconazole (DIFLUCAN) 100 MG tablet Take 100 mg by mouth daily. X 14 days. 06/10/15  Yes Historical Provider, MD  Fluticasone-Salmeterol (ADVAIR DISKUS) 100-50 MCG/DOSE AEPB Inhale 1 puff into the lungs 2 (two) times daily. 03/06/15  Yes Srikar Sudini, MD  furosemide (LASIX) 20 MG tablet Take 2 tablets (40 mg total) by mouth 2 (two) times daily. Patient taking differently: Take 20 mg by mouth 2 (two) times daily.  03/21/15  Yes Loletha Grayer, MD  Multiple Vitamin (MULTIVITAMIN) tablet Take 1 tablet by mouth daily.   Yes Historical Provider, MD  nitroGLYCERIN (NITROSTAT) 0.4 MG SL tablet Take 0.4 mg by mouth every 5 (five) minutes x 3 doses as needed. For chest pain. If no relief call MD or go to emergency room. 10/25/12  Yes Historical Provider, MD  Omega-3 1000 MG CAPS Take 1,000 mg by mouth daily.   Yes Historical Provider, MD  omeprazole (PRILOSEC) 20 MG capsule Take 20 mg by mouth 2 (two) times daily before a meal.   Yes Historical Provider, MD  tiotropium (SPIRIVA HANDIHALER) 18 MCG inhalation capsule Place 1 capsule (18 mcg total) into inhaler and inhale daily. 03/06/15  Yes Hillary Bow, MD   Allergies  Allergen Reactions  . Lisinopril Swelling    Pt reports throat swelling.  . Metoprolol      Other reaction(s): Other (See Comments) Caused ringing in ears    FAMILY HISTORY:  Family History  Problem Relation Age of Onset  . Heart attack Mother   . Cancer Father   . COPD Father    SOCIAL HISTORY:  reports that he has been smoking Cigarettes.  He does not have any smokeless tobacco history on file. He reports that he does not drink alcohol or use illicit drugs.  REVIEW OF SYSTEMS:   Constitutional: Feels well. Cardiovascular: No chest pain.  Pulmonary: Denies dyspnea.   The remainder of systems were reviewed and were found to be negative other than what is documented in the HPI.    VITAL SIGNS: Temp:  [97.3 F (36.3 C)] 97.3 F (36.3 C) (10/01 1536) Pulse Rate:  [103-128] 105 (10/01 1618) Resp:  [20-32] 31 (10/01 1618) BP: (120-161)/(83-96) 124/83 mmHg (10/01 1600) SpO2:  [90 %-99 %] 93 % (10/01 1618) Weight:  [82.101 kg (181 lb)] 82.101 kg (181 lb) (10/01 1132) HEMODYNAMICS:   VENTILATOR SETTINGS:   INTAKE / OUTPUT: No intake or output data in the 24 hours ending 06/12/15 1645  Physical Examination:   VS: BP 124/83 mmHg  Pulse 105  Temp(Src) 97.3 F (36.3 C) (Axillary)  Resp  31  Ht 6\' 2"  (1.88 m)  Wt 82.101 kg (181 lb)  BMI 23.23 kg/m2  SpO2 93%  General Appearance: No distress  Neuro:without focal findings, mental status reduced with lethargy, though the patient is arousable. HEENT: PERRLA, EOM intact, no ptosis,  Pulmonary: normal breath sounds., diaphragmatic excursion normal.  decreased breath sounds on the right side.   CardiovascularNormal S1,S2.  No m/r/g.    Abdomen: Benign, Soft, non-tender, No masses,  Renal:  No costovertebral tenderness  GU:  Not performed at this time. Endoc: No evident thyromegaly, no signs of acromegaly. Skin:   warm, no rashes, no ecchymosis  Extremities: normal, no cyanosis, clubbing,    LABS: Reviewed   LABORATORY PANEL:   CBC  Recent Labs Lab 06/12/15 1147  WBC 27.0*  HGB 13.7  HCT 44.5  PLT 244      Chemistries   Recent Labs Lab 06/12/15 1147  NA 138  K 5.0  CL 103  CO2 24  GLUCOSE 133*  BUN 42*  CREATININE 2.29*  CALCIUM 9.5  AST 21  ALT 19  ALKPHOS 139*  BILITOT 1.0    No results for input(s): GLUCAP in the last 168 hours.  Recent Labs Lab 06/12/15 1535  PHART 7.40  PCO2ART 31*  PO2ART 98    Recent Labs Lab 06/06/15 1050 06/12/15 1147  AST 22 21  ALT 14* 19  ALKPHOS 118 139*  BILITOT 1.4* 1.0  ALBUMIN 4.0 4.0    Cardiac Enzymes  Recent Labs Lab 06/12/15 1147  TROPONINI 0.06*    RADIOLOGY:  Dg Chest 1 View  06/12/2015   CLINICAL DATA:  Status post right thoracentesis for pleural effusion.  EXAM: CHEST 1 VIEW  COMPARISON:  06/12/2015 and prior radiograph  FINDINGS: The right pleural effusion has decreased in size, now small to moderate. Right basilar atelectasis is again identified. There is no evidence of pneumothorax.  Cardiomegaly is again noted.  Mild pulmonary vascular congestion is unchanged.  IMPRESSION: Decreased right pleural effusion status post thoracentesis. No evidence of pneumothorax.   Electronically Signed   By: Margarette Canada M.D.   On: 06/12/2015 15:20   Dg Chest Port 1 View  06/12/2015   CLINICAL DATA:  Shortness of breath for 2 days.  Wheezing.  EXAM: PORTABLE CHEST 1 VIEW  COMPARISON:  June 06, 2015  FINDINGS: There remains a moderate to large right pleural effusion with right middle and lower lobe consolidation. Lungs elsewhere clear. Heart is enlarged with pulmonary vascularity within normal limits. No adenopathy. There is atherosclerotic change in the aorta. Incidental note is made of an azygos lobe on the right, an anatomic variant.  IMPRESSION: Fairly sizable pleural effusion on the right with right middle and lower lobe consolidation. Left lung clear. Stable cardiomegaly. No new opacity.   Electronically Signed   By: Lowella Grip III M.D.   On: 06/12/2015 12:18       --Deep Ashby Dawes, MD.  Board Certified in  Internal Medicine, Pulmonary Medicine, [place], and Sleep Medicine.  Pager [phone] [facility] Office Number: [phone]  Patricia Pesa, M.D.  Vilinda Boehringer, M.D.  Merton Border, M.D  [place].  I have personally obtained a history, examined the patient, evaluated laboratory and imaging results, formulated the assessment and plan and placed orders. The Patient requires high complexity decision making for assessment and support, frequent evaluation and titration of therapies, application of advanced monitoring technologies and extensive interpretation of multiple databases. The patient  has critical illness that could lead imminently to failure of 1 or more organ systems and requires the highest level of physician preparedness to intervene.  Critical Care Time devoted to patient care services described in this note is 35 minutes and is exclusive of time spent in procedures.    06/12/2015, 4:45 PM

## 2015-06-12 NOTE — Procedures (Signed)
US thoracentesis RIGHT No complication No blood loss. See complete dictation in [facility]. CXR to follow

## 2015-06-12 NOTE — H&P (Signed)
Collin [name] is an 74 y.o. male.   Chief Complaint: Shortness of breath HPI: Presents with 2 day history of worsening shortness of breath. Pt barely able to speak in complete sentences so most history is coming from wife. No fever or chills. Pt continues to smoke. Has history of pleural effusion which has been tapped multiple times. He was to see his pulmonologist on Monday. CXR shows large right pleural effusion and possible infiltrate.   Past Medical History  Diagnosis Date  . COPD (chronic obstructive pulmonary disease) ([place])   . Chronic systolic CHF (congestive heart failure) ([place])   . Kidney stone   . Macular degeneration   . HLD (hyperlipidemia)   . Hypertensive retinopathy   . HTN (hypertension)   . GERD (gastroesophageal reflux disease)     Past Surgical History  Procedure Laterality Date  . Abdominal aortic aneurysm repair    . Hernia repair    . Kidney stone surgery      Family History  Problem Relation Age of Onset  . Heart attack Mother   . Cancer Father   . COPD Father    Social History:  reports that he has been smoking Cigarettes.  He does not have any smokeless tobacco history on file. He reports that he does not drink alcohol or use illicit drugs.  Allergies:  Allergies  Allergen Reactions  . Lisinopril Swelling    Pt reports throat swelling.  . Metoprolol     Other reaction(s): Other (See Comments) Caused ringing in ears     (Not in a hospital admission)  Results for orders placed or performed during the hospital encounter of 06/12/15 (from the past 48 hour(s))  CBC with Differential     Status: Abnormal   Collection Time: 06/12/15 11:47 AM  Result Value Ref Range   WBC 27.0 (H) 3.8 - 10.6 K/uL   RBC 5.77 4.40 - 5.90 MIL/uL   Hemoglobin 13.7 13.0 - 18.0 g/dL    Comment: RESULT REPEATED AND VERIFIED   HCT 44.5 40.0 - 52.0 %   MCV 77.2 (L) 80.0 - 100.0 fL   MCH 23.7 (L) 26.0 - 34.0 pg   MCHC 30.7 (L) 32.0 - 36.0 g/dL   RDW 19.9 (H) 11.5 - 14.5  %   Platelets 244 150 - 440 K/uL   Neutrophils Relative % 91% %   Neutro Abs 24.9 (H) 1.4 - 6.5 K/uL   Lymphocytes Relative 3% %   Lymphs Abs 0.7 (L) 1.0 - 3.6 K/uL   Monocytes Relative 5% %   Monocytes Absolute 1.2 (H) 0.2 - 1.0 K/uL   Eosinophils Relative 1% %   Eosinophils Absolute 0.2 0 - 0.7 K/uL   Basophils Relative 0% %   Basophils Absolute 0.1 0 - 0.1 K/uL  Troponin I     Status: Abnormal   Collection Time: 06/12/15 11:47 AM  Result Value Ref Range   Troponin I 0.06 (H) <0.031 ng/mL    Comment: READ BACK AND VERIFIED WITH HEATHER FISHER AT 1233 06/12/15 DAS        PERSISTENTLY INCREASED TROPONIN VALUES IN THE RANGE OF 0.04-0.49 ng/mL CAN BE SEEN IN:       -UNSTABLE ANGINA       -CONGESTIVE HEART FAILURE       -MYOCARDITIS       -CHEST TRAUMA       -ARRYHTHMIAS       -LATE PRESENTING MYOCARDIAL INFARCTION       -COPD  CLINICAL FOLLOW-UP RECOMMENDED.   Protime-INR     Status: Abnormal   Collection Time: 06/12/15 11:47 AM  Result Value Ref Range   Prothrombin Time 15.1 (H) 11.4 - 15.0 seconds   INR 1.17   Urinalysis complete, with microscopic ([facility] only)     Status: Abnormal   Collection Time: 06/12/15 11:47 AM  Result Value Ref Range   Color, Urine YELLOW (A) YELLOW   APPearance TURBID (A) CLEAR   Glucose, UA NEGATIVE NEGATIVE mg/dL   Bilirubin Urine NEGATIVE NEGATIVE   Ketones, ur NEGATIVE NEGATIVE mg/dL   Specific Gravity, Urine 1.010 1.005 - 1.030   Hgb urine dipstick 3+ (A) NEGATIVE   pH 6.0 5.0 - 8.0   Protein, ur 100 (A) NEGATIVE mg/dL   Nitrite NEGATIVE NEGATIVE   Leukocytes, UA 3+ (A) NEGATIVE   RBC / HPF TOO NUMEROUS TO COUNT 0 - 5 RBC/hpf   WBC, UA TOO NUMEROUS TO COUNT 0 - 5 WBC/hpf   Bacteria, UA NONE SEEN NONE SEEN   Squamous Epithelial / LPF NONE SEEN NONE SEEN   WBC Clumps PRESENT   Basic metabolic panel     Status: Abnormal   Collection Time: 06/12/15 11:47 AM  Result Value Ref Range   Sodium 138 135 - 145 mmol/L   Potassium 5.0 3.5 -  5.1 mmol/L   Chloride 103 101 - 111 mmol/L   CO2 24 22 - 32 mmol/L   Glucose, Bld 133 (H) 65 - 99 mg/dL   BUN 42 (H) 6 - 20 mg/dL   Creatinine, Ser 2.29 (H) 0.61 - 1.24 mg/dL   Calcium 9.5 8.9 - 10.3 mg/dL   GFR calc non Af Amer 27 (L) >60 mL/min   GFR calc Af Amer 31 (L) >60 mL/min    Comment: (NOTE) The eGFR has been calculated using the CKD EPI equation. This calculation has not been validated in all clinical situations. eGFR's persistently <60 mL/min signify possible Chronic Kidney Disease.    Anion gap 11 5 - 15  Hepatic function panel     Status: Abnormal   Collection Time: 06/12/15 11:47 AM  Result Value Ref Range   Total Protein 7.4 6.5 - 8.1 g/dL   Albumin 4.0 3.5 - 5.0 g/dL   AST 21 15 - 41 U/L   ALT 19 17 - 63 U/L   Alkaline Phosphatase 139 (H) 38 - 126 U/L   Total Bilirubin 1.0 0.3 - 1.2 mg/dL   Bilirubin, Direct 0.3 0.1 - 0.5 mg/dL   Indirect Bilirubin 0.7 0.3 - 0.9 mg/dL  Lipase, blood     Status: None   Collection Time: 06/12/15 11:47 AM  Result Value Ref Range   Lipase 38 22 - 51 U/L   Dg Chest Port 1 View  06/12/2015   CLINICAL DATA:  Shortness of breath for 2 days.  Wheezing.  EXAM: PORTABLE CHEST 1 VIEW  COMPARISON:  June 06, 2015  FINDINGS: There remains a moderate to large right pleural effusion with right middle and lower lobe consolidation. Lungs elsewhere clear. Heart is enlarged with pulmonary vascularity within normal limits. No adenopathy. There is atherosclerotic change in the aorta. Incidental note is made of an azygos lobe on the right, an anatomic variant.  IMPRESSION: Fairly sizable pleural effusion on the right with right middle and lower lobe consolidation. Left lung clear. Stable cardiomegaly. No new opacity.   Electronically Signed   By: Lowella Grip III M.D.   On: 06/12/2015 12:18    Review of  Systems  Unable to perform ROS: acuity of condition    Blood pressure 153/88, pulse 117, resp. rate 20, height _0  (1.88 m), weight  82.101 kg (181 lb), SpO2 97 %. Physical Exam  Constitutional: He is oriented to person, place, and time.  White male in acute resp distress  HENT:  Head: Normocephalic and atraumatic.  Mouth/Throat: Oropharynx is clear and moist. No oropharyngeal exudate.  Eyes: EOM are normal. Pupils are equal, round, and reactive to light. No scleral icterus.  Neck: Neck supple. No JVD present. No tracheal deviation present. No thyromegaly present.  Cardiovascular:  Tachy with no murmurs  Respiratory: He is in respiratory distress.  No breath sounds on right side. Left lung field has scattered rhonci. Using accessary muscles. Dullness to percussion right lung files.  GI: Soft. Bowel sounds are normal. He exhibits no mass. There is no tenderness.  Musculoskeletal: He exhibits no edema or tenderness.  Lymphadenopathy:    He has no cervical adenopathy.  Neurological: He is alert and oriented to person, place, and time.  Unable to access cranial nerves secondary to pt being on bipap  Skin: Skin is warm and dry. No rash noted.     Assessment/Plan 1. Acute on Chronic Respiratory Failure: Pt requiring bipap. ABG pending. Pt continues to have labored breathing. Concerned patient may fatiqe and need intubating. Will get urgent thoracentisis to try to relieve some of his resp distress. Will admit to ICU on bipap. Pulmonary consulted. Discussed with pt and his wife about possible intubation if resp status worsens. They are agreeable if needed.  2. Pleural Effusion: Large right sided. Pt has had this tapped multiple times, each time has shown transudate which may be sec to CHF. Have consulted interventional radiology for thoracentisis.  3. Pneumonia: High white count and subjective fever. Impossible to tell on CXR with large pleral effusion. Will start abx to cover ICU pneumonia and culture.  4. CHF: Suspect this is contributing to resp failure. Will start IV lasix.  5. Stage 4 CRF: Cr up from recent. May be  secondary to poor perfusion. He has a history of being dialysed last Dec. Will consult nephrology.  6. COPD: No wheezing. Continue home meds. No need for steriods at this point.  Case discussed with Dr Burlene Arnt Past medical records reviewed.  Total CC time spent = 13mn     JBaxter Hire10/09/2014, 2:02 PM

## 2015-06-12 NOTE — ED Notes (Signed)
Pt to ed with c/o sob x 2 days.  Pt on home o2 at 2 lpm normally.  Pt with audible wheezing and obvious sob and difficulty breathing.  Pt sats 90%

## 2015-06-12 NOTE — ED Provider Notes (Addendum)
[facility] Emergency Department Provider Note  ____________________________________________   I have reviewed the triage vital signs and the nursing notes.   HISTORY  Chief Complaint Shortness of Breath    HPI Collin [name] is a 74 y.o. male with a history of right-sided pleural effusion with remote history of thoracentesis, history of COPD on 2 L home oxygen, history of continued tobacco abuse, history of ureteral stent and kidney stones, recently seen here for lower abdominal pain and dysuria. At that time a urine culture was obtained he was initially started on antibiotics, however, the culture was negative and he was started on anti-fungal's today. Patient has had for the last 3 days increasing dyspnea wheeze and cough which is occasionally productive. He has had no fevers. He had to go up on his home oxygen and is having some degree of respiratory distress. States she is still having hematuria. His family try to talk him into coming here yesterday but he elected to wait till today. He hasn't had a go up to 3 L on his home oxygen.    Past Medical History  Diagnosis Date  . COPD (chronic obstructive pulmonary disease) ([place])   . Chronic systolic CHF (congestive heart failure) ([place])   . Kidney stone   . Macular degeneration   . HLD (hyperlipidemia)   . Hypertensive retinopathy   . HTN (hypertension)   . GERD (gastroesophageal reflux disease)     Patient Active Problem List   Diagnosis Date Noted  . Acute on chronic systolic CHF (congestive heart failure) 03/19/2015  . Open wound of left heel 03/19/2015  . Recurrent right pleural effusion 03/04/2015  . Acute respiratory failure with hypoxemia 03/04/2015  . COPD (chronic obstructive pulmonary disease) 03/04/2015  . HLD (hyperlipidemia) 03/04/2015  . Chronic systolic CHF (congestive heart failure) 03/04/2015  . HTN (hypertension) 03/04/2015  . GERD (gastroesophageal reflux disease) 03/04/2015    Past  Surgical History  Procedure Laterality Date  . Abdominal aortic aneurysm repair    . Hernia repair    . Kidney stone surgery      Current Outpatient Rx  Name  Route  Sig  Dispense  Refill  . albuterol (PROVENTIL HFA;VENTOLIN HFA) 108 (90 BASE) MCG/ACT inhaler   Inhalation   Inhale into the lungs every 6 (six) hours as needed for wheezing or shortness of breath.         Marland Kitchen aspirin 81 MG tablet   Oral   Take 81 mg by mouth daily.         . clindamycin (CLEOCIN) 300 MG capsule   Oral   Take 1 capsule (300 mg total) by mouth 3 (three) times daily.   30 capsule   0   . diazepam (VALIUM) 5 MG tablet   Oral   Take 1 tablet (5 mg total) by mouth every 12 (twelve) hours as needed for muscle spasms.   30 tablet   0   . Fluticasone-Salmeterol (ADVAIR DISKUS) 100-50 MCG/DOSE AEPB   Inhalation   Inhale 1 puff into the lungs 2 (two) times daily.   60 each   0   . furosemide (LASIX) 20 MG tablet   Oral   Take 2 tablets (40 mg total) by mouth 2 (two) times daily.   60 tablet   0   . metoprolol succinate (TOPROL XL) 25 MG 24 hr tablet   Oral   Take 1 tablet (25 mg total) by mouth daily.   30 tablet  0   . omeprazole (PRILOSEC) 20 MG capsule   Oral   Take 20 mg by mouth 2 (two) times daily before a meal.         . oxyCODONE (OXY IR/ROXICODONE) 5 MG immediate release tablet   Oral   Take 1 tablet (5 mg total) by mouth every 6 (six) hours as needed for severe pain.   40 tablet   0   . oxyCODONE-acetaminophen (ROXICET) 5-325 MG per tablet   Oral   Take 1 tablet by mouth every 6 (six) hours as needed for severe pain.   12 tablet   0   . sulfamethoxazole-trimethoprim (BACTRIM DS) 800-160 MG per tablet   Oral   Take 1 tablet by mouth 2 (two) times daily.   14 tablet   0   . tiotropium (SPIRIVA HANDIHALER) 18 MCG inhalation capsule   Inhalation   Place 1 capsule (18 mcg total) into inhaler and inhale daily.   30 capsule   0     Allergies Lisinopril  Family  History  Problem Relation Age of Onset  . Heart attack Mother   . Cancer Father   . COPD Father     Social History Social History  Substance Use Topics  . Smoking status: Current Every Day Smoker    Types: Cigarettes  . Smokeless tobacco: None  . Alcohol Use: No    Review of Systems Constitutional: No fever/chills Eyes: No visual changes. ENT: No sore throat. No stiff neck no neck pain Cardiovascular: Denies chest pain. Respiratory: shortness of breath. Gastrointestinal:   no vomiting.  No diarrhea.  No constipation. Genitourinary: Negative for dysuria. Musculoskeletal: Negative lower extremity swelling Skin: Negative for rash. Neurological: Negative for headaches, focal weakness or numbness. 10-point ROS otherwise negative.  ____________________________________________   PHYSICAL EXAM:  VITAL SIGNS: ED Triage Vitals  Enc Vitals Group     BP 06/12/15 1132 161/96 mmHg     Pulse Rate 06/12/15 1132 117     Resp 06/12/15 1132 32     Temp --      Temp Source 06/12/15 1132 Oral     SpO2 06/12/15 1132 90 %     Weight 06/12/15 1132 181 lb (82.101 kg)     Height 06/12/15 1132 6\' 2"  (1.88 m)     Head Cir --      Peak Flow --      Pain Score 06/12/15 1133 9     Pain Loc --      Pain Edu? --      Excl. in [place]? --     Constitutional: Alert and oriented. Patient is initially tripoding, tachypnea, with 4 word dyspnea, smells of tobacco smoke Eyes: Conjunctivae are normal. PERRL. EOMI. Head: Atraumatic. Nose: No congestion/rhinnorhea. Mouth/Throat: Mucous membranes are moist.  Oropharynx non-erythematous. Neck: No stridor.   Nontender with no meningismus Cardiovascular: Tachycardia noted. Grossly normal heart sounds.  Good peripheral circulation. Respiratory: Increased respiratory rate, left sided wheezes noted, right-sided wheezes noted the upper fields, diminished in the right base nasal rhonchi noted. Gastrointestinal: Soft and nontender. No distention. No guarding no  rebound Back:  There is no focal tenderness or step off there is no midline tenderness there are no lesions noted. there is no CVA tenderness Musculoskeletal: No lower extremity tenderness. No joint effusions, no DVT signs strong distal pulses no edema Neurologic:  Normal speech and language. No gross focal neurologic deficits are appreciated.  Skin:  Skin is warm, dry and intact. No rash  noted. Psychiatric: Mood and affect are normal. Speech and behavior are normal.  ____________________________________________   LABS (all labs ordered are listed, but only abnormal results are displayed)  Labs Reviewed  PROTIME-INR - Abnormal; Notable for the following:    Prothrombin Time 15.1 (*)    All other components within normal limits  CULTURE, BLOOD (ROUTINE X 2)  CULTURE, BLOOD (ROUTINE X 2)  URINE CULTURE  CBC WITH DIFFERENTIAL/PLATELET  TROPONIN I  URINALYSIS COMPLETEWITH MICROSCOPIC ([facility] ONLY)  BASIC METABOLIC PANEL  HEPATIC FUNCTION PANEL  LIPASE, BLOOD  BRAIN NATRIURETIC PEPTIDE   ____________________________________________  EKG  Sinus tach noted EKG rate 113 normal axis, no acute ischemic changes I personally reviewed this ____________________________________________  RADIOLOGY  Chest x-ray reviewed by me ____________________________________________   PROCEDURES  Procedure(s) performed: None  Critical Care performed: CRITICAL CARE Performed by: Schuyler Amor   Total critical care time: 40 minutes  Critical care time was exclusive of separately billable procedures and treating other patients.  Critical care was necessary to treat or prevent imminent or life-threatening deterioration.  Critical care was time spent personally by me on the following activities: development of treatment plan with patient and/or surrogate as well as nursing, discussions with consultants, evaluation of patient's response to treatment, examination of patient, obtaining history from  patient or surrogate, ordering and performing treatments and interventions, ordering and review of laboratory studies, ordering and review of radiographic studies, pulse oximetry and re-evaluation of patient's condition.   ____________________________________________   INITIAL IMPRESSION / ASSESSMENT AND PLAN / ED COURSE  Pertinent labs & imaging results that were available during my care of the patient were reviewed by me and considered in my medical decision making (see chart for details).  Patient presents with significant dyspnea, history of COPD and mild CHF with home oxygen requirement and right-sided pleural effusion at baseline. Patient acutely ill. I did place him immediately on BiPAP, he feels much better although he is complaining that the BiPAP is uncomfortable for his gums. His work of breathing is greatly improved at this time, we have given him continuous nebs via BiPAP. I've given him steroids, chest x-ray is pending. Awaiting blood work and we'll continue to evaluate. No evidence of acute ischemia. Consistent with a COPD flare complicated by his pleural effusion.  ----------------------------------------- 12:32 PM on 06/12/2015 -----------------------------------------  Patient is becoming very anxious on the BiPAP even though his respiratory status is greatly improved he states it hurts his gums and he wants me to take it off. I've explained him that is not likely safe to do so, he is therefore requesting something for "anxiety". ____________________________________________   ----------------------------------------- 1:18 PM on 06/12/2015 -----------------------------------------  Patient blood pressure was good but white count is quite elevated concerning for significant infection. I have instituted broad-spectrum miotics after discussion with the hospitalist service. They do not wish any further antibiotics at this time. Patient is able speak in full sentences. He has an  EF of 25% so we are giving him ginger IV fluids.  FINAL CLINICAL IMPRESSION(S) / ED DIAGNOSES  Final diagnoses:  SOB (shortness of breath)     Schuyler Amor, MD 06/12/15 [place], MD 06/12/15 [place], MD 06/12/15 [place], MD 06/12/15 1318

## 2015-06-12 NOTE — Progress Notes (Signed)
ANTIBIOTIC CONSULT NOTE - INITIAL  Pharmacy Consult for Levaquin/Zosyn Indication: pneumonia  Allergies  Allergen Reactions  . Lisinopril Swelling    Pt reports throat swelling.  . Metoprolol     Other reaction(s): Other (See Comments) Caused ringing in ears    Patient Measurements: Height: 6\' 2"  (188 cm) Weight: 181 lb (82.101 kg) IBW/kg (Calculated) : 82.2 Adjusted Body Weight: n/a  Vital Signs: Temp: 97.3 F (36.3 C) (10/01 1536) Temp Source: Axillary (10/01 1536) BP: 124/83 mmHg (10/01 1600) Pulse Rate: 105 (10/01 1618) Intake/Output from previous day:   Intake/Output from this shift:    Labs:  Recent Labs  06/12/15 1147  WBC 27.0*  HGB 13.7  PLT 244  CREATININE 2.29*   Estimated Creatinine Clearance: 33.9 mL/min (by C-G formula based on Cr of 2.29). No results for input(s): VANCOTROUGH, VANCOPEAK, VANCORANDOM, GENTTROUGH, GENTPEAK, GENTRANDOM, TOBRATROUGH, TOBRAPEAK, TOBRARND, AMIKACINPEAK, AMIKACINTROU, AMIKACIN in the last 72 hours.   Microbiology: Recent Results (from the past 720 hour(s))  Urine culture     Status: None   Collection Time: 06/06/15 10:50 AM  Result Value Ref Range Status   Specimen Description URINE, RANDOM  Final   Special Requests NONE  Final   Culture   Final    >=100,000 COLONIES/mL CANDIDA ALBICANS WITH MIXED BACTERIAL ORGANISMS    Report Status 06/09/2015 FINAL  Final    Medical History: Past Medical History  Diagnosis Date  . COPD (chronic obstructive pulmonary disease) ([place])   . Chronic systolic CHF (congestive heart failure) ([place])   . Kidney stone   . Macular degeneration   . HLD (hyperlipidemia)   . Hypertensive retinopathy   . HTN (hypertension)   . GERD (gastroesophageal reflux disease)     Medications:  Anti-infectives    Start     Dose/Rate Route Frequency Ordered Stop   06/13/15 1000  levofloxacin (LEVAQUIN) IVPB 750 mg     750 mg 100 mL/hr over 90 Minutes Intravenous Every 48 hours 06/12/15 1625     06/12/15 1700  piperacillin-tazobactam (ZOSYN) IVPB 3.375 g     3.375 g 12.5 mL/hr over 240 Minutes Intravenous 3 times per day 06/12/15 1625     06/12/15 1245  cefTRIAXone (ROCEPHIN) 1 g in dextrose 5 % 50 mL IVPB     1 g 100 mL/hr over 30 Minutes Intravenous  Once 06/12/15 1238 06/12/15 1349   06/12/15 1245  azithromycin (ZITHROMAX) 500 mg in dextrose 5 % 250 mL IVPB     500 mg 250 mL/hr over 60 Minutes Intravenous  Once 06/12/15 1238 06/12/15 1437     Assessment: Patient admitted for large pleural effusion. Initiated on antibiotics for possible pneumonia. Patient received Azithromycin 500mg  IV once and Ceftriaxone 1g IV once in the ED. Consulted to dose Levaquin and Zosyn.  Goal of Therapy:  Resolution of symptoms  Plan:  Follow up culture results  Will order Zosyn 3.375g IV q8h EI. Will order Levaquin 750mg  IV q48h. Will hold off starting Levaquin until 06/13/15 since patient has already received Azithromycin today to avoid possible QT prolongation.  Paulina Fusi, PharmD, BCPS 06/12/2015 4:30 PM

## 2015-06-13 ENCOUNTER — Inpatient Hospital Stay: Payer: Medicare Other

## 2015-06-13 LAB — BASIC METABOLIC PANEL
ANION GAP: 9 (ref 5–15)
BUN: 45 mg/dL — ABNORMAL HIGH (ref 6–20)
CO2: 22 mmol/L (ref 22–32)
Calcium: 8.6 mg/dL — ABNORMAL LOW (ref 8.9–10.3)
Chloride: 105 mmol/L (ref 101–111)
Creatinine, Ser: 2.23 mg/dL — ABNORMAL HIGH (ref 0.61–1.24)
GFR calc Af Amer: 32 mL/min — ABNORMAL LOW (ref >60)
GFR, EST NON AFRICAN AMERICAN: 28 mL/min — AB (ref >60)
Glucose, Bld: 221 mg/dL — ABNORMAL HIGH (ref 65–99)
POTASSIUM: 4.5 mmol/L (ref 3.5–5.1)
SODIUM: 136 mmol/L (ref 135–145)

## 2015-06-13 LAB — CBC
HEMATOCRIT: 38.9 % — AB (ref 40.0–52.0)
HEMOGLOBIN: 12.1 g/dL — AB (ref 13.0–18.0)
MCH: 24 pg — ABNORMAL LOW (ref 26.0–34.0)
MCHC: 31.2 g/dL — ABNORMAL LOW (ref 32.0–36.0)
MCV: 76.8 fL — ABNORMAL LOW (ref 80.0–100.0)
Platelets: 205 10*3/uL (ref 150–440)
RBC: 5.06 MIL/uL (ref 4.40–5.90)
RDW: 20 % — AB (ref 11.5–14.5)
WBC: 21.6 10*3/uL — AB (ref 3.8–10.6)

## 2015-06-13 MED ORDER — FUROSEMIDE 20 MG PO TABS
20.0000 mg | ORAL_TABLET | Freq: Every day | ORAL | Status: DC
  Administered 2015-06-13 – 2015-06-14 (×2): 20 mg via ORAL
  Filled 2015-06-13 (×2): qty 1

## 2015-06-13 NOTE — Progress Notes (Signed)
[facility] [place] NAME: Collin [name]    MR#:  [id number]  DATE OF BIRTH:  04/04/1975  SUBJECTIVE:  i feel a whole lot better  REVIEW OF SYSTEMS:   Review of Systems  Constitutional: Negative for fever, chills and weight loss.  HENT: Negative for ear discharge, ear pain and nosebleeds.   Eyes: Negative for blurred vision, pain and discharge.  Respiratory: Positive for cough and shortness of breath. Negative for sputum production, wheezing and stridor.   Cardiovascular: Negative for chest pain, palpitations, orthopnea and PND.  Gastrointestinal: Negative for nausea, vomiting, abdominal pain and diarrhea.  Genitourinary: Negative for urgency and frequency.  Musculoskeletal: Negative for back pain and joint pain.  Neurological: Negative for sensory change, speech change, focal weakness and weakness.  Psychiatric/Behavioral: Negative for depression and hallucinations. The patient is not nervous/anxious.    Tolerating Diet:yes Tolerating PT: not needed  DRUG ALLERGIES:   Allergies  Allergen Reactions  . Lisinopril Swelling    Pt reports throat swelling.  . Metoprolol     Other reaction(s): Other (See Comments) Caused ringing in ears    VITALS:  Blood pressure 117/80, pulse 95, temperature 97.9 F (36.6 C), temperature source Oral, resp. rate 18, height 6\' 2"  (1.88 m), weight 77.928 kg (171 lb 12.8 oz), SpO2 97 %.  PHYSICAL EXAMINATION:   Physical Exam  GENERAL:  74 y.o.-year-old patient lying in the bed with no acute distress.  EYES: Pupils equal, round, reactive to light and accommodation. No scleral icterus. Extraocular muscles intact.  HEENT: Head atraumatic, normocephalic. Oropharynx and nasopharynx clear.  NECK:  Supple, no jugular venous distention. No thyroid enlargement, no tenderness.  LUNGS: Distant breath sounds bilaterally, no wheezing,no  Rales, no rhonchi. No use of accessory muscles of respiration.   CARDIOVASCULAR: S1, S2 normal. No murmurs, rubs, or gallops.  ABDOMEN: Soft, nontender, nondistended. Bowel sounds present. No organomegaly or mass.  EXTREMITIES: No cyanosis, clubbing or edema b/l.    NEUROLOGIC: Cranial nerves II through XII are intact. No focal Motor or sensory deficits b/l.   PSYCHIATRIC: The patient is alert and oriented x 3.  SKIN: No obvious rash, lesion, or ulcer.    LABORATORY PANEL:   CBC  Recent Labs Lab 06/13/15 0405  WBC 21.6*  HGB 12.1*  HCT 38.9*  PLT 205    Chemistries   Recent Labs Lab 06/12/15 1147 06/13/15 0405  NA 138 136  K 5.0 4.5  CL 103 105  CO2 24 22  GLUCOSE 133* 221*  BUN 42* 45*  CREATININE 2.29* 2.23*  CALCIUM 9.5 8.6*  AST 21  --   ALT 19  --   ALKPHOS 139*  --   BILITOT 1.0  --     Cardiac Enzymes  Recent Labs Lab 06/12/15 1147  TROPONINI 0.06*    RADIOLOGY:  Dg Chest 1 View  06/12/2015   CLINICAL DATA:  Status post right thoracentesis for pleural effusion.  EXAM: CHEST 1 VIEW  COMPARISON:  06/12/2015 and prior radiograph  FINDINGS: The right pleural effusion has decreased in size, now small to moderate. Right basilar atelectasis is again identified. There is no evidence of pneumothorax.  Cardiomegaly is again noted.  Mild pulmonary vascular congestion is unchanged.  IMPRESSION: Decreased right pleural effusion status post thoracentesis. No evidence of pneumothorax.   Electronically Signed   By: Margarette Canada M.D.   On: 06/12/2015 15:20   Portable Chest 1 View  06/13/2015   CLINICAL  DATA:  Pleural effusion. Short of breath. Thoracentesis yesterday on the right.  EXAM: PORTABLE CHEST 1 VIEW  COMPARISON:  06/12/2015  FINDINGS: Right pleural effusion is unchanged. Right lower lobe atelectasis unchanged. No pneumothorax.  Cardiac enlargement.  Mild vascular congestion without edema.  IMPRESSION: Right pleural effusion and right lower lobe atelectasis unchanged. No pneumothorax post right thoracentesis.   Electronically  Signed   By: Franchot Gallo M.D.   On: 06/13/2015 08:34   Dg Chest Port 1 View  06/12/2015   CLINICAL DATA:  Shortness of breath for 2 days.  Wheezing.  EXAM: PORTABLE CHEST 1 VIEW  COMPARISON:  June 06, 2015  FINDINGS: There remains a moderate to large right pleural effusion with right middle and lower lobe consolidation. Lungs elsewhere clear. Heart is enlarged with pulmonary vascularity within normal limits. No adenopathy. There is atherosclerotic change in the aorta. Incidental note is made of an azygos lobe on the right, an anatomic variant.  IMPRESSION: Fairly sizable pleural effusion on the right with right middle and lower lobe consolidation. Left lung clear. Stable cardiomegaly. No new opacity.   Electronically Signed   By: Lowella Grip III M.D.   On: 06/12/2015 12:18   ASSESSMENT AND PLAN:  74 yr old Mr Stead presents with 2 day history of worsening shortness of breath. Pt barely able to speak in complete sentences so most history is coming from wife. No fever or chills. Pt continues to smoke  1. Acute on Chronic Respiratory Failure: Pt was requiring bipap.  -now on 2 liter . sats good -s/p  US Guided thoracentesis -Pulmonary consult appreciated  2. Pleural Effusion: Large right sided. Pt has had this tapped multiple times, each time has shown transudate which may be sec to CHF.  -s/p thoracentisis. With removal of about 3 liters of fluid  3. Pneumonia: High white count and subjective fever.  -no fever -on broad spectrum abxs given infiltrate in right LL -cont zosyn+ levaquin for and and now down in am if cont to show improvement  4. CHF: Suspect this is contributing to resp failure. -change to po lasix  5. Stage 4 CRF: Cr up from recent. May be secondary to poor perfusion. He has a history of being dialysed last Dec.   6. COPD: No wheezing. Continue home meds. No need for steriods at this point.  7. Tobacco abuse Smoking cessation advised ~4 mins spent  8. Left  side 7 mm lung nodule Defer to pulmonary Dr Raul Del for out pt w/u  Case discussed with Care Management/Social Worker. Management plans discussed with the patient and in agreement.  CODE STATUS: full  DVT Prophylaxis: Lovenox  TOTAL TIME TAKING CARE OF THIS PATIENT: 35 minutes.  >50% time spent on counselling and coordination of care  POSSIBLE D/C IN 1-23DAYS, DEPENDING ON CLINICAL CONDITION.   Demaris Leavell M.D on 06/13/2015 at 10:38 AM  Between 7am to 6pm - Pager - [phone]  After 6pm go to [email or web address] - password [email or web address] [facility]  Office  [phone]  CC: Primary care physician; [name], MD

## 2015-06-13 NOTE — Care Management Note (Addendum)
Case Management Note  Patient Details  Name: Collin [name] MRN: [id number] Date of Birth: 1940/11/05  Subjective/Objective:  74yo Mr Collin [name] was admitted 06/12/15 per pneumonia, right side pleural effusion. Received a thoracentesis with removal of 3 L fluid. HX; COPD, CHF. Chronic 2 L N/C supplied by [facility] [facility]. [facility] to be his home health provider because he has used them in the past. Has a motorized wheelchair, cane, raised toilet seat, and a shower chair. PCP=Dr Linthavong. Pharmacy=[facility] in [place]. Resides with his wife who also provides transportation to appointments. Anticipate home with home health. Case management will follow for discharge  planning.  If not already on COPD GOLD program he is eligible per 3 admissions within the last 6 months. Request for COPD GOLD verbalized by this Probation officer to [place], Therapist, sports.                  Action/Plan:   Expected Discharge Date:                  Expected Discharge Plan:     In-House Referral:     Discharge planning Services     Post Acute Care Choice:    Choice offered to:     DME Arranged:    DME Agency:     HH Arranged:    [place] Agency:     Status of Service:     Medicare Important Message Given:    Date Medicare IM Given:    Medicare IM give by:    Date Additional Medicare IM Given:    Additional Medicare Important Message give by:     If discussed at [place] of Stay Meetings, dates discussed:    Additional Comments:  Aleene Swanner A, RN 06/13/2015, 12:27 PM

## 2015-06-13 NOTE — Progress Notes (Signed)
-  Patient noted to be stable.  -Patient should follow up with Dr. Raul Del for further follow-up of lung nodule and pleural effusions.  Pulmonary service will sign off for now. Please call should there be any questions or concerns.

## 2015-06-14 LAB — CBC
HEMATOCRIT: 37.4 % — AB (ref 40.0–52.0)
HEMOGLOBIN: 11.8 g/dL — AB (ref 13.0–18.0)
MCH: 24.2 pg — AB (ref 26.0–34.0)
MCHC: 31.6 g/dL — ABNORMAL LOW (ref 32.0–36.0)
MCV: 76.6 fL — AB (ref 80.0–100.0)
PLATELETS: 205 10*3/uL (ref 150–440)
RBC: 4.88 MIL/uL (ref 4.40–5.90)
RDW: 20 % — ABNORMAL HIGH (ref 11.5–14.5)
WBC: 18.2 10*3/uL — AB (ref 3.8–10.6)

## 2015-06-14 LAB — URINE CULTURE: Culture: NO GROWTH

## 2015-06-14 MED ORDER — ENOXAPARIN SODIUM 30 MG/0.3ML ~~LOC~~ SOLN: 30.0000 mg | SUBCUTANEOUS | Status: DC

## 2015-06-14 MED ORDER — LEVOFLOXACIN 500 MG PO TABS: 750.0000 mg | ORAL_TABLET | ORAL | Status: DC

## 2015-06-14 MED ORDER — FUROSEMIDE 20 MG PO TABS: 20.0000 mg | ORAL_TABLET | Freq: Every day | ORAL | Status: DC

## 2015-06-14 MED ORDER — OXYCODONE HCL 5 MG PO TABS: 5.0000 mg | ORAL_TABLET | Freq: Four times a day (QID) | ORAL | Status: DC | PRN

## 2015-06-14 MED ORDER — DIAZEPAM 5 MG PO TABS: 5.0000 mg | ORAL_TABLET | Freq: Two times a day (BID) | ORAL | Status: DC | PRN

## 2015-06-14 MED ORDER — LEVOFLOXACIN 750 MG PO TABS: 750.0000 mg | ORAL_TABLET | ORAL | Status: DC

## 2015-06-14 MED ORDER — HYDROCOD POLST-CPM POLST ER 10-8 MG/5ML PO SUER
5.0000 mL | Freq: Two times a day (BID) | ORAL | Status: DC | PRN
  Administered 2015-06-14: 5 mL via ORAL
  Filled 2015-06-14: qty 5

## 2015-06-14 NOTE — Progress Notes (Signed)
Pt discharged home . Scripts at [employer] [facility] . Verbalized correct med adm. Left via w/c. Wife had portable o2 in car

## 2015-06-14 NOTE — Progress Notes (Signed)
Made an initial appointment at the [facility] on July 02, 2015 at 9:00am. Thank you.

## 2015-06-14 NOTE — Discharge Summary (Signed)
[facility] [place] NAME: Collin [name]    Collin#:  [id number]  DATE OF BIRTH:  1941-06-02  DATE OF ADMISSION:  06/12/2015 ADMITTING PHYSICIAN: Collin Hire, MD  DATE OF DISCHARGE: 06/14/2015  PRIMARY CARE PHYSICIAN: Collin Body, MD    ADMISSION DIAGNOSIS:  Acute respiratory failure (HCC) [J96.00] Respiratory distress [R06.00] SOB (shortness of breath) [R06.02] Pneumothorax after biopsy [J95.811] Aspiration pneumonia of right lower lobe, unspecified aspiration pneumonia type ([place]) [J69.0] Chronic obstructive pulmonary disease, unspecified COPD type ([place]) [J44.9]  DISCHARGE DIAGNOSIS:  *Acute on chronic respiratory failure secondary to recurrent right-sided pleural effusion with congestive heart failure acute on chronic systolic and possible underlying right lower lobe pneumonia. *Recurrent right pleural effusion status post thoracentesis *Chronic COPD on home oxygen  SECONDARY DIAGNOSIS:   Past Medical History  Diagnosis Date  . COPD (chronic obstructive pulmonary disease) ([place])   . Chronic systolic CHF (congestive heart failure) ([place])   . Kidney stone   . Macular degeneration   . HLD (hyperlipidemia)   . Hypertensive retinopathy   . HTN (hypertension)   . GERD (gastroesophageal reflux disease)     HOSPITAL COURSE:  74 yr old Collin [name] presents with 2 day history of worsening shortness of breath. Pt barely able to speak in complete sentences so most history is coming from wife. No fever or chills. Pt continues to smoke  1. Acute on Chronic Respiratory Failure: Pt was requiring bipap.  -now on 2 liter [place]. sats good -s/p US Guided thoracentesis -Pulmonary consult appreciated patient is at baseline. He is on 2 L nasal canal oxygen sats are doing well he is feeling overall better.  2. Pleural Effusion: Large right sided. Pt has had this tapped multiple times, each time has shown transudate which may be sec to CHF.   -s/p thoracentisis. With removal of about 3 liters of fluid on 06/12/2015  3. Pneumonia: High white count and subjective fever.  -no fever -on broad spectrum abxs given infiltrate in right LL -cont zosyn+ levaquin for and and now down in am if cont to show improvement -We'll switch patient to by mouth Levaquin. White count trending down white count of discharge is 18,000. He is afebrile.  4. CHF: Suspect this is contributing to resp failure. -change to po lasix -Advised to follow-up with Dr. Ubaldo [name]   5. Stage 4 CRF: Cr up from recent. May be secondary to poor perfusion. He has a history of being dialysed last Dec.  Patient will follow up with Dr. Orpha [name] as outpatient  6. COPD: No wheezing. Continue home meds. No need for steriods at this point.  7. Tobacco abuse Smoking cessation advised ~4 mins spent  8. Left side 7 mm lung nodule Defer to pulmonary Dr Collin [name] for out pt w/u  Overall better. D/chome CONSULTS OBTAINED:     DRUG ALLERGIES:   Allergies  Allergen Reactions  . Lisinopril Swelling    Pt reports throat swelling.  . Metoprolol     Other reaction(s): Other (See Comments) Caused ringing in ears    DISCHARGE MEDICATIONS:   Current Discharge Medication List    START taking these medications   Details  !! furosemide (LASIX) 20 MG tablet Take 1 tablet (20 mg total) by mouth daily. Qty: 30 tablet, Refills: 1    levofloxacin (LEVAQUIN) 750 MG tablet Take 1 tablet (750 mg total) by mouth every other day. Qty: 4 tablet, Refills: 0     !! - Potential  duplicate medications found. Please discuss with provider.    CONTINUE these medications which have CHANGED   Details  diazepam (VALIUM) 5 MG tablet Take 1 tablet (5 mg total) by mouth every 12 (twelve) hours as needed for muscle spasms. Qty: 30 tablet, Refills: 0    oxyCODONE (OXY IR/ROXICODONE) 5 MG immediate release tablet Take 1 tablet (5 mg total) by mouth every 6 (six) hours as needed for severe pain. Qty:  30 tablet, Refills: 0      CONTINUE these medications which have NOT CHANGED   Details  albuterol (PROVENTIL HFA;VENTOLIN HFA) 108 (90 BASE) MCG/ACT inhaler Inhale 2 puffs into the lungs every 6 (six) hours as needed for wheezing or shortness of breath.     aspirin 81 MG tablet Take 81 mg by mouth daily as needed for pain.     fluconazole (DIFLUCAN) 100 MG tablet Take 100 mg by mouth daily. X 14 days.    Fluticasone-Salmeterol (ADVAIR DISKUS) 100-50 MCG/DOSE AEPB Inhale 1 puff into the lungs 2 (two) times daily. Qty: 60 each, Refills: 0    !! furosemide (LASIX) 20 MG tablet Take 2 tablets (40 mg total) by mouth 2 (two) times daily. Qty: 60 tablet, Refills: 0    Multiple Vitamin (MULTIVITAMIN) tablet Take 1 tablet by mouth daily.    nitroGLYCERIN (NITROSTAT) 0.4 MG SL tablet Take 0.4 mg by mouth every 5 (five) minutes x 3 doses as needed. For chest pain. If no relief call MD or go to emergency room.    Omega-3 1000 MG CAPS Take 1,000 mg by mouth daily.    omeprazole (PRILOSEC) 20 MG capsule Take 20 mg by mouth 2 (two) times daily before a meal.    tiotropium (SPIRIVA HANDIHALER) 18 MCG inhalation capsule Place 1 capsule (18 mcg total) into inhaler and inhale daily. Qty: 30 capsule, Refills: 0     !! - Potential duplicate medications found. Please discuss with provider.    STOP taking these medications     oxyCODONE-acetaminophen (ROXICET) 5-325 MG per tablet         If you experience worsening of your admission symptoms, develop shortness of breath, life threatening emergency, suicidal or homicidal thoughts you must seek medical attention immediately by calling 911 or calling your MD immediately  if symptoms less severe.  You Must read complete instructions/literature along with all the possible adverse reactions/side effects for all the Medicines you take and that have been prescribed to you. Take any new Medicines after you have completely understood and accept all the  possible adverse reactions/side effects.   Please note  You were cared for by a hospitalist during your hospital stay. If you have any questions about your discharge medications or the care you received while you were in the hospital after you are discharged, you can call the unit and asked to speak with the hospitalist on call if the hospitalist that took care of you is not available. Once you are discharged, your primary care physician will handle any further medical issues. Please note that NO REFILLS for any discharge medications will be authorized once you are discharged, as it is imperative that you return to your primary care physician (or establish a relationship with a primary care physician if you do not have one) for your aftercare needs so that they can reassess your need for medications and monitor your lab values. Today   SUBJECTIVE    Feels better requesting to go home  VITAL SIGNS:  Blood pressure 119/83,  pulse 88, temperature 97.7 F (36.5 C), temperature source Axillary, resp. rate 18, height 6\' 2"  (1.88 m), weight 77.928 kg (171 lb 12.8 oz), SpO2 96 %.  I/O:   Intake/Output Summary (Last 24 hours) at 06/14/15 1250 Last data filed at 06/14/15 0732  Gross per 24 hour  Intake    293 ml  Output   1375 ml  Net  -1082 ml    PHYSICAL EXAMINATION:  GENERAL:  74 y.o.-year-old patient lying in the bed with no acute distress.  EYES: Pupils equal, round, reactive to light and accommodation. No scleral icterus. Extraocular muscles intact.  HEENT: Head atraumatic, normocephalic. Oropharynx and nasopharynx clear.  NECK:  Supple, no jugular venous distention. No thyroid enlargement, no tenderness.  LUNGS: Distant breath sounds bilaterally, no wheezing, rales,rhonchi or crepitation. No use of accessory muscles of respiration.  CARDIOVASCULAR: S1, S2 normal. No murmurs, rubs, or gallops.  ABDOMEN: Soft, non-tender, non-distended. Bowel sounds present. No organomegaly or mass.   EXTREMITIES: No pedal edema, cyanosis, or clubbing.  NEUROLOGIC: Cranial nerves II through XII are intact. Muscle strength 5/5 in all extremities. Sensation intact. Gait not checked.  PSYCHIATRIC: The patient is alert and oriented x 3.  SKIN: No obvious rash, lesion, or ulcer.   DATA REVIEW:   CBC   Recent Labs Lab 06/14/15 0336  WBC 18.2*  HGB 11.8*  HCT 37.4*  PLT 205    Chemistries   Recent Labs Lab 06/12/15 1147 06/13/15 0405  NA 138 136  K 5.0 4.5  CL 103 105  CO2 24 22  GLUCOSE 133* 221*  BUN 42* 45*  CREATININE 2.29* 2.23*  CALCIUM 9.5 8.6*  AST 21  --   ALT 19  --   ALKPHOS 139*  --   BILITOT 1.0  --     Microbiology Results   Recent Results (from the past 240 hour(s))  Urine culture     Status: None   Collection Time: 06/06/15 10:50 AM  Result Value Ref Range Status   Specimen Description URINE, RANDOM  Final   Special Requests NONE  Final   Culture   Final    >=100,000 COLONIES/mL CANDIDA ALBICANS WITH MIXED BACTERIAL ORGANISMS    Report Status 06/09/2015 FINAL  Final  Culture, blood (routine x 2)     Status: None (Preliminary result)   Collection Time: 06/12/15 11:47 AM  Result Value Ref Range Status   Specimen Description BLOOD LEFT ARM  Final   Special Requests BOTTLES DRAWN AEROBIC AND ANAEROBIC [place]  Final   Culture NO GROWTH 2 DAYS  Final   Report Status PENDING  Incomplete  Urine culture     Status: None   Collection Time: 06/12/15 11:47 AM  Result Value Ref Range Status   Specimen Description URINE, CLEAN CATCH  Final   Special Requests NONE  Final   Culture NO GROWTH  Final   Report Status 06/14/2015 FINAL  Final  Culture, blood (routine x 2)     Status: None (Preliminary result)   Collection Time: 06/12/15 12:04 PM  Result Value Ref Range Status   Specimen Description BLOOD LEFT ARM  Final   Special Requests BOTTLES DRAWN AEROBIC AND ANAEROBIC 5CC  Final   Culture NO GROWTH 2 DAYS  Final   Report Status PENDING  Incomplete   MRSA PCR Screening     Status: None   Collection Time: 06/12/15  3:59 PM  Result Value Ref Range Status   MRSA by PCR NEGATIVE NEGATIVE Final  Comment:        The GeneXpert MRSA Assay (FDA approved for NASAL specimens only), is one component of a comprehensive MRSA colonization surveillance program. It is not intended to diagnose MRSA infection nor to guide or monitor treatment for MRSA infections.     RADIOLOGY:  Dg Chest 1 View  06/12/2015   CLINICAL DATA:  Status post right thoracentesis for pleural effusion.  EXAM: CHEST 1 VIEW  COMPARISON:  06/12/2015 and prior radiograph  FINDINGS: The right pleural effusion has decreased in size, now small to moderate. Right basilar atelectasis is again identified. There is no evidence of pneumothorax.  Cardiomegaly is again noted.  Mild pulmonary vascular congestion is unchanged.  IMPRESSION: Decreased right pleural effusion status post thoracentesis. No evidence of pneumothorax.   Electronically Signed   By: Margarette Canada M.D.   On: 06/12/2015 15:20   Portable Chest 1 View  06/13/2015   CLINICAL DATA:  Pleural effusion. Short of breath. Thoracentesis yesterday on the right.  EXAM: PORTABLE CHEST 1 VIEW  COMPARISON:  06/12/2015  FINDINGS: Right pleural effusion is unchanged. Right lower lobe atelectasis unchanged. No pneumothorax.  Cardiac enlargement.  Mild vascular congestion without edema.  IMPRESSION: Right pleural effusion and right lower lobe atelectasis unchanged. No pneumothorax post right thoracentesis.   Electronically Signed   By: Franchot Gallo M.D.   On: 06/13/2015 08:34     Management plans discussed with the patient, family and they are in agreement.  CODE STATUS:     Code Status Orders        Start     Ordered   06/12/15 1555  Full code   Continuous     06/12/15 1554      TOTAL TIME TAKING CARE OF THIS PATIENT: 40inutes.    Katleen Carraway M.D on 06/14/2015 at 12:50 PM  Between 7am to 6pm - Pager - [phone] After  6pm go to [email or web address] - password [email or web address] [facility]  Office  [phone]  CC: Primary care physician; Collin Body, MD

## 2015-06-14 NOTE — Discharge Instructions (Signed)
Use your oxygen as before Stop smoking!!  [facility] appointment on July 02, 2015 at 9:00am with [name], [place]. Please call [phone] to reschedule.

## 2015-06-14 NOTE — Care Management Note (Addendum)
Case Management Note  Patient Details  Name: Collin [name] MRN: [id number] Date of Birth: September 29, 1940  Subjective/Objective:   Discharged home with home health RN. Already has chronic home oxygen at 2L N/C supplied by [facility]. Referral was called to Floydene Flock at [facility] requesting home health RN. Dr Fritzi Mandes stated that she would enter the home health order for RN into the computer. Floydene Flock notified to look for the order in EPIC.                  Action/Plan:   Expected Discharge Date:                  Expected Discharge Plan:     In-House Referral:     Discharge planning Services     Post Acute Care Choice:    Choice offered to:     DME Arranged:    DME Agency:     HH Arranged:    [place] Agency:     Status of Service:     Medicare Important Message Given:  Yes-second notification given (COPD GOLD candidate) Date Medicare IM Given:    Medicare IM give by:    Date Additional Medicare IM Given:    Additional Medicare Important Message give by:     If discussed at [place] of Stay Meetings, dates discussed:    Additional Comments:  Montavious Wierzba A, RN 06/14/2015, 1:51 PM

## 2015-06-16 DIAGNOSIS — I10 Essential (primary) hypertension: Secondary | ICD-10-CM | POA: Diagnosis not present

## 2015-06-16 DIAGNOSIS — J189 Pneumonia, unspecified organism: Secondary | ICD-10-CM | POA: Diagnosis not present

## 2015-06-17 DIAGNOSIS — E785 Hyperlipidemia, unspecified: Secondary | ICD-10-CM | POA: Diagnosis not present

## 2015-06-17 DIAGNOSIS — N184 Chronic kidney disease, stage 4 (severe): Secondary | ICD-10-CM | POA: Diagnosis not present

## 2015-06-17 DIAGNOSIS — I5022 Chronic systolic (congestive) heart failure: Secondary | ICD-10-CM | POA: Diagnosis not present

## 2015-06-17 DIAGNOSIS — Z720 Tobacco use: Secondary | ICD-10-CM | POA: Diagnosis not present

## 2015-06-17 DIAGNOSIS — J440 Chronic obstructive pulmonary disease with acute lower respiratory infection: Secondary | ICD-10-CM | POA: Diagnosis not present

## 2015-06-17 DIAGNOSIS — I129 Hypertensive chronic kidney disease with stage 1 through stage 4 chronic kidney disease, or unspecified chronic kidney disease: Secondary | ICD-10-CM | POA: Diagnosis not present

## 2015-06-17 DIAGNOSIS — K219 Gastro-esophageal reflux disease without esophagitis: Secondary | ICD-10-CM | POA: Diagnosis not present

## 2015-06-17 DIAGNOSIS — J690 Pneumonitis due to inhalation of food and vomit: Secondary | ICD-10-CM | POA: Diagnosis not present

## 2015-06-17 LAB — CULTURE, BLOOD (ROUTINE X 2)
Culture: NO GROWTH
Culture: NO GROWTH

## 2015-06-21 DIAGNOSIS — K219 Gastro-esophageal reflux disease without esophagitis: Secondary | ICD-10-CM | POA: Diagnosis not present

## 2015-06-21 DIAGNOSIS — I5022 Chronic systolic (congestive) heart failure: Secondary | ICD-10-CM | POA: Diagnosis not present

## 2015-06-21 DIAGNOSIS — J690 Pneumonitis due to inhalation of food and vomit: Secondary | ICD-10-CM | POA: Diagnosis not present

## 2015-06-21 DIAGNOSIS — I129 Hypertensive chronic kidney disease with stage 1 through stage 4 chronic kidney disease, or unspecified chronic kidney disease: Secondary | ICD-10-CM | POA: Diagnosis not present

## 2015-06-21 DIAGNOSIS — N184 Chronic kidney disease, stage 4 (severe): Secondary | ICD-10-CM | POA: Diagnosis not present

## 2015-06-21 DIAGNOSIS — J440 Chronic obstructive pulmonary disease with acute lower respiratory infection: Secondary | ICD-10-CM | POA: Diagnosis not present

## 2015-06-24 DIAGNOSIS — R0609 Other forms of dyspnea: Secondary | ICD-10-CM | POA: Diagnosis not present

## 2015-06-24 DIAGNOSIS — J439 Emphysema, unspecified: Secondary | ICD-10-CM | POA: Diagnosis not present

## 2015-06-24 DIAGNOSIS — I5023 Acute on chronic systolic (congestive) heart failure: Secondary | ICD-10-CM | POA: Diagnosis not present

## 2015-06-24 DIAGNOSIS — F17200 Nicotine dependence, unspecified, uncomplicated: Secondary | ICD-10-CM | POA: Diagnosis not present

## 2015-06-24 DIAGNOSIS — J90 Pleural effusion, not elsewhere classified: Secondary | ICD-10-CM | POA: Diagnosis not present

## 2015-06-25 DIAGNOSIS — K219 Gastro-esophageal reflux disease without esophagitis: Secondary | ICD-10-CM | POA: Diagnosis not present

## 2015-06-25 DIAGNOSIS — J690 Pneumonitis due to inhalation of food and vomit: Secondary | ICD-10-CM | POA: Diagnosis not present

## 2015-06-25 DIAGNOSIS — N184 Chronic kidney disease, stage 4 (severe): Secondary | ICD-10-CM | POA: Diagnosis not present

## 2015-06-25 DIAGNOSIS — I5022 Chronic systolic (congestive) heart failure: Secondary | ICD-10-CM | POA: Diagnosis not present

## 2015-06-25 DIAGNOSIS — I129 Hypertensive chronic kidney disease with stage 1 through stage 4 chronic kidney disease, or unspecified chronic kidney disease: Secondary | ICD-10-CM | POA: Diagnosis not present

## 2015-06-25 DIAGNOSIS — J440 Chronic obstructive pulmonary disease with acute lower respiratory infection: Secondary | ICD-10-CM | POA: Diagnosis not present

## 2015-06-28 DIAGNOSIS — J42 Unspecified chronic bronchitis: Secondary | ICD-10-CM | POA: Diagnosis not present

## 2015-06-28 DIAGNOSIS — E782 Mixed hyperlipidemia: Secondary | ICD-10-CM | POA: Diagnosis not present

## 2015-06-28 DIAGNOSIS — I10 Essential (primary) hypertension: Secondary | ICD-10-CM | POA: Diagnosis not present

## 2015-06-28 DIAGNOSIS — I5023 Acute on chronic systolic (congestive) heart failure: Secondary | ICD-10-CM | POA: Diagnosis not present

## 2015-06-28 DIAGNOSIS — F419 Anxiety disorder, unspecified: Secondary | ICD-10-CM | POA: Diagnosis not present

## 2015-06-29 DIAGNOSIS — J90 Pleural effusion, not elsewhere classified: Secondary | ICD-10-CM | POA: Diagnosis not present

## 2015-06-29 DIAGNOSIS — R0609 Other forms of dyspnea: Secondary | ICD-10-CM | POA: Diagnosis not present

## 2015-07-02 ENCOUNTER — Ambulatory Visit: Payer: Medicare Other | Admitting: Family

## 2015-07-05 ENCOUNTER — Inpatient Hospital Stay: Payer: Medicaid Other | Attending: Cardiothoracic Surgery | Admitting: Cardiothoracic Surgery

## 2015-07-05 ENCOUNTER — Encounter: Payer: Self-pay | Admitting: Cardiothoracic Surgery

## 2015-07-05 ENCOUNTER — Inpatient Hospital Stay: Admission: RE | Admit: 2015-07-05 | Payer: Medicare Other | Source: Ambulatory Visit

## 2015-07-05 VITALS — BP 125/76 | HR 76 | Temp 97.7°F | Ht 74.0 in | Wt 183.3 lb

## 2015-07-05 DIAGNOSIS — J90 Pleural effusion, not elsewhere classified: Secondary | ICD-10-CM | POA: Insufficient documentation

## 2015-07-05 NOTE — Progress Notes (Signed)
Patient here for referral from Dr. Raul Del secondary to pleural effusion.

## 2015-07-05 NOTE — Progress Notes (Signed)
Patient ID: Collin [name], male   DOB: Apr 08, 1941, 74 y.o.   MRN: [id number]  Chief Complaint  Patient presents with  . Pleural Effusion    referral from Dr. Vella Kohler    Referred he states that he is currently having increasing shortness of breath with some discomfort when lying flat. He's also had a significant weight loss of approximately 70 pounds over the last 2 months. By Dr. Raul Del Reason for Referral recurrent right pleural effusion  HPI Location, Quality, Duration, Severity, Timing, Context, Modifying Factors, Associated Signs and Symptoms.  Collin [name] is a 74 y.o. male.  This is a 74 year old gentleman who has had increasing shortness of breath secondary to a recurrent right pleural effusion. At the end of last year he had a relatively normal chest x-ray but over the ensuing year has had multiple thoracenteses for recurrent right pleural effusion. These were transiently dated the nature. He's been extensively evaluated by Dr. Raul Del and Dr. 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 An ejection fraction of 25% was noted on an echocardiogram. The CT scan which the patient had performed earlier this year did reveal a right-sided pleural effusion but did also show some mediastinal adenopathy. The patient is a lifelong smoker and continues to smoke. In addition he has a history of chronic renal insufficiency and has a stent in his right kidney. This was secondary to an obstructive nephropathy.   Past Medical History  Diagnosis Date  . COPD (chronic obstructive pulmonary disease) ([place])   . Chronic systolic CHF (congestive heart failure) ([place])   . Kidney stone   . Macular degeneration   . HLD (hyperlipidemia)   . Hypertensive retinopathy   . HTN (hypertension)   . GERD (gastroesophageal reflux disease)     Past Surgical History  Procedure Laterality Date  . Abdominal aortic aneurysm repair    . Hernia repair    . Kidney  stone surgery      Family History  Problem Relation Age of Onset  . Heart attack Mother   . Stroke Mother   . Cancer Father   . COPD Father     Social History Social History  Substance Use Topics  . Smoking status: Current Every Day Smoker    Types: Cigarettes  . Smokeless tobacco: None  . Alcohol Use: No    Allergies  Allergen Reactions  . Lisinopril Swelling    Pt reports throat swelling.  . Metoprolol     Other reaction(s): Other (See Comments) Caused ringing in ears    Current Outpatient Prescriptions  Medication Sig Dispense Refill  . albuterol (PROVENTIL HFA;VENTOLIN HFA) 108 (90 BASE) MCG/ACT inhaler Inhale 2 puffs into the lungs every 6 (six) hours as needed for wheezing or shortness of breath.     . ALPRAZolam (XANAX) 0.25 MG tablet Take by mouth.    Marland Kitchen aspirin 81 MG tablet Take 81 mg by mouth daily as needed for pain.     . carvedilol (COREG) 6.25 MG tablet Take by mouth.    . fluconazole (DIFLUCAN) 100 MG tablet Take 100 mg by mouth daily. X 14 days.    . furosemide (LASIX) 20 MG tablet Take 2 tablets (40 mg total) by mouth 2 (two) times daily. (Patient taking differently: Take 20 mg by mouth 2 (two) times daily. ) 60 tablet 0  . furosemide (LASIX) 20 MG tablet Take 1 tablet (20 mg total) by mouth daily. 30 tablet 1  .  Multiple Vitamin (MULTIVITAMIN) tablet Take 1 tablet by mouth daily.    . nitroGLYCERIN (NITROSTAT) 0.4 MG SL tablet Take 0.4 mg by mouth every 5 (five) minutes x 3 doses as needed. For chest pain. If no relief call MD or go to emergency room.    . Omega-3 1000 MG CAPS Take 1,000 mg by mouth daily.    Marland Kitchen omeprazole (PRILOSEC) 20 MG capsule Take 20 mg by mouth 2 (two) times daily before a meal.    . diazepam (VALIUM) 5 MG tablet Take 1 tablet (5 mg total) by mouth every 12 (twelve) hours as needed for muscle spasms. (Patient not taking: Reported on 07/05/2015) 30 tablet 0  . tiotropium (SPIRIVA HANDIHALER) 18 MCG inhalation capsule Place 1 capsule (18  mcg total) into inhaler and inhale daily. 30 capsule 0   No current facility-administered medications for this visit.      Review of Systems A complete review of systems was asked and was negative except for the following positive findings 70 pound weight loss, difficulty with vision, palpitations, shortness of breath, swelling of his legs, cough, wheezing, heartburn, frequent urination, painful urination, joint pain, weakness, anxiety, easy bruising, excessive thirst.  Blood pressure 125/76, pulse 76, temperature 97.7 F (36.5 C), height 6\' 2"  (1.88 m), weight 183 lb 5 oz (83.15 kg), SpO2 93 %.  Physical Exam CONSTITUTIONAL:  Pleasant, well-developed, well-nourished, and in no acute distress. EYES: Pupils equal and reactive to light, Sclera non-icteric EARS, NOSE, MOUTH AND THROAT:  The oropharynx was clear.  Dentition is good repair.  Oral mucosa pink and moist. LYMPH NODES:  Lymph nodes in the neck and axillae were normal RESPIRATORY:  Lungs were clear on the left but markedly diminished at the right base posteriorly.  Normal respiratory effort without pathologic use of accessory muscles of respiration CARDIOVASCULAR: Heart was regular without murmurs.  There were no carotid bruits. GI: The abdomen was soft, nontender, and nondistended. There were no palpable masses. There was no splenomegaly but the liver was palpable just below the right costal margin. There were normal bowel sounds in all quadrants. GU:  Rectal deferred.   MUSCULOSKELETAL:  Normal muscle strength and tone.  No clubbing or cyanosis.   SKIN:  There were no pathologic skin lesions.  There were no nodules on palpation. NEUROLOGIC:  Sensation is normal.  Cranial nerves are grossly intact. PSYCH:  Oriented to person, place and time.  Mood and affect are normal.  Data Reviewed Chest x-rays and CT scans  I have personally reviewed the patient's imaging, laboratory findings and medical records.    Assessment    I have  independently reviewed the patient's chest x-rays and CTs. There is a recurrent right pleural effusion. There is some mediastinal adenopathy noted on the chest CT but no obvious lung mass. A review the results of his thoracentesis suggest that this is transudate of and most likely secondary to heart disease.    Plan    I had a long discussion with the patient and his wife. We reviewed the options for recurrent pleural effusions. I told him that the current recommendation was for thoracoscopy with pleural biopsy and talc pleurodesis. However given his extensive comorbid conditions including renal insufficiency pulmonary insufficiency and cardiomyopathy felt that he was at high risk for this. Alternatives were discussed including chest tube insertion with bedside talc slurry. We also discussed the option of putting in a Pleurx catheter. The insertion of a Pleurx would not require general anesthesia and was  certainly be better tolerated. I reviewed with the patient and his wife the indications and risks as well as the alternatives for Pleurx catheter insertion. I explained to them in detail how this would be managed. After an extensive discussion agreed to undergo Pleurx catheter insertion for his right-sided pleural effusion. We will have the patient see Dr. Jola Baptist in preop and we will also ask our anesthesia colleagues to see the patient as well. Routine laboratory studies were ordered today.       Nestor Lewandowsky, MD 07/05/2015, 4:29 PM

## 2015-07-06 ENCOUNTER — Encounter
Admission: RE | Admit: 2015-07-06 | Discharge: 2015-07-06 | Disposition: A | Discharge status: Home / Self Care | Payer: Medicare Other | Source: Ambulatory Visit | Attending: Cardiothoracic Surgery | Admitting: Cardiothoracic Surgery

## 2015-07-06 ENCOUNTER — Ambulatory Visit
Admission: RE | Admit: 2015-07-06 | Discharge: 2015-07-06 | Disposition: A | Discharge status: Home / Self Care | Payer: Medicare Other | Source: Ambulatory Visit | Attending: Cardiothoracic Surgery | Admitting: Cardiothoracic Surgery

## 2015-07-06 DIAGNOSIS — Z01818 Encounter for other preprocedural examination: Secondary | ICD-10-CM

## 2015-07-06 DIAGNOSIS — I509 Heart failure, unspecified: Secondary | ICD-10-CM | POA: Diagnosis not present

## 2015-07-06 DIAGNOSIS — J189 Pneumonia, unspecified organism: Secondary | ICD-10-CM | POA: Diagnosis not present

## 2015-07-06 LAB — COMPREHENSIVE METABOLIC PANEL
ALT: 15 U/L — ABNORMAL LOW (ref 17–63)
ANION GAP: 11 (ref 5–15)
AST: 23 U/L (ref 15–41)
Albumin: 4.1 g/dL (ref 3.5–5.0)
Alkaline Phosphatase: 133 U/L — ABNORMAL HIGH (ref 38–126)
BUN: 28 mg/dL — ABNORMAL HIGH (ref 6–20)
CHLORIDE: 108 mmol/L (ref 101–111)
CO2: 24 mmol/L (ref 22–32)
Calcium: 9.3 mg/dL (ref 8.9–10.3)
Creatinine, Ser: 1.54 mg/dL — ABNORMAL HIGH (ref 0.61–1.24)
GFR, EST AFRICAN AMERICAN: 50 mL/min — AB (ref >60)
GFR, EST NON AFRICAN AMERICAN: 43 mL/min — AB (ref >60)
Glucose, Bld: 101 mg/dL — ABNORMAL HIGH (ref 65–99)
POTASSIUM: 4.3 mmol/L (ref 3.5–5.1)
Sodium: 143 mmol/L (ref 135–145)
Total Bilirubin: 1 mg/dL (ref 0.3–1.2)
Total Protein: 7.2 g/dL (ref 6.5–8.1)

## 2015-07-06 LAB — CBC
HCT: 46 % (ref 40.0–52.0)
Hemoglobin: 14.1 g/dL (ref 13.0–18.0)
MCH: 23.5 pg — AB (ref 26.0–34.0)
MCHC: 30.7 g/dL — ABNORMAL LOW (ref 32.0–36.0)
MCV: 76.6 fL — AB (ref 80.0–100.0)
PLATELETS: 192 10*3/uL (ref 150–440)
RBC: 6 MIL/uL — ABNORMAL HIGH (ref 4.40–5.90)
RDW: 20.7 % — AB (ref 11.5–14.5)
WBC: 9.7 10*3/uL (ref 3.8–10.6)

## 2015-07-06 LAB — PROTIME-INR
INR: 1.22
PROTHROMBIN TIME: 15.6 s — AB (ref 11.4–15.0)

## 2015-07-06 LAB — APTT: aPTT: 32 seconds (ref 24–36)

## 2015-07-06 NOTE — Patient Instructions (Addendum)
  Your procedure is scheduled on: 07/07/2015 Report to Day Surgery To find out your arrival time please call [phone] between 1PM - 3PM on 07/06/15  Remember: Instructions that are not followed completely may result in serious medical risk, up to and including death, or upon the discretion of your surgeon and anesthesiologist your surgery may need to be rescheduled.    _x__ 1. Do not eat food or drink liquids after midnight. No gum chewing or hard candies.     __x2. No Alcohol for 24 hours before or after surgery.   ____ 3. Bring all medications with you on the day of surgery if instructed.    __x_ 4. Notify your doctor if there is any change in your medical condition     (cold, fever, infections).     Do not wear jewelry, make-up, hairpins, clips or nail polish.  Do not wear lotions, powders, or perfumes. You may wear deodorant.  Do not shave 48 hours prior to surgery. Men may shave face and neck.  Do not bring valuables to the hospital.    [facility] is not responsible for any belongings or valuables.               Contacts, dentures or bridgework may not be worn into surgery.  Leave your suitcase in the car. After surgery it may be brought to your room.  For patients admitted to the hospital, discharge time is determined by your                treatment team.   Patients discharged the day of surgery will not be allowed to drive home.   Please read over the following fact sheets that you were given:   Surgical Site Infection Prevention   ____ Take these medicines the morning of surgery with A SIP OF WATER:    1. inhalers  2. prilosec  3. Carvedilol  4.Xanax if needed  5. Tylenol  6.  ____ Fleet Enema (as directed)   _x___ Use CHG Soap as directed  ____ Use inhalers on the day of surgery  ____ Stop metformin 2 days prior to surgery    ____ Take 1/2 of usual insulin dose the night before surgery and none on the morning of surgery.   ____ Stop  Coumadin/Plavix/aspirin on asa stopped on 10/24  ____ Stop Anti-inflammatories on    ____ Stop supplements until after surgery.    ____ Bring C-Pap to the hospital.

## 2015-07-06 NOTE — OR Nursing (Signed)
Dr. Oren Section notified of needed consult.  Requests note of clearance from Dr. Raul Del (pulmonologist) and Dr. Ubaldo Glassing (cardiology)

## 2015-07-06 NOTE — OR Nursing (Signed)
Cleared by Dr Valda Lamb telephone encounter in [place] epic. Referred by Dr Raul Del to Dr Faith Rogue

## 2015-07-06 NOTE — Pre-Procedure Instructions (Deleted)
Will call Dr. Ubaldo Glassing for clearance.  Chart reviewed by anesthesia

## 2015-07-06 NOTE — OR Nursing (Signed)
Dr. Raul Del and Dr. Bethanne Ginger office notified of need for clearance note for surgery tomorrow

## 2015-07-06 NOTE — OR Nursing (Signed)
Maud visited with patient for preop interview.

## 2015-07-07 ENCOUNTER — Inpatient Hospital Stay: Payer: Medicare Other

## 2015-07-07 ENCOUNTER — Inpatient Hospital Stay
Admission: RE | Admit: 2015-07-07 | Discharge: 2015-07-08 | DRG: 293 | Disposition: A | Discharge status: Home Health | Payer: Medicare Other | Source: Ambulatory Visit | Attending: Cardiothoracic Surgery | Admitting: Cardiothoracic Surgery

## 2015-07-07 ENCOUNTER — Encounter
Admission: RE | Disposition: A | Discharge status: Home Health | Payer: Self-pay | Source: Ambulatory Visit | Attending: Cardiothoracic Surgery

## 2015-07-07 ENCOUNTER — Encounter: Payer: Self-pay | Admitting: *Deleted

## 2015-07-07 DIAGNOSIS — H3530 Unspecified macular degeneration: Secondary | ICD-10-CM | POA: Diagnosis present

## 2015-07-07 DIAGNOSIS — J90 Pleural effusion, not elsewhere classified: Secondary | ICD-10-CM | POA: Diagnosis not present

## 2015-07-07 DIAGNOSIS — K219 Gastro-esophageal reflux disease without esophagitis: Secondary | ICD-10-CM | POA: Diagnosis present

## 2015-07-07 DIAGNOSIS — F17210 Nicotine dependence, cigarettes, uncomplicated: Secondary | ICD-10-CM | POA: Diagnosis present

## 2015-07-07 DIAGNOSIS — J449 Chronic obstructive pulmonary disease, unspecified: Secondary | ICD-10-CM | POA: Diagnosis present

## 2015-07-07 DIAGNOSIS — Z7982 Long term (current) use of aspirin: Secondary | ICD-10-CM

## 2015-07-07 DIAGNOSIS — Z09 Encounter for follow-up examination after completed treatment for conditions other than malignant neoplasm: Secondary | ICD-10-CM

## 2015-07-07 DIAGNOSIS — Z4682 Encounter for fitting and adjustment of non-vascular catheter: Secondary | ICD-10-CM | POA: Diagnosis not present

## 2015-07-07 DIAGNOSIS — I110 Hypertensive heart disease with heart failure: Secondary | ICD-10-CM | POA: Diagnosis present

## 2015-07-07 DIAGNOSIS — Z79899 Other long term (current) drug therapy: Secondary | ICD-10-CM | POA: Diagnosis not present

## 2015-07-07 DIAGNOSIS — I5022 Chronic systolic (congestive) heart failure: Secondary | ICD-10-CM | POA: Diagnosis present

## 2015-07-07 DIAGNOSIS — Z888 Allergy status to other drugs, medicaments and biological substances status: Secondary | ICD-10-CM | POA: Diagnosis not present

## 2015-07-07 HISTORY — PX: CHEST TUBE INSERTION: SHX231

## 2015-07-07 SURGERY — INSERTION, PLEURAL DRAINAGE CATHETER
Anesthesia: Monitor Anesthesia Care | Laterality: Right | Wound class: Clean Contaminated

## 2015-07-07 MED ORDER — FUROSEMIDE 20 MG PO TABS
20.0000 mg | ORAL_TABLET | Freq: Every day | ORAL | Status: DC
  Administered 2015-07-07: 20 mg via ORAL
  Filled 2015-07-07 (×2): qty 1

## 2015-07-07 MED ORDER — DEXTROSE-NACL 5-0.45 % IV SOLN
INTRAVENOUS | Status: DC
  Administered 2015-07-07 – 2015-07-08 (×2): via INTRAVENOUS

## 2015-07-07 MED ORDER — BISACODYL 5 MG PO TBEC
10.0000 mg | DELAYED_RELEASE_TABLET | Freq: Every day | ORAL | Status: DC
  Administered 2015-07-07 – 2015-07-08 (×2): 10 mg via ORAL
  Filled 2015-07-07 (×2): qty 2

## 2015-07-07 MED ORDER — FENTANYL CITRATE (PF) 100 MCG/2ML IJ SOLN
INTRAMUSCULAR | Status: AC
  Filled 2015-07-07: qty 2

## 2015-07-07 MED ORDER — ALBUTEROL SULFATE HFA 108 (90 BASE) MCG/ACT IN AERS: 2.0000 | INHALATION_SPRAY | Freq: Four times a day (QID) | RESPIRATORY_TRACT | Status: DC | PRN

## 2015-07-07 MED ORDER — ALBUTEROL SULFATE (2.5 MG/3ML) 0.083% IN NEBU
2.5000 mg | INHALATION_SOLUTION | RESPIRATORY_TRACT | Status: DC
  Administered 2015-07-07 – 2015-07-08 (×3): 2.5 mg via RESPIRATORY_TRACT
  Filled 2015-07-07 (×2): qty 3

## 2015-07-07 MED ORDER — LIDOCAINE HCL (PF) 1 % IJ SOLN
INTRAMUSCULAR | Status: DC | PRN
  Administered 2015-07-07: 14 mL

## 2015-07-07 MED ORDER — FENTANYL CITRATE (PF) 100 MCG/2ML IJ SOLN
INTRAMUSCULAR | Status: AC
Start: 2015-07-07 — End: 2015-07-08
  Filled 2015-07-07: qty 2

## 2015-07-07 MED ORDER — ONDANSETRON HCL 4 MG/2ML IJ SOLN: 4.0000 mg | Freq: Four times a day (QID) | INTRAMUSCULAR | Status: DC | PRN

## 2015-07-07 MED ORDER — MIDAZOLAM HCL 2 MG/2ML IJ SOLN
INTRAMUSCULAR | Status: DC | PRN
  Administered 2015-07-07 (×2): 1 mg via INTRAVENOUS

## 2015-07-07 MED ORDER — CARVEDILOL 12.5 MG PO TABS
6.2500 mg | ORAL_TABLET | Freq: Two times a day (BID) | ORAL | Status: DC
  Administered 2015-07-07 – 2015-07-08 (×2): 6.25 mg via ORAL
  Filled 2015-07-07 (×2): qty 1

## 2015-07-07 MED ORDER — PROPOFOL 500 MG/50ML IV EMUL
INTRAVENOUS | Status: DC | PRN
  Administered 2015-07-07: 25 ug/kg/min via INTRAVENOUS

## 2015-07-07 MED ORDER — TIOTROPIUM BROMIDE MONOHYDRATE 18 MCG IN CAPS
18.0000 ug | ORAL_CAPSULE | Freq: Every day | RESPIRATORY_TRACT | Status: DC
  Administered 2015-07-07 – 2015-07-08 (×2): 18 ug via RESPIRATORY_TRACT
  Filled 2015-07-07: qty 5

## 2015-07-07 MED ORDER — FENTANYL CITRATE (PF) 100 MCG/2ML IJ SOLN
INTRAMUSCULAR | Status: DC | PRN
  Administered 2015-07-07: 25 ug via INTRAVENOUS

## 2015-07-07 MED ORDER — PANTOPRAZOLE SODIUM 40 MG PO TBEC
40.0000 mg | DELAYED_RELEASE_TABLET | Freq: Every day | ORAL | Status: DC
Start: 2015-07-08 — End: 2015-07-08
  Administered 2015-07-08: 40 mg via ORAL
  Filled 2015-07-07: qty 1

## 2015-07-07 MED ORDER — FENTANYL CITRATE (PF) 100 MCG/2ML IJ SOLN: 25.0000 ug | INTRAMUSCULAR | Status: DC | PRN

## 2015-07-07 MED ORDER — DEXTROSE 5 % IV SOLN
1.5000 g | INTRAVENOUS | Status: AC
  Administered 2015-07-07: 1.5 g via INTRAVENOUS
  Filled 2015-07-07: qty 1.5

## 2015-07-07 MED ORDER — FUROSEMIDE 40 MG PO TABS
40.0000 mg | ORAL_TABLET | Freq: Every morning | ORAL | Status: DC
  Administered 2015-07-07 – 2015-07-08 (×2): 40 mg via ORAL
  Filled 2015-07-07 (×2): qty 1

## 2015-07-07 MED ORDER — LACTATED RINGERS IV SOLN
INTRAVENOUS | Status: DC
  Administered 2015-07-07: 12:00:00 via INTRAVENOUS

## 2015-07-07 MED ORDER — OXYCODONE-ACETAMINOPHEN 5-325 MG PO TABS
1.0000 | ORAL_TABLET | ORAL | Status: DC | PRN
  Administered 2015-07-07: 1 via ORAL
  Administered 2015-07-07 – 2015-07-08 (×4): 2 via ORAL
  Filled 2015-07-07 (×5): qty 2

## 2015-07-07 MED ORDER — ALBUTEROL SULFATE (2.5 MG/3ML) 0.083% IN NEBU
2.5000 mg | INHALATION_SOLUTION | RESPIRATORY_TRACT | Status: DC
  Filled 2015-07-07: qty 3

## 2015-07-07 MED ORDER — DEXTROSE 5 % IV SOLN
1.5000 g | Freq: Two times a day (BID) | INTRAVENOUS | Status: DC
  Administered 2015-07-07: 1.5 g via INTRAVENOUS
  Filled 2015-07-07 (×2): qty 1.5

## 2015-07-07 MED ORDER — ONDANSETRON HCL 4 MG/2ML IJ SOLN: 4.0000 mg | Freq: Once | INTRAMUSCULAR | Status: DC | PRN

## 2015-07-07 MED ORDER — ADULT MULTIVITAMIN W/MINERALS CH
1.0000 | ORAL_TABLET | Freq: Every day | ORAL | Status: DC
  Administered 2015-07-07 – 2015-07-08 (×2): 1 via ORAL
  Filled 2015-07-07 (×2): qty 1

## 2015-07-07 MED ORDER — DEXAMETHASONE SODIUM PHOSPHATE 4 MG/ML IJ SOLN
8.0000 mg | Freq: Once | INTRAMUSCULAR | Status: DC | PRN
  Filled 2015-07-07: qty 2

## 2015-07-07 MED ORDER — FENTANYL CITRATE (PF) 100 MCG/2ML IJ SOLN
25.0000 ug | INTRAMUSCULAR | Status: AC | PRN
  Administered 2015-07-07 (×6): 25 ug via INTRAVENOUS

## 2015-07-07 SURGICAL SUPPLY — 28 items
CANISTER SUCT 1200ML W/VALVE (MISCELLANEOUS) ×2 IMPLANT
CHLORAPREP W/TINT 26ML (MISCELLANEOUS) ×2 IMPLANT
DRAIN CHEST DRY SUCT SGL (MISCELLANEOUS) ×6 IMPLANT
DRAPE INCISE IOBAN 66X45 STRL (DRAPES) ×2 IMPLANT
DRAPE PED LAPAROTOMY (DRAPES) ×2 IMPLANT
GLOVE EXAM LX STRL 7.5 (GLOVE) ×2 IMPLANT
GOWN STRL REUS W/ TWL LRG LVL3 (GOWN DISPOSABLE) ×2 IMPLANT
GOWN STRL REUS W/TWL LRG LVL3 (GOWN DISPOSABLE) ×2
KIT PLEURX DRAIN CATH 500ML (KITS) ×2 IMPLANT
KIT RM TURNOVER STRD PROC AR (KITS) ×2 IMPLANT
LABEL OR SOLS (LABEL) ×2 IMPLANT
MARKER SKIN W/RULER 31145785 (MISCELLANEOUS) ×2 IMPLANT
PACK BASIN MINOR ARMC (MISCELLANEOUS) ×2 IMPLANT
PAD GROUND ADULT SPLIT (MISCELLANEOUS) ×2 IMPLANT
SUCTION FRAZIER TIP 10 FR DISP (SUCTIONS) ×2 IMPLANT
SUT ETH BLK MONO 3 0 FS 1 12/B (SUTURE) ×4 IMPLANT
SUT ETHILON 4-0 (SUTURE)
SUT ETHILON 4-0 FS2 18XMFL BLK (SUTURE)
SUT SILK 0 (SUTURE) ×1
SUT SILK 0 30XBRD TIE 6 (SUTURE) ×1 IMPLANT
SUT SILK 1 SH (SUTURE) ×2 IMPLANT
SUT VIC AB 0 SH 27 (SUTURE) ×2 IMPLANT
SUT VIC AB 2-0 SH 27 (SUTURE) ×1
SUT VIC AB 2-0 SH 27XBRD (SUTURE) ×1 IMPLANT
SUT VIC AB 3-0 SH 27 (SUTURE)
SUT VIC AB 3-0 SH 27X BRD (SUTURE) IMPLANT
SUTURE ETHLN 4-0 FS2 18XMF BLK (SUTURE) IMPLANT
WATER STERILE IRR 1000ML POUR (IV SOLUTION) ×2 IMPLANT

## 2015-07-07 NOTE — Op Note (Signed)
  07/07/2015  1:17 PM  PATIENT:  Nathanial Rancher Pigford  74 y.o. male  PRE-OPERATIVE DIAGNOSIS:  Recurrent right pleural effusion  POST-OPERATIVE DIAGNOSIS:  Recurrent right pleural effusion  PROCEDURE:  Insertion of tunneled subcutaneous intrapleural catheter (Pleurx)  SURGEON:  Surgeon(s) and Role:    * Nestor Lewandowsky, MD - Primary  ASSISTANTS: None  ANESTHESIA: Local with intravenous sedation  INDICATIONS FOR PROCEDURE this is a 74 year old gentleman who has had multiple right-sided thoracenteses for recurrent pleural effusions. Indications and risks of Pleurx catheter insertion were explained to the patient gave his informed consent.  DICTATION: Patient is brought to the operating suite and placed in the lateral position. The right chest wall was prepped and draped in usual sterile fashion. 1% lidocaine was used as a local anesthetic. A needle was advanced over the top of the seventh rib and pleural effusion was aspirated. This was clear slightly yellow in color. A skin incision was made and carried down through the subcutaneous tissues until the muscle was reached. We then made a separate stab wound anteriorly to this and placed the catheter through the subcutaneous tunnel. Using the Seldinger technique and a peel-away sheath the catheter was inserted into the right pleural space. Approximately 1.3 L of fluid was aspirated from the right pleural space. The subtest tissues were closed with Vicryl and the skin with nylon. The catheter itself was secured to the chest wall with #1 silk. Patient tolerated the procedure well and was taken to the recovery room in stable condition after sterile dressings were applied.   Nestor Lewandowsky, MD

## 2015-07-07 NOTE — Anesthesia Preprocedure Evaluation (Signed)
Anesthesia Evaluation  Patient identified by MRN, date of birth, ID band Patient awake    Reviewed: Allergy & Precautions, NPO status , Patient's Chart, lab work & pertinent test results  Airway Mallampati: I  TM Distance: >3 FB Neck ROM: Limited    Dental  (+) Poor Dentition   Pulmonary COPD,  COPD inhaler, Current Smoker,     + decreased breath sounds      Cardiovascular Exercise Tolerance: Poor hypertension, Pt. on medications and Pt. on home beta blockers + Peripheral Vascular Disease and +CHF  Normal cardiovascular exam Rhythm:Regular Rate:Normal  EF 25%. HX CHF with pleural effusion.   Neuro/Psych    GI/Hepatic GERD  Medicated and Controlled,  Endo/Other    Renal/GU Renal diseaseHx of stones and a stent.     Musculoskeletal   Abdominal (+)  Abdomen: soft.    Peds  Hematology   Anesthesia Other Findings   Reproductive/Obstetrics                             Anesthesia Physical Anesthesia Plan  ASA: IV  Anesthesia Plan: MAC   Post-op Pain Management:    Induction: Intravenous  Airway Management Planned: Simple Face Mask  Additional Equipment:   Intra-op Plan:   Post-operative Plan:   Informed Consent: I have reviewed the patients History and Physical, chart, labs and discussed the procedure including the risks, benefits and alternatives for the proposed anesthesia with the patient or authorized representative who has indicated his/her understanding and acceptance.     Plan Discussed with: CRNA  Anesthesia Plan Comments:         Anesthesia Quick Evaluation

## 2015-07-07 NOTE — Anesthesia Procedure Notes (Signed)
Procedure Name: MAC Performed by: Leticia Mcdiarmid Pre-anesthesia Checklist: Patient identified, Emergency Drugs available, Suction available, Patient being monitored and Timeout performed Oxygen Delivery Method: Simple face mask       

## 2015-07-07 NOTE — Transfer of Care (Signed)
Immediate Anesthesia Transfer of Care Note  Patient: Collin [name]  Procedure(s) Performed: Procedure(s): INSERTION PLEURAL DRAINAGE CATHETER (Right)  Patient Location: PACU  Anesthesia Type:MAC  Level of Consciousness: awake, alert  and oriented  Airway & Oxygen Therapy: Patient Spontanous Breathing and Patient connected to face mask oxygen  Post-op Assessment: Report given to RN and Post -op Vital signs reviewed and stable  Post vital signs: Reviewed and stable  Last Vitals:  Filed Vitals:   07/07/15 1309  BP: 126/94  Pulse: 87  Temp: 36 C  Resp: 22    Complications: No apparent anesthesia complications

## 2015-07-07 NOTE — Care Management (Signed)
[facility] starter packet with CD and instructions given to patient and spouse and explained. Spouse expressed understanding that pt must call after discharge to setup delivery of supplies.4 pluerx kits present in room.

## 2015-07-07 NOTE — Progress Notes (Signed)
Pt requesting pain meds. Dr. Genevive Bi notified. Orders received for Percocet. Primary nurse to continue to monitor pt.

## 2015-07-07 NOTE — Pre-Procedure Instructions (Signed)
I have reviewed the history and physical and there are no changes.  The patient understands the proposed procedure and all questions answered.  Juelle Dickmann, MD  

## 2015-07-07 NOTE — Progress Notes (Signed)
Dr. Genevive Bi was notified of pt diet order. Orders received for Regular Diet.

## 2015-07-07 NOTE — Anesthesia Postprocedure Evaluation (Signed)
  Anesthesia Post-op Note  Patient: Collin [name]  Procedure(s) Performed: Procedure(s): INSERTION PLEURAL DRAINAGE CATHETER (Right)  Anesthesia type:MAC  Patient location: PACU  Post pain: Pain level controlled  Post assessment: Post-op Vital signs reviewed, Patient's Cardiovascular Status Stable, Respiratory Function Stable, Patent Airway and No signs of Nausea or vomiting  Post vital signs: Reviewed and stable  Last Vitals:  Filed Vitals:   07/07/15 1309  BP: 126/94  Pulse: 87  Temp: 36 C  Resp: 22    Level of consciousness: awake, alert  and patient cooperative  Complications: No apparent anesthesia complications

## 2015-07-07 NOTE — Care Management (Signed)
Patient will be discharge with PleurX catheter. 4 X 530ml  PleurX kits delivered to room by central supply. Order form insurance/RX and insurance form sent to [employer] via fax. (confirmation page received) Patient or representative will need to call [facility] at [phone] to start supply deliveries after discharge. Order will not be processed until patient is discharged. Contacted Manufacturing engineer at [employer]) for confirmation. She stated that patient will need to call 24 hours after discharge to start supplies. She will have her supervisor contact Marshell Garfinkel upon after order is processed. Nursing is provideing teaching on using pluerx.

## 2015-07-08 ENCOUNTER — Telehealth: Payer: Self-pay | Admitting: *Deleted

## 2015-07-08 ENCOUNTER — Other Ambulatory Visit: Payer: Self-pay | Admitting: *Deleted

## 2015-07-08 LAB — CBC
HCT: 41.9 % (ref 40.0–52.0)
HEMOGLOBIN: 13.2 g/dL (ref 13.0–18.0)
MCH: 24.1 pg — AB (ref 26.0–34.0)
MCHC: 31.4 g/dL — AB (ref 32.0–36.0)
MCV: 76.8 fL — ABNORMAL LOW (ref 80.0–100.0)
PLATELETS: 166 10*3/uL (ref 150–440)
RBC: 5.46 MIL/uL (ref 4.40–5.90)
RDW: 20.4 % — AB (ref 11.5–14.5)
WBC: 11.6 10*3/uL — ABNORMAL HIGH (ref 3.8–10.6)

## 2015-07-08 LAB — COMPREHENSIVE METABOLIC PANEL
ALBUMIN: 3.3 g/dL — AB (ref 3.5–5.0)
ALT: 12 U/L — ABNORMAL LOW (ref 17–63)
ANION GAP: 7 (ref 5–15)
AST: 15 U/L (ref 15–41)
Alkaline Phosphatase: 104 U/L (ref 38–126)
BUN: 30 mg/dL — ABNORMAL HIGH (ref 6–20)
CALCIUM: 8.6 mg/dL — AB (ref 8.9–10.3)
CHLORIDE: 107 mmol/L (ref 101–111)
CO2: 22 mmol/L (ref 22–32)
CREATININE: 1.81 mg/dL — AB (ref 0.61–1.24)
GFR calc non Af Amer: 36 mL/min — ABNORMAL LOW (ref >60)
GFR, EST AFRICAN AMERICAN: 41 mL/min — AB (ref >60)
Glucose, Bld: 137 mg/dL — ABNORMAL HIGH (ref 65–99)
POTASSIUM: 4.3 mmol/L (ref 3.5–5.1)
SODIUM: 136 mmol/L (ref 135–145)
Total Bilirubin: 0.5 mg/dL (ref 0.3–1.2)
Total Protein: 6 g/dL — ABNORMAL LOW (ref 6.5–8.1)

## 2015-07-08 MED ORDER — ALBUTEROL SULFATE (2.5 MG/3ML) 0.083% IN NEBU
2.5000 mg | INHALATION_SOLUTION | Freq: Four times a day (QID) | RESPIRATORY_TRACT | Status: DC
  Administered 2015-07-08: 2.5 mg via RESPIRATORY_TRACT
  Filled 2015-07-08: qty 3

## 2015-07-08 MED ORDER — OXYCODONE-ACETAMINOPHEN 5-325 MG PO TABS: 1.0000 | ORAL_TABLET | ORAL | Status: DC | PRN

## 2015-07-08 NOTE — Progress Notes (Signed)
IV was removed. Discharge instructions, follow-up appointments, and prescriptions were provided to the pt. All questions answered. The pt's wife demonstrated the proper way to drain the pleurX catheter. The pt was taken downstairs via wheelchair by volunteer services.

## 2015-07-08 NOTE — Progress Notes (Signed)
Collin [name] Inpatient Post-Op Note  Patient ID: Collin [name], male   DOB: 03-24-1941, 74 y.o.   MRN: [id number]  HISTORY: Breathing is much improved.  Not short of breath this morning   Filed Vitals:   07/08/15 0517  BP: 122/81  Pulse: 80  Temp: 98.2 F (36.8 C)  Resp: 20     EXAM: Resp: Lungs are clear bilaterally.  No respiratory distress, normal effort. Heart:  Regular without murmurs    Skin: Skin is warm and dry. No rash noted. No diaphoretic. No erythema. No pallor.  Psychiatric: Normal mood and affect. Normal behavior. Judgment and  thought content normal.    ASSESSMENT: Status post PleurX catheter    PLAN:   Discharge to home today See in the clinic in one week     Nestor Lewandowsky, MD

## 2015-07-08 NOTE — Telephone Encounter (Signed)
Opened in error

## 2015-07-08 NOTE — Discharge Summary (Signed)
Physician Discharge Summary  Patient ID: Collin [name] MRN: [id number] DOB/AGE: Nov 29, 1940 74 y.o.  Admit date: 07/07/2015 Discharge date: 07/08/2015   Discharge Diagnoses:  Active Problems:   Recurrent pleural effusion on right   Procedures:insertion Pleurx Catheter   Hospital Course: Did well overnight and discharged to home the following day after family and patient comfortable with PleurX management  Disposition: [facility] Not Defined  Discharge Instructions    Call MD for:  difficulty breathing, headache or visual disturbances    Complete by:  As directed      Call MD for:  extreme fatigue    Complete by:  As directed      Call MD for:  hives    Complete by:  As directed      Call MD for:  persistant dizziness or light-headedness    Complete by:  As directed      Call MD for:  persistant nausea and vomiting    Complete by:  As directed      Call MD for:  redness, tenderness, or signs of infection (pain, swelling, redness, odor or green/yellow discharge around incision site)    Complete by:  As directed      Call MD for:  severe uncontrolled pain    Complete by:  As directed      Call MD for:  temperature >100.4    Complete by:  As directed      Diet - low sodium heart healthy    Complete by:  As directed      Discharge instructions    Complete by:  As directed   Drain PleurX catheter once per week.     Increase activity slowly    Complete by:  As directed      Suture removal    Complete by:  As directed   Please call the [facility] to make followup appointment for one week.            Medication List    STOP taking these medications        fluconazole 100 MG tablet  Commonly known as:  DIFLUCAN      TAKE these medications        albuterol 108 (90 BASE) MCG/ACT inhaler  Commonly known as:  PROVENTIL HFA;VENTOLIN HFA  Inhale 2 puffs into the lungs every 6 (six) hours as needed for wheezing or shortness of breath.     ALPRAZolam 0.25 MG tablet  Commonly known as:  XANAX  Take 0.25 mg by mouth 2 (two) times daily as needed for anxiety.     aspirin 81 MG tablet  Take 81 mg by mouth daily as needed for pain.     carvedilol 6.25 MG tablet  Commonly known as:  COREG  Take 6.25 mg by mouth 2 (two) times daily with a meal.     diazepam 5 MG tablet  Commonly known as:  VALIUM  Take 1 tablet (5 mg total) by mouth every 12 (twelve) hours as needed for muscle spasms.     furosemide 20 MG tablet  Commonly known as:  LASIX  Take 2 tablets (40 mg total) by mouth 2 (two) times daily.     furosemide 20 MG tablet  Commonly known as:  LASIX  Take 1 tablet (20 mg total) by mouth daily.     multivitamin tablet  Take 1 tablet by mouth daily.     NITROSTAT 0.4 MG SL tablet  Generic drug:  nitroGLYCERIN  Take 0.4 mg by mouth every 5 (five) minutes x 3 doses as needed. For chest pain. If no relief call MD or go to emergency room.     Omega-3 1000 MG Caps  Take 1,000 mg by mouth daily.     omeprazole 20 MG capsule  Commonly known as:  PRILOSEC  Take 20 mg by mouth 2 (two) times daily before a meal.     oxyCODONE-acetaminophen 5-325 MG tablet  Commonly known as:  PERCOCET/ROXICET  Take 1-2 tablets by mouth every 4 (four) hours as needed for moderate pain.     tiotropium 18 MCG inhalation capsule  Commonly known as:  SPIRIVA HANDIHALER  Place 1 capsule (18 mcg total) into inhaler and inhale daily.      ASK your doctor about these medications        acetaminophen 325 MG tablet  Commonly known as:  TYLENOL  Take 650 mg by mouth every 6 (six) hours as needed.         Nestor Lewandowsky, MD

## 2015-07-08 NOTE — Care Management Note (Signed)
Case Management Note  Patient Details  Name: JEZIAH KRETSCHMER MRN: [id number] Date of Birth: December 09, 1940  Subjective/Objective:   Patient active with [facility]. Will have resumption of services. Notified Jason with [facility].  Spoke with patient and he verbalized understanding of pleurex drain and the importance of calling the number at discharge. No additional needs identified.                 Action/Plan: Home with [facility]  Expected Discharge Date:  07/08/15               Expected Discharge Plan:     In-House Referral:     Discharge planning Services     Post Acute Care Choice:    Choice offered to:     DME Arranged:    DME Agency:     HH Arranged:    [place] Agency:   [facility]   Status of Service:     Medicare Important Message Given:    Date Medicare IM Given:    Medicare IM give by:    Date Additional Medicare IM Given:    Additional Medicare Important Message give by:     If discussed at [place] of Stay Meetings, dates discussed:    Additional Comments:  Jolly Mango, RN 07/08/2015, 9:49 AM

## 2015-07-09 ENCOUNTER — Inpatient Hospital Stay: Payer: Medicaid Other | Admitting: Cardiothoracic Surgery

## 2015-07-09 ENCOUNTER — Telehealth: Payer: Self-pay | Admitting: Cardiothoracic Surgery

## 2015-07-09 DIAGNOSIS — J690 Pneumonitis due to inhalation of food and vomit: Secondary | ICD-10-CM | POA: Diagnosis not present

## 2015-07-09 DIAGNOSIS — N184 Chronic kidney disease, stage 4 (severe): Secondary | ICD-10-CM | POA: Diagnosis not present

## 2015-07-09 DIAGNOSIS — J440 Chronic obstructive pulmonary disease with acute lower respiratory infection: Secondary | ICD-10-CM | POA: Diagnosis not present

## 2015-07-09 DIAGNOSIS — I5022 Chronic systolic (congestive) heart failure: Secondary | ICD-10-CM | POA: Diagnosis not present

## 2015-07-09 DIAGNOSIS — K219 Gastro-esophageal reflux disease without esophagitis: Secondary | ICD-10-CM | POA: Diagnosis not present

## 2015-07-09 DIAGNOSIS — I129 Hypertensive chronic kidney disease with stage 1 through stage 4 chronic kidney disease, or unspecified chronic kidney disease: Secondary | ICD-10-CM | POA: Diagnosis not present

## 2015-07-09 NOTE — Telephone Encounter (Signed)
Pt advised of pre op date/time and sx date. Sx: 07/07/15 with Dr Marguerita Beards catherter insertion Pre op: 07/05/15 @ 11:30am--office.

## 2015-07-12 DIAGNOSIS — J690 Pneumonitis due to inhalation of food and vomit: Secondary | ICD-10-CM | POA: Diagnosis not present

## 2015-07-12 DIAGNOSIS — I129 Hypertensive chronic kidney disease with stage 1 through stage 4 chronic kidney disease, or unspecified chronic kidney disease: Secondary | ICD-10-CM | POA: Diagnosis not present

## 2015-07-12 DIAGNOSIS — N184 Chronic kidney disease, stage 4 (severe): Secondary | ICD-10-CM | POA: Diagnosis not present

## 2015-07-12 DIAGNOSIS — I5022 Chronic systolic (congestive) heart failure: Secondary | ICD-10-CM | POA: Diagnosis not present

## 2015-07-12 DIAGNOSIS — K219 Gastro-esophageal reflux disease without esophagitis: Secondary | ICD-10-CM | POA: Diagnosis not present

## 2015-07-12 DIAGNOSIS — J440 Chronic obstructive pulmonary disease with acute lower respiratory infection: Secondary | ICD-10-CM | POA: Diagnosis not present

## 2015-07-13 DIAGNOSIS — N390 Urinary tract infection, site not specified: Secondary | ICD-10-CM | POA: Diagnosis not present

## 2015-07-13 DIAGNOSIS — I10 Essential (primary) hypertension: Secondary | ICD-10-CM | POA: Diagnosis not present

## 2015-07-13 DIAGNOSIS — E875 Hyperkalemia: Secondary | ICD-10-CM | POA: Diagnosis not present

## 2015-07-13 DIAGNOSIS — R809 Proteinuria, unspecified: Secondary | ICD-10-CM | POA: Diagnosis not present

## 2015-07-13 DIAGNOSIS — N2581 Secondary hyperparathyroidism of renal origin: Secondary | ICD-10-CM | POA: Diagnosis not present

## 2015-07-13 DIAGNOSIS — N183 Chronic kidney disease, stage 3 (moderate): Secondary | ICD-10-CM | POA: Diagnosis not present

## 2015-07-15 ENCOUNTER — Inpatient Hospital Stay: Payer: Medicaid Other | Attending: Cardiothoracic Surgery | Admitting: Cardiothoracic Surgery

## 2015-07-15 ENCOUNTER — Encounter: Payer: Self-pay | Admitting: Cardiothoracic Surgery

## 2015-07-15 VITALS — BP 150/98 | HR 90 | Temp 96.2°F | Resp 20 | Ht 74.0 in | Wt 179.5 lb

## 2015-07-15 DIAGNOSIS — J90 Pleural effusion, not elsewhere classified: Secondary | ICD-10-CM | POA: Diagnosis present

## 2015-07-15 MED ORDER — OXYCODONE-ACETAMINOPHEN 5-325 MG PO TABS
1.0000 | ORAL_TABLET | Freq: Once | ORAL | Status: AC
  Administered 2015-07-15: 1 via ORAL
  Filled 2015-07-15: qty 1

## 2015-07-15 NOTE — Addendum Note (Signed)
Addended by: Lorrine Kin A on: 07/15/2015 11:46 AM   Modules accepted: Orders

## 2015-07-15 NOTE — Progress Notes (Signed)
Patient here for follow up drain placement. Reports he has had pain that is occasionally sharp on right side at drain site. Pain level 6/10. Reports he ran out of his pain medicine 2 days ago. Patient states he has SOB on exertion and uses oxygen 2L at home. Denies SOB at this time.

## 2015-07-15 NOTE — Addendum Note (Signed)
Addended by: Luella Cook on: 07/15/2015 11:37 AM   Modules accepted: Orders

## 2015-07-15 NOTE — Progress Notes (Signed)
[facility] Note  Patient ID: DAVONTA STROOT, male   DOB: [birth year], 74 y.o.   MRN: [id number]  HISTORY: He returns today in follow-up. His wife has been dressing the Pleurx catheter site every other day and has been doing an excellent job. He does complain of some shortness of breath and some pain at the chest tube site. He also complains of a lot of back pain as well. Requesting additional pain medication for most of his back issues. He denied any fevers. He does have an occasional cough.   Filed Vitals:   07/15/15 1048  BP: 160/89  Pulse: 90  Temp: 96.2 F (35.7 C)  Resp: 20     EXAM: Resp: Lungs are clear on the left and diminished over the posterior lower one half on the right..  No respiratory distress, normal effort. Heart:  Regular without murmurs Skin: Skin is warm and dry. No rash noted. No diaphoretic. No erythema. No pallor.  Psychiatric: Normal mood and affect. Normal behavior. Judgment and thought content normal.   The Pleurx catheter site looks fine. We did drain 1.2 L of fluid from the pleural space. Fluid is clear yellow.   ASSESSMENT: I did instruct the patient and his wife on how to manage the Pleurx catheter drain. We will bring him back again in one week to remove his sutures. They will begin draining the catheter Mondays Wednesdays and Fridays.   PLAN:   I will see the patient back again in one week for suture removal. He will continue his follow-up with Dr. Raul Del which we will make. I did give him 1 Percocet today causes some pain at the chest tube site. He requested additional narcotics which I declined to provide to him as he states his other physicians will not give him any narcotics.    Nestor Lewandowsky, MD

## 2015-07-16 DIAGNOSIS — R0609 Other forms of dyspnea: Secondary | ICD-10-CM | POA: Diagnosis not present

## 2015-07-16 DIAGNOSIS — J42 Unspecified chronic bronchitis: Secondary | ICD-10-CM | POA: Diagnosis not present

## 2015-07-16 DIAGNOSIS — J90 Pleural effusion, not elsewhere classified: Secondary | ICD-10-CM | POA: Diagnosis not present

## 2015-07-22 ENCOUNTER — Inpatient Hospital Stay (HOSPITAL_BASED_OUTPATIENT_CLINIC_OR_DEPARTMENT_OTHER): Payer: Medicaid Other | Admitting: Cardiothoracic Surgery

## 2015-07-22 VITALS — BP 145/94 | HR 85 | Temp 96.8°F | Resp 20

## 2015-07-22 DIAGNOSIS — J90 Pleural effusion, not elsewhere classified: Secondary | ICD-10-CM | POA: Diagnosis not present

## 2015-07-22 NOTE — Progress Notes (Signed)
Pt here for suture removal and family asked if I would drain the pleurx and Dr. Genevive Bi had spoke to me last week and told me he was coming in and stitches would need to be removed and if he needs draining then do it.  Can draw 1000 ml off.  Pt states some discomfort at pleurx insertion site with the ribs.  Sat 95%. Performed drainage and it was 950 ml. Clear yellow fluid. Sutures removed around insertion site of pleurx tube.  Also removed stitches that was used to go into insert the drain.  Sites both are pink around sutures and tubing, no drainage noted.  steristrips placed and informed pt that they wear off but do not pull them  Off. Advised to be careful on the tube. Clean around site with every dressing change.  Wife in the room and saw the site and verbalized understanding of the  Process. She was to call if any fever, drainage around the insertion site or any other problems pt may have.  After procedure VS chart pain was 2 same area. He has pain med at home to take. Got a wheelchair for him. Wife took him on elevator to exit the building. Sat s98% and pt stable to go home. Approx. 48 min spent with pt with suture removal and dainage of pleurx and vs obtained

## 2015-07-27 ENCOUNTER — Ambulatory Visit: Payer: Medicare Other | Admitting: Family

## 2015-07-28 DIAGNOSIS — I129 Hypertensive chronic kidney disease with stage 1 through stage 4 chronic kidney disease, or unspecified chronic kidney disease: Secondary | ICD-10-CM | POA: Diagnosis not present

## 2015-07-28 DIAGNOSIS — K219 Gastro-esophageal reflux disease without esophagitis: Secondary | ICD-10-CM | POA: Diagnosis not present

## 2015-07-28 DIAGNOSIS — J440 Chronic obstructive pulmonary disease with acute lower respiratory infection: Secondary | ICD-10-CM | POA: Diagnosis not present

## 2015-07-28 DIAGNOSIS — J690 Pneumonitis due to inhalation of food and vomit: Secondary | ICD-10-CM | POA: Diagnosis not present

## 2015-07-28 DIAGNOSIS — N184 Chronic kidney disease, stage 4 (severe): Secondary | ICD-10-CM | POA: Diagnosis not present

## 2015-07-28 DIAGNOSIS — I5022 Chronic systolic (congestive) heart failure: Secondary | ICD-10-CM | POA: Diagnosis not present

## 2015-07-30 DIAGNOSIS — J90 Pleural effusion, not elsewhere classified: Secondary | ICD-10-CM | POA: Diagnosis not present

## 2015-07-30 DIAGNOSIS — I429 Cardiomyopathy, unspecified: Secondary | ICD-10-CM | POA: Diagnosis not present

## 2015-07-30 DIAGNOSIS — F17200 Nicotine dependence, unspecified, uncomplicated: Secondary | ICD-10-CM | POA: Diagnosis not present

## 2015-07-30 DIAGNOSIS — R0602 Shortness of breath: Secondary | ICD-10-CM | POA: Diagnosis not present

## 2015-08-03 DIAGNOSIS — E782 Mixed hyperlipidemia: Secondary | ICD-10-CM | POA: Diagnosis not present

## 2015-08-03 DIAGNOSIS — J90 Pleural effusion, not elsewhere classified: Secondary | ICD-10-CM | POA: Diagnosis not present

## 2015-08-03 DIAGNOSIS — J42 Unspecified chronic bronchitis: Secondary | ICD-10-CM | POA: Diagnosis not present

## 2015-08-03 DIAGNOSIS — I5023 Acute on chronic systolic (congestive) heart failure: Secondary | ICD-10-CM | POA: Diagnosis not present

## 2015-08-03 DIAGNOSIS — I10 Essential (primary) hypertension: Secondary | ICD-10-CM | POA: Diagnosis not present

## 2015-08-12 DIAGNOSIS — N183 Chronic kidney disease, stage 3 (moderate): Secondary | ICD-10-CM | POA: Diagnosis not present

## 2015-08-12 DIAGNOSIS — N2581 Secondary hyperparathyroidism of renal origin: Secondary | ICD-10-CM | POA: Diagnosis not present

## 2015-08-12 DIAGNOSIS — I129 Hypertensive chronic kidney disease with stage 1 through stage 4 chronic kidney disease, or unspecified chronic kidney disease: Secondary | ICD-10-CM | POA: Diagnosis not present

## 2015-08-12 DIAGNOSIS — N390 Urinary tract infection, site not specified: Secondary | ICD-10-CM | POA: Diagnosis not present

## 2015-08-12 DIAGNOSIS — R809 Proteinuria, unspecified: Secondary | ICD-10-CM | POA: Diagnosis not present

## 2015-08-14 DIAGNOSIS — I129 Hypertensive chronic kidney disease with stage 1 through stage 4 chronic kidney disease, or unspecified chronic kidney disease: Secondary | ICD-10-CM | POA: Diagnosis not present

## 2015-08-14 DIAGNOSIS — N184 Chronic kidney disease, stage 4 (severe): Secondary | ICD-10-CM | POA: Diagnosis not present

## 2015-08-14 DIAGNOSIS — K219 Gastro-esophageal reflux disease without esophagitis: Secondary | ICD-10-CM | POA: Diagnosis not present

## 2015-08-14 DIAGNOSIS — J440 Chronic obstructive pulmonary disease with acute lower respiratory infection: Secondary | ICD-10-CM | POA: Diagnosis not present

## 2015-08-14 DIAGNOSIS — I5022 Chronic systolic (congestive) heart failure: Secondary | ICD-10-CM | POA: Diagnosis not present

## 2015-08-14 DIAGNOSIS — J690 Pneumonitis due to inhalation of food and vomit: Secondary | ICD-10-CM | POA: Diagnosis not present

## 2015-08-16 DIAGNOSIS — J440 Chronic obstructive pulmonary disease with acute lower respiratory infection: Secondary | ICD-10-CM | POA: Diagnosis not present

## 2015-08-16 DIAGNOSIS — K219 Gastro-esophageal reflux disease without esophagitis: Secondary | ICD-10-CM | POA: Diagnosis not present

## 2015-08-16 DIAGNOSIS — Z8701 Personal history of pneumonia (recurrent): Secondary | ICD-10-CM | POA: Diagnosis not present

## 2015-08-16 DIAGNOSIS — Z438 Encounter for attention to other artificial openings: Secondary | ICD-10-CM | POA: Diagnosis not present

## 2015-08-16 DIAGNOSIS — J90 Pleural effusion, not elsewhere classified: Secondary | ICD-10-CM | POA: Diagnosis not present

## 2015-08-16 DIAGNOSIS — I129 Hypertensive chronic kidney disease with stage 1 through stage 4 chronic kidney disease, or unspecified chronic kidney disease: Secondary | ICD-10-CM | POA: Diagnosis not present

## 2015-08-16 DIAGNOSIS — N184 Chronic kidney disease, stage 4 (severe): Secondary | ICD-10-CM | POA: Diagnosis not present

## 2015-08-16 DIAGNOSIS — E785 Hyperlipidemia, unspecified: Secondary | ICD-10-CM | POA: Diagnosis not present

## 2015-08-16 DIAGNOSIS — I5022 Chronic systolic (congestive) heart failure: Secondary | ICD-10-CM | POA: Diagnosis not present

## 2015-08-16 DIAGNOSIS — Z720 Tobacco use: Secondary | ICD-10-CM | POA: Diagnosis not present

## 2015-08-22 ENCOUNTER — Inpatient Hospital Stay
Admission: EM | Admit: 2015-08-22 | Discharge: 2015-08-31 | DRG: 291 | Disposition: A | Discharge status: Home / Self Care | Payer: Medicare Other | Attending: Specialist | Admitting: Specialist

## 2015-08-22 ENCOUNTER — Emergency Department: Payer: Medicare Other

## 2015-08-22 ENCOUNTER — Encounter: Payer: Self-pay | Admitting: Emergency Medicine

## 2015-08-22 DIAGNOSIS — I25719 Atherosclerosis of autologous vein coronary artery bypass graft(s) with unspecified angina pectoris: Secondary | ICD-10-CM | POA: Diagnosis not present

## 2015-08-22 DIAGNOSIS — J441 Chronic obstructive pulmonary disease with (acute) exacerbation: Secondary | ICD-10-CM | POA: Diagnosis present

## 2015-08-22 DIAGNOSIS — I5023 Acute on chronic systolic (congestive) heart failure: Secondary | ICD-10-CM | POA: Diagnosis present

## 2015-08-22 DIAGNOSIS — R05 Cough: Secondary | ICD-10-CM

## 2015-08-22 DIAGNOSIS — N10 Acute tubulo-interstitial nephritis: Secondary | ICD-10-CM | POA: Diagnosis present

## 2015-08-22 DIAGNOSIS — H35039 Hypertensive retinopathy, unspecified eye: Secondary | ICD-10-CM | POA: Diagnosis present

## 2015-08-22 DIAGNOSIS — N200 Calculus of kidney: Secondary | ICD-10-CM | POA: Diagnosis present

## 2015-08-22 DIAGNOSIS — N390 Urinary tract infection, site not specified: Secondary | ICD-10-CM | POA: Insufficient documentation

## 2015-08-22 DIAGNOSIS — D72829 Elevated white blood cell count, unspecified: Secondary | ICD-10-CM | POA: Diagnosis present

## 2015-08-22 DIAGNOSIS — R918 Other nonspecific abnormal finding of lung field: Secondary | ICD-10-CM | POA: Diagnosis not present

## 2015-08-22 DIAGNOSIS — J810 Acute pulmonary edema: Secondary | ICD-10-CM | POA: Diagnosis not present

## 2015-08-22 DIAGNOSIS — E875 Hyperkalemia: Secondary | ICD-10-CM | POA: Diagnosis present

## 2015-08-22 DIAGNOSIS — E871 Hypo-osmolality and hyponatremia: Secondary | ICD-10-CM | POA: Diagnosis present

## 2015-08-22 DIAGNOSIS — E785 Hyperlipidemia, unspecified: Secondary | ICD-10-CM | POA: Diagnosis present

## 2015-08-22 DIAGNOSIS — J440 Chronic obstructive pulmonary disease with acute lower respiratory infection: Secondary | ICD-10-CM | POA: Diagnosis present

## 2015-08-22 DIAGNOSIS — R7989 Other specified abnormal findings of blood chemistry: Secondary | ICD-10-CM | POA: Diagnosis not present

## 2015-08-22 DIAGNOSIS — F17200 Nicotine dependence, unspecified, uncomplicated: Secondary | ICD-10-CM | POA: Diagnosis present

## 2015-08-22 DIAGNOSIS — N2581 Secondary hyperparathyroidism of renal origin: Secondary | ICD-10-CM | POA: Diagnosis not present

## 2015-08-22 DIAGNOSIS — I130 Hypertensive heart and chronic kidney disease with heart failure and stage 1 through stage 4 chronic kidney disease, or unspecified chronic kidney disease: Secondary | ICD-10-CM | POA: Diagnosis not present

## 2015-08-22 DIAGNOSIS — N183 Chronic kidney disease, stage 3 (moderate): Secondary | ICD-10-CM | POA: Diagnosis present

## 2015-08-22 DIAGNOSIS — J90 Pleural effusion, not elsewhere classified: Secondary | ICD-10-CM | POA: Diagnosis present

## 2015-08-22 DIAGNOSIS — N136 Pyonephrosis: Secondary | ICD-10-CM | POA: Diagnosis present

## 2015-08-22 DIAGNOSIS — R748 Abnormal levels of other serum enzymes: Secondary | ICD-10-CM | POA: Diagnosis not present

## 2015-08-22 DIAGNOSIS — Z936 Other artificial openings of urinary tract status: Secondary | ICD-10-CM

## 2015-08-22 DIAGNOSIS — R809 Proteinuria, unspecified: Secondary | ICD-10-CM | POA: Diagnosis present

## 2015-08-22 DIAGNOSIS — I5022 Chronic systolic (congestive) heart failure: Secondary | ICD-10-CM | POA: Diagnosis not present

## 2015-08-22 DIAGNOSIS — J449 Chronic obstructive pulmonary disease, unspecified: Secondary | ICD-10-CM | POA: Diagnosis present

## 2015-08-22 DIAGNOSIS — Z9119 Patient's noncompliance with other medical treatment and regimen: Secondary | ICD-10-CM | POA: Diagnosis not present

## 2015-08-22 DIAGNOSIS — Z436 Encounter for attention to other artificial openings of urinary tract: Secondary | ICD-10-CM

## 2015-08-22 DIAGNOSIS — K219 Gastro-esophageal reflux disease without esophagitis: Secondary | ICD-10-CM | POA: Diagnosis present

## 2015-08-22 DIAGNOSIS — J45901 Unspecified asthma with (acute) exacerbation: Secondary | ICD-10-CM | POA: Diagnosis present

## 2015-08-22 DIAGNOSIS — J209 Acute bronchitis, unspecified: Secondary | ICD-10-CM | POA: Diagnosis present

## 2015-08-22 DIAGNOSIS — N132 Hydronephrosis with renal and ureteral calculous obstruction: Secondary | ICD-10-CM | POA: Diagnosis not present

## 2015-08-22 DIAGNOSIS — I10 Essential (primary) hypertension: Secondary | ICD-10-CM | POA: Diagnosis not present

## 2015-08-22 DIAGNOSIS — R778 Other specified abnormalities of plasma proteins: Secondary | ICD-10-CM

## 2015-08-22 DIAGNOSIS — H3530 Unspecified macular degeneration: Secondary | ICD-10-CM | POA: Diagnosis present

## 2015-08-22 DIAGNOSIS — I129 Hypertensive chronic kidney disease with stage 1 through stage 4 chronic kidney disease, or unspecified chronic kidney disease: Secondary | ICD-10-CM | POA: Diagnosis not present

## 2015-08-22 DIAGNOSIS — Z720 Tobacco use: Secondary | ICD-10-CM | POA: Diagnosis not present

## 2015-08-22 DIAGNOSIS — I248 Other forms of acute ischemic heart disease: Secondary | ICD-10-CM | POA: Diagnosis present

## 2015-08-22 DIAGNOSIS — Z438 Encounter for attention to other artificial openings: Secondary | ICD-10-CM | POA: Diagnosis not present

## 2015-08-22 DIAGNOSIS — N179 Acute kidney failure, unspecified: Secondary | ICD-10-CM | POA: Diagnosis present

## 2015-08-22 DIAGNOSIS — Z7982 Long term (current) use of aspirin: Secondary | ICD-10-CM | POA: Diagnosis not present

## 2015-08-22 DIAGNOSIS — R059 Cough, unspecified: Secondary | ICD-10-CM

## 2015-08-22 DIAGNOSIS — R601 Generalized edema: Secondary | ICD-10-CM | POA: Diagnosis not present

## 2015-08-22 DIAGNOSIS — N1330 Unspecified hydronephrosis: Secondary | ICD-10-CM | POA: Diagnosis not present

## 2015-08-22 DIAGNOSIS — R000 Tachycardia, unspecified: Secondary | ICD-10-CM | POA: Diagnosis not present

## 2015-08-22 DIAGNOSIS — F17210 Nicotine dependence, cigarettes, uncomplicated: Secondary | ICD-10-CM | POA: Diagnosis present

## 2015-08-22 DIAGNOSIS — R0602 Shortness of breath: Secondary | ICD-10-CM

## 2015-08-22 LAB — BASIC METABOLIC PANEL
Anion gap: 8 (ref 5–15)
BUN: 44 mg/dL — ABNORMAL HIGH (ref 6–20)
CALCIUM: 9.3 mg/dL (ref 8.9–10.3)
CO2: 25 mmol/L (ref 22–32)
CREATININE: 2.32 mg/dL — AB (ref 0.61–1.24)
Chloride: 104 mmol/L (ref 101–111)
GFR, EST AFRICAN AMERICAN: 31 mL/min — AB (ref >60)
GFR, EST NON AFRICAN AMERICAN: 26 mL/min — AB (ref >60)
GLUCOSE: 132 mg/dL — AB (ref 65–99)
Potassium: 5.6 mmol/L — ABNORMAL HIGH (ref 3.5–5.1)
Sodium: 137 mmol/L (ref 135–145)

## 2015-08-22 LAB — BRAIN NATRIURETIC PEPTIDE

## 2015-08-22 LAB — CBC
HCT: 46.2 % (ref 40.0–52.0)
Hemoglobin: 14.1 g/dL (ref 13.0–18.0)
MCH: 23 pg — AB (ref 26.0–34.0)
MCHC: 30.5 g/dL — AB (ref 32.0–36.0)
MCV: 75.3 fL — ABNORMAL LOW (ref 80.0–100.0)
PLATELETS: 253 10*3/uL (ref 150–440)
RBC: 6.14 MIL/uL — ABNORMAL HIGH (ref 4.40–5.90)
RDW: 20.3 % — ABNORMAL HIGH (ref 11.5–14.5)
WBC: 14.7 10*3/uL — ABNORMAL HIGH (ref 3.8–10.6)

## 2015-08-22 LAB — URINALYSIS COMPLETE WITH MICROSCOPIC (ARMC ONLY)
Bilirubin Urine: NEGATIVE
GLUCOSE, UA: NEGATIVE mg/dL
Ketones, ur: NEGATIVE mg/dL
Nitrite: NEGATIVE
PROTEIN: 100 mg/dL — AB
SPECIFIC GRAVITY, URINE: 1.012 (ref 1.005–1.030)
pH: 6 (ref 5.0–8.0)

## 2015-08-22 LAB — TROPONIN I
TROPONIN I: 0.04 ng/mL — AB (ref <0.031)
Troponin I: 0.13 ng/mL — ABNORMAL HIGH (ref <0.031)

## 2015-08-22 MED ORDER — SODIUM CHLORIDE 0.9 % IV SOLN: 250.0000 mL | INTRAVENOUS | Status: DC | PRN

## 2015-08-22 MED ORDER — MORPHINE SULFATE (PF) 2 MG/ML IV SOLN
2.0000 mg | Freq: Once | INTRAVENOUS | Status: AC
  Administered 2015-08-22: 2 mg via INTRAVENOUS
  Filled 2015-08-22: qty 1

## 2015-08-22 MED ORDER — FUROSEMIDE 10 MG/ML IJ SOLN
40.0000 mg | Freq: Two times a day (BID) | INTRAMUSCULAR | Status: DC
  Administered 2015-08-22 – 2015-08-23 (×2): 40 mg via INTRAVENOUS
  Filled 2015-08-22 (×2): qty 4

## 2015-08-22 MED ORDER — ONDANSETRON HCL 4 MG/2ML IJ SOLN
4.0000 mg | Freq: Once | INTRAMUSCULAR | Status: AC
  Administered 2015-08-22: 4 mg via INTRAVENOUS
  Filled 2015-08-22: qty 2

## 2015-08-22 MED ORDER — SODIUM CHLORIDE 0.9 % IJ SOLN
3.0000 mL | Freq: Two times a day (BID) | INTRAMUSCULAR | Status: DC
  Administered 2015-08-22 – 2015-08-23 (×3): 3 mL via INTRAVENOUS

## 2015-08-22 MED ORDER — CARVEDILOL 6.25 MG PO TABS
6.2500 mg | ORAL_TABLET | Freq: Two times a day (BID) | ORAL | Status: DC
  Filled 2015-08-22: qty 1

## 2015-08-22 MED ORDER — SODIUM CHLORIDE 0.9 % IV SOLN
1.0000 g | Freq: Once | INTRAVENOUS | Status: AC
  Administered 2015-08-22: 1 g via INTRAVENOUS
  Filled 2015-08-22: qty 10

## 2015-08-22 MED ORDER — SODIUM CHLORIDE 0.9 % IJ SOLN
3.0000 mL | Freq: Two times a day (BID) | INTRAMUSCULAR | Status: DC
  Administered 2015-08-22 – 2015-08-30 (×17): 3 mL via INTRAVENOUS

## 2015-08-22 MED ORDER — ASPIRIN EC 81 MG PO TBEC
81.0000 mg | DELAYED_RELEASE_TABLET | Freq: Every day | ORAL | Status: DC
  Administered 2015-08-23: 81 mg via ORAL
  Filled 2015-08-22 (×4): qty 1

## 2015-08-22 MED ORDER — SODIUM CHLORIDE 0.9 % IJ SOLN: 3.0000 mL | INTRAMUSCULAR | Status: DC | PRN

## 2015-08-22 MED ORDER — DOCUSATE SODIUM 100 MG PO CAPS
100.0000 mg | ORAL_CAPSULE | Freq: Two times a day (BID) | ORAL | Status: DC
  Administered 2015-08-22 – 2015-08-31 (×16): 100 mg via ORAL
  Filled 2015-08-22 (×17): qty 1

## 2015-08-22 MED ORDER — ACETAMINOPHEN 325 MG PO TABS: 650.0000 mg | ORAL_TABLET | Freq: Four times a day (QID) | ORAL | Status: DC | PRN

## 2015-08-22 MED ORDER — NITROGLYCERIN 2 % TD OINT
1.0000 [in_us] | TOPICAL_OINTMENT | Freq: Four times a day (QID) | TRANSDERMAL | Status: DC
  Administered 2015-08-23 – 2015-08-24 (×7): 1 [in_us] via TOPICAL
  Filled 2015-08-22 (×7): qty 1

## 2015-08-22 MED ORDER — ALBUTEROL SULFATE HFA 108 (90 BASE) MCG/ACT IN AERS: 2.0000 | INHALATION_SPRAY | Freq: Four times a day (QID) | RESPIRATORY_TRACT | Status: DC | PRN

## 2015-08-22 MED ORDER — HEPARIN SODIUM (PORCINE) 5000 UNIT/ML IJ SOLN
5000.0000 [IU] | Freq: Three times a day (TID) | INTRAMUSCULAR | Status: DC
  Administered 2015-08-22 – 2015-08-24 (×5): 5000 [IU] via SUBCUTANEOUS
  Filled 2015-08-22 (×5): qty 1

## 2015-08-22 MED ORDER — ONDANSETRON HCL 4 MG/2ML IJ SOLN
4.0000 mg | Freq: Four times a day (QID) | INTRAMUSCULAR | Status: DC | PRN
  Administered 2015-08-23 (×2): 4 mg via INTRAVENOUS
  Filled 2015-08-22 (×2): qty 2

## 2015-08-22 MED ORDER — DEXTROSE 5 % IV SOLN
1.0000 g | INTRAVENOUS | Status: DC
  Administered 2015-08-23 – 2015-08-30 (×8): 1 g via INTRAVENOUS
  Filled 2015-08-22 (×9): qty 10

## 2015-08-22 MED ORDER — ONDANSETRON HCL 4 MG PO TABS: 4.0000 mg | ORAL_TABLET | Freq: Four times a day (QID) | ORAL | Status: DC | PRN

## 2015-08-22 MED ORDER — DEXTROSE 5 % IV SOLN
1.0000 g | INTRAVENOUS | Status: DC
  Filled 2015-08-22: qty 10

## 2015-08-22 MED ORDER — ALPRAZOLAM 0.25 MG PO TABS
0.2500 mg | ORAL_TABLET | Freq: Every evening | ORAL | Status: DC | PRN
  Administered 2015-08-23 – 2015-08-28 (×2): 0.25 mg via ORAL
  Filled 2015-08-22 (×2): qty 1

## 2015-08-22 MED ORDER — DEXTROSE 5 % IV SOLN
500.0000 mg | INTRAVENOUS | Status: DC
  Administered 2015-08-22 – 2015-08-30 (×9): 500 mg via INTRAVENOUS
  Filled 2015-08-22 (×13): qty 500

## 2015-08-22 MED ORDER — PANTOPRAZOLE SODIUM 40 MG PO TBEC
40.0000 mg | DELAYED_RELEASE_TABLET | Freq: Every day | ORAL | Status: DC
  Administered 2015-08-25 – 2015-08-31 (×7): 40 mg via ORAL
  Filled 2015-08-22 (×7): qty 1

## 2015-08-22 MED ORDER — PREDNISONE 50 MG PO TABS
50.0000 mg | ORAL_TABLET | Freq: Every day | ORAL | Status: DC
  Administered 2015-08-22: 50 mg via ORAL
  Filled 2015-08-22: qty 1
  Filled 2015-08-22: qty 2

## 2015-08-22 MED ORDER — ACETAMINOPHEN 650 MG RE SUPP: 650.0000 mg | Freq: Four times a day (QID) | RECTAL | Status: DC | PRN

## 2015-08-22 MED ORDER — ALBUTEROL SULFATE (2.5 MG/3ML) 0.083% IN NEBU
2.5000 mg | INHALATION_SOLUTION | Freq: Four times a day (QID) | RESPIRATORY_TRACT | Status: DC | PRN
  Administered 2015-08-28: 2.5 mg via RESPIRATORY_TRACT
  Filled 2015-08-22: qty 3

## 2015-08-22 MED ORDER — NITROGLYCERIN 0.4 MG SL SUBL: 0.4000 mg | SUBLINGUAL_TABLET | SUBLINGUAL | Status: DC | PRN

## 2015-08-22 MED ORDER — NICOTINE POLACRILEX 2 MG MT GUM
2.0000 mg | CHEWING_GUM | OROMUCOSAL | Status: DC | PRN
  Filled 2015-08-22: qty 1

## 2015-08-22 MED ORDER — DEXTROSE 5 % IV SOLN
1.0000 g | Freq: Once | INTRAVENOUS | Status: AC
  Administered 2015-08-22: 1 g via INTRAVENOUS
  Filled 2015-08-22: qty 10

## 2015-08-22 MED ORDER — TIOTROPIUM BROMIDE MONOHYDRATE 18 MCG IN CAPS
18.0000 ug | ORAL_CAPSULE | Freq: Every day | RESPIRATORY_TRACT | Status: DC
  Administered 2015-08-23 – 2015-08-31 (×7): 18 ug via RESPIRATORY_TRACT
  Filled 2015-08-22 (×2): qty 5

## 2015-08-22 MED ORDER — CALCIUM GLUCONATE 10 % IV SOLN
INTRAVENOUS | Status: AC
  Filled 2015-08-22: qty 10

## 2015-08-22 MED ORDER — HYDROCODONE-ACETAMINOPHEN 5-325 MG PO TABS
1.0000 | ORAL_TABLET | ORAL | Status: DC | PRN
  Administered 2015-08-23: 1 via ORAL
  Filled 2015-08-22 (×2): qty 1

## 2015-08-22 MED ORDER — OXYCODONE-ACETAMINOPHEN 5-325 MG PO TABS
1.0000 | ORAL_TABLET | ORAL | Status: DC | PRN
  Administered 2015-08-22: 1 via ORAL
  Administered 2015-08-23: 2 via ORAL
  Administered 2015-08-23: 1 via ORAL
  Administered 2015-08-23 – 2015-08-26 (×15): 2 via ORAL
  Administered 2015-08-26: 1 via ORAL
  Administered 2015-08-26: 2 via ORAL
  Administered 2015-08-26: 1 via ORAL
  Administered 2015-08-27 – 2015-08-31 (×24): 2 via ORAL
  Filled 2015-08-22: qty 2
  Filled 2015-08-22: qty 1
  Filled 2015-08-22 (×10): qty 2
  Filled 2015-08-22: qty 1
  Filled 2015-08-22 (×8): qty 2
  Filled 2015-08-22: qty 1
  Filled 2015-08-22 (×21): qty 2
  Filled 2015-08-22: qty 1
  Filled 2015-08-22: qty 2

## 2015-08-22 MED ORDER — ASPIRIN 81 MG PO CHEW
324.0000 mg | CHEWABLE_TABLET | Freq: Once | ORAL | Status: AC
  Administered 2015-08-22: 324 mg via ORAL
  Filled 2015-08-22: qty 4

## 2015-08-22 MED ORDER — MOMETASONE FURO-FORMOTEROL FUM 100-5 MCG/ACT IN AERO
2.0000 | INHALATION_SPRAY | Freq: Two times a day (BID) | RESPIRATORY_TRACT | Status: DC
Start: 2015-08-22 — End: 2015-08-26
  Administered 2015-08-22 – 2015-08-26 (×7): 2 via RESPIRATORY_TRACT
  Filled 2015-08-22: qty 8.8

## 2015-08-22 NOTE — ED Provider Notes (Signed)
[facility] Emergency Department Provider Note   ____________________________________________  Time seen: 1330  I have reviewed the triage vital signs and the nursing notes.   HISTORY  Chief Complaint Shortness of Breath   History limited by: Not Limited   HPI Collin [name] is a 74 y.o. male with history of CHF who comes into the emergency department today with complaints of pain and shortness of breath. In addition the patient states that he is noticing increased swelling. He states the symptoms have gotten worse since yesterday. He states that the pain is in his back chest and in the stomach. In addition the patient states he has had decreased urination. He has tried taking additional doses of his Lasix. He feels like he has had some distention of his abdomen. He denies any fevers.   Past Medical History  Diagnosis Date  . COPD (chronic obstructive pulmonary disease) ([place])   . Chronic systolic CHF (congestive heart failure) ([place])   . Kidney stone   . Macular degeneration   . HLD (hyperlipidemia)   . Hypertensive retinopathy   . HTN (hypertension)   . GERD (gastroesophageal reflux disease)     Patient Active Problem List   Diagnosis Date Noted  . 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 Acute on chronic respiratory failure ([place]) 06/12/2015  . Acute on chronic systolic CHF (congestive heart failure) ([place]) 03/19/2015  . Open wound of left heel 03/19/2015  . Recurrent right pleural effusion 03/04/2015  . Acute respiratory failure with hypoxemia ([place]) 03/04/2015  . COPD (chronic obstructive pulmonary disease) ([place]) 03/04/2015  . HLD (hyperlipidemia) 03/04/2015  . Chronic systolic CHF (congestive heart failure) ([place]) 03/04/2015  . HTN (hypertension) 03/04/2015  . GERD (gastroesophageal reflux disease) 03/04/2015    Past Surgical History  Procedure Laterality Date  . Abdominal aortic aneurysm repair    . Hernia repair    . Kidney stone  surgery    . Chest tube insertion Right 07/07/2015    Procedure: INSERTION PLEURAL DRAINAGE CATHETER;  Surgeon: Nestor Lewandowsky, MD;  Location: [facility] ORS;  Service: Thoracic;  Laterality: Right;    Current Outpatient Rx  Name  Route  Sig  Dispense  Refill  . albuterol (PROVENTIL HFA;VENTOLIN HFA) 108 (90 BASE) MCG/ACT inhaler   Inhalation   Inhale 2 puffs into the lungs every 6 (six) hours as needed for wheezing or shortness of breath.          Marland Kitchen aspirin 81 MG tablet   Oral   Take 81 mg by mouth daily as needed for pain.          . carvedilol (COREG) 6.25 MG tablet   Oral   Take 6.25 mg by mouth 2 (two) times daily with a meal.          . furosemide (LASIX) 20 MG tablet   Oral   Take 1 tablet (20 mg total) by mouth daily. Patient taking differently: Take 20 mg by mouth at bedtime.    30 tablet   1   . Multiple Vitamin (MULTIVITAMIN) tablet   Oral   Take 1 tablet by mouth daily.         . nitroGLYCERIN (NITROSTAT) 0.4 MG SL tablet   Oral   Take 0.4 mg by mouth every 5 (five) minutes x 3 doses as needed. For chest pain. If no relief call MD or go to emergency room.         Marland Kitchen omeprazole (PRILOSEC) 20 MG capsule  Oral   Take 20 mg by mouth 2 (two) times daily before a meal.         . oxyCODONE-acetaminophen (PERCOCET/ROXICET) 5-325 MG tablet   Oral   Take 1-2 tablets by mouth every 4 (four) hours as needed for moderate pain. Patient not taking: Reported on 07/15/2015   30 tablet   0   . tiotropium (SPIRIVA HANDIHALER) 18 MCG inhalation capsule   Inhalation   Place 1 capsule (18 mcg total) into inhaler and inhale daily.   30 capsule   0     Allergies Lisinopril and Metoprolol  Family History  Problem Relation Age of Onset  . Heart attack Mother   . Stroke Mother   . Cancer Father   . COPD Father   . Heart attack Father     Social History Social History  Substance Use Topics  . Smoking status: Current Every Day Smoker -- 0.25 packs/day for 45  years    Types: Cigarettes  . Smokeless tobacco: Never Used  . Alcohol Use: No    Review of Systems  Constitutional: Negative for fever. Cardiovascular: Positive for chest pain. Respiratory: Positive for shortness of breath. Gastrointestinal: Positive for abdominal pain Genitourinary: Negative for dysuria. Musculoskeletal: Negative for back pain. Skin: Negative for rash. Neurological: Negative for headaches, focal weakness or numbness.   10-point ROS otherwise negative.  ____________________________________________   PHYSICAL EXAM:  VITAL SIGNS: ED Triage Vitals  Enc Vitals Group     BP 08/22/15 1255 150/123 mmHg     Pulse Rate 08/22/15 1255 100     Resp 08/22/15 1255 28     Temp --      Temp src --      SpO2 08/22/15 1255 96 %     Weight 08/22/15 1255 185 lb (83.915 kg)     Height 08/22/15 1255 $RemoveBefor'6\' 2"'[id number]$  (1.88 m)     Head Cir --      Peak Flow --      Pain Score 08/22/15 1256 10   Constitutional: Alert and oriented. Appears in mild respiratory distress. Eyes: Conjunctivae are normal. PERRL. Normal extraocular movements. ENT   Head: Normocephalic and atraumatic.   Nose: No congestion/rhinnorhea.   Mouth/Throat: Mucous membranes are moist.   Neck: No stridor. Hematological/Lymphatic/Immunilogical: No cervical lymphadenopathy. Cardiovascular: Normal rate, regular rhythm.  No murmurs, rubs, or gallops. Respiratory: Mildly increased respiratory effort. Decreased breath sounds globally. Gastrointestinal: Soft and nontender. Slightly distended. Genitourinary: Deferred Musculoskeletal: Normal range of motion in all extremities. No joint effusions.  No lower extremity tenderness nor edema. Neurologic:  Normal speech and language. No gross focal neurologic deficits are appreciated.  Skin:  Skin is warm, dry and intact. No rash noted. Psychiatric: Mood and affect are normal. Speech and behavior are normal. Patient exhibits appropriate insight and  judgment.  ____________________________________________    LABS (pertinent positives/negatives)  Labs Reviewed  BASIC METABOLIC PANEL - Abnormal; Notable for the following:    Potassium 5.6 (*)    Glucose, Bld 132 (*)    BUN 44 (*)    Creatinine, Ser 2.32 (*)    GFR calc non Af Amer 26 (*)    GFR calc Af Amer 31 (*)    All other components within normal limits  CBC - Abnormal; Notable for the following:    WBC 14.7 (*)    RBC 6.14 (*)    MCV 75.3 (*)    MCH 23.0 (*)    MCHC 30.5 (*)    RDW  20.3 (*)    All other components within normal limits  BRAIN NATRIURETIC PEPTIDE - Abnormal; Notable for the following:    B Natriuretic Peptide >4500.0 (*)    All other components within normal limits  TROPONIN I - Abnormal; Notable for the following:    Troponin I 0.13 (*)    All other components within normal limits  URINALYSIS COMPLETEWITH MICROSCOPIC ([facility] ONLY) - Abnormal; Notable for the following:    Color, Urine YELLOW (*)    APPearance TURBID (*)    Hgb urine dipstick 3+ (*)    Protein, ur 100 (*)    Leukocytes, UA 3+ (*)    Bacteria, UA FEW (*)    Squamous Epithelial / LPF 0-5 (*)    All other components within normal limits  CULTURE, BLOOD (ROUTINE X 2)  CULTURE, BLOOD (ROUTINE X 2)     ____________________________________________   EKG  I, Nance Pear, attending physician, personally viewed and interpreted this EKG  EKG Time: 1307 Rate: 102 Rhythm: sinus tachycardia Axis: right axis deviation Intervals: qtc 417 QRS: narrow, q waves V1 ST changes: no st elevation Impression: abnormal ekg ____________________________________________    RADIOLOGY  CXR IMPRESSION: Evidence of a degree of congestive heart failure. Pleuravac type catheter right base. No pneumothorax.   ____________________________________________   PROCEDURES  Procedure(s) performed: None  Critical Care performed: No  ____________________________________________   INITIAL  IMPRESSION / ASSESSMENT AND PLAN / ED COURSE  Pertinent labs & imaging results that were available during my care of the patient were reviewed by me and considered in my medical decision making (see chart for details).  Patient presented to the emergency department today with multiple medical complaints. Workup concerning for urinary tract infection. Additionally workup concerning for some pulmonary edema. Furthermore the patient's troponin was elevated. Given these myriad findings patient will be admitted to the hospital service for further evaluation workup.  ____________________________________________   FINAL CLINICAL IMPRESSION(S) / ED DIAGNOSES  Final diagnoses:  UTI (lower urinary tract infection)  Shortness of breath  Acute pulmonary edema (HCC)  Elevated troponin     Nance Pear, MD 08/22/15 1607

## 2015-08-22 NOTE — Progress Notes (Signed)
A & O. Wife at the bedside. Pt reports improved pain. Tachy. 2 L of oxygen. IV abx given. High fall risk. Skin checked by Izora Gala RN. Pleurx cath to R side of chest.

## 2015-08-22 NOTE — H&P (Addendum)
[facility] [place] NAME: Collin [name]    MR#:  [id number]  DATE OF BIRTH:  08-06-1941  DATE OF ADMISSION:  08/22/2015  PRIMARY CARE PHYSICIAN: [name], MD   REQUESTING/REFERRING PHYSICIAN:   CHIEF COMPLAINT:   Chief Complaint  Patient presents with  . Shortness of Breath    HISTORY OF PRESENT ILLNESS: Collin [name]  is a 74 y.o. male with a known history of multiple medical problems including COPD, CHF, hypertension, hyperlipidemia who presents to the hospital with complaints of severe shortness of breath which started yesterday. Been also complaining of abdominal pain as well as right-sided chest pain at the site where Pleurx catheter is placed. He has been coughing and producing green thick phlegm. He is wheezing. He is also having nausea and he on arrival to emergency room, his been vomiting. He is complaining of difficulty voiding, even with Lasix is not able to pass urine, complains of right flank pain on percussion, but it is unclear if it is chronic due to Pleurx catheter placement. Since he is not feeling well, he presents to the hospital for further evaluation. His labs revealed acute on chronic renal failure, hyperkalemia, elevated troponin. Leukocytosis. Chest x-ray showed CHF. Urinalysis was remarkable for  Pyuria. Hospitalist services were contacted for admission. Patient claims that he stopped smoking 3 days ago, smoked approximately half pack a day  PAST MEDICAL HISTORY:   Past Medical History  Diagnosis Date  . COPD (chronic obstructive pulmonary disease) ([place])   . Chronic systolic CHF (congestive heart failure) ([place])   . Kidney stone   . Macular degeneration   . HLD (hyperlipidemia)   . Hypertensive retinopathy   . HTN (hypertension)   . GERD (gastroesophageal reflux disease)     PAST SURGICAL HISTORY:  Past Surgical History  Procedure Laterality Date  . Abdominal aortic aneurysm repair    . Hernia  repair    . Kidney stone surgery    . Chest tube insertion Right 07/07/2015    Procedure: INSERTION PLEURAL DRAINAGE CATHETER;  Surgeon: Nestor Lewandowsky, MD;  Location: [facility] ORS;  Service: Thoracic;  Laterality: Right;    SOCIAL HISTORY:  Social History  Substance Use Topics  . Smoking status: Current Every Day Smoker -- 0.25 packs/day for 45 years    Types: Cigarettes  . Smokeless tobacco: Never Used  . Alcohol Use: No    FAMILY HISTORY:  Family History  Problem Relation Age of Onset  . Heart attack Mother   . Stroke Mother   . Cancer Father   . COPD Father   . Heart attack Father     DRUG ALLERGIES:  Allergies  Allergen Reactions  . Lisinopril Swelling    Pt reports throat swelling.  . Metoprolol     Other reaction(s): Other (See Comments) Caused ringing in ears    Review of Systems  Unable to perform ROS: critical illness    MEDICATIONS AT HOME:  Prior to Admission medications   Medication Sig Start Date End Date Taking? Authorizing Provider  ADVAIR DISKUS 100-50 MCG/DOSE AEPB Inhale 1 puff into the lungs 2 (two) times daily. 07/30/15  Yes Historical Provider, MD  albuterol (PROVENTIL HFA;VENTOLIN HFA) 108 (90 BASE) MCG/ACT inhaler Inhale 2 puffs into the lungs every 6 (six) hours as needed for wheezing or shortness of breath.    Yes Historical Provider, MD  ALPRAZolam Duanne Moron) 0.25 MG tablet Take 0.25 mg by mouth at bedtime as needed. For  sleep   Yes Historical Provider, MD  aspirin 81 MG tablet Take 81 mg by mouth daily as needed.    Yes Historical Provider, MD  carvedilol (COREG) 6.25 MG tablet Take 6.25 mg by mouth 2 (two) times daily with a meal.  06/28/15 06/27/16 Yes Historical Provider, MD  furosemide (LASIX) 20 MG tablet Take 1 tablet (20 mg total) by mouth daily. Patient taking differently: Take 20-40 mg by mouth See admin instructions. Take 2 tablets (40mg ) orally once a day (in the morning), and take 1 tablet orally once a day at bedtime. 06/14/15  Yes Fritzi Mandes, MD  Multiple Vitamin (MULTIVITAMIN) tablet Take 1 tablet by mouth daily.   Yes Historical Provider, MD  nitroGLYCERIN (NITROSTAT) 0.4 MG SL tablet Take 0.4 mg by mouth every 5 (five) minutes x 3 doses as needed. For chest pain. If no relief call MD or go to emergency room. 10/25/12  Yes Historical Provider, MD  omeprazole (PRILOSEC) 20 MG capsule Take 20 mg by mouth 2 (two) times daily before a meal.   Yes Historical Provider, MD  tiotropium (SPIRIVA HANDIHALER) 18 MCG inhalation capsule Place 1 capsule (18 mcg total) into inhaler and inhale daily. 03/06/15  Yes Srikar Sudini, MD  oxyCODONE-acetaminophen (PERCOCET/ROXICET) 5-325 MG tablet Take 1-2 tablets by mouth every 4 (four) hours as needed for moderate pain. Patient not taking: Reported on 07/15/2015 07/08/15   Nestor Lewandowsky, MD      PHYSICAL EXAMINATION:   VITAL SIGNS: Blood pressure 141/93, pulse 106, resp. rate 21, height 6\' 2"  (1.88 m), weight 83.915 kg (185 lb), SpO2 99 %.  GENERAL:  74 y.o.-year-old patient lying in the bed in severe respiratory distress, coughing,  very uncomfortable,  panting. Holding bag of vomitus in front of himself and leaning forward, heavy breathing, tachypneic EYES: Pupils equal, round, reactive to light and accommodation. No scleral icterus. Extraocular muscles intact.  HEENT: Head atraumatic, normocephalic. Oropharynx and nasopharynx clear.  NECK:  Supple, no jugular venous distention. No thyroid enlargement, no tenderness.  LUNGS: Centimeters/breath sounds bilaterally, diffuse crackles posteriorly, no wheezing, but intermittent bilateral rales,rhonchi anteriorly noted on auscultation. Using accessory muscles of respiration.  CARDIOVASCULAR: S1, S2 normal. No murmurs, rubs, or gallops.  ABDOMEN: Moderately firm, distended, diffusely tender. Bowel sounds present. No organomegaly or mass, although very difficult to evaluate due to distention of abdomen.  EXTREMITIES: No pedal edema, cyanosis, or clubbing.   NEUROLOGIC: Cranial nerves II through XII are intact. Muscle strength 5/5 in all extremities. Sensation intact. Gait not checked.  PSYCHIATRIC: The patient is alert and oriented x 3.  SKIN: No obvious rash, lesion, or ulcer.   LABORATORY PANEL:   CBC  Recent Labs Lab 08/22/15 1322  WBC 14.7*  HGB 14.1  HCT 46.2  PLT 253  MCV 75.3*  MCH 23.0*  MCHC 30.5*  RDW 20.3*   ------------------------------------------------------------------------------------------------------------------  Chemistries   Recent Labs Lab 08/22/15 1322  NA 137  K 5.6*  CL 104  CO2 25  GLUCOSE 132*  BUN 44*  CREATININE 2.32*  CALCIUM 9.3   ------------------------------------------------------------------------------------------------------------------  Cardiac Enzymes  Recent Labs Lab 08/22/15 1322  TROPONINI 0.13*   ------------------------------------------------------------------------------------------------------------------  RADIOLOGY: Dg Chest 2 View  08/22/2015  CLINICAL DATA:  Shortness of Breath EXAM: CHEST  2 VIEW COMPARISON:  July 07, 2015 FINDINGS: There is a pleuravac catheter at the right base. There is a small pleural effusion on the right. There is generalized interstitial edema. There is no alveolar/airspace consolidation. There is cardiomegaly with  pulmonary venous hypertension. No adenopathy. There is atherosclerotic calcification in the aorta. IMPRESSION: Evidence of a degree of congestive heart failure. Pleuravac type catheter right base. No pneumothorax. Electronically Signed   By: Lowella Grip III M.D.   On: 08/22/2015 14:50    EKG: Orders placed or performed during the hospital encounter of 08/22/15  . ED EKG within 10 minutes  . ED EKG within 10 minutes    IMPRESSION AND PLAN:  Active Problems:   Acute congestive heart failure ([place]) 1. Acute pulmonary edema. Admit patient, medical floor, initiate him on morphine, nitroglycerin topically, as well as  Lasix, continue oxygen therapy, keeping pulse oximeter is around 94% and above, following clinically. Following patient's weight as well as ins and outs 2. Elevated troponin suspected demand ischemia, , Continue aspirin as well as Coreg, continue morphine, following cardiac enzymes 3. Good cardiologist involved for further recommendations 3. Hyperkalemia. Follow with Lasix administration 4. Acute on chronic renal failure, suspect acute urinary retention, place Foley catheter. Follow patient's ins and outs and follow kidney function in the morning 5. COPD exacerbation due to acute bronchitis versus pneumonia, start patient on steroids, inhalation therapy with Advair Diskus, albuterol , tiotropium, initiate antibiotic therapy with Rocephin and Zithromax, get sputum cultures 6. Tobacco abuse. Initiate patient on nicotine replacement therapy. Discussed this patient for 3 minutes 7. Acute  pyelonephritis, initiate patient on Rocephin after getting urinary cultures. Follow culture results   All the records are reviewed and case discussed with ED provider. Management plans discussed with the patient, family and they are in agreement.  CODE STATUS:    TOTAL  critical care TIME TAKING CARE OF THIS PATIENT: 55 minutes.    Theodoro Grist M.D on 08/22/2015 at 4:11 PM  Between 7am to 6pm - Pager - [phone] After 6pm go to [email or web address] - password [email or web address] [facility]  Office  [phone]  CC: Primary care physician; [name], MD

## 2015-08-22 NOTE — ED Notes (Addendum)
C/o increased sob since yesterday, pt has hx of CHF, states he also has had increased swelling in extremities, has a pluravac and has had more fluid output since yesterday, denies any cp, also having difficulty urinating

## 2015-08-22 NOTE — ED Notes (Signed)
MD at bedside. 

## 2015-08-23 ENCOUNTER — Inpatient Hospital Stay: Payer: Medicare Other

## 2015-08-23 DIAGNOSIS — N1330 Unspecified hydronephrosis: Secondary | ICD-10-CM | POA: Insufficient documentation

## 2015-08-23 DIAGNOSIS — N390 Urinary tract infection, site not specified: Secondary | ICD-10-CM | POA: Insufficient documentation

## 2015-08-23 LAB — BASIC METABOLIC PANEL
ANION GAP: 8 (ref 5–15)
Anion gap: 9 (ref 5–15)
Anion gap: 9 (ref 5–15)
BUN: 51 mg/dL — ABNORMAL HIGH (ref 6–20)
BUN: 59 mg/dL — AB (ref 6–20)
BUN: 56 mg/dL — AB (ref 6–20)
CALCIUM: 8.9 mg/dL (ref 8.9–10.3)
CALCIUM: 8.4 mg/dL — AB (ref 8.9–10.3)
CO2: 20 mmol/L — AB (ref 22–32)
CO2: 18 mmol/L — ABNORMAL LOW (ref 22–32)
CO2: 18 mmol/L — ABNORMAL LOW (ref 22–32)
CREATININE: 3.3 mg/dL — AB (ref 0.61–1.24)
CREATININE: 3.79 mg/dL — AB (ref 0.61–1.24)
Calcium: 8.7 mg/dL — ABNORMAL LOW (ref 8.9–10.3)
Chloride: 105 mmol/L (ref 101–111)
Chloride: 103 mmol/L (ref 101–111)
Chloride: 101 mmol/L (ref 101–111)
Creatinine, Ser: 3.64 mg/dL — ABNORMAL HIGH (ref 0.61–1.24)
GFR calc Af Amer: 20 mL/min — ABNORMAL LOW (ref >60)
GFR calc Af Amer: 18 mL/min — ABNORMAL LOW (ref >60)
GFR calc Af Amer: 17 mL/min — ABNORMAL LOW (ref >60)
GFR, EST NON AFRICAN AMERICAN: 17 mL/min — AB (ref >60)
GFR, EST NON AFRICAN AMERICAN: 15 mL/min — AB (ref >60)
GFR, EST NON AFRICAN AMERICAN: 15 mL/min — AB (ref >60)
GLUCOSE: 160 mg/dL — AB (ref 65–99)
Glucose, Bld: 187 mg/dL — ABNORMAL HIGH (ref 65–99)
Glucose, Bld: 145 mg/dL — ABNORMAL HIGH (ref 65–99)
POTASSIUM: 5.8 mmol/L — AB (ref 3.5–5.1)
POTASSIUM: 5.9 mmol/L — AB (ref 3.5–5.1)
Potassium: 5.6 mmol/L — ABNORMAL HIGH (ref 3.5–5.1)
SODIUM: 130 mmol/L — AB (ref 135–145)
SODIUM: 128 mmol/L — AB (ref 135–145)
Sodium: 133 mmol/L — ABNORMAL LOW (ref 135–145)

## 2015-08-23 LAB — CBC
HCT: 42.3 % (ref 40.0–52.0)
HEMOGLOBIN: 13 g/dL (ref 13.0–18.0)
MCH: 23 pg — AB (ref 26.0–34.0)
MCHC: 30.6 g/dL — AB (ref 32.0–36.0)
MCV: 75 fL — ABNORMAL LOW (ref 80.0–100.0)
PLATELETS: 215 10*3/uL (ref 150–440)
RBC: 5.65 MIL/uL (ref 4.40–5.90)
RDW: 20.2 % — ABNORMAL HIGH (ref 11.5–14.5)
WBC: 24.4 10*3/uL — ABNORMAL HIGH (ref 3.8–10.6)

## 2015-08-23 LAB — URINALYSIS COMPLETE WITH MICROSCOPIC (ARMC ONLY)
BILIRUBIN URINE: NEGATIVE
GLUCOSE, UA: NEGATIVE mg/dL
Ketones, ur: NEGATIVE mg/dL
Nitrite: NEGATIVE
Protein, ur: 100 mg/dL — AB
Specific Gravity, Urine: 1.015 (ref 1.005–1.030)
Squamous Epithelial / LPF: NONE SEEN
pH: 5 (ref 5.0–8.0)

## 2015-08-23 LAB — TROPONIN I
TROPONIN I: 0.04 ng/mL — AB (ref <0.031)
Troponin I: 0.04 ng/mL — ABNORMAL HIGH (ref <0.031)

## 2015-08-23 LAB — HEMOGLOBIN A1C: HEMOGLOBIN A1C: 6.2 % — AB (ref 4.0–6.0)

## 2015-08-23 MED ORDER — SODIUM POLYSTYRENE SULFONATE 15 GM/60ML PO SUSP
30.0000 g | Freq: Once | ORAL | Status: AC
Start: 2015-08-23 — End: 2015-08-23
  Administered 2015-08-23: 30 g via ORAL
  Filled 2015-08-23: qty 120

## 2015-08-23 MED ORDER — DEXTROSE 50 % IV SOLN
25.0000 mL | Freq: Once | INTRAVENOUS | Status: AC
  Administered 2015-08-23: 25 mL via INTRAVENOUS
  Filled 2015-08-23: qty 50

## 2015-08-23 MED ORDER — SODIUM CHLORIDE 0.9 % IV SOLN
1.0000 g | Freq: Once | INTRAVENOUS | Status: AC
  Administered 2015-08-23: 1 g via INTRAVENOUS
  Filled 2015-08-23: qty 10

## 2015-08-23 MED ORDER — INSULIN ASPART 100 UNIT/ML ~~LOC~~ SOLN
10.0000 [IU] | Freq: Once | SUBCUTANEOUS | Status: AC
  Administered 2015-08-23: 10 [IU] via SUBCUTANEOUS
  Filled 2015-08-23: qty 10

## 2015-08-23 MED ORDER — FUROSEMIDE 10 MG/ML IJ SOLN: 20.0000 mg | Freq: Two times a day (BID) | INTRAMUSCULAR | Status: DC

## 2015-08-23 MED ORDER — CARVEDILOL 12.5 MG PO TABS
12.5000 mg | ORAL_TABLET | Freq: Two times a day (BID) | ORAL | Status: DC
  Administered 2015-08-23 – 2015-08-31 (×15): 12.5 mg via ORAL
  Filled 2015-08-23 (×17): qty 1

## 2015-08-23 MED ORDER — SODIUM POLYSTYRENE SULFONATE 15 GM/60ML PO SUSP: 30.0000 g | Freq: Once | ORAL | Status: DC

## 2015-08-23 NOTE — Progress Notes (Signed)
[facility]  ROUNDING NOTE   Subjective:  Patient well-known to [place]. We follow him for chronic kidney disease stage III with a baseline creatinine of 1.4 with EGFR of 48. However he has significant underlying proteinuria. He presents now with significant shortness of breath. He has a right-sided Pleurx catheter in place.    Objective:  Vital signs in last 24 hours:  Temp:  [97.3 F (36.3 C)-98.1 F (36.7 C)] 97.5 F (36.4 C) (12/12 1113) Pulse Rate:  [96-111] 106 (12/12 1113) Resp:  [18-22] 22 (12/12 0800) BP: (126-157)/(0-94) 126/89 mmHg (12/12 1113) SpO2:  [94 %-99 %] 95 % (12/12 1113) Weight:  [86.274 kg (190 lb 3.2 oz)] 86.274 kg (190 lb 3.2 oz) (12/12 0500)  Weight change:  Filed Weights   08/22/15 1255 08/23/15 0500  Weight: 83.915 kg (185 lb) 86.274 kg (190 lb 3.2 oz)    Intake/Output: I/O last 3 completed shifts: In: -  Out: 600 [Urine:600]   Intake/Output this shift:  Total I/O In: 480 [P.O.:480] Out: -   Physical Exam: General: NAD, resting in bed  Head: Normocephalic, atraumatic. Moist oral mucosal membranes  Eyes: Anicteric  Neck: Supple, trachea midline  Lungs:  Basilar rales, normal effort  Heart: S1S2 no rubs  Abdomen:  Soft, nontender, BS present   Extremities: trace peripheral edema.  Neurologic: Nonfocal, moving all four extremities  Skin: No lesions       Basic Metabolic Panel:  Recent Labs Lab 08/22/15 1322 08/23/15 0602 08/23/15 1235  NA 137 133* 130*  K 5.6* 5.6* 5.8*  CL 104 105 103  CO2 25 20* 18*  GLUCOSE 132* 160* 187*  BUN 44* 51* 59*  CREATININE 2.32* 3.30* 3.64*  CALCIUM 9.3 8.9 8.7*    Liver Function Tests: No results for input(s): AST, ALT, ALKPHOS, BILITOT, PROT, ALBUMIN in the last 168 hours. No results for input(s): LIPASE, AMYLASE in the last 168 hours. No results for input(s): AMMONIA in the last 168 hours.  CBC:  Recent Labs Lab 08/22/15 1322 08/23/15 0602  WBC 14.7* 24.4*  HGB 14.1  13.0  HCT 46.2 42.3  MCV 75.3* 75.0*  PLT 253 215    Cardiac Enzymes:  Recent Labs Lab 08/22/15 1322 08/22/15 1950 08/23/15 0120 08/23/15 0602  TROPONINI 0.13* 0.04* 0.04* 0.04*    BNP: Invalid input(s): POCBNP  CBG: No results for input(s): GLUCAP in the last 168 hours.  Microbiology: Results for orders placed or performed during the hospital encounter of 08/22/15  Blood culture (routine x 2)     Status: None (Preliminary result)   Collection Time: 08/22/15  4:39 PM  Result Value Ref Range Status   Specimen Description BLOOD RIGHT FOREARM  Final   Special Requests   Final    BOTTLES DRAWN AEROBIC AND ANAEROBIC  AER 6CC ANA 2CC   Culture NO GROWTH < 24 HOURS  Final   Report Status PENDING  Incomplete  Blood culture (routine x 2)     Status: None (Preliminary result)   Collection Time: 08/22/15  4:40 PM  Result Value Ref Range Status   Specimen Description BLOOD LEFT ARM  Final   Special Requests BOTTLES DRAWN AEROBIC AND ANAEROBIC  1CC  Final   Culture NO GROWTH < 24 HOURS  Final   Report Status PENDING  Incomplete    Coagulation Studies: No results for input(s): LABPROT, INR in the last 72 hours.  Urinalysis:  Recent Labs  08/22/15 [place] 1.012  PHURINE  6.0  GLUCOSEU NEGATIVE  HGBUR 3+*  BILIRUBINUR NEGATIVE  KETONESUR NEGATIVE  PROTEINUR 100*  NITRITE NEGATIVE  LEUKOCYTESUR 3+*      Imaging: Dg Chest 2 View  08/22/2015  CLINICAL DATA:  Shortness of Breath EXAM: CHEST  2 VIEW COMPARISON:  July 07, 2015 FINDINGS: There is a pleuravac catheter at the right base. There is a small pleural effusion on the right. There is generalized interstitial edema. There is no alveolar/airspace consolidation. There is cardiomegaly with pulmonary venous hypertension. No adenopathy. There is atherosclerotic calcification in the aorta. IMPRESSION: Evidence of a degree of congestive heart failure. Pleuravac type catheter right base. No  pneumothorax. Electronically Signed   By: Lowella Grip III M.D.   On: 08/22/2015 14:50     Medications:     . aspirin EC  81 mg Oral Daily  . azithromycin  500 mg Intravenous Q24H  . calcium gluconate  1 g Intravenous Once  . carvedilol  12.5 mg Oral BID WC  . cefTRIAXone (ROCEPHIN)  IV  1 g Intravenous Q24H  . dextrose  25 mL Intravenous Once  . docusate sodium  100 mg Oral BID  . heparin  5,000 Units Subcutaneous 3 times per day  . insulin aspart  10 Units Subcutaneous Once  . mometasone-formoterol  2 puff Inhalation BID  . nitroGLYCERIN  1 inch Topical 4 times per day  . pantoprazole  40 mg Oral Daily  . predniSONE  50 mg Oral Q breakfast  . sodium chloride  3 mL Intravenous Q12H  . sodium chloride  3 mL Intravenous Q12H  . tiotropium  18 mcg Inhalation Daily   sodium chloride, acetaminophen **OR** acetaminophen, albuterol, ALPRAZolam, HYDROcodone-acetaminophen, nicotine polacrilex, nitroGLYCERIN, ondansetron **OR** ondansetron (ZOFRAN) IV, oxyCODONE-acetaminophen, sodium chloride  Assessment/ Plan:  74 y.o. male with hypertension, nephrolithiasis, COPD, GERD, anxiety, AAA repair, recurrent right pleural effusions  1.  Acute renal failure/CKD stage III baseline EGFR 48/Proteinuria/hydronephrosis.  We have been following him for significant proteinuria as an outpatient.  He has history of MGUS.  We plan to reevaluate renal ultrasound and SPEP/UPEP.  We may need to consider renal biopsy over time. He is also found to have right-sided hydronephrosis.  He has a right ureteral stent in place and has not had urology followup for this issue.  We will request urology followup as he's had pyuria and its been sometime since he's had a stent exchange.   2.  Hypertension:  Blood pressure accetpable, continue coreg.      LOS: 1 Mckay Brandt 12/12/20163:16 PM

## 2015-08-23 NOTE — Consult Note (Addendum)
Urology Consult  I have been asked to see the patient by Dr. Holley Raring, for evaluation and management of kidney stones, right hydronephrosis, pyuria.  Chief Complaint: kidney stones, right hydronephrosis, pyuria  History of Present Illness: Collin [name] is a 74 y.o. year old with multiple medical problems including CKD, COPD, CHF who was admitted to the hospital with worsening shortness of breath (Pleurx catheter in place) and difficulty urinating . During this admission, he is also noted to have acute on chronic renal failure with a creatinine rising to 3.64 from his previous baseline of 1.4, rising leukocytosis, and significant pyuria.   He is known to the urology service for history of recurrent nephrolithiasis and was formerly a patient of [facility] urology (Dr. Bernardo Heater).  He was seen in consultation in December 2015 with severe acute renal failure up to a 8.23 requiring temporary hemodialysis. During that admission, he underwent right ureteral stent placement with significant improvement of his creatinine.  He is scheduled for outpatient follow-up but failed to follow up on multiple occasions for various reasons mostly related to other medical problems and hospitalizations. His stent has now been in place for over a year and not been changed.  As part of the workup for his worsening renal function this admission, he underwent renal ultrasound which shows right moderate hydronephrosis with a indwelling retained right ureteral stent in place. Prior to this, he did have a CT abdomen and pelvis 06/06/2015 which shows the same right moderate hydronephrosis along with a significant right-sided stone burden and new stones alongside of the ureteral stent. He also has significant left-sided nonobstructing calculi in an atrophic kidney.    He's had multiple UAs over the past several months all of which shows significant pyuria.  Despite this, all cultures have been negative other than for mixed flora and  Candida albicans. Urine culture from this admission pending.  On admission, Foley catheter was placed for possible retention contributing to his acute on chronic renal failure.  Antibiotic started on this admission include Rocephin and Zithromax. He was also started on prednisone for presumed COPD exacerbation.  Initially upon admission, he was also treated with Lasix for the purpose of diuresis given the presence of acute pulmonary edema.   Past Medical History  Diagnosis Date  . COPD (chronic obstructive pulmonary disease) ([place])   . Chronic systolic CHF (congestive heart failure) ([place])   . Kidney stone   . Macular degeneration   . HLD (hyperlipidemia)   . Hypertensive retinopathy   . HTN (hypertension)   . GERD (gastroesophageal reflux disease)     Past Surgical History  Procedure Laterality Date  . Abdominal aortic aneurysm repair    . Hernia repair    . Kidney stone surgery    . Chest tube insertion Right 07/07/2015    Procedure: INSERTION PLEURAL DRAINAGE CATHETER;  Surgeon: Nestor Lewandowsky, MD;  Location: [facility] ORS;  Service: Thoracic;  Laterality: Right;    Home Medications:    Medication List    ASK your doctor about these medications        ADVAIR DISKUS 100-50 MCG/DOSE Aepb  Generic drug:  Fluticasone-Salmeterol  Inhale 1 puff into the lungs 2 (two) times daily.     albuterol 108 (90 BASE) MCG/ACT inhaler  Commonly known as:  PROVENTIL HFA;VENTOLIN HFA  Inhale 2 puffs into the lungs every 6 (six) hours as needed for wheezing or shortness of breath.     ALPRAZolam 0.25 MG tablet  Commonly known  as:  XANAX  Take 0.25 mg by mouth at bedtime as needed. For sleep     aspirin 81 MG tablet  Take 81 mg by mouth daily as needed.     carvedilol 6.25 MG tablet  Commonly known as:  COREG  Take 6.25 mg by mouth 2 (two) times daily with a meal.     furosemide 20 MG tablet  Commonly known as:  LASIX  Take 1 tablet (20 mg total) by mouth daily.     multivitamin tablet    Take 1 tablet by mouth daily.     NITROSTAT 0.4 MG SL tablet  Generic drug:  nitroGLYCERIN  Take 0.4 mg by mouth every 5 (five) minutes x 3 doses as needed. For chest pain. If no relief call MD or go to emergency room.     omeprazole 20 MG capsule  Commonly known as:  PRILOSEC  Take 20 mg by mouth 2 (two) times daily before a meal.     oxyCODONE-acetaminophen 5-325 MG tablet  Commonly known as:  PERCOCET/ROXICET  Take 1-2 tablets by mouth every 4 (four) hours as needed for moderate pain.     tiotropium 18 MCG inhalation capsule  Commonly known as:  SPIRIVA HANDIHALER  Place 1 capsule (18 mcg total) into inhaler and inhale daily.        Allergies:  Allergies  Allergen Reactions  . Lisinopril Swelling    Pt reports throat swelling.  . Metoprolol     Other reaction(s): Other (See Comments) Caused ringing in ears    Family History  Problem Relation Age of Onset  . Heart attack Mother   . Stroke Mother   . Cancer Father   . COPD Father   . Heart attack Father     Social History:  reports that he has been smoking Cigarettes.  He has a 11.25 pack-year smoking history. He has never used smokeless tobacco. He reports that he does not drink alcohol or use illicit drugs.  ROS: A complete review of systems was performed including 12 systems..  In addition to the above, patient endorses worsening shortness of breath.  Mild occasional flank pain.  Other the aforementioned, review systems is negative.  Physical Exam:  Vital signs in last 24 hours: Temp:  [97.5 F (36.4 C)-98.1 F (36.7 C)] 97.5 F (36.4 C) (12/12 1113) Pulse Rate:  [96-111] 98 (12/12 1654) Resp:  [18-22] 22 (12/12 0800) BP: (126-156)/(76-93) 136/90 mmHg (12/12 1654) SpO2:  [94 %-95 %] 95 % (12/12 1113) Weight:  [190 lb 3.2 oz (86.274 kg)] 190 lb 3.2 oz (86.274 kg) (12/12 0500) Constitutional:  Alert and oriented, No acute distress HEENT: [place] AT, moist mucus membranes.  Trachea midline, no  masses Cardiovascular: Regular rate and rhythm, no clubbing, cyanosis, or edema. Respiratory: Patient appears in mild respiratory distress, wearing oxygen, tachypnea, coughing, wheezing, and has increased work of breathing. Right-sided Pleurx catheter in place. GI: Abdomen is soft, nontender, nondistended, no abdominal masses.  Abdominal ascites is noted. GU: No CVA tenderness. Testicles descended bilaterally. No testicular masses. Circumcised phallus with Foley catheter in place. Catheter draining cloudy, purulent urine. Skin: No rashes, bruises or suspicious lesions Lymph: No cervical or inguinal adenopathy Neurologic: Grossly intact, no focal deficits, moving all 4 extremities Psychiatric: Normal mood and affect   Laboratory Data:   Recent Labs  08/22/15 1322 08/23/15 0602  WBC 14.7* 24.4*  HGB 14.1 13.0  HCT 46.2 42.3    Recent Labs  08/22/15 1322 08/23/15 0602 08/23/15  1235  NA 137 133* 130*  K 5.6* 5.6* 5.8*  CL 104 105 103  CO2 25 20* 18*  GLUCOSE 132* 160* 187*  BUN 44* 51* 59*  CREATININE 2.32* 3.30* 3.64*  CALCIUM 9.3 8.9 8.7*   No results for input(s): LABPT, INR in the last 72 hours. No results for input(s): LABURIN in the last 72 hours. Results for orders placed or performed during the hospital encounter of 08/22/15  Blood culture (routine x 2)     Status: None (Preliminary result)   Collection Time: 08/22/15  4:39 PM  Result Value Ref Range Status   Specimen Description BLOOD RIGHT FOREARM  Final   Special Requests   Final    BOTTLES DRAWN AEROBIC AND ANAEROBIC  AER 6CC ANA 2CC   Culture NO GROWTH < 24 HOURS  Final   Report Status PENDING  Incomplete  Blood culture (routine x 2)     Status: None (Preliminary result)   Collection Time: 08/22/15  4:40 PM  Result Value Ref Range Status   Specimen Description BLOOD LEFT ARM  Final   Special Requests BOTTLES DRAWN AEROBIC AND ANAEROBIC  1CC  Final   Culture NO GROWTH < 24 HOURS  Final   Report Status  PENDING  Incomplete     Radiologic Imaging: Dg Chest 2 View  08/22/2015  CLINICAL DATA:  Shortness of Breath EXAM: CHEST  2 VIEW COMPARISON:  July 07, 2015 FINDINGS: There is a pleuravac catheter at the right base. There is a small pleural effusion on the right. There is generalized interstitial edema. There is no alveolar/airspace consolidation. There is cardiomegaly with pulmonary venous hypertension. No adenopathy. There is atherosclerotic calcification in the aorta. IMPRESSION: Evidence of a degree of congestive heart failure. Pleuravac type catheter right base. No pneumothorax. Electronically Signed   By: Lowella Grip III M.D.   On: 08/22/2015 14:50   US Renal  08/23/2015  CLINICAL DATA:  Acute renal failure. Urolithiasis and hydronephrosis. Chronic systolic congestive heart failure. EXAM: RENAL / URINARY TRACT ULTRASOUND COMPLETE COMPARISON:  Noncontrast CT on 06/06/2015 FINDINGS: Right Kidney: Length: 13.9 cm. A 2 cm shadowing calculus is seen in the upper pole of the right kidney. Moderate right hydronephrosis is seen with the right ureteral stent in place. This appears similar to previous CT. Left Kidney: Length: 10.1 cm. Several small less than 1 cm shadowing calculi are seen. A 1.8 cm cyst is also seen in the midpole. No definite renal mass identified. No evidence of hydronephrosis. Bladder: Empty with Foley catheter in place.  Small amount of ascites noted. IMPRESSION: Moderate right hydronephrosis with right ureteral stent in place. This is similar in appearance to recent CT. No left hydronephrosis. Bilateral nephrolithiasis. Mild ascites. Electronically Signed   By: Earle Gell M.D.   On: 08/23/2015 15:43   Previous CT scan from 05/2015 reviewed personally in addition to the above.  Impression/Assessment: 74 year old male with multiple severe comorbidities admitted with CHF exacerbation, shortness of breath, and possible urinary retention now with Foley, acute on chronic renal  failure, and pyuria.  1. Acute on chronic renal failure- likely multifactorial in the setting of diuresis for CHF exacerbation. Foley catheter now in place without improvement in his kidney function.  He does have persistent right hydronephrosis (stable from 3 months ago) secondary to a likely encrusted retained right ureteral stent. It is unclear whether or not his hydronephrosis fully resolved following the stent placement one year ago positioned no interval imaging between December 2015 and  September 2016. Unfortunately, stent exchange will not be trivial given the degree of encrustation and stones along the stent and may require staged/ multiple procedures with laser lithotripsy. Additionally, he is chronically colonized with bacteria/ yeast making his risk for sepsis quite high with very little reserve given medical comorbidites.  If kidney function fails to improve over the next few days or worsens, will recommend consideration of right percutaneous nephrostomy tube.  We will make him nothing by mouth at midnight, assess his renal function in the a.m., and assess the need for possible percutaneous nephrostomy tube tomorrow if needed.  2. Retained right ureteral stent- noncompliant with follow-up, suspect significant encrustation of the stent and new stone seen along the stent. This will ultimately need to be exchanged but do not recommend performing this acutely until his CHF and pulmonary status are optimized.    3. Bilateral nephrolithiasis- metabolically active stone former, increasing stone burden since last year.  4. Pyuria- secondary to #2 and #3. Urine culture pending.  On ceftriaxone and azithromycin.  5. Leukocytosis- rising since admission after initiation of steroids, suspect leukocytosis iatrogenic from medication use and less likely related to infection  6. Urinary retention- unclear if this was true urinary retention. Foley catheter currently in place to decompress his collecting  system which is very reasonable.  Plan:  -Plan to make patient NPO at midnight, assess his renal function in the a.m., and assess the need for possible percutaneous nephrostomy tube tomorrow if renal function continues to decompensate, will discuss the case with hospitalist and Dr. Holley Raring in the a.m. -If patient spikes fever or decompensates hemodynamically, he will need EMERGENT right percutaneous nephrostomy tube -If renal function stabilizes or improves, we will arrange for ureteroscopy, laser lithotripsy, exchange of encrusted retained stent once medical issues have been optimized, possibly during this admission -Agree with ceftriaxone for treatment of presumed urinary tract infection, follow-up culture data and adjust as needed -Consider addition of fluconazole if patient spikes fever as he's grown yeast and previous urine cultures and is likely colonized with yeast as well, currently clinically stable -Lengthy discussion with the patient regarding the need for very close follow-up and compliance.    08/23/2015, 7:22 PM  Hollice Espy,  MD   Thank you for involving me in this patient's care, I will continue to follow along. Please page with any further questions or concerns.    I spent 80 min with this patient of which greater than 50% was spent in counseling and coordination of care with the patient.

## 2015-08-23 NOTE — Progress Notes (Signed)
[facility] [place] NAME: Collin [name]    MR#:  [id number]  DATE OF BIRTH:  Nov 17, 1940  SUBJECTIVE:  CHIEF COMPLAINT:   Chief Complaint  Patient presents with  . Shortness of Breath   Still cough and shortness of breath, on O2 [place] 2L REVIEW OF SYSTEMS:  CONSTITUTIONAL: No fever, has weakness.  EYES: No blurred or double vision.  EARS, NOSE, AND THROAT: No tinnitus or ear pain.  RESPIRATORY: has cough, shortness of breath, no wheezing or hemoptysis.  CARDIOVASCULAR: No chest pain, orthopnea, edema.  GASTROINTESTINAL: No nausea, vomiting, diarrhea or abdominal pain.  GENITOURINARY: No dysuria, hematuria.  ENDOCRINE: No polyuria, nocturia,  HEMATOLOGY: No anemia, easy bruising or bleeding SKIN: No rash or lesion. MUSCULOSKELETAL: No joint pain or arthritis.   NEUROLOGIC: No tingling, numbness, weakness.  PSYCHIATRY: No anxiety or depression.   DRUG ALLERGIES:   Allergies  Allergen Reactions  . Lisinopril Swelling    Pt reports throat swelling.  . Metoprolol     Other reaction(s): Other (See Comments) Caused ringing in ears    VITALS:  Blood pressure 126/89, pulse 106, temperature 97.5 F (36.4 C), temperature source Oral, resp. rate 22, height 6\' 2"  (1.88 m), weight 86.274 kg (190 lb 3.2 oz), SpO2 95 %.  PHYSICAL EXAMINATION:  GENERAL:  74 y.o.-year-old patient lying in the bed with no acute distress.  EYES: Pupils equal, round, reactive to light and accommodation. No scleral icterus. Extraocular muscles intact.  HEENT: Head atraumatic, normocephalic. Oropharynx and nasopharynx clear.  NECK:  Supple, no jugular venous distention. No thyroid enlargement, no tenderness.  LUNGS: Normal breath sounds bilaterally, no wheezing, but has rales. No use of accessory muscles of respiration.  CARDIOVASCULAR: S1, S2 normal. No murmurs, rubs, or gallops.  ABDOMEN: Soft, nontender, nondistended. Bowel sounds present. No organomegaly or  mass.  EXTREMITIES: No pedal edema, cyanosis, or clubbing.  NEUROLOGIC: Cranial nerves II through XII are intact. Muscle strength 4/5 in all extremities. Sensation intact. Gait not checked.  PSYCHIATRIC: The patient is alert and oriented x 3.  SKIN: No obvious rash, lesion, or ulcer.    LABORATORY PANEL:   CBC  Recent Labs Lab 08/23/15 0602  WBC 24.4*  HGB 13.0  HCT 42.3  PLT 215   ------------------------------------------------------------------------------------------------------------------  Chemistries   Recent Labs Lab 08/23/15 1235  NA 130*  K 5.8*  CL 103  CO2 18*  GLUCOSE 187*  BUN 59*  CREATININE 3.64*  CALCIUM 8.7*   ------------------------------------------------------------------------------------------------------------------  Cardiac Enzymes  Recent Labs Lab 08/23/15 0602  TROPONINI 0.04*   ------------------------------------------------------------------------------------------------------------------  RADIOLOGY:  Dg Chest 2 View  08/22/2015  CLINICAL DATA:  Shortness of Breath EXAM: CHEST  2 VIEW COMPARISON:  July 07, 2015 FINDINGS: There is a pleuravac catheter at the right base. There is a small pleural effusion on the right. There is generalized interstitial edema. There is no alveolar/airspace consolidation. There is cardiomegaly with pulmonary venous hypertension. No adenopathy. There is atherosclerotic calcification in the aorta. IMPRESSION: Evidence of a degree of congestive heart failure. Pleuravac type catheter right base. No pneumothorax. Electronically Signed   By: Lowella Grip III M.D.   On: 08/22/2015 14:50    EKG:   Orders placed or performed during the hospital encounter of 08/22/15  . ED EKG within 10 minutes  . ED EKG within 10 minutes    ASSESSMENT AND PLAN:   1. Acute on chronic systolic CHF with ejection fraction 25%.  Hold Lasix  due to renal failure, continue oxygen therapy and NEB.  2. Elevated troponin  suspected demand ischemia, , Continue aspirin as well as Coreg.  3. Hyperkalemia.  The patient did not take Kayexalate this morning due to nausea,  potassium is higher at 5.8.  Given Kayexalate just now, will give calcium gluconate, NovoLog and D50. Follow-up BMP tonight and tomorrow morning.  4. Acute on chronic renal failure,  suspected acute urinary retention, placed Foley catheter. Renal function is worsening. Hold Lasix, get nephrology consult and follow-up BMP. Get renal ultrasound.  5. COPD exacerbation due to acute bronchitis versus pneumonia,  Continue prednisone, inhalation therapy with duleara, albuterol , tiotropium, Rocephin and Zithromax.  6. Tobacco abuse. Initiate patient on nicotine replacement therapy. Discussed this patient for 3 minutes  7. Acute pyelonephritis, initiate patient on Rocephin,  follow-up urinary cultures.      All the records are reviewed and case discussed with Care Management/Social Workerr. Management plans discussed with the patient, his wife and they are in agreement.  CODE STATUS: Full code  TOTAL TIME TAKING CARE OF THIS PATIENT: 48 minutes.  Greater than 50% time was spent on coordination of care and face-to-face counseling.  POSSIBLE D/C IN 5 DAYS, DEPENDING ON CLINICAL CONDITION.   Demetrios Loll M.D on 08/23/2015 at 2:25 PM  Between 7am to 6pm - Pager - [phone]  After 6pm go to [email or web address] - password [email or web address] [facility]  Office  [phone]  CC: Primary care physician; [name], MD

## 2015-08-23 NOTE — Consult Note (Signed)
[facility]  CARDIOLOGY CONSULT NOTE  Patient ID: JEFFEREY WENIGER MRN: [id number] DOB/AGE: December 08, 1940 74 y.o.  Admit date: 08/22/2015 Referring Physician Dr. Bridgett Larsson Primary Physician Dr. Netty Starring Primary Cardiologist Dr. Ubaldo Glassing Reason for Consultation Pleural effusion  HPI: Patient is a 74 year old male with history of chronic congestive heart failure with systolic heart failure with an echocardiogram showing ejection fraction of 25%.  He has had a history recently of recurrent right pleural effusion.  This is been treated with thoracentesis at least x3.  There was always recurrent right pleural effusion.  He currently has a PleurX chest tube in place which is draining at home by him and his wife.  He states that they drain between 500- 1000 mL of fluid pretty much every day.Marland Kitchen He was admitted with urologic problems status post CE urinary stent that became ineffective.  He has no chest pain but complains of shortness of breath weakness and fatigue.   Laboratories on admission revealed acute on chronic renal insufficiency. His serum creatinine was 3.64 today.  GFR is approximately 15. Chest x-ray revealed mild systolic heart failure.  There was a small right pleural effusion.  ROS Review of Systems - History obtained from chart review and the patient General ROS: positive for  - fatigue Respiratory ROS: positive for - shortness of breath Cardiovascular ROS: positive for - dyspnea on exertion Gastrointestinal ROS: no abdominal pain, change in bowel habits, or black or bloody stools Musculoskeletal ROS: negative Neurological ROS: no TIA or stroke symptoms   Past Medical History  Diagnosis Date  . COPD (chronic obstructive pulmonary disease) ([place])   . Chronic systolic CHF (congestive heart failure) ([place])   . Kidney stone   . Macular degeneration   . HLD (hyperlipidemia)   . Hypertensive retinopathy   . HTN (hypertension)   . GERD (gastroesophageal reflux  disease)     Family History  Problem Relation Age of Onset  . Heart attack Mother   . Stroke Mother   . Cancer Father   . COPD Father   . Heart attack Father     Social History   Social History  . Marital Status: Married    Spouse Name: N/A  . Number of Children: N/A  . Years of Education: N/A   Occupational History  . Not on file.   Social History Main Topics  . Smoking status: Current Every Day Smoker -- 0.25 packs/day for 45 years    Types: Cigarettes  . Smokeless tobacco: Never Used  . Alcohol Use: No  . Drug Use: No  . Sexual Activity: Not on file   Other Topics Concern  . Not on file   Social History Narrative    Past Surgical History  Procedure Laterality Date  . Abdominal aortic aneurysm repair    . Hernia repair    . Kidney stone surgery    . Chest tube insertion Right 07/07/2015    Procedure: INSERTION PLEURAL DRAINAGE CATHETER;  Surgeon: Nestor Lewandowsky, MD;  Location: [facility] ORS;  Service: Thoracic;  Laterality: Right;     Prescriptions prior to admission  Medication Sig Dispense Refill Last Dose  . ADVAIR DISKUS 100-50 MCG/DOSE AEPB Inhale 1 puff into the lungs 2 (two) times daily.   08/21/2015 at Unknown time  . albuterol (PROVENTIL HFA;VENTOLIN HFA) 108 (90 BASE) MCG/ACT inhaler Inhale 2 puffs into the lungs every 6 (six) hours as needed for wheezing or shortness of breath.    08/22/2015 at Unknown time  .  ALPRAZolam (XANAX) 0.25 MG tablet Take 0.25 mg by mouth at bedtime as needed. For sleep   prn  . aspirin 81 MG tablet Take 81 mg by mouth daily as needed.    08/22/2015 at Unknown time  . carvedilol (COREG) 6.25 MG tablet Take 6.25 mg by mouth 2 (two) times daily with a meal.    08/21/2015 at 1900  . furosemide (LASIX) 20 MG tablet Take 1 tablet (20 mg total) by mouth daily. (Patient taking differently: Take 20-40 mg by mouth See admin instructions. Take 2 tablets (40mg ) orally once a day (in the morning), and take 1 tablet orally once a day at bedtime.)  30 tablet 1 08/22/2015 at Unknown time  . Multiple Vitamin (MULTIVITAMIN) tablet Take 1 tablet by mouth daily.   08/21/2015 at Unknown time  . nitroGLYCERIN (NITROSTAT) 0.4 MG SL tablet Take 0.4 mg by mouth every 5 (five) minutes x 3 doses as needed. For chest pain. If no relief call MD or go to emergency room.   prn  . omeprazole (PRILOSEC) 20 MG capsule Take 20 mg by mouth 2 (two) times daily before a meal.   08/22/2015 at Unknown time  . tiotropium (SPIRIVA HANDIHALER) 18 MCG inhalation capsule Place 1 capsule (18 mcg total) into inhaler and inhale daily. 30 capsule 0 08/21/2015 at Unknown time  . oxyCODONE-acetaminophen (PERCOCET/ROXICET) 5-325 MG tablet Take 1-2 tablets by mouth every 4 (four) hours as needed for moderate pain. (Patient not taking: Reported on 07/15/2015) 30 tablet 0 Not Taking    Physical Exam: Blood pressure 136/90, pulse 98, temperature 97.5 F (36.4 C), temperature source Oral, resp. rate 22, height 6\' 2"  (1.88 m), weight 86.274 kg (190 lb 3.2 oz), SpO2 95 %.    General appearance: alert and cooperative Resp: dullness to percussion RLL Chest wall: no tenderness Cardio: regular rate and rhythm GI: soft, non-tender; bowel sounds normal; no masses,  no organomegaly Extremities: extremities normal, atraumatic, no cyanosis or edema Neurologic: Grossly normal Labs:   Lab Results  Component Value Date   WBC 24.4* 08/23/2015   HGB 13.0 08/23/2015   HCT 42.3 08/23/2015   MCV 75.0* 08/23/2015   PLT 215 08/23/2015    Recent Labs Lab 08/23/15 1842  NA 128*  K 5.9*  CL 101  CO2 18*  BUN 56*  CREATININE 3.79*  CALCIUM 8.4*  GLUCOSE 145*   Lab Results  Component Value Date   CKMB 4.2 12/23/2014   TROPONINI 0.04* 08/23/2015      Radiology:   Mild pulmonary edema with no  Pneumothorax. PleurX catheter on the right base with a small pleural effusion EKG:   Normal sinus rhythm with no ischemia  ASSESSMENT AND PLAN:   Patient is a 74 year old male with history  of systolic heart failure with an ejection fraction of 25%.  He has had recurrent right pleural effusion requiring multiple thoracentesis and currently has a PleurX chest tube in place.  He is admitted with acute on chronic renal insufficiency secondary to failure of her renal stent.  He continues to have significant drainage from his right PleurX chest tube of approximately [phone] mL a day.  Will need to discuss consideration for pleurodesis of some tight to assist with his persistent effusion.  Will discuss with Dr. Faith Rogue to consider  Teodoro Spray MD, [facility] 08/23/2015, 8:17 PM

## 2015-08-23 NOTE — Progress Notes (Signed)
Pt had one vomiting episode on shift, Zofran administered, will continue to monitor.

## 2015-08-23 NOTE — Progress Notes (Signed)
Patient currently has an initial appointment scheduled at the [facility] on September 15, 2015. Thank you.

## 2015-08-23 NOTE — Care Management (Signed)
Patient presents from home with acute CHF.  Patient lives at home with his wife.  Patient obtains his medications at [facility] in [place].  Patient has a cane and elevated toilet seat in the home.  Patient is on chronic O2 provided by [employer].  Patient still drives, but wife primarily provides transportation.  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  Corene Cornea from [facility] notified of admission.  RNCM following for discharge

## 2015-08-23 NOTE — Progress Notes (Signed)
Pt complaining of pain rt ribs/back, chronic for him.  States he threw up his pain medication earlier to.  Page out to Dr. Ether Griffins.

## 2015-08-23 NOTE — Progress Notes (Signed)
2nd page out to Dr. Ether Griffins for pain meds

## 2015-08-23 NOTE — Clinical Documentation Improvement (Signed)
Hospitalist  Can the diagnosis of CKD be further specified?   CKD Stage I - GFR greater than or equal to 90  CKD Stage II - GFR 60-89  CKD Stage III - GFR 30-59  CKD Stage IV - GFR 15-29  CKD Stage V - GFR < 15  ESRD (End Stage Renal Disease)  Other condition  Unable to clinically determine   Supporting Information: : (risk factors, signs and symptoms, diagnostics, treatment) BUN 44 and 51; Creatinine 2.32 and 3.03; and GFR 26 and 17  Please exercise your independent, professional judgment when responding. A specific answer is not anticipated or expected.   Thank You, Day [phone]

## 2015-08-24 ENCOUNTER — Other Ambulatory Visit: Payer: Self-pay | Admitting: Radiology

## 2015-08-24 DIAGNOSIS — N132 Hydronephrosis with renal and ureteral calculous obstruction: Secondary | ICD-10-CM

## 2015-08-24 DIAGNOSIS — N1330 Unspecified hydronephrosis: Secondary | ICD-10-CM

## 2015-08-24 LAB — PROTEIN ELECTROPHORESIS, SERUM
A/G RATIO SPE: 1.1 (ref 0.7–1.7)
ALBUMIN ELP: 2.8 g/dL — AB (ref 2.9–4.4)
ALPHA-2-GLOBULIN: 0.8 g/dL (ref 0.4–1.0)
Alpha-1-Globulin: 0.4 g/dL (ref 0.0–0.4)
BETA GLOBULIN: 0.9 g/dL (ref 0.7–1.3)
Gamma Globulin: 0.5 g/dL (ref 0.4–1.8)
Globulin, Total: 2.6 g/dL (ref 2.2–3.9)
Total Protein ELP: 5.4 g/dL — ABNORMAL LOW (ref 6.0–8.5)

## 2015-08-24 LAB — CBC
HEMATOCRIT: 40.1 % (ref 40.0–52.0)
HEMOGLOBIN: 12.4 g/dL — AB (ref 13.0–18.0)
MCH: 23.2 pg — AB (ref 26.0–34.0)
MCHC: 30.9 g/dL — AB (ref 32.0–36.0)
MCV: 75.2 fL — ABNORMAL LOW (ref 80.0–100.0)
Platelets: 196 10*3/uL (ref 150–440)
RBC: 5.33 MIL/uL (ref 4.40–5.90)
RDW: 20.2 % — ABNORMAL HIGH (ref 11.5–14.5)
WBC: 20.6 10*3/uL — ABNORMAL HIGH (ref 3.8–10.6)

## 2015-08-24 LAB — BASIC METABOLIC PANEL
ANION GAP: 11 (ref 5–15)
ANION GAP: 15 (ref 5–15)
BUN: 66 mg/dL — ABNORMAL HIGH (ref 6–20)
BUN: 71 mg/dL — ABNORMAL HIGH (ref 6–20)
CO2: 20 mmol/L — AB (ref 22–32)
CO2: 22 mmol/L (ref 22–32)
Calcium: 8.6 mg/dL — ABNORMAL LOW (ref 8.9–10.3)
Calcium: 8.6 mg/dL — ABNORMAL LOW (ref 8.9–10.3)
Chloride: 100 mmol/L — ABNORMAL LOW (ref 101–111)
Chloride: 96 mmol/L — ABNORMAL LOW (ref 101–111)
Creatinine, Ser: 4.28 mg/dL — ABNORMAL HIGH (ref 0.61–1.24)
Creatinine, Ser: 4.62 mg/dL — ABNORMAL HIGH (ref 0.61–1.24)
GFR calc Af Amer: 15 mL/min — ABNORMAL LOW (ref >60)
GFR calc Af Amer: 13 mL/min — ABNORMAL LOW (ref >60)
GFR calc non Af Amer: 13 mL/min — ABNORMAL LOW (ref >60)
GFR, EST NON AFRICAN AMERICAN: 11 mL/min — AB (ref >60)
GLUCOSE: 85 mg/dL (ref 65–99)
GLUCOSE: 105 mg/dL — AB (ref 65–99)
POTASSIUM: 6.2 mmol/L — AB (ref 3.5–5.1)
POTASSIUM: 5.1 mmol/L (ref 3.5–5.1)
Sodium: 131 mmol/L — ABNORMAL LOW (ref 135–145)
Sodium: 133 mmol/L — ABNORMAL LOW (ref 135–145)

## 2015-08-24 LAB — PROTIME-INR
INR: 1.27
Prothrombin Time: 16 seconds — ABNORMAL HIGH (ref 11.4–15.0)

## 2015-08-24 MED ORDER — HEPARIN SODIUM (PORCINE) 5000 UNIT/ML IJ SOLN
5000.0000 [IU] | Freq: Three times a day (TID) | INTRAMUSCULAR | Status: AC
  Administered 2015-08-24 (×2): 5000 [IU] via SUBCUTANEOUS
  Filled 2015-08-24 (×2): qty 1

## 2015-08-24 MED ORDER — DEXTROSE 50 % IV SOLN
25.0000 mL | Freq: Once | INTRAVENOUS | Status: AC
  Administered 2015-08-24: 25 mL via INTRAVENOUS
  Filled 2015-08-24: qty 50

## 2015-08-24 MED ORDER — LACTULOSE 10 GM/15ML PO SOLN: 20.0000 g | Freq: Two times a day (BID) | ORAL | Status: DC | PRN

## 2015-08-24 MED ORDER — INSULIN ASPART 100 UNIT/ML ~~LOC~~ SOLN
10.0000 [IU] | Freq: Once | SUBCUTANEOUS | Status: AC
  Administered 2015-08-24: 10 [IU] via SUBCUTANEOUS
  Filled 2015-08-24: qty 10

## 2015-08-24 MED ORDER — SODIUM CHLORIDE 0.9 % IV SOLN
1.0000 g | Freq: Once | INTRAVENOUS | Status: AC
  Administered 2015-08-24: 1 g via INTRAVENOUS
  Filled 2015-08-24: qty 10

## 2015-08-24 MED ORDER — SODIUM POLYSTYRENE SULFONATE 15 GM/60ML PO SUSP
30.0000 g | Freq: Once | ORAL | Status: AC
  Administered 2015-08-24: 30 g via ORAL
  Filled 2015-08-24: qty 120

## 2015-08-24 MED ORDER — HEPARIN SODIUM (PORCINE) 5000 UNIT/ML IJ SOLN: 5000.0000 [IU] | Freq: Three times a day (TID) | INTRAMUSCULAR | Status: DC

## 2015-08-24 MED ORDER — PREDNISONE 20 MG PO TABS
40.0000 mg | ORAL_TABLET | Freq: Every day | ORAL | Status: DC
  Administered 2015-08-25: 40 mg via ORAL
  Filled 2015-08-24: qty 2

## 2015-08-24 NOTE — Plan of Care (Signed)
Problem: Pain Managment: Goal: General experience of comfort will improve Outcome: Not Progressing Prn meds  Problem: Physical Regulation: Goal: Will remain free from infection Outcome: Not Progressing IV antibiotics

## 2015-08-24 NOTE — Care Management Important Message (Signed)
Important Message  Patient Details  Name: MIRO MARSMAN MRN: [id number] Date of Birth: 1940/11/10   Medicare Important Message Given:  Yes    Juliann Pulse A Captola Teschner 08/24/2015, 9:33 AM

## 2015-08-24 NOTE — Progress Notes (Signed)
Urology Consult Follow Up  Subjective: 74 year old male with a retained right ureteral stent who continues to have worsening renal function.  His current Cr is 4.28, up from his baseline 1.54 in October and yesterday's value of 3.79.  His potassium is also continuing to rise.  It is now 6.2.  His urine output is minimal and cloudy.   He is complaining of right sided flank pain this morning.    Anti-infectives: Anti-infectives    Start     Dose/Rate Route Frequency Ordered Stop   08/23/15 1800  cefTRIAXone (ROCEPHIN) 1 g in dextrose 5 % 50 mL IVPB     1 g 100 mL/hr over 30 Minutes Intravenous Every 24 hours 08/22/15 1752     08/22/15 1630  cefTRIAXone (ROCEPHIN) 1 g in dextrose 5 % 50 mL IVPB  Status:  Discontinued     1 g 100 mL/hr over 30 Minutes Intravenous Every 24 hours 08/22/15 1624 08/22/15 1752   08/22/15 1630  azithromycin (ZITHROMAX) 500 mg in dextrose 5 % 250 mL IVPB     500 mg 250 mL/hr over 60 Minutes Intravenous Every 24 hours 08/22/15 1624     08/22/15 1600  cefTRIAXone (ROCEPHIN) 1 g in dextrose 5 % 50 mL IVPB     1 g 100 mL/hr over 30 Minutes Intravenous  Once 08/22/15 1553 08/22/15 1802      Current Facility-Administered Medications  Medication Dose Route Frequency Provider Last Rate Last Dose  . acetaminophen (TYLENOL) tablet 650 mg  650 mg Oral Q6H PRN Theodoro Grist, MD       Or  . acetaminophen (TYLENOL) suppository 650 mg  650 mg Rectal Q6H PRN Theodoro Grist, MD      . albuterol (PROVENTIL) (2.5 MG/3ML) 0.083% nebulizer solution 2.5 mg  2.5 mg Nebulization Q6H PRN Theodoro Grist, MD      . ALPRAZolam Duanne Moron) tablet 0.25 mg  0.25 mg Oral QHS PRN Theodoro Grist, MD   0.25 mg at 08/23/15 2256  . aspirin EC tablet 81 mg  81 mg Oral Daily Theodoro Grist, MD   81 mg at 08/23/15 1254  . azithromycin (ZITHROMAX) 500 mg in dextrose 5 % 250 mL IVPB  500 mg Intravenous Q24H Theodoro Grist, MD   500 mg at 08/23/15 1742  . calcium gluconate 1 g in sodium chloride 0.9 % 100 mL IVPB   1 g Intravenous Once Demetrios Loll, MD   1 g at 08/24/15 0807  . carvedilol (COREG) tablet 12.5 mg  12.5 mg Oral BID WC Demetrios Loll, MD   12.5 mg at 08/23/15 1655  . cefTRIAXone (ROCEPHIN) 1 g in dextrose 5 % 50 mL IVPB  1 g Intravenous Q24H Theodoro Grist, MD   1 g at 08/23/15 1656  . docusate sodium (COLACE) capsule 100 mg  100 mg Oral BID Theodoro Grist, MD   100 mg at 08/23/15 2250  . HYDROcodone-acetaminophen (NORCO/VICODIN) 5-325 MG per tablet 1-2 tablet  1-2 tablet Oral Q4H PRN Theodoro Grist, MD   1 tablet at 08/23/15 0514  . mometasone-formoterol (DULERA) 100-5 MCG/ACT inhaler 2 puff  2 puff Inhalation BID Theodoro Grist, MD   2 puff at 08/23/15 2249  . nicotine polacrilex (NICORETTE) gum 2 mg  2 mg Oral PRN Theodoro Grist, MD      . nitroGLYCERIN (NITROGLYN) 2 % ointment 1 inch  1 inch Topical 4 times per day Theodoro Grist, MD   1 inch at 08/24/15 0649  . nitroGLYCERIN (NITROSTAT) SL tablet  0.4 mg  0.4 mg Sublingual Q5 min PRN Theodoro Grist, MD      . ondansetron (ZOFRAN) tablet 4 mg  4 mg Oral Q6H PRN Theodoro Grist, MD       Or  . ondansetron (ZOFRAN) injection 4 mg  4 mg Intravenous Q6H PRN Theodoro Grist, MD   4 mg at 08/23/15 1422  . oxyCODONE-acetaminophen (PERCOCET/ROXICET) 5-325 MG per tablet 1-2 tablet  1-2 tablet Oral Q4H PRN Theodoro Grist, MD   2 tablet at 08/24/15 [phone]  . pantoprazole (PROTONIX) EC tablet 40 mg  40 mg Oral Daily Theodoro Grist, MD   40 mg at 08/22/15 1745  . predniSONE (DELTASONE) tablet 50 mg  50 mg Oral Q breakfast Theodoro Grist, MD   50 mg at 08/22/15 1700  . sodium chloride 0.9 % injection 3 mL  3 mL Intravenous Q12H Theodoro Grist, MD   3 mL at 08/24/15 1000  . tiotropium (SPIRIVA) inhalation capsule 18 mcg  18 mcg Inhalation Daily Theodoro Grist, MD   18 mcg at 08/23/15 0933     Objective: Vital signs in last 24 hours: Temp:  [97.5 F (36.4 C)-98.3 F (36.8 C)] 98.3 F (36.8 C) (12/13 0542) Pulse Rate:  [92-106] 94 (12/13 0542) Resp:  [18] 18 (12/13 0542) BP:  (121-136)/(79-90) 128/81 mmHg (12/13 0542) SpO2:  [94 %-96 %] 94 % (12/13 0542) Weight:  [194 lb 1.6 oz (88.043 kg)] 194 lb 1.6 oz (88.043 kg) (12/13 0629)  Intake/Output from previous day: 12/12 0701 - 12/13 0700 In: 1130 [P.O.:720; IV Piggyback:410] Out: 575 [Urine:575] Intake/Output this shift:     Physical Exam Constitutional: Well nourished. Alert and oriented, No acute distress. HEENT: [name] AT, moist mucus membranes. Trachea midline, no masses. Cardiovascular: No clubbing, cyanosis, or edema. Respiratory: Normal respiratory effort, no increased work of breathing. GI: Abdomen is soft, non tender, non distended, no abdominal masses. Liver and spleen not palpable.  No hernias appreciated.  Stool sample for occult testing is not indicated.   GU: Right CVA tenderness.  No bladder fullness or masses.   Skin: No rashes, bruises or suspicious lesions. Lymph: No cervical or inguinal adenopathy. Neurologic: Grossly intact, no focal deficits, moving all 4 extremities. Psychiatric: Normal mood and affect.  Lab Results:   Recent Labs  08/23/15 0602 08/24/15 0520  WBC 24.4* 20.6*  HGB 13.0 12.4*  HCT 42.3 40.1  PLT 215 196   BMET  Recent Labs  08/23/15 1842 08/24/15 0520  NA 128* 131*  K 5.9* 6.2*  CL 101 100*  CO2 18* 20*  GLUCOSE 145* 85  BUN 56* 66*  CREATININE 3.79* 4.28*  CALCIUM 8.4* 8.6*   PT/INR  Recent Labs  08/24/15 0520  LABPROT 16.0*  INR 1.27   ABG No results for input(s): PHART, HCO3 in the last 72 hours.  Invalid input(s): PCO2, PO2  Studies/Results: Dg Chest 2 View  08/22/2015  CLINICAL DATA:  Shortness of Breath EXAM: CHEST  2 VIEW COMPARISON:  July 07, 2015 FINDINGS: There is a pleuravac catheter at the right base. There is a small pleural effusion on the right. There is generalized interstitial edema. There is no alveolar/airspace consolidation. There is cardiomegaly with pulmonary venous hypertension. No adenopathy. There is  atherosclerotic calcification in the aorta. IMPRESSION: Evidence of a degree of congestive heart failure. Pleuravac type catheter right base. No pneumothorax. Electronically Signed   By: Lowella Grip III M.D.   On: 08/22/2015 14:50   US Renal  08/23/2015  CLINICAL DATA:  Acute renal failure. Urolithiasis and hydronephrosis. Chronic systolic congestive heart failure. EXAM: RENAL / URINARY TRACT ULTRASOUND COMPLETE COMPARISON:  Noncontrast CT on 06/06/2015 FINDINGS: Right Kidney: Length: 13.9 cm. A 2 cm shadowing calculus is seen in the upper pole of the right kidney. Moderate right hydronephrosis is seen with the right ureteral stent in place. This appears similar to previous CT. Left Kidney: Length: 10.1 cm. Several small less than 1 cm shadowing calculi are seen. A 1.8 cm cyst is also seen in the midpole. No definite renal mass identified. No evidence of hydronephrosis. Bladder: Empty with Foley catheter in place.  Small amount of ascites noted. IMPRESSION: Moderate right hydronephrosis with right ureteral stent in place. This is similar in appearance to recent CT. No left hydronephrosis. Bilateral nephrolithiasis. Mild ascites. Electronically Signed   By: Earle Gell M.D.   On: 08/23/2015 15:43     Assessment: Patient with acute on chronic renal failure with a retained right ureteral stent who is scheduled for a right percutaneous nephrostomy tube placement this morning, he did receive SQ Heparin this am.   IR is being consulted regarding postponing the placement this am vs. tomorrow.  Patient is currently receiving ceftriaxone and azithromycin.  Urine culture is still pending.  Blood cultures are negative.   Plan: Awaiting decision on proceeding with the placement of the percutaneous nephrostomy tube placement this am.  It is an urgent need as the patient's renal function continues to decline significantly.  Continue the ceftriaxone and azithromycin until culture results are available, adjust as  appropriate.    CC:     LOS: 2 days    [facility] 08/24/2015     I have seen and examined the patient, labs/ imaging reviewed and discussed his management with Zara Council.  I reviewed the PAs note and agree with the documented findings and plan of care. Case was personally discussed this morning with Dr. Holley Raring from nephrology as well as Dr. Reesa Chew from interventional radiology. Given his worsening renal function and rising potassium, it is reasonable to proceed with right nephrostomy tube placement to eliminate urinary obstruction as well as a factor contributing to his acute on chronic renal failure. Unfortunately, due to scheduling issues as well as administration of subcutaneous heparin this morning, the case will be deferred tomorrow. Patient should be nothing by mouth at midnight and heparin held in the morning.  I spent 30 min with this patient of which greater than 50% was spent in counseling and coordination of care with the patient.    Hollice Espy, MD

## 2015-08-24 NOTE — Progress Notes (Signed)
[facility]  ROUNDING NOTE   Subjective:  Potassium worse this a.m. up to 6.2. Creatinine also up to 4.28. Case discussed with Dr. Erlene Quan. Patient being considered for percutaneous nephrostomy as he appears to have an encrusted ureteral stent.   Objective:  Vital signs in last 24 hours:  Temp:  [97.6 F (36.4 C)-98.3 F (36.8 C)] 98.3 F (36.8 C) (12/13 0542) Pulse Rate:  [92-98] 94 (12/13 0542) Resp:  [18] 18 (12/13 0542) BP: (121-136)/(79-90) 128/81 mmHg (12/13 0542) SpO2:  [94 %-96 %] 94 % (12/13 0542) Weight:  [88.043 kg (194 lb 1.6 oz)] 88.043 kg (194 lb 1.6 oz) (12/13 0629)  Weight change: 4.128 kg (9 lb 1.6 oz) Filed Weights   08/22/15 1255 08/23/15 0500 08/24/15 0629  Weight: 83.915 kg (185 lb) 86.274 kg (190 lb 3.2 oz) 88.043 kg (194 lb 1.6 oz)    Intake/Output: I/O last 3 completed shifts: In: 1130 [P.O.:720; IV Piggyback:410] Out: 1125 [Urine:1125]   Intake/Output this shift:  Total I/O In: 240 [P.O.:240] Out: -   Physical Exam: General: NAD, resting in bed  Head: Normocephalic, atraumatic. Moist oral mucosal membranes  Eyes: Anicteric  Neck: Supple, trachea midline  Lungs:  Basilar rales, normal effort  Heart: S1S2 no rubs  Abdomen:  Soft, nontender, BS present   Extremities: trace peripheral edema.  Neurologic: Nonfocal, moving all four extremities  Skin: No lesions       Basic Metabolic Panel:  Recent Labs Lab 08/22/15 1322 08/23/15 0602 08/23/15 1235 08/23/15 1842 08/24/15 0520  NA 137 133* 130* 128* 131*  K 5.6* 5.6* 5.8* 5.9* 6.2*  CL 104 105 103 101 100*  CO2 25 20* 18* 18* 20*  GLUCOSE 132* 160* 187* 145* 85  BUN 44* 51* 59* 56* 66*  CREATININE 2.32* 3.30* 3.64* 3.79* 4.28*  CALCIUM 9.3 8.9 8.7* 8.4* 8.6*    Liver Function Tests: No results for input(s): AST, ALT, ALKPHOS, BILITOT, PROT, ALBUMIN in the last 168 hours. No results for input(s): LIPASE, AMYLASE in the last 168 hours. No results for input(s):  AMMONIA in the last 168 hours.  CBC:  Recent Labs Lab 08/22/15 1322 08/23/15 0602 08/24/15 0520  WBC 14.7* 24.4* 20.6*  HGB 14.1 13.0 12.4*  HCT 46.2 42.3 40.1  MCV 75.3* 75.0* 75.2*  PLT 253 215 196    Cardiac Enzymes:  Recent Labs Lab 08/22/15 1322 08/22/15 1950 08/23/15 0120 08/23/15 0602  TROPONINI 0.13* 0.04* 0.04* 0.04*    BNP: Invalid input(s): POCBNP  CBG: No results for input(s): GLUCAP in the last 168 hours.  Microbiology: Results for orders placed or performed during the hospital encounter of 08/22/15  Blood culture (routine x 2)     Status: None (Preliminary result)   Collection Time: 08/22/15  4:39 PM  Result Value Ref Range Status   Specimen Description BLOOD RIGHT FOREARM  Final   Special Requests   Final    BOTTLES DRAWN AEROBIC AND ANAEROBIC  AER 6CC ANA 2CC   Culture NO GROWTH 2 DAYS  Final   Report Status PENDING  Incomplete  Blood culture (routine x 2)     Status: None (Preliminary result)   Collection Time: 08/22/15  4:40 PM  Result Value Ref Range Status   Specimen Description BLOOD LEFT ARM  Final   Special Requests BOTTLES DRAWN AEROBIC AND ANAEROBIC  1CC  Final   Culture NO GROWTH 2 DAYS  Final   Report Status PENDING  Incomplete  Urine culture  Status: None (Preliminary result)   Collection Time: 08/23/15  3:55 PM  Result Value Ref Range Status   Specimen Description URINE, RANDOM  Final   Special Requests NONE  Final   Culture NO GROWTH <24 HRS  Final   Report Status PENDING  Incomplete    Coagulation Studies:  Recent Labs  08/24/15 0520  LABPROT 16.0*  INR 1.27    Urinalysis:  Recent Labs  08/22/15 1322 08/23/15 1555  COLORURINE YELLOW* YELLOW*  LABSPEC 1.012 1.015  PHURINE 6.0 5.0  GLUCOSEU NEGATIVE NEGATIVE  HGBUR 3+* 2+*  BILIRUBINUR NEGATIVE NEGATIVE  KETONESUR NEGATIVE NEGATIVE  PROTEINUR 100* 100*  NITRITE NEGATIVE NEGATIVE  LEUKOCYTESUR 3+* 2+*      Imaging: Dg Chest 2 View  08/22/2015   CLINICAL DATA:  Shortness of Breath EXAM: CHEST  2 VIEW COMPARISON:  July 07, 2015 FINDINGS: There is a pleuravac catheter at the right base. There is a small pleural effusion on the right. There is generalized interstitial edema. There is no alveolar/airspace consolidation. There is cardiomegaly with pulmonary venous hypertension. No adenopathy. There is atherosclerotic calcification in the aorta. IMPRESSION: Evidence of a degree of congestive heart failure. Pleuravac type catheter right base. No pneumothorax. Electronically Signed   By: Lowella Grip III M.D.   On: 08/22/2015 14:50   US Renal  08/23/2015  CLINICAL DATA:  Acute renal failure. Urolithiasis and hydronephrosis. Chronic systolic congestive heart failure. EXAM: RENAL / URINARY TRACT ULTRASOUND COMPLETE COMPARISON:  Noncontrast CT on 06/06/2015 FINDINGS: Right Kidney: Length: 13.9 cm. A 2 cm shadowing calculus is seen in the upper pole of the right kidney. Moderate right hydronephrosis is seen with the right ureteral stent in place. This appears similar to previous CT. Left Kidney: Length: 10.1 cm. Several small less than 1 cm shadowing calculi are seen. A 1.8 cm cyst is also seen in the midpole. No definite renal mass identified. No evidence of hydronephrosis. Bladder: Empty with Foley catheter in place.  Small amount of ascites noted. IMPRESSION: Moderate right hydronephrosis with right ureteral stent in place. This is similar in appearance to recent CT. No left hydronephrosis. Bilateral nephrolithiasis. Mild ascites. Electronically Signed   By: Earle Gell M.D.   On: 08/23/2015 15:43     Medications:     . aspirin EC  81 mg Oral Daily  . azithromycin  500 mg Intravenous Q24H  . carvedilol  12.5 mg Oral BID WC  . cefTRIAXone (ROCEPHIN)  IV  1 g Intravenous Q24H  . docusate sodium  100 mg Oral BID  . heparin  5,000 Units Subcutaneous 3 times per day  . mometasone-formoterol  2 puff Inhalation BID  . nitroGLYCERIN  1 inch Topical  4 times per day  . pantoprazole  40 mg Oral Daily  . predniSONE  50 mg Oral Q breakfast  . sodium chloride  3 mL Intravenous Q12H  . tiotropium  18 mcg Inhalation Daily   acetaminophen **OR** acetaminophen, albuterol, ALPRAZolam, HYDROcodone-acetaminophen, lactulose, nicotine polacrilex, nitroGLYCERIN, ondansetron **OR** ondansetron (ZOFRAN) IV, oxyCODONE-acetaminophen  Assessment/ Plan:  74 y.o. male with hypertension, nephrolithiasis, COPD, GERD, anxiety, AAA repair, recurrent right pleural effusions  1.  Acute renal failure/CKD stage III baseline EGFR 48/Proteinuria/hydronephrosis.   -  Hydronephrosis could certainly be resulting in some acute renal failure now though we suspect that the hydronephrosis has been quite chronic in nature. Percutaneous nephrostomy being considered per Dr. Erlene Quan. It appears the patient has eaten today. Renal function appears to be worsening at the moment.  We may need to consider temporary dialysis if this continues to worsen.  2.  Hypertension:  Blood pressure accetpable, continue coreg.   3. Hyperkalemia. Potassium up to 6.2. He was given Kayexalate earlier this morning. We will give him another dose at the moment. In addition he will have repeat testing testing later this afternoon.     LOS: 2 Collin [name] 12/13/201611:28 AM

## 2015-08-24 NOTE — Progress Notes (Signed)
[place] [facility] [place] NAME: Kanai Debolt    MR#:  [id number]  DATE OF BIRTH:  02-Jan-1941  SUBJECTIVE:  CHIEF COMPLAINT:   Chief Complaint  Patient presents with  . Shortness of Breath   Better cough and shortness of breath, on O2 [place] 2L REVIEW OF SYSTEMS:  CONSTITUTIONAL: No fever, has weakness.  EYES: No blurred or double vision.  EARS, NOSE, AND THROAT: No tinnitus or ear pain.  RESPIRATORY: has cough, shortness of breath, no wheezing or hemoptysis.  CARDIOVASCULAR: No chest pain, orthopnea, edema.  GASTROINTESTINAL: No nausea, vomiting, diarrhea or abdominal pain.  GENITOURINARY: No dysuria, hematuria.  ENDOCRINE: No polyuria, nocturia,  HEMATOLOGY: No anemia, easy bruising or bleeding SKIN: No rash or lesion. MUSCULOSKELETAL: No joint pain or arthritis.   NEUROLOGIC: No tingling, numbness, weakness.  PSYCHIATRY: No anxiety or depression.   DRUG ALLERGIES:   Allergies  Allergen Reactions  . Lisinopril Swelling    Pt reports throat swelling.  . Metoprolol     Other reaction(s): Other (See Comments) Caused ringing in ears    VITALS:  Blood pressure 120/74, pulse 98, temperature 97.8 F (36.6 C), temperature source Oral, resp. rate 18, height 6\' 2"  (1.88 m), weight 88.043 kg (194 lb 1.6 oz), SpO2 91 %.  PHYSICAL EXAMINATION:  GENERAL:  74 y.o.-year-old patient lying in the bed with no acute distress.  EYES: Pupils equal, round, reactive to light and accommodation. No scleral icterus. Extraocular muscles intact.  HEENT: Head atraumatic, normocephalic. Oropharynx and nasopharynx clear.  NECK:  Supple, no jugular venous distention. No thyroid enlargement, no tenderness.  LUNGS: Normal breath sounds bilaterally, no wheezing or rales. No use of accessory muscles of respiration.  CARDIOVASCULAR: S1, S2 normal. No murmurs, rubs, or gallops.  ABDOMEN: Soft, nontender, nondistended. Bowel sounds present. No organomegaly or mass.   On Foley.  EXTREMITIES: No pedal edema, cyanosis, or clubbing.  NEUROLOGIC: Cranial nerves II through XII are intact. Muscle strength 4/5 in all extremities. Sensation intact. Gait not checked.  PSYCHIATRIC: The patient is alert and oriented x 3.  SKIN: No obvious rash, lesion, or ulcer.    LABORATORY PANEL:   CBC  Recent Labs Lab 08/24/15 0520  WBC 20.6*  HGB 12.4*  HCT 40.1  PLT 196   ------------------------------------------------------------------------------------------------------------------  Chemistries   Recent Labs Lab 08/24/15 0520  NA 131*  K 6.2*  CL 100*  CO2 20*  GLUCOSE 85  BUN 66*  CREATININE 4.28*  CALCIUM 8.6*   ------------------------------------------------------------------------------------------------------------------  Cardiac Enzymes  Recent Labs Lab 08/23/15 0602  TROPONINI 0.04*   ------------------------------------------------------------------------------------------------------------------  RADIOLOGY:  Dg Chest 2 View  08/22/2015  CLINICAL DATA:  Shortness of Breath EXAM: CHEST  2 VIEW COMPARISON:  July 07, 2015 FINDINGS: There is a pleuravac catheter at the right base. There is a small pleural effusion on the right. There is generalized interstitial edema. There is no alveolar/airspace consolidation. There is cardiomegaly with pulmonary venous hypertension. No adenopathy. There is atherosclerotic calcification in the aorta. IMPRESSION: Evidence of a degree of congestive heart failure. Pleuravac type catheter right base. No pneumothorax. Electronically Signed   By: Lowella Grip III M.D.   On: 08/22/2015 14:50   US Renal  08/23/2015  CLINICAL DATA:  Acute renal failure. Urolithiasis and hydronephrosis. Chronic systolic congestive heart failure. EXAM: RENAL / URINARY TRACT ULTRASOUND COMPLETE COMPARISON:  Noncontrast CT on 06/06/2015 FINDINGS: Right Kidney: Length: 13.9 cm. A 2 cm shadowing calculus is seen in the  upper  pole of the right kidney. Moderate right hydronephrosis is seen with the right ureteral stent in place. This appears similar to previous CT. Left Kidney: Length: 10.1 cm. Several small less than 1 cm shadowing calculi are seen. A 1.8 cm cyst is also seen in the midpole. No definite renal mass identified. No evidence of hydronephrosis. Bladder: Empty with Foley catheter in place.  Small amount of ascites noted. IMPRESSION: Moderate right hydronephrosis with right ureteral stent in place. This is similar in appearance to recent CT. No left hydronephrosis. Bilateral nephrolithiasis. Mild ascites. Electronically Signed   By: Earle Gell M.D.   On: 08/23/2015 15:43    EKG:   Orders placed or performed during the hospital encounter of 08/22/15  . ED EKG within 10 minutes  . ED EKG within 10 minutes    ASSESSMENT AND PLAN:   1. Acute on chronic systolic CHF with ejection fraction 25%.  Hold Lasix due to renal failure, continue oxygen therapy and NEB. Per Dr. Ubaldo Glassing, still significant drainage from his right PleurX chest tube of approximately [phone] mL a day. Will need to discuss consideration for pleurodesis of some tight to assist with his persistent effusion. Will discuss with Dr. Faith Rogue to consider.  2. Elevated troponin suspected demand ischemia, , Continue Coreg. Hold aspirin for IR right percutaneous nephrostomy tube placement tomorrow.  3. Hyperkalemia. Potassium is higher at 6.2. Given Kayexalate, calcium gluconate, NovoLog and D50 this morning. Follow-up BMP this afternoon and tomorrow morning.  4. Acute on chronic renal failure,  suspected acute urinary retention, placed Foley catheter. Renal function is worsening. Hold Lasix. Renal ultrasound showed Moderate right hydronephrosis with right ureteral stent in place. may need temporary hemodialysis per Dr. Holley Raring. Follow-up  IR right percutaneous nephrostomy tube placement tomorrow. follow-up BMP.  5. Acute pyelonephritis with  leukocytosis, leukocytosis is improving. continue Rocephin,  follow-up CBC, blood culture and urinary cultures. So far blood culture and urine culture negative.   6. COPD exacerbation due to acute bronchitis versus pneumonia,  taper prednisone, inhalation therapy with duleara, albuterol , tiotropium, Rocephin and Zithromax.  7. Tobacco abuse. Initiate patient on nicotine replacement therapy. Discussed this patient for 3 minutes   Hyponatremia. Follow up BMP.     All the records are reviewed and case discussed with Care Management/Social Workerr. Management plans discussed with the patient, his wife and they are in agreement.  CODE STATUS: Full code  TOTAL TIME TAKING CARE OF THIS PATIENT: 46 minutes.  Greater than 50% time was spent on coordination of care and face-to-face counseling.  POSSIBLE D/C IN 5 DAYS, DEPENDING ON CLINICAL CONDITION.   Demetrios Loll M.D on 08/24/2015 at 2:05 PM  Between 7am to 6pm - Pager - [phone]  After 6pm go to [email or web address] - password [email or web address] [facility]  Office  [phone]  CC: Primary care physician; [name], MD

## 2015-08-24 NOTE — Progress Notes (Signed)
Dr. Bridgett Larsson notified of K+ of 6.2 and worsening renal function.

## 2015-08-25 ENCOUNTER — Inpatient Hospital Stay: Payer: Medicare Other

## 2015-08-25 ENCOUNTER — Ambulatory Visit: Payer: Medicare Other

## 2015-08-25 ENCOUNTER — Other Ambulatory Visit: Payer: Self-pay

## 2015-08-25 DIAGNOSIS — N390 Urinary tract infection, site not specified: Secondary | ICD-10-CM

## 2015-08-25 DIAGNOSIS — N179 Acute kidney failure, unspecified: Secondary | ICD-10-CM

## 2015-08-25 DIAGNOSIS — J90 Pleural effusion, not elsewhere classified: Secondary | ICD-10-CM

## 2015-08-25 LAB — PROTEIN ELECTRO, RANDOM URINE
ALPHA-2-GLOBULIN, U: 15.4 %
Albumin ELP, Urine: 50.7 %
Alpha-1-Globulin, U: 5.3 %
BETA GLOBULIN, U: 17 %
Gamma Globulin, U: 11.7 %
M SPIKE UR: 3.7 % — AB
Total Protein, Urine: 355.4 mg/dL

## 2015-08-25 LAB — BASIC METABOLIC PANEL
Anion gap: 11 (ref 5–15)
BUN: 76 mg/dL — AB (ref 6–20)
CHLORIDE: 96 mmol/L — AB (ref 101–111)
CO2: 20 mmol/L — ABNORMAL LOW (ref 22–32)
CREATININE: 4.99 mg/dL — AB (ref 0.61–1.24)
Calcium: 7.9 mg/dL — ABNORMAL LOW (ref 8.9–10.3)
GFR calc Af Amer: 12 mL/min — ABNORMAL LOW (ref >60)
GFR, EST NON AFRICAN AMERICAN: 10 mL/min — AB (ref >60)
GLUCOSE: 117 mg/dL — AB (ref 65–99)
POTASSIUM: 4 mmol/L (ref 3.5–5.1)
SODIUM: 127 mmol/L — AB (ref 135–145)

## 2015-08-25 LAB — CBC
HEMATOCRIT: 36.9 % — AB (ref 40.0–52.0)
Hemoglobin: 11.4 g/dL — ABNORMAL LOW (ref 13.0–18.0)
MCH: 22.8 pg — ABNORMAL LOW (ref 26.0–34.0)
MCHC: 30.8 g/dL — AB (ref 32.0–36.0)
MCV: 74 fL — AB (ref 80.0–100.0)
PLATELETS: 184 10*3/uL (ref 150–440)
RBC: 4.98 MIL/uL (ref 4.40–5.90)
RDW: 19.8 % — AB (ref 11.5–14.5)
WBC: 15.3 10*3/uL — ABNORMAL HIGH (ref 3.8–10.6)

## 2015-08-25 MED ORDER — PREDNISONE 20 MG PO TABS
20.0000 mg | ORAL_TABLET | Freq: Every day | ORAL | Status: DC
  Administered 2015-08-26 – 2015-08-31 (×5): 20 mg via ORAL
  Filled 2015-08-25 (×6): qty 1

## 2015-08-25 MED ORDER — MIDAZOLAM HCL 5 MG/5ML IJ SOLN
INTRAMUSCULAR | Status: AC | PRN
  Administered 2015-08-25: 1 mg via INTRAVENOUS

## 2015-08-25 MED ORDER — MIDAZOLAM HCL 5 MG/5ML IJ SOLN
INTRAMUSCULAR | Status: AC
  Administered 2015-08-25: 11:00:00
  Filled 2015-08-25: qty 10

## 2015-08-25 MED ORDER — FENTANYL CITRATE (PF) 100 MCG/2ML IJ SOLN
INTRAMUSCULAR | Status: AC
  Administered 2015-08-25: 11:00:00
  Filled 2015-08-25: qty 4

## 2015-08-25 MED ORDER — FENTANYL CITRATE (PF) 100 MCG/2ML IJ SOLN
INTRAMUSCULAR | Status: AC | PRN
  Administered 2015-08-25: 50 ug via INTRAVENOUS

## 2015-08-25 MED ORDER — FLUCONAZOLE IN SODIUM CHLORIDE 200-0.9 MG/100ML-% IV SOLN
200.0000 mg | INTRAVENOUS | Status: DC
  Administered 2015-08-25 – 2015-08-30 (×6): 200 mg via INTRAVENOUS
  Filled 2015-08-25 (×7): qty 100

## 2015-08-25 MED ORDER — LIDOCAINE HCL (PF) 1 % IJ SOLN
INTRAMUSCULAR | Status: DC | PRN
  Administered 2015-08-25: 10 mL

## 2015-08-25 MED ORDER — CIPROFLOXACIN IN D5W 400 MG/200ML IV SOLN
400.0000 mg | Freq: Once | INTRAVENOUS | Status: AC
  Administered 2015-08-25: 400 mg via INTRAVENOUS

## 2015-08-25 MED ORDER — LIDOCAINE HCL (PF) 1 % IJ SOLN
INTRAMUSCULAR | Status: AC
  Filled 2015-08-25: qty 30

## 2015-08-25 MED ORDER — CIPROFLOXACIN IN D5W 400 MG/200ML IV SOLN
400.0000 mg | Freq: Two times a day (BID) | INTRAVENOUS | Status: DC
  Administered 2015-08-25: 400 mg via INTRAVENOUS

## 2015-08-25 MED ORDER — CIPROFLOXACIN IN D5W 400 MG/200ML IV SOLN
INTRAVENOUS | Status: AC
  Administered 2015-08-25: 11:00:00
  Filled 2015-08-25: qty 200

## 2015-08-25 MED ORDER — SODIUM CHLORIDE 0.9 % IV SOLN
INTRAVENOUS | Status: DC
  Administered 2015-08-25: 11:00:00 via INTRAVENOUS

## 2015-08-25 MED ORDER — IOHEXOL 300 MG/ML  SOLN: 50.0000 mL | Freq: Once | INTRAMUSCULAR | Status: DC | PRN

## 2015-08-25 NOTE — Progress Notes (Signed)
Initial Nutrition Assessment    INTERVENTION:   Meals and Snacks: Cater to patient preferences  NUTRITION DIAGNOSIS:   No nutrition diagnosis at this time  GOAL:   Patient will meet greater than or equal to 90% of their needs  MONITOR:    (Energy Intake, Anthropometrics, Electrolyte/Renal Profile, Digestive System)  REASON FOR ASSESSMENT:   Diagnosis    ASSESSMENT:    Pt admitted with acute on CHF, acute CHF, elevated troponin with demand ischemia; pt down for nephrostomy tube placement on visit today  Past Medical History  Diagnosis Date  . COPD (chronic obstructive pulmonary disease) ([place])   . Chronic systolic CHF (congestive heart failure) ([place])   . Kidney stone   . Macular degeneration   . HLD (hyperlipidemia)   . Hypertensive retinopathy   . HTN (hypertension)   . GERD (gastroesophageal reflux disease)     Diet Order:  Diet NPO time specified Except for: Sips with Meds   Energy Intake: po intake 75-100% of meals  Skin:  Reviewed, no issues  Last BM:  12/13  Electrolyte and Renal Profile:  Recent Labs Lab 08/24/15 0520 08/24/15 1332 08/25/15 0519  BUN 66* 71* 76*  CREATININE 4.28* 4.62* 4.99*  NA 131* 133* 127*  K 6.2* 5.1 4.0   Glucose Profile: No results for input(s): GLUCAP in the last 72 hours. Meds: reviewed  Height:   Ht Readings from Last 1 Encounters:  08/22/15 6\' 2"  (1.88 m)    Weight: weight trend as per weight encounters  Wt Readings from Last 1 Encounters:  08/25/15 197 lb 11.2 oz (89.676 kg)   Filed Weights   08/23/15 0500 08/24/15 0629 08/25/15 0433  Weight: 190 lb 3.2 oz (86.274 kg) 194 lb 1.6 oz (88.043 kg) 197 lb 11.2 oz (89.676 kg)    Wt Readings from Last 10 Encounters:  08/25/15 197 lb 11.2 oz (89.676 kg)  07/15/15 179 lb 7.3 oz (81.4 kg)  07/07/15 183 lb (83.008 kg)  07/05/15 183 lb 5 oz (83.15 kg)  06/13/15 171 lb 12.8 oz (77.928 kg)  06/06/15 179 lb (81.194 kg)  03/21/15 169 lb 6.4 oz (76.839 kg)   03/06/15 182 lb 6.4 oz (82.736 kg)    BMI:  Body mass index is 25.37 kg/(m^2).  LOW Care Level  Kerman Passey MS, [place], LDN [phone] Pager

## 2015-08-25 NOTE — Progress Notes (Signed)
ANTIBIOTIC CONSULT NOTE - INITIAL  Pharmacy Consult for fluconazole Indication: Candiduria  Allergies  Allergen Reactions  . Lisinopril Swelling    Pt reports throat swelling.  . Metoprolol     Other reaction(s): Other (See Comments) Caused ringing in ears    Patient Measurements: Height: 6\' 2"  (188 cm) Weight: 197 lb 11.2 oz (89.676 kg) IBW/kg (Calculated) : 82.2  Vital Signs: Temp: 97.4 F (36.3 C) (12/14 1258) Temp Source: Oral (12/14 1258) BP: 117/69 mmHg (12/14 1258) Pulse Rate: 79 (12/14 1258) Intake/Output from previous day: 12/13 0701 - 12/14 0700 In: 903 [P.O.:600; I.V.:3; IV Piggyback:300] Out: 700 [Urine:700] Intake/Output from this shift: Total I/O In: -  Out: 100 [Urine:100]  Labs:  Recent Labs  08/23/15 0602  08/24/15 0520 08/24/15 1332 08/25/15 0519  WBC 24.4*  --  20.6*  --  15.3*  HGB 13.0  --  12.4*  --  11.4*  PLT 215  --  196  --  184  CREATININE 3.30*  < > 4.28* 4.62* 4.99*  < > = values in this interval not displayed. Estimated Creatinine Clearance: 15.6 mL/min (by C-G formula based on Cr of 4.99).  Microbiology: Recent Results (from the past 720 hour(s))  Blood culture (routine x 2)     Status: None (Preliminary result)   Collection Time: 08/22/15  4:39 PM  Result Value Ref Range Status   Specimen Description BLOOD RIGHT FOREARM  Final   Special Requests   Final    BOTTLES DRAWN AEROBIC AND ANAEROBIC  AER 6CC ANA 2CC   Culture NO GROWTH 2 DAYS  Final   Report Status PENDING  Incomplete  Blood culture (routine x 2)     Status: None (Preliminary result)   Collection Time: 08/22/15  4:40 PM  Result Value Ref Range Status   Specimen Description BLOOD LEFT ARM  Final   Special Requests BOTTLES DRAWN AEROBIC AND ANAEROBIC  1CC  Final   Culture NO GROWTH 2 DAYS  Final   Report Status PENDING  Incomplete  Urine culture     Status: None (Preliminary result)   Collection Time: 08/23/15  3:55 PM  Result Value Ref Range Status   Specimen  Description URINE, RANDOM  Final   Special Requests NONE  Final   Culture   Final    80,000 COLONIES/ml YEAST IDENTIFICATION TO FOLLOW ONCE ISOLATED    Report Status PENDING  Incomplete   Medical History: Past Medical History  Diagnosis Date  . COPD (chronic obstructive pulmonary disease) ([place])   . Chronic systolic CHF (congestive heart failure) ([place])   . Kidney stone   . Macular degeneration   . HLD (hyperlipidemia)   . Hypertensive retinopathy   . HTN (hypertension)   . GERD (gastroesophageal reflux disease)    Medications:  Anti-infectives    Start     Dose/Rate Route Frequency Ordered Stop   08/25/15 1600  fluconazole (DIFLUCAN) IVPB 200 mg     200 mg 100 mL/hr over 60 Minutes Intravenous Every 24 hours 08/25/15 1519     08/25/15 1045  ciprofloxacin (CIPRO) IVPB 400 mg  Status:  Discontinued     400 mg 200 mL/hr over 60 Minutes Intravenous Every 12 hours 08/25/15 1034 08/25/15 1043   08/25/15 1045  ciprofloxacin (CIPRO) IVPB 400 mg     400 mg 200 mL/hr over 60 Minutes Intravenous Once 08/25/15 1043 08/25/15 1145   08/25/15 1040  ciprofloxacin (CIPRO) 400 MG/200ML IVPB    Comments:  Darlin Drop: cabinet  override      08/25/15 1040 08/25/15 1045   08/23/15 1800  cefTRIAXone (ROCEPHIN) 1 g in dextrose 5 % 50 mL IVPB     1 g 100 mL/hr over 30 Minutes Intravenous Every 24 hours 08/22/15 1752     08/22/15 1630  cefTRIAXone (ROCEPHIN) 1 g in dextrose 5 % 50 mL IVPB  Status:  Discontinued     1 g 100 mL/hr over 30 Minutes Intravenous Every 24 hours 08/22/15 1624 08/22/15 1752   08/22/15 1630  azithromycin (ZITHROMAX) 500 mg in dextrose 5 % 250 mL IVPB     500 mg 250 mL/hr over 60 Minutes Intravenous Every 24 hours 08/22/15 1624     08/22/15 1600  cefTRIAXone (ROCEPHIN) 1 g in dextrose 5 % 50 mL IVPB     1 g 100 mL/hr over 30 Minutes Intravenous  Once 08/22/15 1553 08/22/15 1802     Assessment: Pharmacy consulted to dose fluconazole on this 74 year old male who  underwent placement of a nephrostomy tube today. Patient is currently on ceftriaxone for a UTI. Urine culture grew 80K colonies of yeast (identification pending). Patient meets criteria for treatment due to invasive procedure.  CrCl ~ 16 mL/min  Plan:  Follow up culture results  For patients undergoing urologic procedures with candiduria, the recommended dose is fluconazole 400 mg daily. For patients with a CrCl < 50 mL/min, the recommendation is to give 50% of the recommended dose.  Will start fluconazole 200 mg IV daily.   Pharmacy will continue to monitor, thank you for the consult.  [name] Jisele Price 08/25/2015,3:19 PM

## 2015-08-25 NOTE — Plan of Care (Signed)
Problem: Education: Goal: Knowledge of treatment and prevention of UTI/Pyleonephritis will improve Outcome: Progressing Provided with education on nephrostomy procedure and tube.

## 2015-08-25 NOTE — Sedation Documentation (Signed)
80 ml of "muddy" colopred fluid aspirated prior to drainage bag.  10.2 Fr. Multipurpose drain insertred with US/Flouro assistance.

## 2015-08-25 NOTE — Progress Notes (Addendum)
Patient returned from specials, VSS. C/o chronic pain 8/10, percocet given per pt request. Nephrostomy tube to R flank in place and draining milky, reddish fluid: 100cc in bad already, marked and dated. Will continue to monitor. Hospitalist deferred to urology with respect to restarting diet order. D/t procedure, pt now high falls: pt and spouse educated on safety and bed alarm turned on.

## 2015-08-25 NOTE — Progress Notes (Signed)
Urology Consult Follow Up  Subjective: Right nephrostomy tube placed today in the setting of rising Cr.  Tolerated procedure well.  Complains of increased leg swelling today.  Good urine output from nephrostomy tube.  Anti-infectives: Anti-infectives    Start     Dose/Rate Route Frequency Ordered Stop   08/25/15 1600  fluconazole (DIFLUCAN) IVPB 200 mg     200 mg 100 mL/hr over 60 Minutes Intravenous Every 24 hours 08/25/15 1519     08/25/15 1045  ciprofloxacin (CIPRO) IVPB 400 mg  Status:  Discontinued     400 mg 200 mL/hr over 60 Minutes Intravenous Every 12 hours 08/25/15 1034 08/25/15 1043   08/25/15 1045  ciprofloxacin (CIPRO) IVPB 400 mg     400 mg 200 mL/hr over 60 Minutes Intravenous Once 08/25/15 1043 08/25/15 1145   08/25/15 1040  ciprofloxacin (CIPRO) 400 MG/200ML IVPB    Comments:  Darlin Drop: cabinet override      08/25/15 1040 08/25/15 1045   08/23/15 1800  cefTRIAXone (ROCEPHIN) 1 g in dextrose 5 % 50 mL IVPB     1 g 100 mL/hr over 30 Minutes Intravenous Every 24 hours 08/22/15 1752     08/22/15 1630  cefTRIAXone (ROCEPHIN) 1 g in dextrose 5 % 50 mL IVPB  Status:  Discontinued     1 g 100 mL/hr over 30 Minutes Intravenous Every 24 hours 08/22/15 1624 08/22/15 1752   08/22/15 1630  azithromycin (ZITHROMAX) 500 mg in dextrose 5 % 250 mL IVPB     500 mg 250 mL/hr over 60 Minutes Intravenous Every 24 hours 08/22/15 1624     08/22/15 1600  cefTRIAXone (ROCEPHIN) 1 g in dextrose 5 % 50 mL IVPB     1 g 100 mL/hr over 30 Minutes Intravenous  Once 08/22/15 1553 08/22/15 1802      Current Facility-Administered Medications  Medication Dose Route Frequency Provider Last Rate Last Dose  . 0.9 %  sodium chloride infusion   Intravenous Continuous Markus Daft, MD 10 mL/hr at 08/25/15 1033    . acetaminophen (TYLENOL) tablet 650 mg  650 mg Oral Q6H PRN Theodoro Grist, MD       Or  . acetaminophen (TYLENOL) suppository 650 mg  650 mg Rectal Q6H PRN Theodoro Grist, MD      .  albuterol (PROVENTIL) (2.5 MG/3ML) 0.083% nebulizer solution 2.5 mg  2.5 mg Nebulization Q6H PRN Theodoro Grist, MD      . ALPRAZolam Duanne Moron) tablet 0.25 mg  0.25 mg Oral QHS PRN Theodoro Grist, MD   0.25 mg at 08/23/15 2256  . azithromycin (ZITHROMAX) 500 mg in dextrose 5 % 250 mL IVPB  500 mg Intravenous Q24H Theodoro Grist, MD   500 mg at 08/25/15 1650  . carvedilol (COREG) tablet 12.5 mg  12.5 mg Oral BID WC Demetrios Loll, MD   12.5 mg at 08/25/15 1650  . cefTRIAXone (ROCEPHIN) 1 g in dextrose 5 % 50 mL IVPB  1 g Intravenous Q24H Theodoro Grist, MD   1 g at 08/25/15 1650  . docusate sodium (COLACE) capsule 100 mg  100 mg Oral BID Theodoro Grist, MD   100 mg at 08/25/15 0841  . fluconazole (DIFLUCAN) IVPB 200 mg  200 mg Intravenous Q24H Lenis Noon, RPH   200 mg at 08/25/15 1539  . HYDROcodone-acetaminophen (NORCO/VICODIN) 5-325 MG per tablet 1-2 tablet  1-2 tablet Oral Q4H PRN Theodoro Grist, MD   1 tablet at 08/23/15 0514  . iohexol (OMNIPAQUE) 300 MG/ML solution  50 mL  50 mL Other Once PRN Markus Daft, MD      . lactulose (CHRONULAC) 10 GM/15ML solution 20 g  20 g Oral BID PRN Demetrios Loll, MD      . lidocaine (PF) (XYLOCAINE) 1 % injection    PRN Markus Daft, MD   10 mL at 08/25/15 1127  . mometasone-formoterol (DULERA) 100-5 MCG/ACT inhaler 2 puff  2 puff Inhalation BID Theodoro Grist, MD   2 puff at 08/25/15 0842  . nicotine polacrilex (NICORETTE) gum 2 mg  2 mg Oral PRN Theodoro Grist, MD      . nitroGLYCERIN (NITROSTAT) SL tablet 0.4 mg  0.4 mg Sublingual Q5 min PRN Theodoro Grist, MD      . ondansetron (ZOFRAN) tablet 4 mg  4 mg Oral Q6H PRN Theodoro Grist, MD       Or  . ondansetron (ZOFRAN) injection 4 mg  4 mg Intravenous Q6H PRN Theodoro Grist, MD   4 mg at 08/23/15 1422  . oxyCODONE-acetaminophen (PERCOCET/ROXICET) 5-325 MG per tablet 1-2 tablet  1-2 tablet Oral Q4H PRN Theodoro Grist, MD   2 tablet at 08/25/15 1650  . pantoprazole (PROTONIX) EC tablet 40 mg  40 mg Oral Daily Theodoro Grist, MD   40 mg  at 08/25/15 0841  . [START ON 08/26/2015] predniSONE (DELTASONE) tablet 20 mg  20 mg Oral Q breakfast Demetrios Loll, MD      . sodium chloride 0.9 % injection 3 mL  3 mL Intravenous Q12H Theodoro Grist, MD   3 mL at 08/25/15 0843  . tiotropium (SPIRIVA) inhalation capsule 18 mcg  18 mcg Inhalation Daily Theodoro Grist, MD   18 mcg at 08/25/15 0842     Objective: Vital signs in last 24 hours: Temp:  [97.4 F (36.3 C)-97.9 F (36.6 C)] 97.4 F (36.3 C) (12/14 1258) Pulse Rate:  [78-95] 79 (12/14 1258) Resp:  [10-27] 18 (12/14 1258) BP: (107-133)/(65-80) 117/69 mmHg (12/14 1258) SpO2:  [90 %-98 %] 96 % (12/14 1258) FiO2 (%):  [92 %] 92 % (12/14 1015) Weight:  [197 lb 11.2 oz (89.676 kg)] 197 lb 11.2 oz (89.676 kg) (12/14 0433)  Intake/Output from previous day: 12/13 0701 - 12/14 0700 In: 903 [P.O.:600; I.V.:3; IV Piggyback:300] Out: 1300 [Urine:700; Drains:600] Intake/Output this shift:     Physical Exam Constitutional: Well nourished. Alert and oriented, No acute distress. HEENT: [place] AT, moist mucus membranes. Trachea midline, no masses. Cardiovascular: No clubbing, cyanosis, or edema. Respiratory: + Increased WOB, wearing 02.   GI: Abdomen is soft, non tender, non distended, no abdominal masses, acities appreciated.   GU: Right CVA tenderness with nephrostomy tube in place draining cloudy yellow urine.  Site is clean and dry and intact. Foley catheter draining less cloudy yellow urine.  Skin: + arm bruising Neurologic: Grossly intact, no focal deficits, moving all 4 extremities. Psychiatric: Normal mood and affect.  Lab Results:   Recent Labs  08/24/15 0520 08/25/15 0519  WBC 20.6* 15.3*  HGB 12.4* 11.4*  HCT 40.1 36.9*  PLT 196 184   BMET  Recent Labs  08/24/15 1332 08/25/15 0519  NA 133* 127*  K 5.1 4.0  CL 96* 96*  CO2 22 20*  GLUCOSE 105* 117*  BUN 71* 76*  CREATININE 4.62* 4.99*  CALCIUM 8.6* 7.9*   PT/INR  Recent Labs  08/24/15 0520  LABPROT 16.0*   INR 1.27   ABG No results for input(s): PHART, HCO3 in the last 72 hours.  Invalid input(s): PCO2,  PO2  Studies/Results: [place] Intraoperative  08/25/2015  CLINICAL DATA:  Ultrasound was provided for use by the ordering physician, and a technical charge was applied by the performing facility.  No radiologist interpretation/professional services rendered.   Ir Nephrostomy Placement Right  08/25/2015  CLINICAL DATA:  73 year old with right hydronephrosis and worsening renal function. Known right kidney stones. EXAM: PLACEMENT OF RIGHT PERCUTANEOUS NEPHROSTOMY TUBE WITH ULTRASOUND AND FLUOROSCOPIC GUIDANCE Physician: Stephan Minister. Anselm Pancoast, MD FLUOROSCOPY TIME:  48 seconds MEDICATIONS: Ciprofloxacin 400 mg. Ciprofloxacin was given within two hours of incision. 1 mg Versed, 50 mcg fentanyl. A radiology nurse monitored the patient for moderate sedation. ANESTHESIA/SEDATION: Moderate sedation time: 18 minutes PROCEDURE: The procedure was explained to the patient. The risks and benefits of the procedure were discussed and the patient's questions were addressed. Informed consent was obtained from the patient. Patient was placed prone on the interventional table. The right kidney with identified with ultrasound and the skin was marked. The right flank was prepped and draped in sterile fashion. Maximal barrier sterile technique was utilized including caps, mask, sterile gowns, sterile gloves, sterile drape, hand hygiene and skin antiseptic. Skin was anesthetized with 1% lidocaine. A 21 gauge needle was directed into a dilated mid/upper pole calyx with ultrasound guidance. Needle placement confirmed within the calyx. Purulent or "milky" urine was coming out of the needle. A 0.018 wire was advanced into the renal collecting system. Needle was exchanged for an Accustick dilator set. The tract was dilated to accommodate a 10 [place] drain. A 10.2 Leonides Schanz catheter was advanced over a J-wire into the region of the  renal pelvis. Greater than 80 mL of the "milky" purulent fluid was removed from the kidney. Fluid was sent for culture. Catheter was sutured to the skin and a sterile dressing was placed. Fluoroscopic and ultrasound images were taken and saved for documentation. FINDINGS: Moderate right hydronephrosis by ultrasound. Needle directed into the mid/upper pole calyx. Greater than 80 mL of purulent fluid was removed from the right kidney. Right kidney was at least partially decompressed following placement of the nephrostomy tube. There is a right ureter stent near the renal pelvis. There is a staghorn-type calculus or calculi in the right kidney. COMPLICATIONS: None IMPRESSION: Successful percutaneous right nephrostomy tube placement with ultrasound and fluoroscopic guidance. Purulent fluid was removed from the right kidney. Findings are suggestive for pyonephrosis. Electronically Signed   By: Markus Daft M.D.   On: 08/25/2015 12:14     Assessment: 1.  Acute on chronic renal failure status post right nephrostomy tube placement 2. Leukocytosis, improving, likely secondary to infection versus steroids 3. Possible right pyelonephritis, on antibiotics, cultures pending, Afebrile 4. Bilateral nephrolithiasis with right encrusted, retained ureteral stent-will ultimately need right ureteroscopy to remove right-sided stent and treat right-sided obstructing stones  Plan: - follow-up labs in a.m. - Monitor urine output, at risk for postobstructive diuresis - No additional surgical intervention for obstructing stones, retained stent at this time given pyelonephrosis - Continue antibiotics - Consider addition of antifungal should patient decompensate - Urology to follow this patient    LOS: 3 days    Hollice Espy 08/25/2015

## 2015-08-25 NOTE — Progress Notes (Signed)
Per Dr. Erlene Quan, maintain foley catheter as renal function is still poor.

## 2015-08-25 NOTE — Procedures (Signed)
Successful right percutaneous nephrostomy tube placement.  Greater than 80 ml of yellow purulent fluid removed from kidney.  Right kidney appeared decompressed by [place] after nephrostomy tube placement.  Will monitor patient in recovery for one hour prior to returning to floor.  Fluid sent for culture.

## 2015-08-25 NOTE — Plan of Care (Signed)
Problem: Urinary Elimination: Goal: Signs and symptoms of infection will decrease Outcome: Progressing Foley in place; nephrostomy tube in place. Both draining well.

## 2015-08-25 NOTE — Progress Notes (Signed)
[name] Post[facility]  Patient ID: Collin [name], male   DOB: 09/04/1941, 74 y.o.   MRN: [id number]  HISTORY: Asked to see patient again by Dr. Jordan Hawks regarding [name].  His wife has been draining anywhere between 1 and 1.5 liters every day from his PleurX catheter based upon his dyspnea.  They would like to talk about other options as they are finding the level of care concerning and wondered if a more viable permanent solution is available.   Filed Vitals:   08/25/15 0433 08/25/15 1015  BP: 125/76 107/65  Pulse: 95 81  Temp: 97.9 F (36.6 C) 97.7 F (36.5 C)  Resp: 18 16     EXAM: Resp: Lungs are clear bilaterally.  No respiratory distress, normal effort. Heart:  Regular Abd:  Abdomen is distended but soft and non tender. No masses are palpable.  There is no rebound and no guarding.  Neurological: Alert and oriented to person, place, and time. Coordination normal.  Skin: Skin is warm and dry. No rash noted. No diaphoretic. No erythema. No pallor.  Psychiatric: Normal mood and affect. Normal behavior. Judgment and thought content normal.    ASSESSMENT: Recurrent right pleural effusion.  Status post PleurX catheter insertion.  I spent at least 20 minutes reviewing the options with the patient and his wife.  We again discussed talc slurry via existing PleurX catheter as well as talc insufflation via thoracoscopy.  Risks and benefits of these procedures reviewed.  Also discussed just leaving PleurX in place on a "permanent" basis.     PLAN:   They will think about their options and I gave them my business card.  They will call and make appointment when they are ready.    Nestor Lewandowsky, MD

## 2015-08-25 NOTE — Progress Notes (Signed)
[facility] [place] NAME: Collin [name]    MR#:  [id number]  DATE OF BIRTH:  1941/04/16  SUBJECTIVE:  CHIEF COMPLAINT:   Chief Complaint  Patient presents with  . Shortness of Breath   Mild cough and shortness of breath, on O2 [place] 2L, status post nephrostomy tube placement this morning. REVIEW OF SYSTEMS:  CONSTITUTIONAL: No fever, has weakness.  EYES: No blurred or double vision.  EARS, NOSE, AND THROAT: No tinnitus or ear pain.  RESPIRATORY: has cough, shortness of breath, no wheezing or hemoptysis.  CARDIOVASCULAR: No chest pain, orthopnea, edema.  GASTROINTESTINAL: No nausea, vomiting, diarrhea or abdominal pain.  GENITOURINARY: No dysuria, bloody urine from nephrostomy tube. ENDOCRINE: No polyuria, nocturia,  HEMATOLOGY: No anemia, easy bruising or bleeding SKIN: No rash or lesion. MUSCULOSKELETAL: No joint pain or arthritis.   NEUROLOGIC: No tingling, numbness, weakness.  PSYCHIATRY: No anxiety or depression.   DRUG ALLERGIES:   Allergies  Allergen Reactions  . Lisinopril Swelling    Pt reports throat swelling.  . Metoprolol     Other reaction(s): Other (See Comments) Caused ringing in ears    VITALS:  Blood pressure 117/69, pulse 79, temperature 97.4 F (36.3 C), temperature source Oral, resp. rate 18, height 6\' 2"  (1.88 m), weight 89.676 kg (197 lb 11.2 oz), SpO2 96 %.  PHYSICAL EXAMINATION:  GENERAL:  74 y.o.-year-old patient lying in the bed with no acute distress.  EYES: Pupils equal, round, reactive to light and accommodation. No scleral icterus. Extraocular muscles intact.  HEENT: Head atraumatic, normocephalic. Oropharynx and nasopharynx clear.  NECK:  Supple, no jugular venous distention. No thyroid enlargement, no tenderness.  LUNGS: Normal breath sounds bilaterally, no wheezing or rales. No use of accessory muscles of respiration.  CARDIOVASCULAR: S1, S2 normal. No murmurs, rubs, or gallops.  ABDOMEN:  Soft, nontender, nondistended. Bowel sounds present. No organomegaly or mass.  On Foley. bloody urine from nephrostomy tube.  EXTREMITIES: No pedal edema, cyanosis, or clubbing.  NEUROLOGIC: Cranial nerves II through XII are intact. Muscle strength 4/5 in all extremities. Sensation intact. Gait not checked.  PSYCHIATRIC: The patient is alert and oriented x 3.  SKIN: No obvious rash, lesion, or ulcer.    LABORATORY PANEL:   CBC  Recent Labs Lab 08/25/15 0519  WBC 15.3*  HGB 11.4*  HCT 36.9*  PLT 184   ------------------------------------------------------------------------------------------------------------------  Chemistries   Recent Labs Lab 08/25/15 0519  NA 127*  K 4.0  CL 96*  CO2 20*  GLUCOSE 117*  BUN 76*  CREATININE 4.99*  CALCIUM 7.9*   ------------------------------------------------------------------------------------------------------------------  Cardiac Enzymes  Recent Labs Lab 08/23/15 0602  TROPONINI 0.04*   ------------------------------------------------------------------------------------------------------------------  RADIOLOGY:  [place] Intraoperative  08/25/2015  CLINICAL DATA:  Ultrasound was provided for use by the ordering physician, and a technical charge was applied by the performing facility.  No radiologist interpretation/professional services rendered.   US Renal  08/23/2015  CLINICAL DATA:  Acute renal failure. Urolithiasis and hydronephrosis. Chronic systolic congestive heart failure. EXAM: RENAL / URINARY TRACT ULTRASOUND COMPLETE COMPARISON:  Noncontrast CT on 06/06/2015 FINDINGS: Right Kidney: Length: 13.9 cm. A 2 cm shadowing calculus is seen in the upper pole of the right kidney. Moderate right hydronephrosis is seen with the right ureteral stent in place. This appears similar to previous CT. Left Kidney: Length: 10.1 cm. Several small less than 1 cm shadowing calculi are seen. A 1.8 cm cyst is also seen in the midpole. No definite  renal mass identified. No evidence of hydronephrosis. Bladder: Empty with Foley catheter in place.  Small amount of ascites noted. IMPRESSION: Moderate right hydronephrosis with right ureteral stent in place. This is similar in appearance to recent CT. No left hydronephrosis. Bilateral nephrolithiasis. Mild ascites. Electronically Signed   By: Earle Gell M.D.   On: 08/23/2015 15:43   Ir Nephrostomy Placement Right  08/25/2015  CLINICAL DATA:  74 year old with right hydronephrosis and worsening renal function. Known right kidney stones. EXAM: PLACEMENT OF RIGHT PERCUTANEOUS NEPHROSTOMY TUBE WITH ULTRASOUND AND FLUOROSCOPIC GUIDANCE Physician: Stephan Minister. Anselm Pancoast, MD FLUOROSCOPY TIME:  48 seconds MEDICATIONS: Ciprofloxacin 400 mg. Ciprofloxacin was given within two hours of incision. 1 mg Versed, 50 mcg fentanyl. A radiology nurse monitored the patient for moderate sedation. ANESTHESIA/SEDATION: Moderate sedation time: 18 minutes PROCEDURE: The procedure was explained to the patient. The risks and benefits of the procedure were discussed and the patient's questions were addressed. Informed consent was obtained from the patient. Patient was placed prone on the interventional table. The right kidney with identified with ultrasound and the skin was marked. The right flank was prepped and draped in sterile fashion. Maximal barrier sterile technique was utilized including caps, mask, sterile gowns, sterile gloves, sterile drape, hand hygiene and skin antiseptic. Skin was anesthetized with 1% lidocaine. A 21 gauge needle was directed into a dilated mid/upper pole calyx with ultrasound guidance. Needle placement confirmed within the calyx. Purulent or "milky" urine was coming out of the needle. A 0.018 wire was advanced into the renal collecting system. Needle was exchanged for an Accustick dilator set. The tract was dilated to accommodate a 10 [place] drain. A 10.2 Leonides Schanz catheter was advanced over a J-wire into  the region of the renal pelvis. Greater than 80 mL of the "milky" purulent fluid was removed from the kidney. Fluid was sent for culture. Catheter was sutured to the skin and a sterile dressing was placed. Fluoroscopic and ultrasound images were taken and saved for documentation. FINDINGS: Moderate right hydronephrosis by ultrasound. Needle directed into the mid/upper pole calyx. Greater than 80 mL of purulent fluid was removed from the right kidney. Right kidney was at least partially decompressed following placement of the nephrostomy tube. There is a right ureter stent near the renal pelvis. There is a staghorn-type calculus or calculi in the right kidney. COMPLICATIONS: None IMPRESSION: Successful percutaneous right nephrostomy tube placement with ultrasound and fluoroscopic guidance. Purulent fluid was removed from the right kidney. Findings are suggestive for pyonephrosis. Electronically Signed   By: Markus Daft M.D.   On: 08/25/2015 12:14    EKG:   Orders placed or performed during the hospital encounter of 08/22/15  . ED EKG within 10 minutes  . ED EKG within 10 minutes    ASSESSMENT AND PLAN:   1. Acute on chronic systolic CHF with ejection fraction 25%.  Hold Lasix due to renal failure, continue oxygen therapy and NEB. Per Dr. Ubaldo Glassing, still significant drainage from his right PleurX chest tube of approximately [phone] mL a day.Need to discuss consideration for pleurodesis of some tight to assist with his persistent effusion. Per Dr. Genevive Bi, discussed with the patient and the family member about talc slurry via existing PleurX catheter as well as talc insufflation via thoracoscopy.  2. Elevated troponin suspected demand ischemia, , Continue Coreg. Resume aspirin after IR right percutaneous nephrostomy tube placement.  3. Hyperkalemia. Improved after treatment of Kayexalate, calcium gluconate, NovoLog and D50. Follow-up BMP.  4. Acute on chronic  renal failure,  suspected acute urinary  retention, placed Foley catheter. Renal function is worsening. Hold Lasix. Renal ultrasound showed Moderate right hydronephrosis with right ureteral stent in place. may need temporary hemodialysis per Dr. Holley Raring. Status post  IR right percutaneous nephrostomy tube placement today. follow-up BMP.  5. Acute pyelonephritis with leukocytosis, leukocytosis is improving. continue Rocephin, add Diflucan due to urine culture showing yeast, follow-up CBC, blood culture and urinary cultures. So far blood culture is negative.   6. COPD exacerbation due to acute bronchitis versus pneumonia,  taper prednisone, inhalation therapy with duleara, albuterol , tiotropium, Rocephin and Zithromax.  7. Tobacco abuse. Initiate patient on nicotine replacement therapy. Discussed this patient for 3 minutes   Hyponatremia. Worsening, Follow up BMP.     All the records are reviewed and case discussed with Care Management/Social Workerr. Management plans discussed with the patient, his wife and they are in agreement.  CODE STATUS: Full code  TOTAL TIME TAKING CARE OF THIS PATIENT: 42 minutes.  Greater than 50% time was spent on coordination of care and face-to-face counseling.  POSSIBLE D/C IN 3 DAYS, DEPENDING ON CLINICAL CONDITION.   Demetrios Loll M.D on 08/25/2015 at 3:01 PM  Between 7am to 6pm - Pager - [phone]  After 6pm go to [email or web address] - password [email or web address]  Office  [phone]  CC: Primary care physician; [name], MD

## 2015-08-25 NOTE — Progress Notes (Signed)
[facility]  ROUNDING NOTE   Subjective:  Potassium normalized to 4.0. Creatinine down to 4.99. Right-sided nephrostomy placed today. Significant amount of pus coming out of nephrostomy.   Objective:  Vital signs in last 24 hours:  Temp:  [97.4 F (36.3 C)-97.9 F (36.6 C)] 97.4 F (36.3 C) (12/14 1258) Pulse Rate:  [78-95] 79 (12/14 1258) Resp:  [10-27] 18 (12/14 1258) BP: (107-133)/(65-80) 117/69 mmHg (12/14 1258) SpO2:  [90 %-98 %] 96 % (12/14 1258) FiO2 (%):  [92 %] 92 % (12/14 1015) Weight:  [89.676 kg (197 lb 11.2 oz)] 89.676 kg (197 lb 11.2 oz) (12/14 0433)  Weight change: 1.633 kg (3 lb 9.6 oz) Filed Weights   08/23/15 0500 08/24/15 0629 08/25/15 0433  Weight: 86.274 kg (190 lb 3.2 oz) 88.043 kg (194 lb 1.6 oz) 89.676 kg (197 lb 11.2 oz)    Intake/Output: I/O last 3 completed shifts: In: 903 [P.O.:600; I.V.:3; IV Piggyback:300] Out: 1875 [Urine:1275; Drains:600]   Intake/Output this shift:  Total I/O In: -  Out: 1300 [Urine:100; Drains:1200]  Physical Exam: General: NAD, resting in bed  Head: Normocephalic, atraumatic. Moist oral mucosal membranes  Eyes: Anicteric  Neck: Supple, trachea midline  Lungs:  Basilar rales, normal effort  Heart: S1S2 no rubs  Abdomen:  Soft, nontender, BS present   Extremities: trace peripheral edema.  Neurologic: Nonfocal, moving all four extremities  Skin: No lesions       Basic Metabolic Panel:  Recent Labs Lab 08/23/15 1235 08/23/15 1842 08/24/15 0520 08/24/15 1332 08/25/15 0519  NA 130* 128* 131* 133* 127*  K 5.8* 5.9* 6.2* 5.1 4.0  CL 103 101 100* 96* 96*  CO2 18* 18* 20* 22 20*  GLUCOSE 187* 145* 85 105* 117*  BUN 59* 56* 66* 71* 76*  CREATININE 3.64* 3.79* 4.28* 4.62* 4.99*  CALCIUM 8.7* 8.4* 8.6* 8.6* 7.9*    Liver Function Tests: No results for input(s): AST, ALT, ALKPHOS, BILITOT, PROT, ALBUMIN in the last 168 hours. No results for input(s): LIPASE, AMYLASE in the last 168 hours. No  results for input(s): AMMONIA in the last 168 hours.  CBC:  Recent Labs Lab 08/22/15 1322 08/23/15 0602 08/24/15 0520 08/25/15 0519  WBC 14.7* 24.4* 20.6* 15.3*  HGB 14.1 13.0 12.4* 11.4*  HCT 46.2 42.3 40.1 36.9*  MCV 75.3* 75.0* 75.2* 74.0*  PLT 253 215 196 184    Cardiac Enzymes:  Recent Labs Lab 08/22/15 1322 08/22/15 1950 08/23/15 0120 08/23/15 0602  TROPONINI 0.13* 0.04* 0.04* 0.04*    BNP: Invalid input(s): POCBNP  CBG: No results for input(s): GLUCAP in the last 168 hours.  Microbiology: Results for orders placed or performed during the hospital encounter of 08/22/15  Blood culture (routine x 2)     Status: None (Preliminary result)   Collection Time: 08/22/15  4:39 PM  Result Value Ref Range Status   Specimen Description BLOOD RIGHT FOREARM  Final   Special Requests   Final    BOTTLES DRAWN AEROBIC AND ANAEROBIC  AER 6CC ANA 2CC   Culture NO GROWTH 2 DAYS  Final   Report Status PENDING  Incomplete  Blood culture (routine x 2)     Status: None (Preliminary result)   Collection Time: 08/22/15  4:40 PM  Result Value Ref Range Status   Specimen Description BLOOD LEFT ARM  Final   Special Requests BOTTLES DRAWN AEROBIC AND ANAEROBIC  1CC  Final   Culture NO GROWTH 2 DAYS  Final   Report Status  PENDING  Incomplete  Urine culture     Status: None (Preliminary result)   Collection Time: 08/23/15  3:55 PM  Result Value Ref Range Status   Specimen Description URINE, RANDOM  Final   Special Requests NONE  Final   Culture   Final    80,000 COLONIES/ml YEAST IDENTIFICATION TO FOLLOW ONCE ISOLATED    Report Status PENDING  Incomplete    Coagulation Studies:  Recent Labs  08/24/15 0520  LABPROT 16.0*  INR 1.27    Urinalysis:  Recent Labs  08/23/15 1555  COLORURINE YELLOW*  LABSPEC 1.015  PHURINE 5.0  GLUCOSEU NEGATIVE  HGBUR 2+*  BILIRUBINUR NEGATIVE  KETONESUR NEGATIVE  PROTEINUR 100*  NITRITE NEGATIVE  LEUKOCYTESUR 2+*       Imaging: [place] Intraoperative  08/25/2015  CLINICAL DATA:  Ultrasound was provided for use by the ordering physician, and a technical charge was applied by the performing facility.  No radiologist interpretation/professional services rendered.   Ir Nephrostomy Placement Right  08/25/2015  CLINICAL DATA:  74 year old with right hydronephrosis and worsening renal function. Known right kidney stones. EXAM: PLACEMENT OF RIGHT PERCUTANEOUS NEPHROSTOMY TUBE WITH ULTRASOUND AND FLUOROSCOPIC GUIDANCE Physician: Stephan Minister. Anselm Pancoast, MD FLUOROSCOPY TIME:  48 seconds MEDICATIONS: Ciprofloxacin 400 mg. Ciprofloxacin was given within two hours of incision. 1 mg Versed, 50 mcg fentanyl. A radiology nurse monitored the patient for moderate sedation. ANESTHESIA/SEDATION: Moderate sedation time: 18 minutes PROCEDURE: The procedure was explained to the patient. The risks and benefits of the procedure were discussed and the patient's questions were addressed. Informed consent was obtained from the patient. Patient was placed prone on the interventional table. The right kidney with identified with ultrasound and the skin was marked. The right flank was prepped and draped in sterile fashion. Maximal barrier sterile technique was utilized including caps, mask, sterile gowns, sterile gloves, sterile drape, hand hygiene and skin antiseptic. Skin was anesthetized with 1% lidocaine. A 21 gauge needle was directed into a dilated mid/upper pole calyx with ultrasound guidance. Needle placement confirmed within the calyx. Purulent or "milky" urine was coming out of the needle. A 0.018 wire was advanced into the renal collecting system. Needle was exchanged for an Accustick dilator set. The tract was dilated to accommodate a 10 [place] drain. A 10.2 Leonides Schanz catheter was advanced over a J-wire into the region of the renal pelvis. Greater than 80 mL of the "milky" purulent fluid was removed from the kidney. Fluid was sent for  culture. Catheter was sutured to the skin and a sterile dressing was placed. Fluoroscopic and ultrasound images were taken and saved for documentation. FINDINGS: Moderate right hydronephrosis by ultrasound. Needle directed into the mid/upper pole calyx. Greater than 80 mL of purulent fluid was removed from the right kidney. Right kidney was at least partially decompressed following placement of the nephrostomy tube. There is a right ureter stent near the renal pelvis. There is a staghorn-type calculus or calculi in the right kidney. COMPLICATIONS: None IMPRESSION: Successful percutaneous right nephrostomy tube placement with ultrasound and fluoroscopic guidance. Purulent fluid was removed from the right kidney. Findings are suggestive for pyonephrosis. Electronically Signed   By: Markus Daft M.D.   On: 08/25/2015 12:14     Medications:   . sodium chloride 10 mL/hr at 08/25/15 1033   . azithromycin  500 mg Intravenous Q24H  . carvedilol  12.5 mg Oral BID WC  . cefTRIAXone (ROCEPHIN)  IV  1 g Intravenous Q24H  . docusate sodium  100  mg Oral BID  . fluconazole (DIFLUCAN) IV  200 mg Intravenous Q24H  . mometasone-formoterol  2 puff Inhalation BID  . pantoprazole  40 mg Oral Daily  . [START ON 08/26/2015] predniSONE  20 mg Oral Q breakfast  . sodium chloride  3 mL Intravenous Q12H  . tiotropium  18 mcg Inhalation Daily   acetaminophen **OR** acetaminophen, albuterol, ALPRAZolam, HYDROcodone-acetaminophen, iohexol, lactulose, lidocaine (PF), nicotine polacrilex, nitroGLYCERIN, ondansetron **OR** ondansetron (ZOFRAN) IV, oxyCODONE-acetaminophen  Assessment/ Plan:  74 y.o. male with hypertension, nephrolithiasis, COPD, GERD, anxiety, AAA repair, recurrent right pleural effusions  1.  Acute renal failure/CKD stage III baseline EGFR 48/Proteinuria/hydronephrosis.   -  Hydronephrosis could certainly be resulting in some acute renal failure now though we suspect that the hydronephrosis has been quite  chronic in nature. Percutaneous nephrostomy placed 08/25/15. Follow renal function now that nephrostomy has been place.  Renal function actually worse this AM.  No urgent indication for dialysis at the moment. Avoid nephrotoxins as possible.  Continue ceftriaxone at this time as well.  2.  Hypertension:  Blood pressure 117/69.  Continue Coreg.  3. Hyperkalemia. otassium down to 4.0 today.  Continue to monitor.     LOS: 3 Caralyn Twining 12/14/20164:37 PM

## 2015-08-25 NOTE — H&P (Signed)
Chief Complaint: Patient was seen in consultation today for worsening renal function and right hydronephrosis.   History of Present Illness: Collin [name] is a 74 y.o. male with multiple medical problems including renal stones and worsening renal function.  Patient has right hydronephrosis despite having a right ureter stent.  The right hydronephrosis has minimally changed since 06/07/15 but patient's renal function is worsening.  Need to exclude obstructive uropathy as a cause of the renal failure.  History of shortness of breath and patient has a right chest Pleurx catheter in place.    Past Medical History  Diagnosis Date  . COPD (chronic obstructive pulmonary disease) ([place])   . Chronic systolic CHF (congestive heart failure) ([place])   . Kidney stone   . Macular degeneration   . HLD (hyperlipidemia)   . Hypertensive retinopathy   . HTN (hypertension)   . GERD (gastroesophageal reflux disease)     Past Surgical History  Procedure Laterality Date  . Abdominal aortic aneurysm repair    . Hernia repair    . Kidney stone surgery    . Chest tube insertion Right 07/07/2015    Procedure: INSERTION PLEURAL DRAINAGE CATHETER;  Surgeon: Nestor Lewandowsky, MD;  Location: [facility] ORS;  Service: Thoracic;  Laterality: Right;    Allergies: Lisinopril and Metoprolol  Medications: Prior to Admission medications   Medication Sig Start Date End Date Taking? Authorizing Provider  ADVAIR DISKUS 100-50 MCG/DOSE AEPB Inhale 1 puff into the lungs 2 (two) times daily. 07/30/15  Yes Historical Provider, MD  albuterol (PROVENTIL HFA;VENTOLIN HFA) 108 (90 BASE) MCG/ACT inhaler Inhale 2 puffs into the lungs every 6 (six) hours as needed for wheezing or shortness of breath.    Yes Historical Provider, MD  ALPRAZolam Duanne Moron) 0.25 MG tablet Take 0.25 mg by mouth at bedtime as needed. For sleep   Yes Historical Provider, MD  aspirin 81 MG tablet Take 81 mg by mouth daily as needed.    Yes Historical Provider, MD    carvedilol (COREG) 6.25 MG tablet Take 6.25 mg by mouth 2 (two) times daily with a meal.  06/28/15 06/27/16 Yes Historical Provider, MD  furosemide (LASIX) 20 MG tablet Take 1 tablet (20 mg total) by mouth daily. Patient taking differently: Take 20-40 mg by mouth See admin instructions. Take 2 tablets (40mg ) orally once a day (in the morning), and take 1 tablet orally once a day at bedtime. 06/14/15  Yes Fritzi Mandes, MD  Multiple Vitamin (MULTIVITAMIN) tablet Take 1 tablet by mouth daily.   Yes Historical Provider, MD  nitroGLYCERIN (NITROSTAT) 0.4 MG SL tablet Take 0.4 mg by mouth every 5 (five) minutes x 3 doses as needed. For chest pain. If no relief call MD or go to emergency room. 10/25/12  Yes Historical Provider, MD  omeprazole (PRILOSEC) 20 MG capsule Take 20 mg by mouth 2 (two) times daily before a meal.   Yes Historical Provider, MD  tiotropium (SPIRIVA HANDIHALER) 18 MCG inhalation capsule Place 1 capsule (18 mcg total) into inhaler and inhale daily. 03/06/15  Yes Srikar Sudini, MD  oxyCODONE-acetaminophen (PERCOCET/ROXICET) 5-325 MG tablet Take 1-2 tablets by mouth every 4 (four) hours as needed for moderate pain. Patient not taking: Reported on 07/15/2015 07/08/15   Nestor Lewandowsky, MD     Family History  Problem Relation Age of Onset  . Heart attack Mother   . Stroke Mother   . Cancer Father   . COPD Father   . Heart attack Father  Social History   Social History  . Marital Status: Married    Spouse Name: N/A  . Number of Children: N/A  . Years of Education: N/A   Social History Main Topics  . Smoking status: Current Every Day Smoker -- 0.25 packs/day for 45 years    Types: Cigarettes  . Smokeless tobacco: Never Used  . Alcohol Use: No  . Drug Use: No  . Sexual Activity: Not Asked   Other Topics Concern  . None   Social History Narrative      Review of Systems  Respiratory: Positive for shortness of breath.   Musculoskeletal:       Right flank and chest pain     Vital Signs: BP 107/65 mmHg  Pulse 81  Temp(Src) 97.7 F (36.5 C) (Oral)  Resp 16  Ht 6\' 2"  (1.88 m)  Wt 197 lb 11.2 oz (89.676 kg)  BMI 25.37 kg/m2  SpO2 91%  Physical Exam  Constitutional:  Appears weak and tired.   Cardiovascular: Normal rate, regular rhythm and normal heart sounds.   Pulmonary/Chest: Effort normal.  Few crackles in right lung  Abdominal: Soft.  Right flank tenderness, near Pleurx catheter.    Mallampati Score:  MD Evaluation Airway: WNL Heart: WNL Abdomen: WNL Chest/ Lungs: WNL ASA  Classification: 2  Imaging: Dg Chest 2 View  08/22/2015  CLINICAL DATA:  Shortness of Breath EXAM: CHEST  2 VIEW COMPARISON:  July 07, 2015 FINDINGS: There is a pleuravac catheter at the right base. There is a small pleural effusion on the right. There is generalized interstitial edema. There is no alveolar/airspace consolidation. There is cardiomegaly with pulmonary venous hypertension. No adenopathy. There is atherosclerotic calcification in the aorta. IMPRESSION: Evidence of a degree of congestive heart failure. Pleuravac type catheter right base. No pneumothorax. Electronically Signed   By: Lowella Grip III M.D.   On: 08/22/2015 14:50   US Renal  08/23/2015  CLINICAL DATA:  Acute renal failure. Urolithiasis and hydronephrosis. Chronic systolic congestive heart failure. EXAM: RENAL / URINARY TRACT ULTRASOUND COMPLETE COMPARISON:  Noncontrast CT on 06/06/2015 FINDINGS: Right Kidney: Length: 13.9 cm. A 2 cm shadowing calculus is seen in the upper pole of the right kidney. Moderate right hydronephrosis is seen with the right ureteral stent in place. This appears similar to previous CT. Left Kidney: Length: 10.1 cm. Several small less than 1 cm shadowing calculi are seen. A 1.8 cm cyst is also seen in the midpole. No definite renal mass identified. No evidence of hydronephrosis. Bladder: Empty with Foley catheter in place.  Small amount of ascites noted. IMPRESSION:  Moderate right hydronephrosis with right ureteral stent in place. This is similar in appearance to recent CT. No left hydronephrosis. Bilateral nephrolithiasis. Mild ascites. Electronically Signed   By: Earle Gell M.D.   On: 08/23/2015 15:43    Labs:  CBC:  Recent Labs  08/22/15 1322 08/23/15 0602 08/24/15 0520 08/25/15 0519  WBC 14.7* 24.4* 20.6* 15.3*  HGB 14.1 13.0 12.4* 11.4*  HCT 46.2 42.3 40.1 36.9*  PLT 253 215 196 184    COAGS:  Recent Labs  03/20/15 0414 06/12/15 1147 07/06/15 0926 08/24/15 0520  INR 1.12 1.17 1.22 1.27  APTT 35  --  32  --     BMP:  Recent Labs  08/23/15 1842 08/24/15 0520 08/24/15 1332 08/25/15 0519  NA 128* 131* 133* 127*  K 5.9* 6.2* 5.1 4.0  CL 101 100* 96* 96*  CO2 18* 20* 22 20*  GLUCOSE 145* 85 105* 117*  BUN 56* 66* 71* 76*  CALCIUM 8.4* 8.6* 8.6* 7.9*  CREATININE 3.79* 4.28* 4.62* 4.99*  GFRNONAA 15* 13* 11* 10*  GFRAA 17* 15* 13* 12*    LIVER FUNCTION TESTS:  Recent Labs  06/06/15 1050 06/12/15 1147 07/06/15 0926 07/08/15 0527  BILITOT 1.4* 1.0 1.0 0.5  AST 22 21 23 15   ALT 14* 19 15* 12*  ALKPHOS 118 139* 133* 104  PROT 7.1 7.4 7.2 6.0*  ALBUMIN 4.0 4.0 4.1 3.3*    TUMOR MARKERS: No results for input(s): AFPTM, CEA, CA199, CHROMGRNA in the last 8760 hours.  Assessment and Plan:  74 yo with worsening renal function and known kidney stones with right hydronephrosis.  Discussed percutaneous right nephrostomy tube placement with patient and wife.  Informed consent obtained from patient.  Plan for right percutaneous nephrostomy tube placement with moderate sedation.      SignedCarylon Perches 08/25/2015, 10:34 AM

## 2015-08-25 NOTE — Progress Notes (Signed)
Pt requesting to speak with Dr. Erlene Quan about procedure today. Pt aware of time of procedure. Dr. Erlene Quan paged and made aware, and to call into the room and speak with patient.

## 2015-08-26 ENCOUNTER — Inpatient Hospital Stay: Payer: Medicare Other

## 2015-08-26 DIAGNOSIS — I5022 Chronic systolic (congestive) heart failure: Secondary | ICD-10-CM | POA: Diagnosis not present

## 2015-08-26 DIAGNOSIS — Z438 Encounter for attention to other artificial openings: Secondary | ICD-10-CM | POA: Diagnosis not present

## 2015-08-26 DIAGNOSIS — J440 Chronic obstructive pulmonary disease with acute lower respiratory infection: Secondary | ICD-10-CM | POA: Diagnosis not present

## 2015-08-26 DIAGNOSIS — J90 Pleural effusion, not elsewhere classified: Secondary | ICD-10-CM | POA: Diagnosis not present

## 2015-08-26 LAB — CBC
HCT: 36.3 % — ABNORMAL LOW (ref 40.0–52.0)
HEMOGLOBIN: 11.2 g/dL — AB (ref 13.0–18.0)
MCH: 22.8 pg — ABNORMAL LOW (ref 26.0–34.0)
MCHC: 30.8 g/dL — AB (ref 32.0–36.0)
MCV: 74 fL — ABNORMAL LOW (ref 80.0–100.0)
Platelets: 171 10*3/uL (ref 150–440)
RBC: 4.91 MIL/uL (ref 4.40–5.90)
RDW: 19.5 % — ABNORMAL HIGH (ref 11.5–14.5)
WBC: 13.5 10*3/uL — AB (ref 3.8–10.6)

## 2015-08-26 LAB — BASIC METABOLIC PANEL
ANION GAP: 14 (ref 5–15)
BUN: 83 mg/dL — ABNORMAL HIGH (ref 6–20)
CALCIUM: 8.4 mg/dL — AB (ref 8.9–10.3)
CO2: 20 mmol/L — ABNORMAL LOW (ref 22–32)
Chloride: 97 mmol/L — ABNORMAL LOW (ref 101–111)
Creatinine, Ser: 5.14 mg/dL — ABNORMAL HIGH (ref 0.61–1.24)
GFR, EST AFRICAN AMERICAN: 12 mL/min — AB (ref >60)
GFR, EST NON AFRICAN AMERICAN: 10 mL/min — AB (ref >60)
GLUCOSE: 205 mg/dL — AB (ref 65–99)
Potassium: 4.4 mmol/L (ref 3.5–5.1)
SODIUM: 131 mmol/L — AB (ref 135–145)

## 2015-08-26 MED ORDER — BUDESONIDE 0.25 MG/2ML IN SUSP
0.2500 mg | Freq: Two times a day (BID) | RESPIRATORY_TRACT | Status: DC
  Administered 2015-08-26 – 2015-08-31 (×11): 0.25 mg via RESPIRATORY_TRACT
  Filled 2015-08-26 (×11): qty 2

## 2015-08-26 MED ORDER — HEPARIN SODIUM (PORCINE) 5000 UNIT/ML IJ SOLN
5000.0000 [IU] | Freq: Two times a day (BID) | INTRAMUSCULAR | Status: DC
  Administered 2015-08-26 – 2015-08-31 (×10): 5000 [IU] via SUBCUTANEOUS
  Filled 2015-08-26 (×10): qty 1

## 2015-08-26 MED ORDER — VANCOMYCIN HCL IN DEXTROSE 1-5 GM/200ML-% IV SOLN
1000.0000 mg | Freq: Once | INTRAVENOUS | Status: AC
  Administered 2015-08-26: 1000 mg via INTRAVENOUS
  Filled 2015-08-26: qty 200

## 2015-08-26 MED ORDER — POLYETHYLENE GLYCOL 3350 17 G PO PACK
17.0000 g | PACK | Freq: Every day | ORAL | Status: DC
  Administered 2015-08-26 – 2015-08-31 (×5): 17 g via ORAL
  Filled 2015-08-26 (×6): qty 1

## 2015-08-26 MED ORDER — GUAIFENESIN-DM 100-10 MG/5ML PO SYRP
5.0000 mL | ORAL_SOLUTION | Freq: Three times a day (TID) | ORAL | Status: DC | PRN
  Administered 2015-08-26 – 2015-08-30 (×2): 5 mL via ORAL
  Filled 2015-08-26 (×2): qty 5

## 2015-08-26 NOTE — Plan of Care (Signed)
Problem: Fluid Volume: Goal: Ability to maintain a balanced intake and output will improve Outcome: Progressing Pt. Wife draining PleurX daily.

## 2015-08-26 NOTE — Progress Notes (Signed)
Urology Consult Follow Up  Subjective: Right nephrostomy tube placed yesterday in the setting of rising Cr.  Tolerated procedure well. Patient reports feeling better today. He is concerned about adjusting his Lasix dosage when he is discharged.  Good urine output from nephrostomy tube. Some decrease and right flank pain. VSS.  Anti-infectives: Anti-infectives    Start     Dose/Rate Route Frequency Ordered Stop   08/25/15 1600  fluconazole (DIFLUCAN) IVPB 200 mg     200 mg 100 mL/hr over 60 Minutes Intravenous Every 24 hours 08/25/15 1519     08/25/15 1045  ciprofloxacin (CIPRO) IVPB 400 mg  Status:  Discontinued     400 mg 200 mL/hr over 60 Minutes Intravenous Every 12 hours 08/25/15 1034 08/25/15 1043   08/25/15 1045  ciprofloxacin (CIPRO) IVPB 400 mg     400 mg 200 mL/hr over 60 Minutes Intravenous Once 08/25/15 1043 08/25/15 1145   08/25/15 1040  ciprofloxacin (CIPRO) 400 MG/200ML IVPB    Comments:  Darlin Drop: cabinet override      08/25/15 1040 08/25/15 1045   08/23/15 1800  cefTRIAXone (ROCEPHIN) 1 g in dextrose 5 % 50 mL IVPB     1 g 100 mL/hr over 30 Minutes Intravenous Every 24 hours 08/22/15 1752     08/22/15 1630  cefTRIAXone (ROCEPHIN) 1 g in dextrose 5 % 50 mL IVPB  Status:  Discontinued     1 g 100 mL/hr over 30 Minutes Intravenous Every 24 hours 08/22/15 1624 08/22/15 1752   08/22/15 1630  azithromycin (ZITHROMAX) 500 mg in dextrose 5 % 250 mL IVPB     500 mg 250 mL/hr over 60 Minutes Intravenous Every 24 hours 08/22/15 1624     08/22/15 1600  cefTRIAXone (ROCEPHIN) 1 g in dextrose 5 % 50 mL IVPB     1 g 100 mL/hr over 30 Minutes Intravenous  Once 08/22/15 1553 08/22/15 1802      Current Facility-Administered Medications  Medication Dose Route Frequency Provider Last Rate Last Dose  . 0.9 %  sodium chloride infusion   Intravenous Continuous Markus Daft, MD 10 mL/hr at 08/25/15 1033    . acetaminophen (TYLENOL) tablet 650 mg  650 mg Oral Q6H PRN Theodoro Grist, MD        Or  . acetaminophen (TYLENOL) suppository 650 mg  650 mg Rectal Q6H PRN Theodoro Grist, MD      . albuterol (PROVENTIL) (2.5 MG/3ML) 0.083% nebulizer solution 2.5 mg  2.5 mg Nebulization Q6H PRN Theodoro Grist, MD      . ALPRAZolam Duanne Moron) tablet 0.25 mg  0.25 mg Oral QHS PRN Theodoro Grist, MD   0.25 mg at 08/23/15 2256  . azithromycin (ZITHROMAX) 500 mg in dextrose 5 % 250 mL IVPB  500 mg Intravenous Q24H Theodoro Grist, MD   500 mg at 08/25/15 1650  . carvedilol (COREG) tablet 12.5 mg  12.5 mg Oral BID WC Demetrios Loll, MD   12.5 mg at 08/26/15 [id number]  . cefTRIAXone (ROCEPHIN) 1 g in dextrose 5 % 50 mL IVPB  1 g Intravenous Q24H Theodoro Grist, MD   1 g at 08/25/15 1650  . docusate sodium (COLACE) capsule 100 mg  100 mg Oral BID Theodoro Grist, MD   100 mg at 08/26/15 0838  . fluconazole (DIFLUCAN) IVPB 200 mg  200 mg Intravenous Q24H Lenis Noon, RPH   200 mg at 08/25/15 1539  . HYDROcodone-acetaminophen (NORCO/VICODIN) 5-325 MG per tablet 1-2 tablet  1-2 tablet Oral Q4H PRN  Theodoro Grist, MD   1 tablet at 08/23/15 [phone]  . iohexol (OMNIPAQUE) 300 MG/ML solution 50 mL  50 mL Other Once PRN Markus Daft, MD      . lactulose (CHRONULAC) 10 GM/15ML solution 20 g  20 g Oral BID PRN Demetrios Loll, MD      . lidocaine (PF) (XYLOCAINE) 1 % injection    PRN Markus Daft, MD   10 mL at 08/25/15 1127  . mometasone-formoterol (DULERA) 100-5 MCG/ACT inhaler 2 puff  2 puff Inhalation BID Theodoro Grist, MD   2 puff at 08/26/15 0839  . nicotine polacrilex (NICORETTE) gum 2 mg  2 mg Oral PRN Theodoro Grist, MD      . nitroGLYCERIN (NITROSTAT) SL tablet 0.4 mg  0.4 mg Sublingual Q5 min PRN Theodoro Grist, MD      . ondansetron (ZOFRAN) tablet 4 mg  4 mg Oral Q6H PRN Theodoro Grist, MD       Or  . ondansetron (ZOFRAN) injection 4 mg  4 mg Intravenous Q6H PRN Theodoro Grist, MD   4 mg at 08/23/15 1422  . oxyCODONE-acetaminophen (PERCOCET/ROXICET) 5-325 MG per tablet 1-2 tablet  1-2 tablet Oral Q4H PRN Theodoro Grist, MD   2 tablet  at 08/26/15 0545  . pantoprazole (PROTONIX) EC tablet 40 mg  40 mg Oral Daily Theodoro Grist, MD   40 mg at 08/26/15 [id number]  . predniSONE (DELTASONE) tablet 20 mg  20 mg Oral Q breakfast Demetrios Loll, MD   20 mg at 08/26/15 [id number]  . sodium chloride 0.9 % injection 3 mL  3 mL Intravenous Q12H Theodoro Grist, MD   3 mL at 08/26/15 0840  . tiotropium (SPIRIVA) inhalation capsule 18 mcg  18 mcg Inhalation Daily Theodoro Grist, MD   18 mcg at 08/26/15 0839     Objective: Vital signs in last 24 hours: Temp:  [97.4 F (36.3 C)-97.8 F (36.6 C)] 97.8 F (36.6 C) (12/15 0425) Pulse Rate:  [78-86] 81 (12/15 0425) Resp:  [10-27] 18 (12/15 0425) BP: (107-133)/(59-80) 114/67 mmHg (12/15 0425) SpO2:  [90 %-98 %] 94 % (12/15 0425) FiO2 (%):  [92 %] 92 % (12/14 1015) Weight:  [199 lb 6.4 oz (90.447 kg)] 199 lb 6.4 oz (90.447 kg) (12/15 0425)  Intake/Output from previous day: 12/14 0701 - 12/15 0700 In: 790 [P.O.:240] Out: 2025 [Urine:825; Drains:1200] Intake/Output this shift:     Physical Exam Constitutional: Well nourished. Alert and oriented, No acute distress. HEENT: [place] AT, moist mucus membranes. Trachea midline, no masses. Cardiovascular: No clubbing, cyanosis, or edema. Respiratory: + Increased WOB, wearing 02.   GI: Abdomen is soft, non tender, non distended, no abdominal masses, acities appreciated.   GU: Right CVA tenderness with nephrostomy tube in place draining cloudy yellow urine.  Site is clean and dry and intact. Foley catheter draining less cloudy yellow urine.  Skin: + arm bruising Neurologic: Grossly intact, no focal deficits, moving all 4 extremities. Psychiatric: Normal mood and affect.  Lab Results:   Recent Labs  08/24/15 0520 08/25/15 0519  WBC 20.6* 15.3*  HGB 12.4* 11.4*  HCT 40.1 36.9*  PLT 196 184   BMET  Recent Labs  08/24/15 1332 08/25/15 0519  NA 133* 127*  K 5.1 4.0  CL 96* 96*  CO2 22 20*  GLUCOSE 105* 117*  BUN 71* 76*  CREATININE 4.62* 4.99*   CALCIUM 8.6* 7.9*   PT/INR  Recent Labs  08/24/15 0520  LABPROT 16.0*  INR 1.27   ABG No  results for input(s): PHART, HCO3 in the last 72 hours.  Invalid input(s): PCO2, PO2  Studies/Results: [place] Intraoperative  08/25/2015  CLINICAL DATA:  Ultrasound was provided for use by the ordering physician, and a technical charge was applied by the performing facility.  No radiologist interpretation/professional services rendered.   Ir Nephrostomy Placement Right  08/25/2015  CLINICAL DATA:  74 year old with right hydronephrosis and worsening renal function. Known right kidney stones. EXAM: PLACEMENT OF RIGHT PERCUTANEOUS NEPHROSTOMY TUBE WITH ULTRASOUND AND FLUOROSCOPIC GUIDANCE Physician: Stephan Minister. Anselm Pancoast, MD FLUOROSCOPY TIME:  48 seconds MEDICATIONS: Ciprofloxacin 400 mg. Ciprofloxacin was given within two hours of incision. 1 mg Versed, 50 mcg fentanyl. A radiology nurse monitored the patient for moderate sedation. ANESTHESIA/SEDATION: Moderate sedation time: 18 minutes PROCEDURE: The procedure was explained to the patient. The risks and benefits of the procedure were discussed and the patient's questions were addressed. Informed consent was obtained from the patient. Patient was placed prone on the interventional table. The right kidney with identified with ultrasound and the skin was marked. The right flank was prepped and draped in sterile fashion. Maximal barrier sterile technique was utilized including caps, mask, sterile gowns, sterile gloves, sterile drape, hand hygiene and skin antiseptic. Skin was anesthetized with 1% lidocaine. A 21 gauge needle was directed into a dilated mid/upper pole calyx with ultrasound guidance. Needle placement confirmed within the calyx. Purulent or "milky" urine was coming out of the needle. A 0.018 wire was advanced into the renal collecting system. Needle was exchanged for an Accustick dilator set. The tract was dilated to accommodate a 10 [place] drain. A 10.2  Leonides Schanz catheter was advanced over a J-wire into the region of the renal pelvis. Greater than 80 mL of the "milky" purulent fluid was removed from the kidney. Fluid was sent for culture. Catheter was sutured to the skin and a sterile dressing was placed. Fluoroscopic and ultrasound images were taken and saved for documentation. FINDINGS: Moderate right hydronephrosis by ultrasound. Needle directed into the mid/upper pole calyx. Greater than 80 mL of purulent fluid was removed from the right kidney. Right kidney was at least partially decompressed following placement of the nephrostomy tube. There is a right ureter stent near the renal pelvis. There is a staghorn-type calculus or calculi in the right kidney. COMPLICATIONS: None IMPRESSION: Successful percutaneous right nephrostomy tube placement with ultrasound and fluoroscopic guidance. Purulent fluid was removed from the right kidney. Findings are suggestive for pyonephrosis. Electronically Signed   By: Markus Daft M.D.   On: 08/25/2015 12:14     Assessment: POD1 1.  Acute on chronic renal failure status post right nephrostomy tube placement 2. Leukocytosis, improving, likely secondary to infection versus steroids 3. Possible right pyelonephritis, on antibiotics, cultures pending, Afebrile 4. Bilateral nephrolithiasis with right encrusted, retained ureteral stent-will ultimately need right ureteroscopy to remove right-sided stent and treat right-sided obstructing stones  Plan: - follow-up labs in a.m.- awaiting lab results today 08/26/15 - Monitor urine output, at risk for postobstructive diuresis - No additional surgical intervention for obstructing stones, retained stent at this time given pyelonephrosis - Continue antibiotics - Consider addition of antifungal should patient decompensate - Urology to follow this patient - Defer Lasix dosage adjustment to Nephrology/cardiology    LOS: 4 days    Herbert Moors 08/26/2015

## 2015-08-26 NOTE — Progress Notes (Signed)
ANTIBIOTIC CONSULT NOTE - INITIAL  Pharmacy Consult for vancomycin Indication: pneumonia  Allergies  Allergen Reactions  . Lisinopril Swelling    Pt reports throat swelling.  . Metoprolol     Other reaction(s): Other (See Comments) Caused ringing in ears    Patient Measurements: Height: 6\' 2"  (188 cm) Weight: 199 lb 6.4 oz (90.447 kg) IBW/kg (Calculated) : 82.2 Adjusted Body Weight: n/a  Vital Signs: Temp: 97.5 F (36.4 C) (12/15 1108) Temp Source: Oral (12/15 1108) BP: 113/63 mmHg (12/15 1108) Pulse Rate: 78 (12/15 1108)  Labs:  Recent Labs  08/24/15 0520 08/24/15 1332 08/25/15 0519 08/26/15 0840  WBC 20.6*  --  15.3* 13.5*  HGB 12.4*  --  11.4* 11.2*  PLT 196  --  184 171  CREATININE 4.28* 4.62* 4.99* 5.14*   Estimated Creatinine Clearance: 15.1 mL/min (by C-G formula based on Cr of 5.14). No results for input(s): VANCOTROUGH, VANCOPEAK, VANCORANDOM, GENTTROUGH, GENTPEAK, GENTRANDOM, TOBRATROUGH, TOBRAPEAK, TOBRARND, AMIKACINPEAK, AMIKACINTROU, AMIKACIN in the last 72 hours.   Assessment: Pharmacy consulted to dose vancomycin in this 74 year old male for pneumonia. Patient is currently being treated for both PNA and UTI and had a nephrostomy tube placed yesterday. Per MD, patient is coughing up green sputum/material today and is adding vancomycin.  Current anti-infectives are as follows: Day #5 azithromycin 500 IV daily (start: 12/11) for PNA Day #5 ceftriaxone 1 g IV daily (start: 12/11) for PNA/UTI Day #2 fluconazole 200 mg IV daily (start: 12/14) for yeast in urine after invasive procedure Day #1 vancomycin (start: 12/15) for PNA  Patient has poor renal function, with a CrCl of ~ 15 mL/min and is not on HD  Goal of Therapy:  Vancomycin trough level 15-20 mcg/ml  Plan:  Give vancomycin 1000 mg x1 dose (~11 mg/kg) Given renal function, will dose off of random vancomycin levels Ordered a 24 hour vancomycin level for tomorrow at 1330  Pharmacy will  continue to monitor, thank you for the consult.  [name] Qualyn Oyervides 08/26/2015,2:38 PM

## 2015-08-26 NOTE — Progress Notes (Signed)
Patient ID: Collin [name], male   DOB: 1941/01/11, 74 y.o.   MRN: [id number] [facility] PROGRESS NOTE  PCP: [name], MD  HPI/Subjective: Patient coughing up quite a bit of phlegm, brown-green in nature. He feels short of breath. When I saw him earlier his wife was straining his pleural effusion with his catheter. Patient feels horrible. He states he is very swollen.  Objective: Filed Vitals:   08/26/15 0425 08/26/15 1108  BP: 114/67 113/63  Pulse: 81 78  Temp: 97.8 F (36.6 C) 97.5 F (36.4 C)  Resp: 18 12    Filed Weights   08/24/15 0629 08/25/15 0433 08/26/15 0425  Weight: 88.043 kg (194 lb 1.6 oz) 89.676 kg (197 lb 11.2 oz) 90.447 kg (199 lb 6.4 oz)    ROS: Review of Systems  Constitutional: Negative for fever and chills.  Eyes: Negative for blurred vision.  Respiratory: Positive for cough, sputum production and shortness of breath.   Cardiovascular: Positive for chest pain.  Gastrointestinal: Negative for nausea, vomiting, abdominal pain, diarrhea and constipation.  Genitourinary: Negative for dysuria.  Musculoskeletal: Negative for joint pain.  Neurological: Negative for dizziness and headaches.   Exam: Physical Exam  Constitutional: He is oriented to person, place, and time.  HENT:  Nose: No mucosal edema.  Mouth/Throat: No oropharyngeal exudate or posterior oropharyngeal edema.  Eyes: Conjunctivae, EOM and lids are normal. Pupils are equal, round, and reactive to light.  Neck: No JVD present. Carotid bruit is not present. No edema present. No thyroid mass and no thyromegaly present.  Cardiovascular: S1 normal and S2 normal.  Exam reveals no gallop.   No murmur heard. Pulses:      Dorsalis pedis pulses are 2+ on the right side, and 2+ on the left side.  Respiratory: No respiratory distress. He has wheezes in the right upper field, the right middle field, the left upper field and the left middle field. He has no rhonchi. He has rales in the  right lower field and the left lower field.  GI: Soft. Bowel sounds are normal. There is no tenderness.  Musculoskeletal:       Right ankle: He exhibits swelling.       Left ankle: He exhibits swelling.  Lymphadenopathy:    He has no cervical adenopathy.  Neurological: He is alert and oriented to person, place, and time. No cranial nerve deficit.  Skin: Skin is warm. No rash noted. Nails show no clubbing.  Psychiatric: He has a normal mood and affect.    Data Reviewed: Basic Metabolic Panel:  Recent Labs Lab 08/23/15 1842 08/24/15 0520 08/24/15 1332 08/25/15 0519 08/26/15 0840  NA 128* 131* 133* 127* 131*  K 5.9* 6.2* 5.1 4.0 4.4  CL 101 100* 96* 96* 97*  CO2 18* 20* 22 20* 20*  GLUCOSE 145* 85 105* 117* 205*  BUN 56* 66* 71* 76* 83*  CREATININE 3.79* 4.28* 4.62* 4.99* 5.14*  CALCIUM 8.4* 8.6* 8.6* 7.9* 8.4*   CBC:  Recent Labs Lab 08/22/15 1322 08/23/15 0602 08/24/15 0520 08/25/15 0519 08/26/15 0840  WBC 14.7* 24.4* 20.6* 15.3* 13.5*  HGB 14.1 13.0 12.4* 11.4* 11.2*  HCT 46.2 42.3 40.1 36.9* 36.3*  MCV 75.3* 75.0* 75.2* 74.0* 74.0*  PLT 253 215 196 184 171   Cardiac Enzymes:  Recent Labs Lab 08/22/15 1322 08/22/15 1950 08/23/15 0120 08/23/15 0602  TROPONINI 0.13* 0.04* 0.04* 0.04*   BNP (last 3 results)  Recent Labs  03/19/15 1729 06/12/15 1147 08/22/15 1322  BNP >4500.0* >4500.0* >4500.0*    Recent Results (from the past 240 hour(s))  Blood culture (routine x 2)     Status: None (Preliminary result)   Collection Time: 08/22/15  4:39 PM  Result Value Ref Range Status   Specimen Description BLOOD RIGHT FOREARM  Final   Special Requests   Final    BOTTLES DRAWN AEROBIC AND ANAEROBIC  AER 6CC ANA 2CC   Culture NO GROWTH 4 DAYS  Final   Report Status PENDING  Incomplete  Blood culture (routine x 2)     Status: None (Preliminary result)   Collection Time: 08/22/15  4:40 PM  Result Value Ref Range Status   Specimen Description BLOOD LEFT ARM   Final   Special Requests BOTTLES DRAWN AEROBIC AND ANAEROBIC  1CC  Final   Culture NO GROWTH 4 DAYS  Final   Report Status PENDING  Incomplete  Urine culture     Status: None (Preliminary result)   Collection Time: 08/23/15  3:55 PM  Result Value Ref Range Status   Specimen Description URINE, RANDOM  Final   Special Requests NONE  Final   Culture   Final    80,000 COLONIES/ml YEAST IDENTIFICATION TO FOLLOW    Report Status PENDING  Incomplete  Body fluid culture     Status: None (Preliminary result)   Collection Time: 08/25/15 11:00 AM  Result Value Ref Range Status   Specimen Description FLUID  Final   Special Requests NONE  Final   Gram Stain TOO NUMEROUS TO COUNT WBC SEEN FEW YEAST   Final   Culture HEAVY GROWTH YEAST IDENTIFICATION TO FOLLOW   Final   Report Status PENDING  Incomplete     Studies: Dg Chest 1 View  08/26/2015  CLINICAL DATA:  Cough EXAM: CHEST 1 VIEW COMPARISON:  08/22/2015 chest radiograph. FINDINGS: Stable cardiomediastinal silhouette with mild cardiomegaly. No pneumothorax. Stable small right pleural effusion. New small left pleural effusion. Stable mild pulmonary edema. Left lower lobe opacity, not appreciably changed. IMPRESSION: 1. Stable mild cardiomegaly with mild pulmonary edema, suggesting mild congestive heart failure. 2. Stable small right pleural effusion and new small left pleural effusion. 3. Stable left lower lobe lung opacity, favor atelectasis. Electronically Signed   By: Ilona Sorrel M.D.   On: 08/26/2015 14:32   [place] Intraoperative  08/25/2015  CLINICAL DATA:  Ultrasound was provided for use by the ordering physician, and a technical charge was applied by the performing facility.  No radiologist interpretation/professional services rendered.   Ir Nephrostomy Placement Right  08/25/2015  CLINICAL DATA:  74 year old with right hydronephrosis and worsening renal function. Known right kidney stones. EXAM: PLACEMENT OF RIGHT PERCUTANEOUS  NEPHROSTOMY TUBE WITH ULTRASOUND AND FLUOROSCOPIC GUIDANCE Physician: Stephan Minister. Anselm Pancoast, MD FLUOROSCOPY TIME:  48 seconds MEDICATIONS: Ciprofloxacin 400 mg. Ciprofloxacin was given within two hours of incision. 1 mg Versed, 50 mcg fentanyl. A radiology nurse monitored the patient for moderate sedation. ANESTHESIA/SEDATION: Moderate sedation time: 18 minutes PROCEDURE: The procedure was explained to the patient. The risks and benefits of the procedure were discussed and the patient's questions were addressed. Informed consent was obtained from the patient. Patient was placed prone on the interventional table. The right kidney with identified with ultrasound and the skin was marked. The right flank was prepped and draped in sterile fashion. Maximal barrier sterile technique was utilized including caps, mask, sterile gowns, sterile gloves, sterile drape, hand hygiene and skin antiseptic. Skin was anesthetized with 1% lidocaine. A 21 gauge needle was  directed into a dilated mid/upper pole calyx with ultrasound guidance. Needle placement confirmed within the calyx. Purulent or "milky" urine was coming out of the needle. A 0.018 wire was advanced into the renal collecting system. Needle was exchanged for an Accustick dilator set. The tract was dilated to accommodate a 10 [place] drain. A 10.2 Leonides Schanz catheter was advanced over a J-wire into the region of the renal pelvis. Greater than 80 mL of the "milky" purulent fluid was removed from the kidney. Fluid was sent for culture. Catheter was sutured to the skin and a sterile dressing was placed. Fluoroscopic and ultrasound images were taken and saved for documentation. FINDINGS: Moderate right hydronephrosis by ultrasound. Needle directed into the mid/upper pole calyx. Greater than 80 mL of purulent fluid was removed from the right kidney. Right kidney was at least partially decompressed following placement of the nephrostomy tube. There is a right ureter stent near  the renal pelvis. There is a staghorn-type calculus or calculi in the right kidney. COMPLICATIONS: None IMPRESSION: Successful percutaneous right nephrostomy tube placement with ultrasound and fluoroscopic guidance. Purulent fluid was removed from the right kidney. Findings are suggestive for pyonephrosis. Electronically Signed   By: Markus Daft M.D.   On: 08/25/2015 12:14    Scheduled Meds: . azithromycin  500 mg Intravenous Q24H  . budesonide (PULMICORT) nebulizer solution  0.25 mg Nebulization BID  . carvedilol  12.5 mg Oral BID WC  . cefTRIAXone (ROCEPHIN)  IV  1 g Intravenous Q24H  . docusate sodium  100 mg Oral BID  . fluconazole (DIFLUCAN) IV  200 mg Intravenous Q24H  . heparin subcutaneous  5,000 Units Subcutaneous Q12H  . pantoprazole  40 mg Oral Daily  . polyethylene glycol  17 g Oral Daily  . predniSONE  20 mg Oral Q breakfast  . sodium chloride  3 mL Intravenous Q12H  . tiotropium  18 mcg Inhalation Daily  . vancomycin  1,000 mg Intravenous Once   Continuous Infusions: . sodium chloride 10 mL/hr at 08/25/15 1033    Assessment/Plan:  1. COPD exacerbation versus pneumonia. Start budesonide nebulizers and patient is already on prednisone. Patient is on Rocephin and Zithromax and I will add vancomycin which is going to be renally dosed. 2. 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 Lasix is on hold secondary to renal failure. Patient's wife just drained out his Pleurx chest tube. 3. Acute on chronic renal failure. Foley catheter placed for urinary retention. Urology recommended interventional radiology right nephrostomy tube. 4. Pyelonephritis with yeast- on Diflucan 5. Elevated troponin demand ischemia 6. Hyperkalemia improved 7. Hyponatremia- continue to monitor  Code Status:     Code Status Orders        Start     Ordered   08/22/15 1745  Full code   Continuous     08/22/15 1744    Advance Directive Documentation        Most Recent Value    Type of Advance Directive  Living will   Pre-existing out of facility DNR order (yellow form or pink MOST form)     "MOST" Form in Place?       Family Communication: Wife at the bedside Disposition Plan: To be determined  Consultants:  Cardiothoracic surgery  Cardiology  Nephrology  Urology  Procedures:  Right nephrostomy tube by interventional radiology  Foley catheter placement  Antibiotics:  Rocephin  Zithromax  Diflucan  Vancomycin  Time spent: 30 minutes  [place], [place]  [facility]

## 2015-08-26 NOTE — Care Management Important Message (Signed)
Important Message  Patient Details  Name: Collin [name] MRN: [id number] Date of Birth: 01/14/1941   Medicare Important Message Given:  Yes    Juliann Pulse A Brooke Payes 08/26/2015, 10:30 AM

## 2015-08-26 NOTE — Progress Notes (Signed)
[facility]  ROUNDING NOTE   Subjective:  No new renal function testing today. Nephrostomy does appear to have good drainage. Foley catheter also remains in place   Objective:  Vital signs in last 24 hours:  Temp:  [97.4 F (36.3 C)-97.8 F (36.6 C)] 97.8 F (36.6 C) (12/15 0425) Pulse Rate:  [78-86] 81 (12/15 0425) Resp:  [10-27] 18 (12/15 0425) BP: (107-133)/(59-80) 114/67 mmHg (12/15 0425) SpO2:  [90 %-98 %] 94 % (12/15 0425) Weight:  [90.447 kg (199 lb 6.4 oz)] 90.447 kg (199 lb 6.4 oz) (12/15 0425)  Weight change: 0.771 kg (1 lb 11.2 oz) Filed Weights   08/24/15 0629 08/25/15 0433 08/26/15 0425  Weight: 88.043 kg (194 lb 1.6 oz) 89.676 kg (197 lb 11.2 oz) 90.447 kg (199 lb 6.4 oz)    Intake/Output: I/O last 3 completed shifts: In: 643 [P.O.:240; I.V.:3; Other:550] Out: 2425 [Urine:1225; Drains:1200]   Intake/Output this shift:  Total I/O In: 120 [P.O.:120] Out: 0   Physical Exam: General: NAD, resting in bed  Head: Normocephalic, atraumatic. Moist oral mucosal membranes  Eyes: Anicteric  Neck: Supple, trachea midline  Lungs:  Basilar rales, normal effort, right pleurex in place  Heart: S1S2 no rubs  Abdomen:  Soft, nontender, BS present, right nephrostomy in place  Extremities: trace peripheral edema.  Neurologic: Nonfocal, moving all four extremities  Skin: No lesions       Basic Metabolic Panel:  Recent Labs Lab 08/23/15 1235 08/23/15 1842 08/24/15 0520 08/24/15 1332 08/25/15 0519  NA 130* 128* 131* 133* 127*  K 5.8* 5.9* 6.2* 5.1 4.0  CL 103 101 100* 96* 96*  CO2 18* 18* 20* 22 20*  GLUCOSE 187* 145* 85 105* 117*  BUN 59* 56* 66* 71* 76*  CREATININE 3.64* 3.79* 4.28* 4.62* 4.99*  CALCIUM 8.7* 8.4* 8.6* 8.6* 7.9*    Liver Function Tests: No results for input(s): AST, ALT, ALKPHOS, BILITOT, PROT, ALBUMIN in the last 168 hours. No results for input(s): LIPASE, AMYLASE in the last 168 hours. No results for input(s): AMMONIA in  the last 168 hours.  CBC:  Recent Labs Lab 08/22/15 1322 08/23/15 0602 08/24/15 0520 08/25/15 0519  WBC 14.7* 24.4* 20.6* 15.3*  HGB 14.1 13.0 12.4* 11.4*  HCT 46.2 42.3 40.1 36.9*  MCV 75.3* 75.0* 75.2* 74.0*  PLT 253 215 196 184    Cardiac Enzymes:  Recent Labs Lab 08/22/15 1322 08/22/15 1950 08/23/15 0120 08/23/15 0602  TROPONINI 0.13* 0.04* 0.04* 0.04*    BNP: Invalid input(s): POCBNP  CBG: No results for input(s): GLUCAP in the last 168 hours.  Microbiology: Results for orders placed or performed during the hospital encounter of 08/22/15  Blood culture (routine x 2)     Status: None (Preliminary result)   Collection Time: 08/22/15  4:39 PM  Result Value Ref Range Status   Specimen Description BLOOD RIGHT FOREARM  Final   Special Requests   Final    BOTTLES DRAWN AEROBIC AND ANAEROBIC  AER 6CC ANA 2CC   Culture NO GROWTH 4 DAYS  Final   Report Status PENDING  Incomplete  Blood culture (routine x 2)     Status: None (Preliminary result)   Collection Time: 08/22/15  4:40 PM  Result Value Ref Range Status   Specimen Description BLOOD LEFT ARM  Final   Special Requests BOTTLES DRAWN AEROBIC AND ANAEROBIC  1CC  Final   Culture NO GROWTH 4 DAYS  Final   Report Status PENDING  Incomplete  Urine  culture     Status: None (Preliminary result)   Collection Time: 08/23/15  3:55 PM  Result Value Ref Range Status   Specimen Description URINE, RANDOM  Final   Special Requests NONE  Final   Culture   Final    80,000 COLONIES/ml YEAST IDENTIFICATION TO FOLLOW    Report Status PENDING  Incomplete  Body fluid culture     Status: None (Preliminary result)   Collection Time: 08/25/15 11:00 AM  Result Value Ref Range Status   Specimen Description FLUID  Final   Special Requests NONE  Final   Gram Stain PENDING  Incomplete   Culture HEAVY GROWTH YEAST IDENTIFICATION TO FOLLOW   Final   Report Status PENDING  Incomplete    Coagulation Studies:  Recent Labs   08/24/15 0520  LABPROT 16.0*  INR 1.27    Urinalysis:  Recent Labs  08/23/15 1555  COLORURINE YELLOW*  LABSPEC 1.015  PHURINE 5.0  GLUCOSEU NEGATIVE  HGBUR 2+*  BILIRUBINUR NEGATIVE  KETONESUR NEGATIVE  PROTEINUR 100*  NITRITE NEGATIVE  LEUKOCYTESUR 2+*      Imaging: [place] Intraoperative  08/25/2015  CLINICAL DATA:  Ultrasound was provided for use by the ordering physician, and a technical charge was applied by the performing facility.  No radiologist interpretation/professional services rendered.   Ir Nephrostomy Placement Right  08/25/2015  CLINICAL DATA:  74 year old with right hydronephrosis and worsening renal function. Known right kidney stones. EXAM: PLACEMENT OF RIGHT PERCUTANEOUS NEPHROSTOMY TUBE WITH ULTRASOUND AND FLUOROSCOPIC GUIDANCE Physician: Stephan Minister. Anselm Pancoast, MD FLUOROSCOPY TIME:  48 seconds MEDICATIONS: Ciprofloxacin 400 mg. Ciprofloxacin was given within two hours of incision. 1 mg Versed, 50 mcg fentanyl. A radiology nurse monitored the patient for moderate sedation. ANESTHESIA/SEDATION: Moderate sedation time: 18 minutes PROCEDURE: The procedure was explained to the patient. The risks and benefits of the procedure were discussed and the patient's questions were addressed. Informed consent was obtained from the patient. Patient was placed prone on the interventional table. The right kidney with identified with ultrasound and the skin was marked. The right flank was prepped and draped in sterile fashion. Maximal barrier sterile technique was utilized including caps, mask, sterile gowns, sterile gloves, sterile drape, hand hygiene and skin antiseptic. Skin was anesthetized with 1% lidocaine. A 21 gauge needle was directed into a dilated mid/upper pole calyx with ultrasound guidance. Needle placement confirmed within the calyx. Purulent or "milky" urine was coming out of the needle. A 0.018 wire was advanced into the renal collecting system. Needle was exchanged for an  Accustick dilator set. The tract was dilated to accommodate a 10 [place] drain. A 10.2 Leonides Schanz catheter was advanced over a J-wire into the region of the renal pelvis. Greater than 80 mL of the "milky" purulent fluid was removed from the kidney. Fluid was sent for culture. Catheter was sutured to the skin and a sterile dressing was placed. Fluoroscopic and ultrasound images were taken and saved for documentation. FINDINGS: Moderate right hydronephrosis by ultrasound. Needle directed into the mid/upper pole calyx. Greater than 80 mL of purulent fluid was removed from the right kidney. Right kidney was at least partially decompressed following placement of the nephrostomy tube. There is a right ureter stent near the renal pelvis. There is a staghorn-type calculus or calculi in the right kidney. COMPLICATIONS: None IMPRESSION: Successful percutaneous right nephrostomy tube placement with ultrasound and fluoroscopic guidance. Purulent fluid was removed from the right kidney. Findings are suggestive for pyonephrosis. Electronically Signed   By: Quita Skye  Anselm Pancoast M.D.   On: 08/25/2015 12:14     Medications:   . sodium chloride 10 mL/hr at 08/25/15 1033   . azithromycin  500 mg Intravenous Q24H  . carvedilol  12.5 mg Oral BID WC  . cefTRIAXone (ROCEPHIN)  IV  1 g Intravenous Q24H  . docusate sodium  100 mg Oral BID  . fluconazole (DIFLUCAN) IV  200 mg Intravenous Q24H  . mometasone-formoterol  2 puff Inhalation BID  . pantoprazole  40 mg Oral Daily  . predniSONE  20 mg Oral Q breakfast  . sodium chloride  3 mL Intravenous Q12H  . tiotropium  18 mcg Inhalation Daily   acetaminophen **OR** acetaminophen, albuterol, ALPRAZolam, HYDROcodone-acetaminophen, iohexol, lactulose, lidocaine (PF), nicotine polacrilex, nitroGLYCERIN, ondansetron **OR** ondansetron (ZOFRAN) IV, oxyCODONE-acetaminophen  Assessment/ Plan:  74 y.o. male with hypertension, nephrolithiasis, COPD, GERD, anxiety, AAA repair,  recurrent right pleural effusions  1.  Acute renal failure/CKD stage III baseline EGFR 48/Proteinuria/hydronephrosis.   -  Hydronephrosis could certainly be resulting in some acute renal failure now though we suspect that the hydronephrosis has been quite chronic in nature. Percutaneous nephrostomy placed 08/25/15. -  Awaiting new renal function testing today. We are hopeful that renal function will start to improve now that nephrostomy has been placed. The contents of the nephrostomy appear less cloudy today. Continue ceftriaxone for underlying infection. Await further urology input as well.  2.  Hypertension:  Blood pressure 114/67, continue Coreg 12.5 mg by mouth twice a day.  3. Hyperkalemia. We will repeat potassium today.     LOS: 4 Collin [name] 12/15/201610:43 AM

## 2015-08-26 NOTE — Progress Notes (Signed)
Interventional Radiology Progress Note    74 year old male, SP right PCN placement, with pyonephrosis.    Currently the patient is feeling better.  Denies rigors/chills.   Clear urine draining into the right PCN drain.  Clear urine into the foley bag.    Patient reports that there are future plans for stent removal with urology.   Recommendations:  - Maintain PCN to gravity drainage - Will coordinate with urology for removal once his occluded stent is removed.  - Agree with current care.   Call with questions/concerns.  Signed,  Dulcy Fanny. Earleen Newport, DO

## 2015-08-26 NOTE — Progress Notes (Signed)
[facility]  SUBJECTIVE: Complains a right flank pain where his  nephrostomy tube was placed   Filed Vitals:   08/25/15 1230 08/25/15 1258 08/25/15 2046 08/26/15 0425  BP: 119/76 117/69 113/59 114/67  Pulse: 78 79 79 81  Temp:  97.4 F (36.3 C) 97.8 F (36.6 C) 97.8 F (36.6 C)  TempSrc:  Oral Oral Oral  Resp:  18 18 18   Height:      Weight:    90.447 kg (199 lb 6.4 oz)  SpO2: 96% 96% 96% 94%    Intake/Output Summary (Last 24 hours) at 08/26/15 0910 Last data filed at 08/26/15 0700  Gross per 24 hour  Intake    790 ml  Output   2025 ml  Net  -1235 ml    LABS: Basic Metabolic Panel:  Recent Labs  08/24/15 1332 08/25/15 0519  NA 133* 127*  K 5.1 4.0  CL 96* 96*  CO2 22 20*  GLUCOSE 105* 117*  BUN 71* 76*  CREATININE 4.62* 4.99*  CALCIUM 8.6* 7.9*   Liver Function Tests: No results for input(s): AST, ALT, ALKPHOS, BILITOT, PROT, ALBUMIN in the last 72 hours. No results for input(s): LIPASE, AMYLASE in the last 72 hours. CBC:  Recent Labs  08/24/15 0520 08/25/15 0519  WBC 20.6* 15.3*  HGB 12.4* 11.4*  HCT 40.1 36.9*  MCV 75.2* 74.0*  PLT 196 184   Cardiac Enzymes: No results for input(s): CKTOTAL, CKMB, CKMBINDEX, TROPONINI in the last 72 hours. BNP: Invalid input(s): POCBNP D-Dimer: No results for input(s): DDIMER in the last 72 hours. Hemoglobin A1C: No results for input(s): HGBA1C in the last 72 hours. Fasting Lipid Panel: No results for input(s): CHOL, HDL, LDLCALC, TRIG, CHOLHDL, LDLDIRECT in the last 72 hours. Thyroid Function Tests: No results for input(s): TSH, T4TOTAL, T3FREE, THYROIDAB in the last 72 hours.  Invalid input(s): FREET3 Anemia Panel: No results for input(s): VITAMINB12, FOLATE, FERRITIN, TIBC, IRON, RETICCTPCT in the last 72 hours.   Physical Exam: Blood pressure 114/67, pulse 81, temperature 97.8 F (36.6 C), temperature source Oral, resp. rate 18, height 6\' 2"  (1.88 m), weight  90.447 kg (199 lb 6.4 oz), SpO2 94 %.    General appearance: alert and cooperative Resp: rhonchi RLL and RML Cardio: regular rate and rhythm GI: soft, non-tender; bowel sounds normal; no masses,  no organomegaly Extremities: extremities normal, atraumatic, no cyanosis or edema Neurologic: Grossly normal  TELEMETRY: Reviewed telemetry pt in  Sinus rhythm with occasional sinus tachycardia  ASSESSMENT AND PLAN:  Active Problems:   Acute congestive heart failure ([place]) the-currently stable.  Continue to closely follow on current medications.  Continue with careful diuresis.   UTI (lower urinary tract infection)   Hydronephrosis with urinary obstruction due to renal calculus   ARF (acute renal failure) (H[facility])   Hydronephrosis    Right pleural effusion -continue with intermittent drainage of the PleurX catheter.  Patient has wife advised to limit drainage to hopefully reduce the amount of fluid.  Appreciate Dr. Faith Rogue input.  Plan to consider pleurodesis if the effusion process  Teodoro Spray., MD, [facility] 08/26/2015 9:10 AM

## 2015-08-27 ENCOUNTER — Ambulatory Visit: Payer: Medicare Other

## 2015-08-27 LAB — VANCOMYCIN, RANDOM: Vancomycin Rm: 7 ug/mL

## 2015-08-27 LAB — EXPECTORATED SPUTUM ASSESSMENT W REFEX TO RESP CULTURE

## 2015-08-27 LAB — CULTURE, BLOOD (ROUTINE X 2)
Culture: NO GROWTH
Culture: NO GROWTH

## 2015-08-27 LAB — EXPECTORATED SPUTUM ASSESSMENT W GRAM STAIN, RFLX TO RESP C

## 2015-08-27 LAB — CREATININE, SERUM
CREATININE: 4.55 mg/dL — AB (ref 0.61–1.24)
GFR calc Af Amer: 14 mL/min — ABNORMAL LOW (ref >60)
GFR, EST NON AFRICAN AMERICAN: 12 mL/min — AB (ref >60)

## 2015-08-27 LAB — URINE CULTURE

## 2015-08-27 MED ORDER — VANCOMYCIN HCL IN DEXTROSE 1-5 GM/200ML-% IV SOLN
1000.0000 mg | Freq: Two times a day (BID) | INTRAVENOUS | Status: DC
  Administered 2015-08-27 – 2015-08-28 (×2): 1000 mg via INTRAVENOUS
  Filled 2015-08-27 (×3): qty 200

## 2015-08-27 NOTE — Progress Notes (Addendum)
Patient ID: Collin [name], male   DOB: 1941-07-29, 74 y.o.   MRN: [id number] [facility] PROGRESS NOTE  PCP: [name], MD  HPI/Subjective: Patient does not feel well at all. Still coughing up greenish phlegm and wheezing. He is still short of breath.  Objective: Filed Vitals:   08/27/15 0527 08/27/15 1226  BP: 118/75 119/65  Pulse: 77 77  Temp: 97.5 F (36.4 C) 97.8 F (36.6 C)  Resp: 18 21    Filed Weights   08/25/15 0433 08/26/15 0425 08/27/15 0647  Weight: 89.676 kg (197 lb 11.2 oz) 90.447 kg (199 lb 6.4 oz) 90.039 kg (198 lb 8 oz)    ROS: Review of Systems  Constitutional: Negative for fever and chills.  Eyes: Negative for blurred vision.  Respiratory: Positive for cough, sputum production and shortness of breath.   Cardiovascular: Positive for chest pain.  Gastrointestinal: Negative for nausea, vomiting, abdominal pain, diarrhea and constipation.  Genitourinary: Negative for dysuria.  Musculoskeletal: Negative for joint pain.  Neurological: Negative for dizziness and headaches.   Exam: Physical Exam  Constitutional: He is oriented to person, place, and time.  HENT:  Nose: No mucosal edema.  Mouth/Throat: No oropharyngeal exudate or posterior oropharyngeal edema.  Eyes: Conjunctivae, EOM and lids are normal. Pupils are equal, round, and reactive to light.  Neck: No JVD present. Carotid bruit is not present. No edema present. No thyroid mass and no thyromegaly present.  Cardiovascular: S1 normal and S2 normal.  Exam reveals no gallop.   No murmur heard. Pulses:      Dorsalis pedis pulses are 2+ on the right side, and 2+ on the left side.  Respiratory: No respiratory distress. He has wheezes in the right middle field and the left lower field. He has no rhonchi. He has rales in the right lower field.  GI: Soft. Bowel sounds are normal. There is no tenderness.  Musculoskeletal:       Right ankle: He exhibits swelling.       Left ankle: He  exhibits swelling.  Lymphadenopathy:    He has no cervical adenopathy.  Neurological: He is alert and oriented to person, place, and time. No cranial nerve deficit.  Skin: Skin is warm. No rash noted. Nails show no clubbing.  Psychiatric: He has a normal mood and affect.    Data Reviewed: Basic Metabolic Panel:  Recent Labs Lab 08/23/15 1842 08/24/15 0520 08/24/15 1332 08/25/15 0519 08/26/15 0840 08/27/15 0515  NA 128* 131* 133* 127* 131*  --   K 5.9* 6.2* 5.1 4.0 4.4  --   CL 101 100* 96* 96* 97*  --   CO2 18* 20* 22 20* 20*  --   GLUCOSE 145* 85 105* 117* 205*  --   BUN 56* 66* 71* 76* 83*  --   CREATININE 3.79* 4.28* 4.62* 4.99* 5.14* 4.55*  CALCIUM 8.4* 8.6* 8.6* 7.9* 8.4*  --    CBC:  Recent Labs Lab 08/22/15 1322 08/23/15 0602 08/24/15 0520 08/25/15 0519 08/26/15 0840  WBC 14.7* 24.4* 20.6* 15.3* 13.5*  HGB 14.1 13.0 12.4* 11.4* 11.2*  HCT 46.2 42.3 40.1 36.9* 36.3*  MCV 75.3* 75.0* 75.2* 74.0* 74.0*  PLT 253 215 196 184 171   Cardiac Enzymes:  Recent Labs Lab 08/22/15 1322 08/22/15 1950 08/23/15 0120 08/23/15 0602  TROPONINI 0.13* 0.04* 0.04* 0.04*   BNP (last 3 results)  Recent Labs  03/19/15 1729 06/12/15 1147 08/22/15 1322  BNP >4500.0* >4500.0* >4500.0*    Recent Results (  from the past 240 hour(s))  Blood culture (routine x 2)     Status: None   Collection Time: 08/22/15  4:39 PM  Result Value Ref Range Status   Specimen Description BLOOD RIGHT FOREARM  Final   Special Requests   Final    BOTTLES DRAWN AEROBIC AND ANAEROBIC  AER 6CC ANA 2CC   Culture NO GROWTH 5 DAYS  Final   Report Status 08/27/2015 FINAL  Final  Blood culture (routine x 2)     Status: None   Collection Time: 08/22/15  4:40 PM  Result Value Ref Range Status   Specimen Description BLOOD LEFT ARM  Final   Special Requests BOTTLES DRAWN AEROBIC AND ANAEROBIC  1CC  Final   Culture NO GROWTH 5 DAYS  Final   Report Status 08/27/2015 FINAL  Final  Urine culture      Status: None   Collection Time: 08/23/15  3:55 PM  Result Value Ref Range Status   Specimen Description URINE, RANDOM  Final   Special Requests NONE  Final   Culture 80,000 COLONIES/ml CANDIDA ALBICANS  Final   Report Status 08/27/2015 FINAL  Final  Body fluid culture     Status: None (Preliminary result)   Collection Time: 08/25/15 11:00 AM  Result Value Ref Range Status   Specimen Description FLUID  Final   Special Requests NONE  Final   Gram Stain TOO NUMEROUS TO COUNT WBC SEEN FEW YEAST   Final   Culture HEAVY GROWTH YEAST IDENTIFICATION TO FOLLOW   Final   Report Status PENDING  Incomplete     Studies: Dg Chest 1 View  08/26/2015  CLINICAL DATA:  Cough EXAM: CHEST 1 VIEW COMPARISON:  08/22/2015 chest radiograph. FINDINGS: Stable cardiomediastinal silhouette with mild cardiomegaly. No pneumothorax. Stable small right pleural effusion. New small left pleural effusion. Stable mild pulmonary edema. Left lower lobe opacity, not appreciably changed. IMPRESSION: 1. Stable mild cardiomegaly with mild pulmonary edema, suggesting mild congestive heart failure. 2. Stable small right pleural effusion and new small left pleural effusion. 3. Stable left lower lobe lung opacity, favor atelectasis. Electronically Signed   By: Ilona Sorrel M.D.   On: 08/26/2015 14:32    Scheduled Meds: . azithromycin  500 mg Intravenous Q24H  . budesonide (PULMICORT) nebulizer solution  0.25 mg Nebulization BID  . carvedilol  12.5 mg Oral BID WC  . cefTRIAXone (ROCEPHIN)  IV  1 g Intravenous Q24H  . docusate sodium  100 mg Oral BID  . fluconazole (DIFLUCAN) IV  200 mg Intravenous Q24H  . heparin subcutaneous  5,000 Units Subcutaneous Q12H  . pantoprazole  40 mg Oral Daily  . polyethylene glycol  17 g Oral Daily  . predniSONE  20 mg Oral Q breakfast  . sodium chloride  3 mL Intravenous Q12H  . tiotropium  18 mcg Inhalation Daily   Continuous Infusions: . sodium chloride 10 mL/hr at 08/25/15 1033     Assessment/Plan:  1. COPD exacerbation. Patient moving better air today. Continue budesonide nebulizers and patient is already on prednisone. Patient is on Rocephin and Zithromax and I added vancomycin yesterday. 2. 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 Lasix is on hold secondary to renal failure. Patient's wife drains the Pleurx chest tube. 3. Acute on chronic renal failure. Foley catheter placed for urinary retention. Urology recommended interventional radiology right nephrostomy tube, which was done today.. Urology once to follow-up as outpatient. Creatinine peaked yesterday and is starting to trend down and  today's creatinine is 4.55.  4. Pyelonephritis with yeast- on Diflucan 5. Elevated troponin demand ischemia 6. Hyperkalemia improved 7. Hyponatremia- continue to monitor 8.  weakness will get physical therapy evaluation  Code Status:     Code Status Orders        Start     Ordered   08/22/15 1745  Full code   Continuous     08/22/15 1744    Advance Directive Documentation        Most Recent Value   Type of Advance Directive  Living will   Pre-existing out of facility DNR order (yellow form or pink MOST form)     "MOST" Form in Place?       Family Communication: Wife at the bedside Disposition Plan: To be determined  Consultants:  Cardiothoracic surgery  Cardiology  Nephrology  Urology  Procedures:  Right nephrostomy tube by interventional radiology  Foley catheter placement  Antibiotics:  Rocephin  Zithromax  Diflucan  Vancomycin  Time spent: 20 minutes  Loletha Grayer  [facility]

## 2015-08-27 NOTE — Progress Notes (Signed)
ANTIBIOTIC CONSULT NOTE - INITIAL  Pharmacy Consult for vancomycin Indication: pneumonia  Allergies  Allergen Reactions  . Lisinopril Swelling    Pt reports throat swelling.  . Metoprolol     Other reaction(s): Other (See Comments) Caused ringing in ears    Patient Measurements: Height: 6\' 2"  (188 cm) Weight: 198 lb 8 oz (90.039 kg) IBW/kg (Calculated) : 82.2 Adjusted Body Weight: n/a  Vital Signs: Temp: 97.8 F (36.6 C) (12/16 1226) Temp Source: Axillary (12/16 1226) BP: 119/65 mmHg (12/16 1226) Pulse Rate: 77 (12/16 1226)  Labs:  Recent Labs  08/25/15 0519 08/26/15 0840 08/27/15 0515  WBC 15.3* 13.5*  --   HGB 11.4* 11.2*  --   PLT 184 171  --   CREATININE 4.99* 5.14* 4.55*   Estimated Creatinine Clearance: 17.1 mL/min (by C-G formula based on Cr of 4.55).  Recent Labs  08/27/15 1406  VANCORANDOM 7     Assessment: Pharmacy consulted to dose vancomycin in this 74 year old male for pneumonia. Patient is currently being treated for both PNA and UTI and had a nephrostomy tube placed yesterday. Per MD, patient is coughing up green sputum/material today and is adding vancomycin.  Current anti-infectives are as follows: Day #5 azithromycin 500 IV daily (start: 12/11) for PNA Day #5 ceftriaxone 1 g IV daily (start: 12/11) for PNA/UTI Day #2 fluconazole 200 mg IV daily (start: 12/14) for yeast in urine after invasive procedure Day #1 vancomycin (start: 12/15) for PNA  Patient has poor renal function, with a CrCl of ~ 15 mL/min and is not on HD  Goal of Therapy:  Vancomycin trough level 15-20 mcg/ml  Plan:  Give vancomycin 1000 mg x1 dose (~11 mg/kg) Given renal function, will dose off of random vancomycin levels Ordered a 24 hour vancomycin level for tomorrow at 1330  12/16:  Vanc level @ 14:00 = 7 mcg/mL Will order Vancomycin 1 gm IV Q12H top start 12/16 @ 20:00 and draw 1st trough before 4th dose on 12/18 @ 7:30.   Pharmacy will continue to monitor,  thank you for the consult.  Lynda Wanninger D 08/27/2015,7:10 PM

## 2015-08-27 NOTE — Progress Notes (Signed)
Urology Consult Follow Up  Subjective: Creatinine starting to trend downwards today. Continues to have shortness of breath, coughing, and lower extremity edema. Overall, feels very poorly.  Anti-infectives: Anti-infectives    Start     Dose/Rate Route Frequency Ordered Stop   08/26/15 1400  vancomycin (VANCOCIN) IVPB 1000 mg/200 mL premix     1,000 mg 200 mL/hr over 60 Minutes Intravenous  Once 08/26/15 1316 08/26/15 1517   08/25/15 1600  fluconazole (DIFLUCAN) IVPB 200 mg     200 mg 100 mL/hr over 60 Minutes Intravenous Every 24 hours 08/25/15 1519     08/25/15 1045  ciprofloxacin (CIPRO) IVPB 400 mg  Status:  Discontinued     400 mg 200 mL/hr over 60 Minutes Intravenous Every 12 hours 08/25/15 1034 08/25/15 1043   08/25/15 1045  ciprofloxacin (CIPRO) IVPB 400 mg     400 mg 200 mL/hr over 60 Minutes Intravenous Once 08/25/15 1043 08/25/15 1145   08/25/15 1040  ciprofloxacin (CIPRO) 400 MG/200ML IVPB    Comments:  Darlin Drop: cabinet override      08/25/15 1040 08/25/15 1045   08/23/15 1800  cefTRIAXone (ROCEPHIN) 1 g in dextrose 5 % 50 mL IVPB     1 g 100 mL/hr over 30 Minutes Intravenous Every 24 hours 08/22/15 1752     08/22/15 1630  cefTRIAXone (ROCEPHIN) 1 g in dextrose 5 % 50 mL IVPB  Status:  Discontinued     1 g 100 mL/hr over 30 Minutes Intravenous Every 24 hours 08/22/15 1624 08/22/15 1752   08/22/15 1630  azithromycin (ZITHROMAX) 500 mg in dextrose 5 % 250 mL IVPB     500 mg 250 mL/hr over 60 Minutes Intravenous Every 24 hours 08/22/15 1624     08/22/15 1600  cefTRIAXone (ROCEPHIN) 1 g in dextrose 5 % 50 mL IVPB     1 g 100 mL/hr over 30 Minutes Intravenous  Once 08/22/15 1553 08/22/15 1802      Current Facility-Administered Medications  Medication Dose Route Frequency Provider Last Rate Last Dose  . 0.9 %  sodium chloride infusion   Intravenous Continuous Markus Daft, MD 10 mL/hr at 08/25/15 1033    . acetaminophen (TYLENOL) tablet 650 mg  650 mg Oral Q6H PRN  Theodoro Grist, MD       Or  . acetaminophen (TYLENOL) suppository 650 mg  650 mg Rectal Q6H PRN Theodoro Grist, MD      . albuterol (PROVENTIL) (2.5 MG/3ML) 0.083% nebulizer solution 2.5 mg  2.5 mg Nebulization Q6H PRN Theodoro Grist, MD      . ALPRAZolam Duanne Moron) tablet 0.25 mg  0.25 mg Oral QHS PRN Theodoro Grist, MD   0.25 mg at 08/23/15 2256  . azithromycin (ZITHROMAX) 500 mg in dextrose 5 % 250 mL IVPB  500 mg Intravenous Q24H Theodoro Grist, MD   500 mg at 08/27/15 1823  . budesonide (PULMICORT) nebulizer solution 0.25 mg  0.25 mg Nebulization BID Loletha Grayer, MD   0.25 mg at 08/27/15 0731  . carvedilol (COREG) tablet 12.5 mg  12.5 mg Oral BID WC Demetrios Loll, MD   12.5 mg at 08/27/15 1825  . cefTRIAXone (ROCEPHIN) 1 g in dextrose 5 % 50 mL IVPB  1 g Intravenous Q24H Theodoro Grist, MD   1 g at 08/26/15 1737  . docusate sodium (COLACE) capsule 100 mg  100 mg Oral BID Theodoro Grist, MD   100 mg at 08/27/15 0952  . fluconazole (DIFLUCAN) IVPB 200 mg  200 mg Intravenous  Q24H Lenis Noon, RPH   200 mg at 08/27/15 1534  . guaiFENesin-dextromethorphan (ROBITUSSIN DM) 100-10 MG/5ML syrup 5 mL  5 mL Oral Q8H PRN Loletha Grayer, MD   5 mL at 08/26/15 1417  . heparin injection 5,000 Units  5,000 Units Subcutaneous Q12H Loletha Grayer, MD   5,000 Units at 08/27/15 [phone]  . HYDROcodone-acetaminophen (NORCO/VICODIN) 5-325 MG per tablet 1-2 tablet  1-2 tablet Oral Q4H PRN Theodoro Grist, MD   1 tablet at 08/23/15 0514  . iohexol (OMNIPAQUE) 300 MG/ML solution 50 mL  50 mL Other Once PRN Markus Daft, MD      . lactulose (CHRONULAC) 10 GM/15ML solution 20 g  20 g Oral BID PRN Demetrios Loll, MD      . lidocaine (PF) (XYLOCAINE) 1 % injection    PRN Markus Daft, MD   10 mL at 08/25/15 1127  . nicotine polacrilex (NICORETTE) gum 2 mg  2 mg Oral PRN Theodoro Grist, MD      . nitroGLYCERIN (NITROSTAT) SL tablet 0.4 mg  0.4 mg Sublingual Q5 min PRN Theodoro Grist, MD      . ondansetron (ZOFRAN) tablet 4 mg  4 mg Oral Q6H PRN  Theodoro Grist, MD       Or  . ondansetron (ZOFRAN) injection 4 mg  4 mg Intravenous Q6H PRN Theodoro Grist, MD   4 mg at 08/23/15 1422  . oxyCODONE-acetaminophen (PERCOCET/ROXICET) 5-325 MG per tablet 1-2 tablet  1-2 tablet Oral Q4H PRN Theodoro Grist, MD   2 tablet at 08/27/15 1534  . pantoprazole (PROTONIX) EC tablet 40 mg  40 mg Oral Daily Theodoro Grist, MD   40 mg at 08/27/15 0952  . polyethylene glycol (MIRALAX / GLYCOLAX) packet 17 g  17 g Oral Daily Loletha Grayer, MD   17 g at 08/27/15 [phone]  . predniSONE (DELTASONE) tablet 20 mg  20 mg Oral Q breakfast Demetrios Loll, MD   20 mg at 08/27/15 [id number]  . sodium chloride 0.9 % injection 3 mL  3 mL Intravenous Q12H Theodoro Grist, MD   3 mL at 08/27/15 0952  . tiotropium (SPIRIVA) inhalation capsule 18 mcg  18 mcg Inhalation Daily Theodoro Grist, MD   18 mcg at 08/27/15 0951     Objective: Vital signs in last 24 hours: Temp:  [97.5 F (36.4 C)-97.9 F (36.6 C)] 97.8 F (36.6 C) (12/16 1226) Pulse Rate:  [73-77] 77 (12/16 1226) Resp:  [18-21] 21 (12/16 1226) BP: (113-119)/(64-75) 119/65 mmHg (12/16 1226) SpO2:  [94 %-98 %] 98 % (12/16 1226) Weight:  [198 lb 8 oz (90.039 kg)] 198 lb 8 oz (90.039 kg) (12/16 0647)  Intake/Output from previous day: 12/15 0701 - 12/16 0700 In: 2190 [P.O.:840; IV Piggyback:1000] Out: 1850 [Urine:850; Drains:1000] Intake/Output this shift: Total I/O In: 240 [P.O.:240] Out: 1400 [Urine:1400]   Physical Exam Constitutional: Well nourished. Alert and oriented, No acute distress. HEENT: [place] AT, moist mucus membranes. Trachea midline, no masses. Cardiovascular: No clubbing, cyanosis.  Bilateral lower extremity edema noted. Respiratory: + Increased WOB, wearing 02.   GI: Abdomen is soft, non tender, non distended, no abdominal masses, acities appreciated.   GU: Right CVA tenderness with nephrostomy tube in place draining cloudy yellow urine.  Site is clean and dry and intact. Foley catheter draining less cloudy  yellow urine.  Skin: + arm bruising Neurologic: Grossly intact, no focal deficits, moving all 4 extremities. Psychiatric: Normal mood and affect.  Lab Results:   Recent Labs  08/25/15 0519 08/26/15  0840  WBC 15.3* 13.5*  HGB 11.4* 11.2*  HCT 36.9* 36.3*  PLT 184 171   BMET  Recent Labs  08/25/15 0519 08/26/15 0840 08/27/15 0515  NA 127* 131*  --   K 4.0 4.4  --   CL 96* 97*  --   CO2 20* 20*  --   GLUCOSE 117* 205*  --   BUN 76* 83*  --   CREATININE 4.99* 5.14* 4.55*  CALCIUM 7.9* 8.4*  --    Dg Chest 1 View  08/26/2015  CLINICAL DATA:  Cough EXAM: CHEST 1 VIEW COMPARISON:  08/22/2015 chest radiograph. FINDINGS: Stable cardiomediastinal silhouette with mild cardiomegaly. No pneumothorax. Stable small right pleural effusion. New small left pleural effusion. Stable mild pulmonary edema. Left lower lobe opacity, not appreciably changed. IMPRESSION: 1. Stable mild cardiomegaly with mild pulmonary edema, suggesting mild congestive heart failure. 2. Stable small right pleural effusion and new small left pleural effusion. 3. Stable left lower lobe lung opacity, favor atelectasis. Electronically Signed   By: Ilona Sorrel M.D.   On: 08/26/2015 14:32      Assessment: 74 year old male with right retained ureteral stent, acute on chronic renal failure status post right percutaneous nephrostomy tube 2 days ago. Creatinine finally trending down today.  1.  Acute on chronic renal failure status post right nephrostomy tube placement 2. Leukocytosis, improving, likely secondary to infection versus steroids 3. Possible right pyelonephritis, on antibiotics and Diflucan, cultures growing yeast (Candida albicans) 4. Bilateral nephrolithiasis with right encrusted, retained ureteral stent-will ultimately need right ureteroscopy to remove right-sided stent and treat right-sided obstructing stones- currently with COPD, CHF exacerbation, not ideal for general anesthesia at this time, awaiting  optimization.  Plan: - Continue to trend BMP - If creatinine continues to trend downwards tomorrow, recommend removal of penile Foley catheter - No additional surgical intervention for obstructing stones at this time, retained stent at this time given pyelonephrosis/ comorbid conditions, - Continue antibiotics/ diflucan  Plan to see patient again on Monday to reassess. Please contact me over the weekend with any questions or concerns.   LOS: 5 days    Hollice Espy 08/27/2015

## 2015-08-27 NOTE — Progress Notes (Signed)
[facility]  ROUNDING NOTE   Subjective:  Renal function has started to improve. Creatinine currently down to 4.5 with a urine output of 1.0. Urine less cloudy from nephrostomy.   Objective:  Vital signs in last 24 hours:  Temp:  [97.5 F (36.4 C)-97.9 F (36.6 C)] 97.8 F (36.6 C) (12/16 1226) Pulse Rate:  [73-77] 77 (12/16 1226) Resp:  [18-21] 21 (12/16 1226) BP: (113-119)/(64-75) 119/65 mmHg (12/16 1226) SpO2:  [94 %-98 %] 98 % (12/16 1226) Weight:  [90.039 kg (198 lb 8 oz)] 90.039 kg (198 lb 8 oz) (12/16 0647)  Weight change: -0.408 kg (-14.4 oz) Filed Weights   08/25/15 0433 08/26/15 0425 08/27/15 0647  Weight: 89.676 kg (197 lb 11.2 oz) 90.447 kg (199 lb 6.4 oz) 90.039 kg (198 lb 8 oz)    Intake/Output: I/O last 3 completed shifts: In: 2490 [P.O.:840; Other:650; IV Piggyback:1000] Out: 2175 [Urine:1175; Drains:1000]   Intake/Output this shift:  Total I/O In: 240 [P.O.:240] Out: 1050 [Urine:1050]  Physical Exam: General: NAD, resting in bed  Head: Normocephalic, atraumatic. Moist oral mucosal membranes  Eyes: Anicteric  Neck: Supple, trachea midline  Lungs:  Basilar rales, normal effort, right pleurex in place  Heart: S1S2 no rubs  Abdomen:  Soft, nontender, BS present, right nephrostomy in place  Extremities: trace peripheral edema.  Neurologic: Nonfocal, moving all four extremities  Skin: No lesions       Basic Metabolic Panel:  Recent Labs Lab 08/23/15 1842 08/24/15 0520 08/24/15 1332 08/25/15 0519 08/26/15 0840 08/27/15 0515  NA 128* 131* 133* 127* 131*  --   K 5.9* 6.2* 5.1 4.0 4.4  --   CL 101 100* 96* 96* 97*  --   CO2 18* 20* 22 20* 20*  --   GLUCOSE 145* 85 105* 117* 205*  --   BUN 56* 66* 71* 76* 83*  --   CREATININE 3.79* 4.28* 4.62* 4.99* 5.14* 4.55*  CALCIUM 8.4* 8.6* 8.6* 7.9* 8.4*  --     Liver Function Tests: No results for input(s): AST, ALT, ALKPHOS, BILITOT, PROT, ALBUMIN in the last 168 hours. No results  for input(s): LIPASE, AMYLASE in the last 168 hours. No results for input(s): AMMONIA in the last 168 hours.  CBC:  Recent Labs Lab 08/22/15 1322 08/23/15 0602 08/24/15 0520 08/25/15 0519 08/26/15 0840  WBC 14.7* 24.4* 20.6* 15.3* 13.5*  HGB 14.1 13.0 12.4* 11.4* 11.2*  HCT 46.2 42.3 40.1 36.9* 36.3*  MCV 75.3* 75.0* 75.2* 74.0* 74.0*  PLT 253 215 196 184 171    Cardiac Enzymes:  Recent Labs Lab 08/22/15 1322 08/22/15 1950 08/23/15 0120 08/23/15 0602  TROPONINI 0.13* 0.04* 0.04* 0.04*    BNP: Invalid input(s): POCBNP  CBG: No results for input(s): GLUCAP in the last 168 hours.  Microbiology: Results for orders placed or performed during the hospital encounter of 08/22/15  Blood culture (routine x 2)     Status: None   Collection Time: 08/22/15  4:39 PM  Result Value Ref Range Status   Specimen Description BLOOD RIGHT FOREARM  Final   Special Requests   Final    BOTTLES DRAWN AEROBIC AND ANAEROBIC  AER [place] ANA 2CC   Culture NO GROWTH 5 DAYS  Final   Report Status 08/27/2015 FINAL  Final  Blood culture (routine x 2)     Status: None   Collection Time: 08/22/15  4:40 PM  Result Value Ref Range Status   Specimen Description BLOOD LEFT ARM  Final  Special Requests BOTTLES DRAWN AEROBIC AND ANAEROBIC  1CC  Final   Culture NO GROWTH 5 DAYS  Final   Report Status 08/27/2015 FINAL  Final  Urine culture     Status: None   Collection Time: 08/23/15  3:55 PM  Result Value Ref Range Status   Specimen Description URINE, RANDOM  Final   Special Requests NONE  Final   Culture 80,000 COLONIES/ml CANDIDA ALBICANS  Final   Report Status 08/27/2015 FINAL  Final  Body fluid culture     Status: None (Preliminary result)   Collection Time: 08/25/15 11:00 AM  Result Value Ref Range Status   Specimen Description FLUID  Final   Special Requests NONE  Final   Gram Stain TOO NUMEROUS TO COUNT WBC SEEN FEW YEAST   Final   Culture HEAVY GROWTH YEAST IDENTIFICATION TO FOLLOW    Final   Report Status PENDING  Incomplete    Coagulation Studies: No results for input(s): LABPROT, INR in the last 72 hours.  Urinalysis: No results for input(s): COLORURINE, LABSPEC, PHURINE, GLUCOSEU, HGBUR, BILIRUBINUR, KETONESUR, PROTEINUR, UROBILINOGEN, NITRITE, LEUKOCYTESUR in the last 72 hours.  Invalid input(s): APPERANCEUR    Imaging: Dg Chest 1 View  08/26/2015  CLINICAL DATA:  Cough EXAM: CHEST 1 VIEW COMPARISON:  08/22/2015 chest radiograph. FINDINGS: Stable cardiomediastinal silhouette with mild cardiomegaly. No pneumothorax. Stable small right pleural effusion. New small left pleural effusion. Stable mild pulmonary edema. Left lower lobe opacity, not appreciably changed. IMPRESSION: 1. Stable mild cardiomegaly with mild pulmonary edema, suggesting mild congestive heart failure. 2. Stable small right pleural effusion and new small left pleural effusion. 3. Stable left lower lobe lung opacity, favor atelectasis. Electronically Signed   By: Ilona Sorrel M.D.   On: 08/26/2015 14:32     Medications:   . sodium chloride 10 mL/hr at 08/25/15 1033   . azithromycin  500 mg Intravenous Q24H  . budesonide (PULMICORT) nebulizer solution  0.25 mg Nebulization BID  . carvedilol  12.5 mg Oral BID WC  . cefTRIAXone (ROCEPHIN)  IV  1 g Intravenous Q24H  . docusate sodium  100 mg Oral BID  . fluconazole (DIFLUCAN) IV  200 mg Intravenous Q24H  . heparin subcutaneous  5,000 Units Subcutaneous Q12H  . pantoprazole  40 mg Oral Daily  . polyethylene glycol  17 g Oral Daily  . predniSONE  20 mg Oral Q breakfast  . sodium chloride  3 mL Intravenous Q12H  . tiotropium  18 mcg Inhalation Daily   acetaminophen **OR** acetaminophen, albuterol, ALPRAZolam, guaiFENesin-dextromethorphan, HYDROcodone-acetaminophen, iohexol, lactulose, lidocaine (PF), nicotine polacrilex, nitroGLYCERIN, ondansetron **OR** ondansetron (ZOFRAN) IV, oxyCODONE-acetaminophen  Assessment/ Plan:  74 y.o. male with  hypertension, nephrolithiasis, COPD, GERD, anxiety, AAA repair, recurrent right pleural effusions  1.  Acute renal failure/CKD stage III baseline EGFR 48/Proteinuria/hydronephrosis.   -  Hydronephrosis could certainly be resulting in some acute renal failure now though we suspect that the hydronephrosis has been quite chronic in nature. Percutaneous nephrostomy placed 08/25/15. -  Renal function has started to improve, no urgent indication for HD at the moment, will continue to monitor outpt from the nephrostomy and follow Cr and UOP trend.   2.  Hypertension:  Blood pressure 119/65, continue Coreg 12.5 mg by mouth twice a day.  3. Hyperkalemia. K currently 4.4, improved significantly.      LOS: 5 Collin [name] 12/16/20163:12 PM

## 2015-08-28 LAB — VANCOMYCIN, TROUGH: VANCOMYCIN TR: 24 ug/mL — AB (ref 10–20)

## 2015-08-28 LAB — BODY FLUID CULTURE

## 2015-08-28 LAB — RENAL FUNCTION PANEL
ALBUMIN: 2.7 g/dL — AB (ref 3.5–5.0)
Anion gap: 10 (ref 5–15)
BUN: 85 mg/dL — AB (ref 6–20)
CALCIUM: 8.4 mg/dL — AB (ref 8.9–10.3)
CO2: 23 mmol/L (ref 22–32)
CREATININE: 3.73 mg/dL — AB (ref 0.61–1.24)
Chloride: 96 mmol/L — ABNORMAL LOW (ref 101–111)
GFR, EST AFRICAN AMERICAN: 17 mL/min — AB (ref >60)
GFR, EST NON AFRICAN AMERICAN: 15 mL/min — AB (ref >60)
Glucose, Bld: 128 mg/dL — ABNORMAL HIGH (ref 65–99)
PHOSPHORUS: 6 mg/dL — AB (ref 2.5–4.6)
Potassium: 4.9 mmol/L (ref 3.5–5.1)
SODIUM: 129 mmol/L — AB (ref 135–145)

## 2015-08-28 LAB — CBC
HEMATOCRIT: 39.4 % — AB (ref 40.0–52.0)
HEMOGLOBIN: 11.9 g/dL — AB (ref 13.0–18.0)
MCH: 22.5 pg — AB (ref 26.0–34.0)
MCHC: 30.3 g/dL — AB (ref 32.0–36.0)
MCV: 74.1 fL — AB (ref 80.0–100.0)
Platelets: 175 10*3/uL (ref 150–440)
RBC: 5.31 MIL/uL (ref 4.40–5.90)
RDW: 19.9 % — ABNORMAL HIGH (ref 11.5–14.5)
WBC: 13.2 10*3/uL — ABNORMAL HIGH (ref 3.8–10.6)

## 2015-08-28 MED ORDER — SODIUM CHLORIDE 0.9 % IV SOLN: 1250.0000 mg | INTRAVENOUS | Status: DC

## 2015-08-28 MED ORDER — VANCOMYCIN HCL 10 G IV SOLR
1250.0000 mg | INTRAVENOUS | Status: DC
  Administered 2015-08-29: 1250 mg via INTRAVENOUS
  Filled 2015-08-28: qty 1250

## 2015-08-28 MED ORDER — VANCOMYCIN HCL 10 G IV SOLR: 1250.0000 mg | INTRAVENOUS | Status: DC

## 2015-08-28 NOTE — Evaluation (Signed)
Physical Therapy Evaluation Patient Details Name: Collin [name] MRN: [id number] DOB: 04/17/1941 Today's Date: 08/28/2015   History of Present Illness  Pt is a 74yo white male with a complex medical history including COPD (on 2L since Oct '16), CHF (thorax drainage tube since Oct '16), HTN, HLD, lumbar spinal stenosis since 2004, with an EF of 25%. Pt reports to [name] on 08/22/15 after 1 days of worsening SOB, nausea, vomitting, and productive cough with green phlegm, as well as R sided flank pain, unable to pass urine. Pt presents today with nephrostomy in place on R side as well. Pt was admitting and is being treated for Acute on chronic congestive heart failure, with UTI, and hydronephrosis.   Clinical Impression  Pt is received sitting at EOB, awake and alert with wife at bedside. Pt is reluctant to perform ambulation, but willing to participate within tolerated amounts for the purposes of full evaluation. Pt is A&Ox3 and pleasant. Pt reports zero falls in the last 6 months, but frequent stumble that he is able to self correct, to which he relates to chronic history of spinal stenosis. Functional mobility assessment reveals mild to moderate strength deficits and ability to perform all activity with supervision due to multiple lines and leads. Balance screening does not identify any significantly elevated falls risk, and patient does not consent to additional falls screening due to current pain. Pt remaining on 2L O2 throughout evaluation, but desaturating with activity (91%), which they say is typical at baseline. Pt is on O2 at baseline at home. Patient presenting with impairment of strength, range of motion, balance, oxygen perfusion, and activity tolerance, limiting ability to perform IADL, community ambulation, and mobility baseline level of function. Pt is safe and appropriate for DC to home with intermittent supervision, but will benefit from Skilled PT services s/p DC to address impairment of  higher functional levels. Pt is being DC from PT services in this setting and is encouraged and appropriate to continue to mobilize with nursing in order to prevent further decline. Patient will benefit from skilled intervention s/p DC to address the above impairments and limitations, in order to restore to prior level of function, and to improve functional capacity in community ambulation.       Follow Up Recommendations Home health PT    Equipment Recommendations  None recommended by PT    Recommendations for Other Services       Precautions / Restrictions Precautions Precautions: Fall Precaution Comments: nephrostomy R, pneumo drain R.  Restrictions Weight Bearing Restrictions: No      Mobility  Bed Mobility               General bed mobility comments: Received seated EOB.   Transfers Overall transfer level: Needs assistance Equipment used: Rolling walker (2 wheeled) Transfers: Sit to/from Stand Sit to Stand: Supervision         General transfer comment: supervision and set up for multiple lines, leads; unfamiliarity c RW for transfers results in need for verbal safety cues to stabilize RW.   Ambulation/Gait Ambulation/Gait assistance: Supervision Ambulation Distance (Feet): 60 Feet (declines further distances, as he feels very poorly and has 9/10 pain. ) Assistive device: Rolling walker (2 wheeled) Gait Pattern/deviations: Step-through pattern;Wide base of support;Trunk flexed     General Gait Details: Gilford Rile far out in front, possibly due to difficulty standing more erect as it pertains to spinal stenosis.   Stairs            Emergency planning/management officer  Modified Rankin (Stroke Patients Only)       Balance Overall balance assessment: No apparent balance deficits (not formally assessed) (A few stumbles at home, with self correction; pt performs rhomberg and forward reach without apparen tincreased falls risk. )                                            Pertinent Vitals/Pain Pain Assessment: 0-10 Pain Score: 9  Pain Location: Right flank pain Pain Intervention(s): Limited activity within patient's tolerance;Monitored during session;RN gave pain meds during session    [place] expects to be discharged to:: Private residence Living Arrangements: Spouse/significant other Available Help at Discharge: Family Type of Home: Mobile home Home Access: Stairs to enter Entrance Stairs-Rails: Can reach both;Left;Right Entrance Stairs-Number of Steps: 5 Home Layout: One level Home Equipment: Walker - 2 wheels;Cane - single point;Bedside commode;Shower seat - built in;Toilet riser      Prior Function Level of Independence: Independent with assistive device(s)         Comments: Community ambulator with SPC and Supplemental O2.      Hand Dominance   Dominant Hand: Right    Extremity/Trunk Assessment               Lower Extremity Assessment: Generalized weakness;Overall WFL for tasks assessed (generalized weakness with transfers mostly, but able to perform with moderate effort, modified independence. )      Cervical / Trunk Assessment: Kyphotic (kyphotic trunk c forward head posture, and cervical lordosis when cued to stand tall. )  Communication   Communication: No difficulties  Cognition Arousal/Alertness: Awake/alert Behavior During Therapy: WFL for tasks assessed/performed Overall Cognitive Status: Within Functional Limits for tasks assessed                      General Comments      Exercises        Assessment/Plan    PT Assessment All further PT needs can be met in the next venue of care  PT Diagnosis Generalized weakness;Acute pain   PT Problem List Decreased activity tolerance;Decreased mobility;Cardiopulmonary status limiting activity;Pain  PT Treatment Interventions     PT Goals (Current goals can be found in the Care Plan section) Acute Rehab PT  Goals Patient Stated Goal: Resolve Pain, and return to home.  PT Goal Formulation: With patient/family Time For Goal Achievement: 09/11/15 Potential to Achieve Goals: Good    Frequency     Barriers to discharge Decreased caregiver support      Co-evaluation               End of Session Equipment Utilized During Treatment: Gait belt Activity Tolerance: Patient tolerated treatment well;Patient limited by pain;Treatment limited secondary to agitation Patient left: in bed;with bed alarm set;with family/visitor present;with call bell/phone within reach Nurse Communication: Mobility status;Other (comment)         Time: 1301-1330 PT Time Calculation (min) (ACUTE ONLY): 29 min   Charges:   PT Evaluation $Initial PT Evaluation Tier I: 1 Procedure     PT G Codes:        Buccola,Allan C Sep 23, 2015, 1:49 PM  1:57 PM  Etta Grandchild, PT, DPT [place] License # [id number]

## 2015-08-28 NOTE — Progress Notes (Signed)
Collin [name]: Collin [name]    MR#:  [id number]  DATE OF BIRTH:  [birth year]  SUBJECTIVE:   Shortness of breath improved, still having some abdominal pain. Creatinine is improving Clear urine is draining from both the nephrostomy tube and Foley catheter.  REVIEW OF SYSTEMS:    Review of Systems  Constitutional: Negative for fever and chills.  HENT: Negative for congestion and tinnitus.   Eyes: Negative for blurred vision and double vision.  Respiratory: Positive for shortness of breath ( improved). Negative for cough and wheezing.   Cardiovascular: Negative for chest pain, orthopnea and PND.  Gastrointestinal: Positive for abdominal pain (right flank pain). Negative for nausea, vomiting and diarrhea.  Genitourinary: Negative for dysuria and hematuria.  Neurological: Positive for weakness (generalized). Negative for dizziness, sensory change and focal weakness.  All other systems reviewed and are negative.   Nutrition: Regular soft Tolerating Diet: Yes Tolerating PT: Evaluation noted  DRUG ALLERGIES:   Allergies  Allergen Reactions  . Lisinopril Swelling    Pt reports throat swelling.  . Metoprolol     Other reaction(s): Other (See Comments) Caused ringing in ears    VITALS:  Blood pressure 125/74, pulse 84, temperature 96.7 F (35.9 C), temperature source Axillary, resp. rate 18, height 6\' 2"  (1.88 m), weight 93.985 kg (207 lb 3.2 oz), SpO2 91 %.  PHYSICAL EXAMINATION:   Physical Exam  GENERAL:  74 y.o.-year-old patient sitting up in bed in no acute distress.  EYES: Pupils equal, round, reactive to light and accommodation. No scleral icterus. Extraocular muscles intact.  HEENT: Head atraumatic, normocephalic. Oropharynx and nasopharynx clear.  NECK:  Supple, no jugular venous distention. No thyroid enlargement, no tenderness.  LUNGS: Prolonged inspiratory and expiratory phase, no wheezing, rales, rhonchi.  No use of accessory muscles of respiration.  CARDIOVASCULAR: S1, S2 normal. No murmurs, rubs, or gallops.  ABDOMEN: Soft, tender in the right flank area, no rebound, rigidity, nondistended. Bowel sounds present. No organomegaly or mass.  EXTREMITIES: No cyanosis, clubbing, +1-2 pitting edema bilaterally   NEUROLOGIC: Cranial nerves II through XII are intact. No focal Motor or sensory deficits b/l.   PSYCHIATRIC: The patient is alert and oriented x 3.  SKIN: No obvious rash, lesion, or ulcer.    LABORATORY PANEL:   CBC  Recent Labs Lab 08/28/15 0416  WBC 13.2*  HGB 11.9*  HCT 39.4*  PLT 175   ------------------------------------------------------------------------------------------------------------------  Chemistries   Recent Labs Lab 08/28/15 0416  NA 129*  K 4.9  CL 96*  CO2 23  GLUCOSE 128*  BUN 85*  CREATININE 3.73*  CALCIUM 8.4*   ------------------------------------------------------------------------------------------------------------------  Cardiac Enzymes  Recent Labs Lab 08/23/15 0602  TROPONINI 0.04*   ------------------------------------------------------------------------------------------------------------------  RADIOLOGY:  No results found.   ASSESSMENT AND PLAN:   74 year old male with past medical history of COPD, chronic systolic CHF, nephrolithiasis, hyperlipidemia, hypertension, GERD presented to the hospital due to shortness of breath and also noted to have nephrolithiasis.  #1 COPD exacerbation-clinically improved since admission. -Continue prednisone taper, continue budesonide nebs, albuterol nebulizers, continue Spiriva -Continue ceftriaxone, Zithromax, vancomycin and follow clinically.  #2 chronic systolic CHF-clinically patient is not in congestive heart failure. -Continue Coreg, hold Lasix given renal failure.  #3 acute on chronic renal failure-this is likely secondary to underlying urinary retention and nephrolithiasis.  Patient is status post Foley catheter placement, and also percutaneous right-sided nephrostomy tube placement. -Creatinine is a bit improved today. Urine output is  good. Appreciate nephrology input and no need for acute dialysis. Follow BUN and creatinine renal dose measures and avoid nephrotoxins.  #4 pyelonephritis with yeast-continue Diflucan.  #5 elevated troponin-likely the setting of demand ischemia. No evidence of acute coronary syndrome. No chest pain.  #6 anxiety-continue Xanax.  #7 GERD-continue Protonix.  #8 tobacco abuse-continue Nicorette gum.   All the records are reviewed and case discussed with Care Management/Social Workerr. Management plans discussed with the patient, family and they are in agreement.  CODE STATUS: Full  DVT Prophylaxis: Heparin subcutaneous  TOTAL TIME TAKING CARE OF THIS PATIENT: 30 minutes.   POSSIBLE D/C IN 1-2 DAYS, DEPENDING ON CLINICAL CONDITION.   Collin [name] M.D on 08/28/2015 at 4:10 PM  Between 7am to 6pm - Pager - [phone]  After 6pm go to [email or web address] - password [email or web address] [facility]  Office  [phone]  CC: Primary care physician; [name], MD

## 2015-08-28 NOTE — Progress Notes (Signed)
[facility]  ROUNDING NOTE   Subjective:   Wife at bedside Creatinine 3.73 (4.55) UOP 2200 - more clear today.   Objective:  Vital signs in last 24 hours:  Temp:  [97.4 F (36.3 C)-97.8 F (36.6 C)] 97.4 F (36.3 C) (12/16 2053) Pulse Rate:  [74-77] 74 (12/16 2053) Resp:  [21-24] 24 (12/16 2053) BP: (116-119)/(65-75) 116/75 mmHg (12/16 2053) SpO2:  [96 %-98 %] 96 % (12/17 0804) Weight:  [93.985 kg (207 lb 3.2 oz)] 93.985 kg (207 lb 3.2 oz) (12/17 5462)  Weight change: 3.946 kg (8 lb 11.2 oz) Filed Weights   08/26/15 0425 08/27/15 0647 08/28/15 0613  Weight: 90.447 kg (199 lb 6.4 oz) 90.039 kg (198 lb 8 oz) 93.985 kg (207 lb 3.2 oz)    Intake/Output: I/O last 3 completed shifts: In: 360 [P.O.:360] Out: 2600 [Urine:2600]   Intake/Output this shift:  Total I/O In: 120 [P.O.:120] Out: -   Physical Exam: General: NAD  Head: Normocephalic, atraumatic. Moist oral mucosal membranes  Eyes: Anicteric  Neck: Supple, trachea midline  Lungs:  Basilar rales, normal effort, right pleurex in place  Heart: S1S2 no rubs  Abdomen:  Soft, nontender, BS present, right nephrostomy in place  Extremities: trace peripheral edema.  Neurologic: Nonfocal, moving all four extremities  Skin: No lesions       Basic Metabolic Panel:  Recent Labs Lab 08/24/15 0520 08/24/15 1332 08/25/15 0519 08/26/15 0840 08/27/15 0515 08/28/15 0416  NA 131* 133* 127* 131*  --  129*  K 6.2* 5.1 4.0 4.4  --  4.9  CL 100* 96* 96* 97*  --  96*  CO2 20* 22 20* 20*  --  23  GLUCOSE 85 105* 117* 205*  --  128*  BUN 66* 71* 76* 83*  --  85*  CREATININE 4.28* 4.62* 4.99* 5.14* 4.55* 3.73*  CALCIUM 8.6* 8.6* 7.9* 8.4*  --  8.4*  PHOS  --   --   --   --   --  6.0*    Liver Function Tests:  Recent Labs Lab 08/28/15 0416  ALBUMIN 2.7*   No results for input(s): LIPASE, AMYLASE in the last 168 hours. No results for input(s): AMMONIA in the last 168 hours.  CBC:  Recent Labs Lab  08/23/15 0602 08/24/15 0520 08/25/15 0519 08/26/15 0840 08/28/15 0416  WBC 24.4* 20.6* 15.3* 13.5* 13.2*  HGB 13.0 12.4* 11.4* 11.2* 11.9*  HCT 42.3 40.1 36.9* 36.3* 39.4*  MCV 75.0* 75.2* 74.0* 74.0* 74.1*  PLT 215 196 184 171 175    Cardiac Enzymes:  Recent Labs Lab 08/22/15 1322 08/22/15 1950 08/23/15 0120 08/23/15 0602  TROPONINI 0.13* 0.04* 0.04* 0.04*    BNP: Invalid input(s): POCBNP  CBG: No results for input(s): GLUCAP in the last 168 hours.  Microbiology: Results for orders placed or performed during the hospital encounter of 08/22/15  Blood culture (routine x 2)     Status: None   Collection Time: 08/22/15  4:39 PM  Result Value Ref Range Status   Specimen Description BLOOD RIGHT FOREARM  Final   Special Requests   Final    BOTTLES DRAWN AEROBIC AND ANAEROBIC  AER [place]ys ANA 2CC   Culture NO GROWTH 5 DAYS  Final   Report Status 08/27/2015 FINAL  Final  Blood culture (routine x 2)     Status: None   Collection Time: 08/22/15  4:40 PM  Result Value Ref Range Status   Specimen Description BLOOD LEFT ARM  Final  Special Requests BOTTLES DRAWN AEROBIC AND ANAEROBIC  1CC  Final   Culture NO GROWTH 5 DAYS  Final   Report Status 08/27/2015 FINAL  Final  Urine culture     Status: None   Collection Time: 08/23/15  3:55 PM  Result Value Ref Range Status   Specimen Description URINE, RANDOM  Final   Special Requests NONE  Final   Culture 80,000 COLONIES/ml CANDIDA ALBICANS  Final   Report Status 08/27/2015 FINAL  Final  Body fluid culture     Status: None (Preliminary result)   Collection Time: 08/25/15 11:00 AM  Result Value Ref Range Status   Specimen Description FLUID  Final   Special Requests NONE  Final   Gram Stain TOO NUMEROUS TO COUNT WBC SEEN FEW YEAST   Final   Culture HEAVY GROWTH YEAST IDENTIFICATION TO FOLLOW   Final   Report Status PENDING  Incomplete  Culture, expectorated sputum-assessment     Status: None   Collection Time: 08/27/15   2:37 PM  Result Value Ref Range Status   Specimen Description SPUTUM  Final   Special Requests Immunocompromised  Final   Sputum evaluation THIS SPECIMEN IS ACCEPTABLE FOR SPUTUM CULTURE  Final   Report Status 08/27/2015 FINAL  Final  Culture, respiratory (NON-Expectorated)     Status: None (Preliminary result)   Collection Time: 08/27/15  2:37 PM  Result Value Ref Range Status   Specimen Description SPUTUM  Final   Special Requests Immunocompromised Reflexed from [id number]  Final   Gram Stain PENDING  Incomplete   Culture NO GROWTH < 24 HOURS  Final   Report Status PENDING  Incomplete    Coagulation Studies: No results for input(s): LABPROT, INR in the last 72 hours.  Urinalysis: No results for input(s): COLORURINE, LABSPEC, PHURINE, GLUCOSEU, HGBUR, BILIRUBINUR, KETONESUR, PROTEINUR, UROBILINOGEN, NITRITE, LEUKOCYTESUR in the last 72 hours.  Invalid input(s): APPERANCEUR    Imaging: Dg Chest 1 View  08/26/2015  CLINICAL DATA:  Cough EXAM: CHEST 1 VIEW COMPARISON:  08/22/2015 chest radiograph. FINDINGS: Stable cardiomediastinal silhouette with mild cardiomegaly. No pneumothorax. Stable small right pleural effusion. New small left pleural effusion. Stable mild pulmonary edema. Left lower lobe opacity, not appreciably changed. IMPRESSION: 1. Stable mild cardiomegaly with mild pulmonary edema, suggesting mild congestive heart failure. 2. Stable small right pleural effusion and new small left pleural effusion. 3. Stable left lower lobe lung opacity, favor atelectasis. Electronically Signed   By: Ilona Sorrel M.D.   On: 08/26/2015 14:32     Medications:   . sodium chloride 10 mL/hr at 08/25/15 1033   . azithromycin  500 mg Intravenous Q24H  . budesonide (PULMICORT) nebulizer solution  0.25 mg Nebulization BID  . carvedilol  12.5 mg Oral BID WC  . cefTRIAXone (ROCEPHIN)  IV  1 g Intravenous Q24H  . docusate sodium  100 mg Oral BID  . fluconazole (DIFLUCAN) IV  200 mg Intravenous Q24H   . heparin subcutaneous  5,000 Units Subcutaneous Q12H  . pantoprazole  40 mg Oral Daily  . polyethylene glycol  17 g Oral Daily  . predniSONE  20 mg Oral Q breakfast  . sodium chloride  3 mL Intravenous Q12H  . tiotropium  18 mcg Inhalation Daily  . vancomycin  1,000 mg Intravenous Q12H   acetaminophen **OR** acetaminophen, albuterol, ALPRAZolam, guaiFENesin-dextromethorphan, HYDROcodone-acetaminophen, iohexol, lactulose, lidocaine (PF), nicotine polacrilex, nitroGLYCERIN, ondansetron **OR** ondansetron (ZOFRAN) IV, oxyCODONE-acetaminophen  Assessment/ Plan:  74 y.o.white male with hypertension, nephrolithiasis, COPD, GERD, anxiety, AAA repair,  recurrent right pleural effusions  1.  Acute renal failure on CKD stage III: baseline EGFR 48 from 11/1. With nephrotic range proteinuria. Hyperkalemia has resolved, secondary to obstructive uropathy -  Hydronephrosis could certainly be resulting in some acute renal failure now though we suspect that the hydronephrosis has been quite chronic in nature. Percutaneous nephrostomy placed 08/25/15. -  Renal function is improving. no indication for HD at the moment, will continue to monitor outpt from the nephrostomy and follow renal function.   2.  Hypertension: well controlled.  - carvedilol  3. Secondary hyperparathyroidism: PTH 54 as outpatient. Calcium and phosphorus at goal.    LOS: [place], [place] 12/17/201611:44 AM

## 2015-08-28 NOTE — Progress Notes (Addendum)
ANTIBIOTIC CONSULT NOTE -follow up  Pharmacy Consult for vancomycin Indication: pneumonia  Allergies  Allergen Reactions  . Lisinopril Swelling    Pt reports throat swelling.  . Metoprolol     Other reaction(s): Other (See Comments) Caused ringing in ears    Patient Measurements: Height: 6\' 2"  (188 cm) Weight: 207 lb 3.2 oz (93.985 kg) IBW/kg (Calculated) : 82.2 Adjusted Body Weight: n/a  Vital Signs: Temp: 98.2 F (36.8 C) (12/17 2038) Temp Source: Oral (12/17 2038) BP: 128/76 mmHg (12/17 2038) Pulse Rate: 72 (12/17 2038)  Labs:  Recent Labs  08/26/15 0840 08/27/15 0515 08/28/15 0416  WBC 13.5*  --  13.2*  HGB 11.2*  --  11.9*  PLT 171  --  175  CREATININE 5.14* 4.55* 3.73*   Estimated Creatinine Clearance: 20.8 mL/min (by C-G formula based on Cr of 3.73).  Recent Labs  08/27/15 1406 08/28/15 2012  VANCOTROUGH  --  24*  VANCORANDOM 7  --      Assessment: Pharmacy consulted to dose vancomycin in this 74 year old male for pneumonia. Patient is currently being treated for both PNA and UTI and had a nephrostomy tube placed yesterday. Per MD, patient is coughing up green sputum/material today and is adding vancomycin.  Current anti-infectives are as follows: Day #5 azithromycin 500 IV daily (start: 12/11) for PNA Day #5 ceftriaxone 1 g IV daily (start: 12/11) for PNA/UTI Day #2 fluconazole 200 mg IV daily (start: 12/14) for yeast in urine after invasive procedure Day #1 vancomycin (start: 12/15) for PNA  Patient has poor renal function, with a CrCl of ~ 15 mL/min and is not on HD  Goal of Therapy:  Vancomycin trough level 15-20 mcg/ml  Plan:  Give vancomycin 1000 mg x1 dose (~11 mg/kg) Given renal function, will dose off of random vancomycin levels Ordered a 24 hour vancomycin level for tomorrow at 1330  12/16:  Vanc level @ 14:00 = 7 mcg/mL Will order Vancomycin 1 gm IV Q12H top start 12/16 @ 20:00 and draw 1st trough before 4th dose on 12/18 @ 7:30.    12/17: Scr 3.73  Crcl ~21 ml/min. Patient on Vancomycin 1 gram IV q12h. Pharmacokinetic calc: Ke=0.022  T1/2=31.5  Vd=65.8. Recommended dosing would be Vancomycin 1250mg  IV q36h.  Will check a Vancomycin trough tonight 12/17 at 2030, about 12 hours after last dose to assess the clearance of a Q12h regimen. (This is prior to 3rd dose of q12h regimen and prior to 4th dose overall)  12/17: vanc trough was supratherapeutic at 24 mcg/mL and was drawn prior to steady state. Ordered a level for tomorrow morning at 0830 which is 24 hours from last dose. Will start patient on 1250 mg IV q 24 hours to start tomorrow morning.  Pharmacy will continue to monitor, thank you for the consult.  Theodis Shove PharmD Clinical Pharmacist 08/28/2015 ,10:09 PM

## 2015-08-28 NOTE — Progress Notes (Signed)
ANTIBIOTIC CONSULT NOTE -follow up  Pharmacy Consult for vancomycin Indication: pneumonia  Allergies  Allergen Reactions  . Lisinopril Swelling    Pt reports throat swelling.  . Metoprolol     Other reaction(s): Other (See Comments) Caused ringing in ears    Patient Measurements: Height: 6\' 2"  (188 cm) Weight: 207 lb 3.2 oz (93.985 kg) IBW/kg (Calculated) : 82.2 Adjusted Body Weight: n/a  Vital Signs: Temp: 96.7 F (35.9 C) (12/17 1228) Temp Source: Axillary (12/17 1228) BP: 125/74 mmHg (12/17 1228) Pulse Rate: 78 (12/17 1228)  Labs:  Recent Labs  08/26/15 0840 08/27/15 0515 08/28/15 0416  WBC 13.5*  --  13.2*  HGB 11.2*  --  11.9*  PLT 171  --  175  CREATININE 5.14* 4.55* 3.73*   Estimated Creatinine Clearance: 20.8 mL/min (by C-G formula based on Cr of 3.73).  Recent Labs  08/27/15 1406  VANCORANDOM 7     Assessment: Pharmacy consulted to dose vancomycin in this 74 year old male for pneumonia. Patient is currently being treated for both PNA and UTI and had a nephrostomy tube placed yesterday. Per MD, patient is coughing up green sputum/material today and is adding vancomycin.  Current anti-infectives are as follows: Day #5 azithromycin 500 IV daily (start: 12/11) for PNA Day #5 ceftriaxone 1 g IV daily (start: 12/11) for PNA/UTI Day #2 fluconazole 200 mg IV daily (start: 12/14) for yeast in urine after invasive procedure Day #1 vancomycin (start: 12/15) for PNA  Patient has poor renal function, with a CrCl of ~ 15 mL/min and is not on HD  Goal of Therapy:  Vancomycin trough level 15-20 mcg/ml  Plan:  Give vancomycin 1000 mg x1 dose (~11 mg/kg) Given renal function, will dose off of random vancomycin levels Ordered a 24 hour vancomycin level for tomorrow at 1330  12/16:  Vanc level @ 14:00 = 7 mcg/mL Will order Vancomycin 1 gm IV Q12H top start 12/16 @ 20:00 and draw 1st trough before 4th dose on 12/18 @ 7:30.   12/17: Scr 3.73  Crcl ~21 ml/min.  Patient on Vancomycin 1 gram IV q12h. Pharmacokinetic calc: Ke=0.022  T1/2=31.5  Vd=65.8. Recommended dosing would be Vancomycin 1250mg  IV q36h.  Will check a Vancomycin trough tonight 12/17 at 2030, about 12 hours after last dose to assess the clearance of a Q12h regimen. (This is prior to 3rd dose of q12h regimen and prior to 4th dose overall)  (Dose has already been entered as 1250mg  IV q36h to start tomorrow 12/18 but could be changed based upon interpretation of level tonight 12/17).   Pharmacy will continue to monitor, thank you for the consult.  Chinita Greenland PharmD Clinical Pharmacist 08/28/2015 ,1:31 PM

## 2015-08-29 LAB — CBC
HCT: 40.2 % (ref 40.0–52.0)
Hemoglobin: 12.4 g/dL — ABNORMAL LOW (ref 13.0–18.0)
MCH: 22.7 pg — AB (ref 26.0–34.0)
MCHC: 30.9 g/dL — ABNORMAL LOW (ref 32.0–36.0)
MCV: 73.6 fL — AB (ref 80.0–100.0)
PLATELETS: 187 10*3/uL (ref 150–440)
RBC: 5.46 MIL/uL (ref 4.40–5.90)
RDW: 19.5 % — AB (ref 11.5–14.5)
WBC: 11.7 10*3/uL — AB (ref 3.8–10.6)

## 2015-08-29 LAB — BASIC METABOLIC PANEL
Anion gap: 8 (ref 5–15)
BUN: 79 mg/dL — AB (ref 6–20)
CALCIUM: 8.4 mg/dL — AB (ref 8.9–10.3)
CO2: 22 mmol/L (ref 22–32)
Chloride: 98 mmol/L — ABNORMAL LOW (ref 101–111)
Creatinine, Ser: 3.01 mg/dL — ABNORMAL HIGH (ref 0.61–1.24)
GFR calc Af Amer: 22 mL/min — ABNORMAL LOW (ref >60)
GFR, EST NON AFRICAN AMERICAN: 19 mL/min — AB (ref >60)
GLUCOSE: 133 mg/dL — AB (ref 65–99)
Potassium: 4.7 mmol/L (ref 3.5–5.1)
SODIUM: 128 mmol/L — AB (ref 135–145)

## 2015-08-29 LAB — CULTURE, RESPIRATORY W GRAM STAIN: Culture: NORMAL

## 2015-08-29 LAB — CULTURE, RESPIRATORY

## 2015-08-29 LAB — VANCOMYCIN, RANDOM: Vancomycin Rm: 18 ug/mL

## 2015-08-29 MED ORDER — MAGNESIUM HYDROXIDE 400 MG/5ML PO SUSP
30.0000 mL | Freq: Every day | ORAL | Status: DC | PRN
  Administered 2015-08-29: 30 mL via ORAL
  Filled 2015-08-29: qty 30

## 2015-08-29 MED ORDER — SODIUM CHLORIDE 1 G PO TABS
1.0000 g | ORAL_TABLET | Freq: Two times a day (BID) | ORAL | Status: DC
  Administered 2015-08-29 – 2015-08-30 (×2): 1 g via ORAL
  Filled 2015-08-29 (×4): qty 1

## 2015-08-29 NOTE — Progress Notes (Signed)
[facility] [facility] [place] NAME: Collin [name]    MR#:  [id number]  DATE OF BIRTH:  14-Jul-1941  SUBJECTIVE:   Shortness of breath improving, still having some abdominal pain but also improved. Creatinine is improving Clear urine is draining from both the nephrostomy tube and Foley catheter.  REVIEW OF SYSTEMS:    Review of Systems  Constitutional: Negative for fever and chills.  HENT: Negative for congestion and tinnitus.   Eyes: Negative for blurred vision and double vision.  Respiratory: Positive for shortness of breath ( improved). Negative for cough and wheezing.   Cardiovascular: Negative for chest pain, orthopnea and PND.  Gastrointestinal: Positive for abdominal pain (right flank pain). Negative for nausea, vomiting and diarrhea.  Genitourinary: Negative for dysuria and hematuria.  Neurological: Positive for weakness (generalized). Negative for dizziness, sensory change and focal weakness.  All other systems reviewed and are negative.   Nutrition: Regular soft Tolerating Diet: Yes Tolerating PT: Evaluation noted  DRUG ALLERGIES:   Allergies  Allergen Reactions  . Lisinopril Swelling    Pt reports throat swelling.  . Metoprolol     Other reaction(s): Other (See Comments) Caused ringing in ears    VITALS:  Blood pressure 132/74, pulse 69, temperature 97.6 F (36.4 C), temperature source Oral, resp. rate 18, height 6\' 2"  (1.88 m), weight 90.447 kg (199 lb 6.4 oz), SpO2 97 %.  PHYSICAL EXAMINATION:   Physical Exam  GENERAL:  74 y.o.-year-old patient sitting up in bed in no acute distress.  EYES: Pupils equal, round, reactive to light and accommodation. No scleral icterus. Extraocular muscles intact.  HEENT: Head atraumatic, normocephalic. Oropharynx and nasopharynx clear.  NECK:  Supple, no jugular venous distention. No thyroid enlargement, no tenderness.  LUNGS: Prolonged inspiratory and expiratory phase, no wheezing,  rales, rhonchi. No use of accessory muscles of respiration.  CARDIOVASCULAR: S1, S2 normal. No murmurs, rubs, or gallops.  ABDOMEN: Soft, tender in the right flank area, no rebound, rigidity, nondistended. Bowel sounds present. No organomegaly or mass.  EXTREMITIES: No cyanosis, clubbing, +1-2 pitting edema bilaterally   NEUROLOGIC: Cranial nerves II through XII are intact. No focal Motor or sensory deficits b/l.   PSYCHIATRIC: The patient is alert and oriented x 3.  SKIN: No obvious rash, lesion, or ulcer.    LABORATORY PANEL:   CBC  Recent Labs Lab 08/29/15 0803  WBC 11.7*  HGB 12.4*  HCT 40.2  PLT 187   ------------------------------------------------------------------------------------------------------------------  Chemistries   Recent Labs Lab 08/29/15 0803  NA 128*  K 4.7  CL 98*  CO2 22  GLUCOSE 133*  BUN 79*  CREATININE 3.01*  CALCIUM 8.4*   ------------------------------------------------------------------------------------------------------------------  Cardiac Enzymes  Recent Labs Lab 08/23/15 0602  TROPONINI 0.04*   ------------------------------------------------------------------------------------------------------------------  RADIOLOGY:  No results found.   ASSESSMENT AND PLAN:   74 year old male with past medical history of COPD, chronic systolic CHF, nephrolithiasis, hyperlipidemia, hypertension, GERD presented to the hospital due to shortness of breath and also noted to have nephrolithiasis.  #1 COPD exacerbation-clinically much improved since admission. -Continue prednisone taper, continue budesonide nebs, albuterol nebulizers, continue Spiriva -Continue ceftriaxone, Zithromax and we'll DC vancomycin. Switch to oral Levaquin upon discharge.  #2 chronic systolic CHF-clinically patient is not in congestive heart failure. -Continue Coreg, hold Lasix given renal failure.  #3 acute on chronic renal failure-this is likely secondary to  underlying urinary retention and nephrolithiasis. Patient is status post Foley catheter placement, and also percutaneous right-sided nephrostomy tube  placement. -Creatinine is improving and urine output is good. DC Foley catheter today. Appreciate nephrology input and no need for acute dialysis. Follow BUN and creatinine renal dose meds and avoid nephrotoxins.  #4 pyelonephritis with yeast-continue Diflucan.  #5 elevated troponin-likely the setting of demand ischemia. No evidence of acute coronary syndrome. No chest pain.  #6 anxiety-continue Xanax.  #7 GERD-continue Protonix.  #8 tobacco abuse-continue Nicorette gum.  Likely DC home in the next 24-48 hours  All the records are reviewed and case discussed with Care Management/Social Workerr. Management plans discussed with the patient, family and they are in agreement.  CODE STATUS: Full  DVT Prophylaxis: Heparin subcutaneous  TOTAL TIME TAKING CARE OF THIS PATIENT: 30 minutes.   POSSIBLE D/C IN 1-2 DAYS, DEPENDING ON CLINICAL CONDITION.   Henreitta Leber M.D on 08/29/2015 at 2:53 PM  Between 7am to 6pm - Pager - [phone]  After 6pm go to [email or web address] - password [email or web address] [facility]  Office  [phone]  CC: Primary care physician; [name], MD

## 2015-08-29 NOTE — Progress Notes (Signed)
[facility]  ROUNDING NOTE   Subjective:   Na 128  Creatinine 3.01 (3.73) (4.55) UOP 2600 (2200)    Objective:  Vital signs in last 24 hours:  Temp:  [96.7 F (35.9 C)-98.2 F (36.8 C)] 97.3 F (36.3 C) (12/18 0441) Pulse Rate:  [72-84] 77 (12/18 0441) Resp:  [18-24] 24 (12/18 0441) BP: (125-131)/(74-83) 131/83 mmHg (12/18 0441) SpO2:  [91 %-97 %] 96 % (12/18 0804) Weight:  [90.447 kg (199 lb 6.4 oz)] 90.447 kg (199 lb 6.4 oz) (12/18 0500)  Weight change: -3.538 kg (-7 lb 12.8 oz) Filed Weights   08/27/15 0647 08/28/15 0613 08/29/15 0500  Weight: 90.039 kg (198 lb 8 oz) 93.985 kg (207 lb 3.2 oz) 90.447 kg (199 lb 6.4 oz)    Intake/Output: I/O last 3 completed shifts: In: 70 [P.O.:120; IV Piggyback:600] Out: 3400 [Urine:3400]   Intake/Output this shift:     Physical Exam: General: NAD  Head: Normocephalic, atraumatic. Moist oral mucosal membranes  Eyes: Anicteric  Neck: Supple, trachea midline  Lungs:  Basilar rales, normal effort, right pleurex in place  Heart: S1S2 no rubs  Abdomen:  Soft, nontender, BS present, right nephrostomy in place  Extremities: trace peripheral edema.  Neurologic: Nonfocal, moving all four extremities  Skin: No lesions       Basic Metabolic Panel:  Recent Labs Lab 08/24/15 1332 08/25/15 0519 08/26/15 0840 08/27/15 0515 08/28/15 0416 08/29/15 0803  NA 133* 127* 131*  --  129* 128*  K 5.1 4.0 4.4  --  4.9 4.7  CL 96* 96* 97*  --  96* 98*  CO2 22 20* 20*  --  23 22  GLUCOSE 105* 117* 205*  --  128* 133*  BUN 71* 76* 83*  --  85* 79*  CREATININE 4.62* 4.99* 5.14* 4.55* 3.73* 3.01*  CALCIUM 8.6* 7.9* 8.4*  --  8.4* 8.4*  PHOS  --   --   --   --  6.0*  --     Liver Function Tests:  Recent Labs Lab 08/28/15 0416  ALBUMIN 2.7*   No results for input(s): LIPASE, AMYLASE in the last 168 hours. No results for input(s): AMMONIA in the last 168 hours.  CBC:  Recent Labs Lab 08/24/15 0520 08/25/15 0519  08/26/15 0840 08/28/15 0416 08/29/15 0803  WBC 20.6* 15.3* 13.5* 13.2* 11.7*  HGB 12.4* 11.4* 11.2* 11.9* 12.4*  HCT 40.1 36.9* 36.3* 39.4* 40.2  MCV 75.2* 74.0* 74.0* 74.1* 73.6*  PLT 196 184 171 175 187    Cardiac Enzymes:  Recent Labs Lab 08/22/15 1322 08/22/15 1950 08/23/15 0120 08/23/15 0602  TROPONINI 0.13* 0.04* 0.04* 0.04*    BNP: Invalid input(s): POCBNP  CBG: No results for input(s): GLUCAP in the last 168 hours.  Microbiology: Results for orders placed or performed during the hospital encounter of 08/22/15  Blood culture (routine x 2)     Status: None   Collection Time: 08/22/15  4:39 PM  Result Value Ref Range Status   Specimen Description BLOOD RIGHT FOREARM  Final   Special Requests   Final    BOTTLES DRAWN AEROBIC AND ANAEROBIC  AER [place] ANA 2CC   Culture NO GROWTH 5 DAYS  Final   Report Status 08/27/2015 FINAL  Final  Blood culture (routine x 2)     Status: None   Collection Time: 08/22/15  4:40 PM  Result Value Ref Range Status   Specimen Description BLOOD LEFT ARM  Final   Special Requests BOTTLES DRAWN  AEROBIC AND ANAEROBIC  1CC  Final   Culture NO GROWTH 5 DAYS  Final   Report Status 08/27/2015 FINAL  Final  Urine culture     Status: None   Collection Time: 08/23/15  3:55 PM  Result Value Ref Range Status   Specimen Description URINE, RANDOM  Final   Special Requests NONE  Final   Culture 80,000 COLONIES/ml CANDIDA ALBICANS  Final   Report Status 08/27/2015 FINAL  Final  Body fluid culture     Status: None   Collection Time: 08/25/15 11:00 AM  Result Value Ref Range Status   Specimen Description FLUID  Final   Special Requests NONE  Final   Gram Stain TOO NUMEROUS TO COUNT WBC SEEN FEW YEAST   Final   Culture HEAVY GROWTH CANDIDA SPECIES  Final   Report Status 08/28/2015 FINAL  Final  Culture, expectorated sputum-assessment     Status: None   Collection Time: 08/27/15  2:37 PM  Result Value Ref Range Status   Specimen Description  SPUTUM  Final   Special Requests Immunocompromised  Final   Sputum evaluation THIS SPECIMEN IS ACCEPTABLE FOR SPUTUM CULTURE  Final   Report Status 08/27/2015 FINAL  Final  Culture, respiratory (NON-Expectorated)     Status: None (Preliminary result)   Collection Time: 08/27/15  2:37 PM  Result Value Ref Range Status   Specimen Description SPUTUM  Final   Special Requests Immunocompromised Reflexed from [id number]  Final   Gram Stain PENDING  Incomplete   Culture NO GROWTH < 24 HOURS  Final   Report Status PENDING  Incomplete    Coagulation Studies: No results for input(s): LABPROT, INR in the last 72 hours.  Urinalysis: No results for input(s): COLORURINE, LABSPEC, PHURINE, GLUCOSEU, HGBUR, BILIRUBINUR, KETONESUR, PROTEINUR, UROBILINOGEN, NITRITE, LEUKOCYTESUR in the last 72 hours.  Invalid input(s): APPERANCEUR    Imaging: No results found.   Medications:   . sodium chloride 10 mL/hr at 08/25/15 1033   . azithromycin  500 mg Intravenous Q24H  . budesonide (PULMICORT) nebulizer solution  0.25 mg Nebulization BID  . carvedilol  12.5 mg Oral BID WC  . cefTRIAXone (ROCEPHIN)  IV  1 g Intravenous Q24H  . docusate sodium  100 mg Oral BID  . fluconazole (DIFLUCAN) IV  200 mg Intravenous Q24H  . heparin subcutaneous  5,000 Units Subcutaneous Q12H  . pantoprazole  40 mg Oral Daily  . polyethylene glycol  17 g Oral Daily  . predniSONE  20 mg Oral Q breakfast  . sodium chloride  3 mL Intravenous Q12H  . tiotropium  18 mcg Inhalation Daily  . vancomycin  1,250 mg Intravenous Q24H   acetaminophen **OR** acetaminophen, albuterol, ALPRAZolam, guaiFENesin-dextromethorphan, HYDROcodone-acetaminophen, iohexol, lactulose, lidocaine (PF), magnesium hydroxide, nicotine polacrilex, nitroGLYCERIN, ondansetron **OR** ondansetron (ZOFRAN) IV, oxyCODONE-acetaminophen  Assessment/ Plan:  74 y.o.white male with hypertension, nephrolithiasis, COPD, GERD, anxiety, AAA repair, recurrent right pleural  effusions  1.  Acute renal failure on CKD stage III: baseline creatinine 1.45, EGFR 48 from 11/1. With nephrotic range proteinuria. Hyperkalemia has resolved, secondary to obstructive uropathy -  Hydronephrosis could certainly be resulting in some acute renal failure now though we suspect that the hydronephrosis has been quite chronic in nature. Percutaneous nephrostomy placed 08/25/15. -  Renal function is improving. no indication for HD at the moment, will continue to monitor outpt from the nephrostomy and follow renal function.   2.  Hypertension: well controlled.  - carvedilol  3. Secondary hyperparathyroidism: PTH 54 as outpatient.  Calcium and phosphorus at goal.   4. Hyponatremia: 128. Due to renal losses most likely. Post ATN/post obstructive uropathy diuresis. - sodium chloride 1g PO    LOS: [place], [place] 12/18/20169:14 AM

## 2015-08-29 NOTE — Progress Notes (Signed)
ANTIBIOTIC CONSULT NOTE -follow up  Pharmacy Consult for Vancomycin/Fluconazole Indication: pneumonia/Candiduria  Allergies  Allergen Reactions  . Lisinopril Swelling    Pt reports throat swelling.  . Metoprolol     Other reaction(s): Other (See Comments) Caused ringing in ears    Patient Measurements: Height: 6\' 2"  (188 cm) Weight: 199 lb 6.4 oz (90.447 kg) IBW/kg (Calculated) : 82.2 Adjusted Body Weight: n/a  Vital Signs: Temp: 97.3 F (36.3 C) (12/18 0441) Temp Source: Oral (12/18 0441) BP: 131/83 mmHg (12/18 0441) Pulse Rate: 77 (12/18 0441)  Labs:  Recent Labs  08/27/15 0515 08/28/15 0416 08/29/15 0803  WBC  --  13.2* 11.7*  HGB  --  11.9* 12.4*  PLT  --  175 187  CREATININE 4.55* 3.73* 3.01*   Estimated Creatinine Clearance: 25.8 mL/min (by C-G formula based on Cr of 3.01).  Recent Labs  08/27/15 1406 08/28/15 2012 08/29/15 0803  VANCOTROUGH  --  24*  --   VANCORANDOM 7  --  18     Assessment: Pharmacy consulted to dose vancomycin in this 74 year old male for pneumonia. Patient is currently being treated for both PNA and UTI and had a nephrostomy tube placed yesterday. Per MD, patient is coughing up green sputum/material today and is adding vancomycin.   12/4: Patient underwent placement of a nephrostomy tube 12/14.  12/12: Ucx: + candida. Patient meets criteria for treatment due to invasive procedure. To start Fluconazole. For patients undergoing urologic procedures with candiduria, the recommended dose is fluconazole 400 mg daily. For patients with a CrCl < 50 mL/min, the recommendation is to give 50% of the recommended dose  Current anti-infectives are as follows: Day #8 azithromycin 500 IV daily (start: 12/11) for PNA Day #8 ceftriaxone 1 g IV daily (start: 12/11) for PNA/UTI Day #5 fluconazole 200 mg IV daily (start: 12/14) for yeast in urine after invasive procedure Day #4 vancomycin (start: 12/15) for PNA  12/11 Bcx: NGTD 12/12 Urine Cx= +  Candida albicans 12/14 Body fluid cx: Heavy growth candida species 12/16 Sputum cx: NG x 24 hrs  Patient has poor renal function and is not on HD  Goal of Therapy:  Vancomycin trough level 15-20 mcg/ml  Plan:  Give vancomycin 1000 mg x1 dose (~11 mg/kg) Given renal function, will dose off of random vancomycin levels Ordered a 24 hour vancomycin level for tomorrow at 1330  Fluconazole 200mg  IV daily  12/16:  Vanc level @ 14:00 = 7 mcg/mL Will order Vancomycin 1 gm IV Q12H top start 12/16 @ 20:00 and draw 1st trough before 4th dose on 12/18 @ 7:30.   12/17: Scr 3.73  Crcl ~21 ml/min. Patient on Vancomycin 1 gram IV q12h. Pharmacokinetic calc: Ke=0.022  T1/2=31.5  Vd=65.8. Recommended dosing would be Vancomycin 1250mg  IV q36h.  Will check a Vancomycin trough tonight 12/17 at 2030, about 12 hours after last dose to assess the clearance of a Q12h regimen. (This is prior to 3rd dose of q12h regimen and prior to 4th dose overall)  12/17: vanc trough was supratherapeutic at 24 mcg/mL and was drawn prior to steady state. Ordered a level for tomorrow morning at 0830 which is 24 hours from last dose. Will start patient on 1250 mg IV q 24 hours to start tomorrow morning.  12/18: Vanc random at 0800-(24 hrs after last dose)= 18 mcg/ml. Will continue with Vancomycin 1250mg  IV Q24h. WBC,Scr improving, watch renal fxn-F/u for next trough. Continue Fluconazole 200mg  IV Q24h.  F/u for switch to  PO-WBC improving.  Pharmacy will continue to monitor, thank you for the consult.  Theodis Shove PharmD Clinical Pharmacist 08/29/2015 ,9:04 AM

## 2015-08-30 LAB — RENAL FUNCTION PANEL
ANION GAP: 6 (ref 5–15)
Albumin: 3 g/dL — ABNORMAL LOW (ref 3.5–5.0)
BUN: 75 mg/dL — ABNORMAL HIGH (ref 6–20)
CHLORIDE: 99 mmol/L — AB (ref 101–111)
CO2: 25 mmol/L (ref 22–32)
Calcium: 8.6 mg/dL — ABNORMAL LOW (ref 8.9–10.3)
Creatinine, Ser: 2.77 mg/dL — ABNORMAL HIGH (ref 0.61–1.24)
GFR calc non Af Amer: 21 mL/min — ABNORMAL LOW (ref >60)
GFR, EST AFRICAN AMERICAN: 25 mL/min — AB (ref >60)
GLUCOSE: 132 mg/dL — AB (ref 65–99)
Phosphorus: 3.7 mg/dL (ref 2.5–4.6)
Potassium: 5.4 mmol/L — ABNORMAL HIGH (ref 3.5–5.1)
Sodium: 130 mmol/L — ABNORMAL LOW (ref 135–145)

## 2015-08-30 LAB — CBC
HCT: 41.8 % (ref 40.0–52.0)
HEMOGLOBIN: 12.8 g/dL — AB (ref 13.0–18.0)
MCH: 22.8 pg — AB (ref 26.0–34.0)
MCHC: 30.5 g/dL — AB (ref 32.0–36.0)
MCV: 74.6 fL — AB (ref 80.0–100.0)
Platelets: 199 10*3/uL (ref 150–440)
RBC: 5.61 MIL/uL (ref 4.40–5.90)
RDW: 19.7 % — ABNORMAL HIGH (ref 11.5–14.5)
WBC: 15.1 10*3/uL — ABNORMAL HIGH (ref 3.8–10.6)

## 2015-08-30 NOTE — Progress Notes (Signed)
Called report to 1A. Patient moved with belongings and wife to 1A.

## 2015-08-30 NOTE — Care Management Important Message (Signed)
Important Message  Patient Details  Name: KYANDRE CHLUDZINSKI MRN: [id number] Date of Birth: Feb 03, 1941   Medicare Important Message Given:  Yes    Juliann Pulse A Ivey Nembhard 08/30/2015, 1:04 PM

## 2015-08-30 NOTE — Progress Notes (Signed)
2 L of oxygen. No tele. Refused to move to another floor. Takes meds ok. A & O. Ambulated in the hallway and tolerated it well. Family at the bedside. Pt reported pain and received meds.Pt has no further concerns at this time.

## 2015-08-30 NOTE — Progress Notes (Signed)
Urology Consult Follow Up  Subjective: Breathing improving along with renal function.  Catheter out and voiding.    Anti-infectives: Anti-infectives    Start     Dose/Rate Route Frequency Ordered Stop   08/29/15 1500  vancomycin (VANCOCIN) 1,250 mg in sodium chloride 0.9 % 250 mL IVPB  Status:  Discontinued     1,250 mg 166.7 mL/hr over 90 Minutes Intravenous Every 36 hours 08/28/15 1326 08/28/15 2106   08/29/15 0900  vancomycin (VANCOCIN) 1,250 mg in sodium chloride 0.9 % 250 mL IVPB  Status:  Discontinued     1,250 mg 166.7 mL/hr over 90 Minutes Intravenous Every 24 hours 08/28/15 2207 08/29/15 1454   08/28/15 2215  vancomycin (VANCOCIN) 1,250 mg in sodium chloride 0.9 % 250 mL IVPB  Status:  Discontinued     1,250 mg 166.7 mL/hr over 90 Minutes Intravenous Every 36 hours 08/28/15 2203 08/28/15 2206   08/27/15 2000  vancomycin (VANCOCIN) IVPB 1000 mg/200 mL premix  Status:  Discontinued     1,000 mg 200 mL/hr over 60 Minutes Intravenous Every 12 hours 08/27/15 1903 08/28/15 1326   08/26/15 1400  vancomycin (VANCOCIN) IVPB 1000 mg/200 mL premix     1,000 mg 200 mL/hr over 60 Minutes Intravenous  Once 08/26/15 1316 08/26/15 1517   08/25/15 1600  fluconazole (DIFLUCAN) IVPB 200 mg     200 mg 100 mL/hr over 60 Minutes Intravenous Every 24 hours 08/25/15 1519     08/25/15 1045  ciprofloxacin (CIPRO) IVPB 400 mg  Status:  Discontinued     400 mg 200 mL/hr over 60 Minutes Intravenous Every 12 hours 08/25/15 1034 08/25/15 1043   08/25/15 1045  ciprofloxacin (CIPRO) IVPB 400 mg     400 mg 200 mL/hr over 60 Minutes Intravenous Once 08/25/15 1043 08/25/15 1145   08/25/15 1040  ciprofloxacin (CIPRO) 400 MG/200ML IVPB    Comments:  Darlin Drop: cabinet override      08/25/15 1040 08/25/15 1045   08/23/15 1800  cefTRIAXone (ROCEPHIN) 1 g in dextrose 5 % 50 mL IVPB     1 g 100 mL/hr over 30 Minutes Intravenous Every 24 hours 08/22/15 1752     08/22/15 1630  cefTRIAXone (ROCEPHIN) 1 g in  dextrose 5 % 50 mL IVPB  Status:  Discontinued     1 g 100 mL/hr over 30 Minutes Intravenous Every 24 hours 08/22/15 1624 08/22/15 1752   08/22/15 1630  azithromycin (ZITHROMAX) 500 mg in dextrose 5 % 250 mL IVPB     500 mg 250 mL/hr over 60 Minutes Intravenous Every 24 hours 08/22/15 1624     08/22/15 1600  cefTRIAXone (ROCEPHIN) 1 g in dextrose 5 % 50 mL IVPB     1 g 100 mL/hr over 30 Minutes Intravenous  Once 08/22/15 1553 08/22/15 1802      Current Facility-Administered Medications  Medication Dose Route Frequency Provider Last Rate Last Dose  . 0.9 %  sodium chloride infusion   Intravenous Continuous Markus Daft, MD 10 mL/hr at 08/25/15 1033    . acetaminophen (TYLENOL) tablet 650 mg  650 mg Oral Q6H PRN Theodoro Grist, MD       Or  . acetaminophen (TYLENOL) suppository 650 mg  650 mg Rectal Q6H PRN Theodoro Grist, MD      . albuterol (PROVENTIL) (2.5 MG/3ML) 0.083% nebulizer solution 2.5 mg  2.5 mg Nebulization Q6H PRN Theodoro Grist, MD   2.5 mg at 08/28/15 0804  . ALPRAZolam (XANAX) tablet 0.25 mg  0.25  mg Oral QHS PRN Theodoro Grist, MD   0.25 mg at 08/28/15 1118  . azithromycin (ZITHROMAX) 500 mg in dextrose 5 % 250 mL IVPB  500 mg Intravenous Q24H Theodoro Grist, MD   500 mg at 08/30/15 1800  . budesonide (PULMICORT) nebulizer solution 0.25 mg  0.25 mg Nebulization BID Loletha Grayer, MD   0.25 mg at 08/30/15 0740  . carvedilol (COREG) tablet 12.5 mg  12.5 mg Oral BID WC Demetrios Loll, MD   12.5 mg at 08/30/15 1713  . cefTRIAXone (ROCEPHIN) 1 g in dextrose 5 % 50 mL IVPB  1 g Intravenous Q24H Theodoro Grist, MD   1 g at 08/30/15 1714  . docusate sodium (COLACE) capsule 100 mg  100 mg Oral BID Theodoro Grist, MD   100 mg at 08/30/15 0846  . fluconazole (DIFLUCAN) IVPB 200 mg  200 mg Intravenous Q24H Lenis Noon, RPH   200 mg at 08/30/15 1713  . guaiFENesin-dextromethorphan (ROBITUSSIN DM) 100-10 MG/5ML syrup 5 mL  5 mL Oral Q8H PRN Loletha Grayer, MD   5 mL at 08/26/15 1417  . heparin  injection 5,000 Units  5,000 Units Subcutaneous Q12H Loletha Grayer, MD   5,000 Units at 08/30/15 [id number]  . HYDROcodone-acetaminophen (NORCO/VICODIN) 5-325 MG per tablet 1-2 tablet  1-2 tablet Oral Q4H PRN Theodoro Grist, MD   1 tablet at 08/23/15 0514  . iohexol (OMNIPAQUE) 300 MG/ML solution 50 mL  50 mL Other Once PRN Markus Daft, MD      . lactulose (CHRONULAC) 10 GM/15ML solution 20 g  20 g Oral BID PRN Demetrios Loll, MD      . lidocaine (PF) (XYLOCAINE) 1 % injection    PRN Markus Daft, MD   10 mL at 08/25/15 1127  . magnesium hydroxide (MILK OF MAGNESIA) suspension 30 mL  30 mL Oral Daily PRN Henreitta Leber, MD   30 mL at 08/29/15 0955  . nicotine polacrilex (NICORETTE) gum 2 mg  2 mg Oral PRN Theodoro Grist, MD      . nitroGLYCERIN (NITROSTAT) SL tablet 0.4 mg  0.4 mg Sublingual Q5 min PRN Theodoro Grist, MD      . ondansetron (ZOFRAN) tablet 4 mg  4 mg Oral Q6H PRN Theodoro Grist, MD       Or  . ondansetron (ZOFRAN) injection 4 mg  4 mg Intravenous Q6H PRN Theodoro Grist, MD   4 mg at 08/23/15 1422  . oxyCODONE-acetaminophen (PERCOCET/ROXICET) 5-325 MG per tablet 1-2 tablet  1-2 tablet Oral Q4H PRN Theodoro Grist, MD   2 tablet at 08/30/15 1719  . pantoprazole (PROTONIX) EC tablet 40 mg  40 mg Oral Daily Theodoro Grist, MD   40 mg at 08/30/15 0846  . polyethylene glycol (MIRALAX / GLYCOLAX) packet 17 g  17 g Oral Daily Loletha Grayer, MD   17 g at 08/30/15 0846  . predniSONE (DELTASONE) tablet 20 mg  20 mg Oral Q breakfast Demetrios Loll, MD   20 mg at 08/29/15 0930  . sodium chloride 0.9 % injection 3 mL  3 mL Intravenous Q12H Theodoro Grist, MD   3 mL at 08/30/15 1000  . tiotropium (SPIRIVA) inhalation capsule 18 mcg  18 mcg Inhalation Daily Theodoro Grist, MD   18 mcg at 08/30/15 0847     Objective: Vital signs in last 24 hours: Temp:  [97.6 F (36.4 C)-98.2 F (36.8 C)] 97.8 F (36.6 C) (12/19 1103) Pulse Rate:  [68-73] 68 (12/19 1103) Resp:  [18-20] 18 (12/19 1103)  BP: (102-131)/(59-78) 113/59  mmHg (12/19 1103) SpO2:  [95 %-98 %] 97 % (12/19 1103) Weight:  [201 lb (91.173 kg)] 201 lb (91.173 kg) (12/19 0443)  Intake/Output from previous day: 12/18 0701 - 12/19 0700 In: 480 [P.O.:480] Out: 4750 [Urine:3700; Drains:1050] Intake/Output this shift: Total I/O In: 580 [P.O.:480; IV Piggyback:100] Out: 1000 [Urine:1000]   Physical Exam Constitutional: Well nourished. Alert and oriented, No acute distress. HEENT: [place] AT, moist mucus membranes. Trachea midline, no masses. Cardiovascular: No clubbing, cyanosis.  Bilateral lower extremity edema noted. Respiratory: No respiratory distress, wearing 02.   GI: Abdomen is soft, non tender, non distended, no abdominal masses, acities appreciated.   GU: Rightnephrostomy tube in place draining clear urine.  Site is clean and dry and intact. Neurologic: Grossly intact, no focal deficits, moving all 4 extremities. Psychiatric: Normal mood and affect.  Lab Results:   Recent Labs  08/29/15 0803 08/30/15 0458  WBC 11.7* 15.1*  HGB 12.4* 12.8*  HCT 40.2 41.8  PLT 187 199   BMET  Recent Labs  08/29/15 0803 08/30/15 0458  NA 128* 130*  K 4.7 5.4*  CL 98* 99*  CO2 22 25  GLUCOSE 133* 132*  BUN 79* 75*  CREATININE 3.01* 2.77*  CALCIUM 8.4* 8.6*   No results found.    Assessment: 74 year old male with right retained ureteral stent, acute on chronic renal failure status post right percutaneous nephrostomy tube now with creatinine continuing to trend downwards.    1.  Acute on chronic renal failure status post right nephrostomy tube placement 2. Leukocytosis, trending up again but afebrile 3. Possible right pyelonephritis, on antibiotics and Diflucan, cultures growing yeast (Candida albicans) 4. Bilateral nephrolithiasis with right encrusted, retained ureteral stent-will ultimately need right ureteroscopy to remove right-sided stent and treat right-sided obstructing stones- currently with COPD, CHF exacerbation.    Plan: -  Continue to trend BMP - No additional surgical intervention for obstructing stones at this time, retained stent at this time given pyelonephrosis/ comorbid conditions -Outpatient follow up next week to discuss plans for stent removal - Continue antibiotics/ diflucan  Plan discussed in detail with patient and his wife this evening.    Will sign off, please contact with questions or concerns.     LOS: 8 days    Collin [name] 08/30/2015

## 2015-08-30 NOTE — Progress Notes (Signed)
[facility] [place] NAME: Collin [name]    MR#:  [id number]  DATE OF BIRTH:  25-Jul-1941  SUBJECTIVE:   Shortness of breath much improved, abdominal pain also improving. Cr. Improving and Urine output is good.    REVIEW OF SYSTEMS:    Review of Systems  Constitutional: Negative for fever and chills.  HENT: Negative for congestion and tinnitus.   Eyes: Negative for blurred vision and double vision.  Respiratory: Negative for cough, shortness of breath and wheezing.   Cardiovascular: Negative for chest pain, orthopnea and PND.  Gastrointestinal: Positive for abdominal pain (right flank pain). Negative for nausea, vomiting and diarrhea.  Genitourinary: Negative for dysuria and hematuria.  Neurological: Positive for weakness (generalized). Negative for dizziness, sensory change and focal weakness.  All other systems reviewed and are negative.   Nutrition: Regular soft Tolerating Diet: Yes Tolerating PT: Evaluation noted  DRUG ALLERGIES:   Allergies  Allergen Reactions  . Lisinopril Swelling    Pt reports throat swelling.  . Metoprolol     Other reaction(s): Other (See Comments) Caused ringing in ears    VITALS:  Blood pressure 113/59, pulse 68, temperature 97.8 F (36.6 C), temperature source Oral, resp. rate 18, height 6\' 2"  (1.88 m), weight 91.173 kg (201 lb), SpO2 97 %.  PHYSICAL EXAMINATION:   Physical Exam  GENERAL:  74 y.o.-year-old patient sitting up in bed in no acute distress.  EYES: Pupils equal, round, reactive to light and accommodation. No scleral icterus. Extraocular muscles intact.  HEENT: Head atraumatic, normocephalic. Oropharynx and nasopharynx clear.  NECK:  Supple, no jugular venous distention. No thyroid enlargement, no tenderness.  LUNGS: Prolonged inspiratory and expiratory phase, no wheezing, rales, rhonchi. No use of accessory muscles of respiration.  CARDIOVASCULAR: S1, S2 normal. No murmurs,  rubs, or gallops.  ABDOMEN: Soft, tender in the right flank area, no rebound, rigidity, nondistended. Bowel sounds present. No organomegaly or mass. Positive right-sided nephrostomy tube with clear urine draining. EXTREMITIES: No cyanosis, clubbing, +1-2 pitting edema bilaterally   NEUROLOGIC: Cranial nerves II through XII are intact. No focal Motor or sensory deficits b/l.   PSYCHIATRIC: The patient is alert and oriented x 3.  SKIN: No obvious rash, lesion, or ulcer.    LABORATORY PANEL:   CBC  Recent Labs Lab 08/30/15 0458  WBC 15.1*  HGB 12.8*  HCT 41.8  PLT 199   ------------------------------------------------------------------------------------------------------------------  Chemistries   Recent Labs Lab 08/30/15 0458  NA 130*  K 5.4*  CL 99*  CO2 25  GLUCOSE 132*  BUN 75*  CREATININE 2.77*  CALCIUM 8.6*   ------------------------------------------------------------------------------------------------------------------  Cardiac Enzymes No results for input(s): TROPONINI in the last 168 hours. ------------------------------------------------------------------------------------------------------------------  RADIOLOGY:  No results found.   ASSESSMENT AND PLAN:   74 year old male with past medical history of COPD, chronic systolic CHF, nephrolithiasis, hyperlipidemia, hypertension, GERD presented to the hospital due to shortness of breath and also noted to have nephrolithiasis.  #1 COPD exacerbation-clinically much improved since admission. -Continue prednisone taper, continue budesonide nebs, albuterol nebulizers, continue Spiriva -Continue ceftriaxone, Zithromax and will Switch to oral Levaquin upon discharge tomorrow.  #2 chronic systolic CHF-clinically patient is not in congestive heart failure. -Continue Coreg, hold Lasix given renal failure.  #3 acute on chronic renal failure-this is likely secondary to underlying urinary retention and  nephrolithiasis. Patient is status post Foley catheter placement, and also percutaneous right-sided nephrostomy tube placement. -Cr. Improving and urine output is good.  Appreciate  nephrology input and no need for acute dialysis. Follow BUN and creatinine renal dose meds and avoid nephrotoxins.  #4 pyelonephritis with yeast-continue Diflucan.  #5 elevated troponin-likely the setting of demand ischemia. No evidence of acute coronary syndrome. No chest pain.  #6 anxiety-continue Xanax.  #7 GERD-continue Protonix.  #8 tobacco abuse-continue Nicorette gum.  Likely DC home tomorrow with home health services.   All the records are reviewed and case discussed with Care Management/Social Workerr. Management plans discussed with the patient, family and they are in agreement.  CODE STATUS: Full  DVT Prophylaxis: Heparin subcutaneous  TOTAL TIME TAKING CARE OF THIS PATIENT: 30 minutes.   POSSIBLE D/C IN 1-2 DAYS, DEPENDING ON CLINICAL CONDITION.   Henreitta Leber M.D on 08/30/2015 at 3:27 PM  Between 7am to 6pm - Pager - [phone]  After 6pm go to [email or web address] - password [email or web address] [facility]  Office  [phone]  CC: Primary care physician; [name], MD

## 2015-08-30 NOTE — Progress Notes (Signed)
[facility]  ROUNDING NOTE   Subjective:   Patient is doing better overall UOP > 4500 cc S Cr is decreasing slowly No SOB Still has large amount of edema   Objective:  Vital signs in last 24 hours:  Temp:  [97.6 F (36.4 C)-98.2 F (36.8 C)] 97.8 F (36.6 C) (12/19 1103) Pulse Rate:  [68-73] 68 (12/19 1103) Resp:  [18-20] 18 (12/19 1103) BP: (102-131)/(59-78) 113/59 mmHg (12/19 1103) SpO2:  [95 %-98 %] 97 % (12/19 1103) Weight:  [91.173 kg (201 lb)] 91.173 kg (201 lb) (12/19 0443)  Weight change: 0.726 kg (1 lb 9.6 oz) Filed Weights   08/28/15 0613 08/29/15 0500 08/30/15 0443  Weight: 93.985 kg (207 lb 3.2 oz) 90.447 kg (199 lb 6.4 oz) 91.173 kg (201 lb)    Intake/Output: I/O last 3 completed shifts: In: 1080 [P.O.:480; IV Piggyback:600] Out: 6440 [Urine:5800; Drains:1050]   Intake/Output this shift:  Total I/O In: 480 [P.O.:480] Out: 0   Physical Exam: General: NAD  Head: Normocephalic, atraumatic. Moist oral mucosal membranes  Eyes: Anicteric  Neck: Supple, trachea midline  Lungs:  Basilar rales, normal effort, right pleurex in place  Heart: S1S2 no rubs  Abdomen:  Soft, nontender, BS present, nephrostomy in place, distended  Extremities: +++ peripheral edema.  Neurologic: Nonfocal, moving all four extremities  Skin: No lesions       Basic Metabolic Panel:  Recent Labs Lab 08/25/15 0519 08/26/15 0840 08/27/15 0515 08/28/15 0416 08/29/15 0803 08/30/15 0458  NA 127* 131*  --  129* 128* 130*  K 4.0 4.4  --  4.9 4.7 5.4*  CL 96* 97*  --  96* 98* 99*  CO2 20* 20*  --  '23 22 25  '$ GLUCOSE 117* 205*  --  128* 133* 132*  BUN 76* 83*  --  85* 79* 75*  CREATININE 4.99* 5.14* 4.55* 3.73* 3.01* 2.77*  CALCIUM 7.9* 8.4*  --  8.4* 8.4* 8.6*  PHOS  --   --   --  6.0*  --  3.7    Liver Function Tests:  Recent Labs Lab 08/28/15 0416 08/30/15 0458  ALBUMIN 2.7* 3.0*   No results for input(s): LIPASE, AMYLASE in the last 168 hours. No  results for input(s): AMMONIA in the last 168 hours.  CBC:  Recent Labs Lab 08/25/15 0519 08/26/15 0840 08/28/15 0416 08/29/15 0803 08/30/15 0458  WBC 15.3* 13.5* 13.2* 11.7* 15.1*  HGB 11.4* 11.2* 11.9* 12.4* 12.8*  HCT 36.9* 36.3* 39.4* 40.2 41.8  MCV 74.0* 74.0* 74.1* 73.6* 74.6*  PLT 184 171 175 187 199    Cardiac Enzymes: No results for input(s): CKTOTAL, CKMB, CKMBINDEX, TROPONINI in the last 168 hours.  BNP: Invalid input(s): POCBNP  CBG: No results for input(s): GLUCAP in the last 168 hours.  Microbiology: Results for orders placed or performed during the hospital encounter of 08/22/15  Blood culture (routine x 2)     Status: None   Collection Time: 08/22/15  4:39 PM  Result Value Ref Range Status   Specimen Description BLOOD RIGHT FOREARM  Final   Special Requests   Final    BOTTLES DRAWN AEROBIC AND ANAEROBIC  AER [place] ANA 2CC   Culture NO GROWTH 5 DAYS  Final   Report Status 08/27/2015 FINAL  Final  Blood culture (routine x 2)     Status: None   Collection Time: 08/22/15  4:40 PM  Result Value Ref Range Status   Specimen Description BLOOD LEFT ARM  Final  Special Requests BOTTLES DRAWN AEROBIC AND ANAEROBIC  1CC  Final   Culture NO GROWTH 5 DAYS  Final   Report Status 08/27/2015 FINAL  Final  Urine culture     Status: None   Collection Time: 08/23/15  3:55 PM  Result Value Ref Range Status   Specimen Description URINE, RANDOM  Final   Special Requests NONE  Final   Culture 80,000 COLONIES/ml CANDIDA ALBICANS  Final   Report Status 08/27/2015 FINAL  Final  Body fluid culture     Status: None   Collection Time: 08/25/15 11:00 AM  Result Value Ref Range Status   Specimen Description FLUID  Final   Special Requests NONE  Final   Gram Stain TOO NUMEROUS TO COUNT WBC SEEN FEW YEAST   Final   Culture HEAVY GROWTH CANDIDA SPECIES  Final   Report Status 08/28/2015 FINAL  Final  Culture, expectorated sputum-assessment     Status: None   Collection Time:  08/27/15  2:37 PM  Result Value Ref Range Status   Specimen Description SPUTUM  Final   Special Requests Immunocompromised  Final   Sputum evaluation THIS SPECIMEN IS ACCEPTABLE FOR SPUTUM CULTURE  Final   Report Status 08/27/2015 FINAL  Final  Culture, respiratory (NON-Expectorated)     Status: None   Collection Time: 08/27/15  2:37 PM  Result Value Ref Range Status   Specimen Description SPUTUM  Final   Special Requests Immunocompromised Reflexed from [id number]  Final   Gram Stain   Final    FEW WBC SEEN NO ORGANISMS SEEN FAIR SPECIMEN - 70-80% WBCS    Culture Consistent with normal respiratory flora.  Final   Report Status 08/29/2015 FINAL  Final    Coagulation Studies: No results for input(s): LABPROT, INR in the last 72 hours.  Urinalysis: No results for input(s): COLORURINE, LABSPEC, PHURINE, GLUCOSEU, HGBUR, BILIRUBINUR, KETONESUR, PROTEINUR, UROBILINOGEN, NITRITE, LEUKOCYTESUR in the last 72 hours.  Invalid input(s): APPERANCEUR    Imaging: No results found.   Medications:   . sodium chloride 10 mL/hr at 08/25/15 1033   . azithromycin  500 mg Intravenous Q24H  . budesonide (PULMICORT) nebulizer solution  0.25 mg Nebulization BID  . carvedilol  12.5 mg Oral BID WC  . cefTRIAXone (ROCEPHIN)  IV  1 g Intravenous Q24H  . docusate sodium  100 mg Oral BID  . fluconazole (DIFLUCAN) IV  200 mg Intravenous Q24H  . heparin subcutaneous  5,000 Units Subcutaneous Q12H  . pantoprazole  40 mg Oral Daily  . polyethylene glycol  17 g Oral Daily  . predniSONE  20 mg Oral Q breakfast  . sodium chloride  3 mL Intravenous Q12H  . sodium chloride  1 g Oral BID WC  . tiotropium  18 mcg Inhalation Daily   acetaminophen **OR** acetaminophen, albuterol, ALPRAZolam, guaiFENesin-dextromethorphan, HYDROcodone-acetaminophen, iohexol, lactulose, lidocaine (PF), magnesium hydroxide, nicotine polacrilex, nitroGLYCERIN, ondansetron **OR** ondansetron (ZOFRAN) IV,  oxyCODONE-acetaminophen  Assessment/ Plan:  74 y.o.white male with hypertension, nephrolithiasis, COPD, GERD, anxiety, AAA repair, recurrent right pleural effusions  1.  Acute renal failure on CKD stage III: baseline creatinine 1.45, EGFR 48 from 11/1. With nephrotic range proteinuria.  - S Creatinine is improving slowly  2.  Hydronephrosis could certainly be resulting in some acute renal failure now though we suspect that the hydronephrosis has been quite chronic in nature. Percutaneous nephrostomy placed 08/25/15. -  Renal function is improving. no indication for HD at the moment, will continue to monitor outpt from the nephrostomy and  follow renal function.   2.  Generalized edema - with 3rd spacing of fluid - brisk UOP at present  - continue to monitor  3. Secondary hyperparathyroidism: PTH 54 as outpatient. Calcium and phosphorus at goal.   4. Hyponatremia: likely due to CHF - low salt diet - expected to improve with renal function    LOS: 8 Camara Renstrom 12/19/20162:23 PM

## 2015-08-30 NOTE — Progress Notes (Signed)
Report called to [place] on 1A. MD Verdell Carmine was notified of abnormal values and pt status. No further orders.

## 2015-08-30 NOTE — Progress Notes (Signed)
Pt refused to be moved to 1A.

## 2015-08-31 LAB — BASIC METABOLIC PANEL
Anion gap: 8 (ref 5–15)
BUN: 67 mg/dL — AB (ref 6–20)
CHLORIDE: 102 mmol/L (ref 101–111)
CO2: 22 mmol/L (ref 22–32)
CREATININE: 2.47 mg/dL — AB (ref 0.61–1.24)
Calcium: 8.3 mg/dL — ABNORMAL LOW (ref 8.9–10.3)
GFR, EST AFRICAN AMERICAN: 28 mL/min — AB (ref >60)
GFR, EST NON AFRICAN AMERICAN: 24 mL/min — AB (ref >60)
Glucose, Bld: 145 mg/dL — ABNORMAL HIGH (ref 65–99)
POTASSIUM: 5.3 mmol/L — AB (ref 3.5–5.1)
SODIUM: 132 mmol/L — AB (ref 135–145)

## 2015-08-31 MED ORDER — OXYCODONE-ACETAMINOPHEN 5-325 MG PO TABS: 1.0000 | ORAL_TABLET | ORAL | Status: DC | PRN

## 2015-08-31 MED ORDER — LEVOFLOXACIN 250 MG PO TABS: 250.0000 mg | ORAL_TABLET | ORAL | Status: DC

## 2015-08-31 MED ORDER — FUROSEMIDE 40 MG PO TABS: 40.0000 mg | ORAL_TABLET | Freq: Every day | ORAL | Status: DC

## 2015-08-31 MED ORDER — PREDNISONE 10 MG PO TABS: ORAL_TABLET | ORAL | Status: DC

## 2015-08-31 NOTE — Discharge Summary (Signed)
[facility] [place] NAME: Collin [name]    MR#:  [id number]  DATE OF BIRTH:  08/28/1941  DATE OF ADMISSION:  08/22/2015 ADMITTING PHYSICIAN: Theodoro Grist, MD  DATE OF DISCHARGE: 08/31/2015  1:00 PM  PRIMARY CARE PHYSICIAN: [name], MD    ADMISSION DIAGNOSIS:  Shortness of breath [R06.02] Acute pulmonary edema (HCC) [J81.0] UTI (lower urinary tract infection) [N39.0] Elevated troponin [R79.89]  DISCHARGE DIAGNOSIS:  Active Problems:   Acute congestive heart failure (HCC)   UTI (lower urinary tract infection)   Hydronephrosis with urinary obstruction due to renal calculus   ARF (acute renal failure) (HCC)   Hydronephrosis   SECONDARY DIAGNOSIS:   Past Medical History  Diagnosis Date  . COPD (chronic obstructive pulmonary disease) ([place])   . Chronic systolic CHF (congestive heart failure) ([place])   . Kidney stone   . Macular degeneration   . HLD (hyperlipidemia)   . Hypertensive retinopathy   . HTN (hypertension)   . GERD (gastroesophageal reflux disease)     HOSPITAL COURSE:   74 year old male with past medical history of COPD, chronic systolic CHF, nephrolithiasis, hyperlipidemia, hypertension, GERD presented to the hospital due to shortness of breath and also noted to have nephrolithiasis.  #1 COPD exacerbation-this is likely due to underlying bronchitis. Patient was treated aggressively with IV steroids and then weaned off on oral prednisone is being discharged on that. -Patient was also given budesonide nebulizers. For now he will continue prednisone taper, Levaquin and continue his inhalers as stated below.  #2 chronic systolic CHF-clinically patient is not in congestive heart failure while in the hospital.  -Continue Coreg, and pt. Will resume his Lasix upon discharge today.   #3 acute on chronic renal failure-this is likely secondary to underlying urinary retention and nephrolithiasis. -Patient was  seen by urology and he underwent a percutaneous right-sided nephrostomy tube by interventional radiology. His creatinine has started to trend down and is improving. His urine output is good. -He will need close follow-up with urology and also nephrology as an outpatient. -He will need his stent removed by urology in the next few weeks. -he is being discharged with home health nursing services to help with his nephrostomy tube care.  #4 pyelonephritis with yeast-patient has clinically improved. Discussed with urology and this is at least collection around his stent and not really clinically significant for treatment. He was given some IV Diflucan but is not being discharged on any oral antifungals presently.  #5 elevated troponin-this was likely in the setting of demand ischemia. No evidence of acute coronary syndrome. Pt. Had No chest pain.  #6 anxiety- He will continue Xanax.  #7 GERD- he will continue Protonix.   DISCHARGE CONDITIONS:   Stable.   CONSULTS OBTAINED:  Treatment Team:  Teodoro Spray, MD Anthonette Legato, MD Hollice Espy, MD  DRUG ALLERGIES:   Allergies  Allergen Reactions  . Lisinopril Swelling    Pt reports throat swelling.  . Metoprolol     Other reaction(s): Other (See Comments) Caused ringing in ears    DISCHARGE MEDICATIONS:   Discharge Medication List as of 08/31/2015 12:55 PM    START taking these medications   Details  levofloxacin (LEVAQUIN) 250 MG tablet Take 1 tablet (250 mg total) by mouth every other day., Starting 08/31/2015, Until Discontinued, Print    predniSONE (DELTASONE) 10 MG tablet Label  & dispense according to the schedule below. 3 Pills PO for 1 day, 2 Pills PO  for 1 day, 1 Pill PO for 1 days then STOP., Print      CONTINUE these medications which have CHANGED   Details  furosemide (LASIX) 40 MG tablet Take 1 tablet (40 mg total) by mouth daily., Starting 08/31/2015, Until Discontinued, Print    oxyCODONE-acetaminophen  (PERCOCET/ROXICET) 5-325 MG tablet Take 1-2 tablets by mouth every 4 (four) hours as needed for moderate pain., Starting 08/31/2015, Until Discontinued, Print      CONTINUE these medications which have NOT CHANGED   Details  ADVAIR DISKUS 100-50 MCG/DOSE AEPB Inhale 1 puff into the lungs 2 (two) times daily., Starting 07/30/2015, Until Discontinued, Historical Med    albuterol (PROVENTIL HFA;VENTOLIN HFA) 108 (90 BASE) MCG/ACT inhaler Inhale 2 puffs into the lungs every 6 (six) hours as needed for wheezing or shortness of breath. , Until Discontinued, Historical Med    ALPRAZolam (XANAX) 0.25 MG tablet Take 0.25 mg by mouth at bedtime as needed. For sleep, Until Discontinued, Historical Med    aspirin 81 MG tablet Take 81 mg by mouth daily as needed. , Until Discontinued, Historical Med    carvedilol (COREG) 6.25 MG tablet Take 6.25 mg by mouth 2 (two) times daily with a meal. , Starting 06/28/2015, Until Tue 06/27/16, Historical Med    Multiple Vitamin (MULTIVITAMIN) tablet Take 1 tablet by mouth daily., Until Discontinued, Historical Med    nitroGLYCERIN (NITROSTAT) 0.4 MG SL tablet Take 0.4 mg by mouth every 5 (five) minutes x 3 doses as needed. For chest pain. If no relief call MD or go to emergency room., Starting 10/25/2012, Until Discontinued, Historical Med    omeprazole (PRILOSEC) 20 MG capsule Take 20 mg by mouth 2 (two) times daily before a meal., Until Discontinued, Historical Med    tiotropium (SPIRIVA HANDIHALER) 18 MCG inhalation capsule Place 1 capsule (18 mcg total) into inhaler and inhale daily., Starting 03/06/2015, Until Discontinued, Print         DISCHARGE INSTRUCTIONS:   DIET:  Cardiac diet  DISCHARGE CONDITION:  Stable  ACTIVITY:  Activity as tolerated  OXYGEN:  Home Oxygen: Yes.     Oxygen Delivery: 3 liters/min via Patient connected to nasal cannula oxygen  DISCHARGE LOCATION:  home   If you experience worsening of your admission symptoms, develop  shortness of breath, life threatening emergency, suicidal or homicidal thoughts you must seek medical attention immediately by calling 911 or calling your MD immediately  if symptoms less severe.  You Must read complete instructions/literature along with all the possible adverse reactions/side effects for all the Medicines you take and that have been prescribed to you. Take any new Medicines after you have completely understood and accpet all the possible adverse reactions/side effects.   Please note  You were cared for by a hospitalist during your hospital stay. If you have any questions about your discharge medications or the care you received while you were in the hospital after you are discharged, you can call the unit and asked to speak with the hospitalist on call if the hospitalist that took care of you is not available. Once you are discharged, your primary care physician will handle any further medical issues. Please note that NO REFILLS for any discharge medications will be authorized once you are discharged, as it is imperative that you return to your primary care physician (or establish a relationship with a primary care physician if you do not have one) for your aftercare needs so that they can reassess your need for medications and  monitor your lab values.     Today   Patient's shortness of breath is improved. Still has some right flank pain but improved. No nausea, vomiting.  VITAL SIGNS:  Blood pressure 118/79, pulse 73, temperature 97.9 F (36.6 C), temperature source Axillary, resp. rate 18, height 6\' 2"  (1.88 m), weight 96.525 kg (212 lb 12.8 oz), SpO2 95 %.  I/O:   Intake/Output Summary (Last 24 hours) at 08/31/15 1621 Last data filed at 08/31/15 0926  Gross per 24 hour  Intake    580 ml  Output   2200 ml  Net  -1620 ml    PHYSICAL EXAMINATION:   GENERAL: 74 y.o.-year-old patient sitting up in bed in no acute distress.  EYES: Pupils equal, round, reactive to  light and accommodation. No scleral icterus. Extraocular muscles intact.  HEENT: Head atraumatic, normocephalic. Oropharynx and nasopharynx clear.  NECK: Supple, no jugular venous distention. No thyroid enlargement, no tenderness.  LUNGS: Prolonged inspiratory and expiratory phase, no wheezing, rales, rhonchi. No use of accessory muscles of respiration.  CARDIOVASCULAR: S1, S2 normal. No murmurs, rubs, or gallops.  ABDOMEN: Soft, tender in the right flank area, no rebound, rigidity, nondistended. Bowel sounds present. No organomegaly or mass. Positive right-sided nephrostomy tube with clear urine draining. EXTREMITIES: No cyanosis, clubbing, +1-2 pitting edema bilaterally  NEUROLOGIC: Cranial nerves II through XII are intact. No focal Motor or sensory deficits b/l.  PSYCHIATRIC: The patient is alert and oriented x 3.  SKIN: No obvious rash, lesion, or ulcer.   DATA REVIEW:   CBC  Recent Labs Lab 08/30/15 0458  WBC 15.1*  HGB 12.8*  HCT 41.8  PLT 199    Chemistries   Recent Labs Lab 08/31/15 0604  NA 132*  K 5.3*  CL 102  CO2 22  GLUCOSE 145*  BUN 67*  CREATININE 2.47*  CALCIUM 8.3*    Cardiac Enzymes No results for input(s): TROPONINI in the last 168 hours.    RADIOLOGY:  No results found.    Management plans discussed with the patient, family and they are in agreement.  CODE STATUS:     Code Status Orders        Start     Ordered   08/22/15 1745  Full code   Continuous     08/22/15 1744    Advance Directive Documentation        Most Recent Value   Type of Advance Directive  Living will   Pre-existing out of facility DNR order (yellow form or pink MOST form)     "MOST" Form in Place?        TOTAL TIME TAKING CARE OF THIS PATIENT: 40 minutes.    Henreitta Leber M.D on 08/31/2015 at 4:21 PM  Between 7am to 6pm - Pager - [phone]  After 6pm go to [email or web address] - password [email or web address]  Office   [phone]  CC: Primary care physician; [name], MD

## 2015-08-31 NOTE — Care Management (Signed)
Patient to discharge today to home.   MD has ordered to resume home health and added PT.  Face to face has been completed.  Corene Cornea from [facility] notified of discharge

## 2015-09-02 DIAGNOSIS — I129 Hypertensive chronic kidney disease with stage 1 through stage 4 chronic kidney disease, or unspecified chronic kidney disease: Secondary | ICD-10-CM | POA: Diagnosis not present

## 2015-09-02 DIAGNOSIS — J90 Pleural effusion, not elsewhere classified: Secondary | ICD-10-CM | POA: Diagnosis not present

## 2015-09-02 DIAGNOSIS — N184 Chronic kidney disease, stage 4 (severe): Secondary | ICD-10-CM | POA: Diagnosis not present

## 2015-09-02 DIAGNOSIS — J440 Chronic obstructive pulmonary disease with acute lower respiratory infection: Secondary | ICD-10-CM | POA: Diagnosis not present

## 2015-09-02 DIAGNOSIS — Z438 Encounter for attention to other artificial openings: Secondary | ICD-10-CM | POA: Diagnosis not present

## 2015-09-02 DIAGNOSIS — I5022 Chronic systolic (congestive) heart failure: Secondary | ICD-10-CM | POA: Diagnosis not present

## 2015-09-07 DIAGNOSIS — N179 Acute kidney failure, unspecified: Secondary | ICD-10-CM | POA: Diagnosis not present

## 2015-09-07 DIAGNOSIS — R809 Proteinuria, unspecified: Secondary | ICD-10-CM | POA: Diagnosis not present

## 2015-09-07 DIAGNOSIS — I129 Hypertensive chronic kidney disease with stage 1 through stage 4 chronic kidney disease, or unspecified chronic kidney disease: Secondary | ICD-10-CM | POA: Diagnosis not present

## 2015-09-07 DIAGNOSIS — N183 Chronic kidney disease, stage 3 (moderate): Secondary | ICD-10-CM | POA: Diagnosis not present

## 2015-09-07 DIAGNOSIS — N390 Urinary tract infection, site not specified: Secondary | ICD-10-CM | POA: Diagnosis not present

## 2015-09-15 ENCOUNTER — Ambulatory Visit: Payer: Medicare Other | Admitting: Urology

## 2015-09-15 ENCOUNTER — Ambulatory Visit: Payer: Medicare Other | Admitting: Family

## 2015-09-17 ENCOUNTER — Ambulatory Visit (INDEPENDENT_AMBULATORY_CARE_PROVIDER_SITE_OTHER): Payer: Medicare Other | Admitting: Urology

## 2015-09-17 VITALS — BP 146/93 | HR 99 | Ht 74.0 in | Wt 197.0 lb

## 2015-09-17 DIAGNOSIS — Z960 Presence of urogenital implants: Secondary | ICD-10-CM | POA: Diagnosis not present

## 2015-09-17 DIAGNOSIS — N179 Acute kidney failure, unspecified: Secondary | ICD-10-CM | POA: Diagnosis not present

## 2015-09-17 DIAGNOSIS — B3749 Other urogenital candidiasis: Secondary | ICD-10-CM

## 2015-09-17 DIAGNOSIS — N189 Chronic kidney disease, unspecified: Secondary | ICD-10-CM | POA: Diagnosis not present

## 2015-09-17 DIAGNOSIS — N132 Hydronephrosis with renal and ureteral calculous obstruction: Secondary | ICD-10-CM

## 2015-09-17 MED ORDER — OXYCODONE-ACETAMINOPHEN 5-325 MG PO TABS: 1.0000 | ORAL_TABLET | ORAL | Status: DC | PRN

## 2015-09-17 NOTE — Progress Notes (Signed)
09/17/2015 1:10 PM   Collin [name] 09/13/1940 [id number]  Referring provider: [name], MD [facility] [place],  [address]  Chief Complaint  Patient presents with  . Nephrolithiasis    New Patient    HPI: 75 yo male with significant comorbidities including COPD, chronic pleural effusion with Pleurx catheter, CHF, and acute on chronic renal failure who was recently admitted with severe worsening of his renal function found to have a retained right ureteral stent, significant progression of his bilateral nephrolithiasis, and right hydronephrosis consistent with an obstructed nonfunctioning stent. During that admission, he underwent placement of a right percutaneous nephrostomy tube to decompress his right collecting system and ultimately his renal function improved voiding need for dialysis and he was also treated for right pyelonephritis.  He returns today to discuss definitive management for his encrusted ureteral stent and right-sided stone burden. His stent was initially placed up proximally one year ago during an admission with similar presentation of acute on chronic renal failure necessitating temporary dialysis. A right ureteral stent was placed at the time and his renal function improved. Unfortunately, he had no follow-up in the interim he's had significant stent encrustation along with progression of his stone burden.  He's been extremely ill over the past year and has multiple medical comorbidities. He is oxygen dependent and followed by Dr. Ubaldo Glassing and Dr. Raul Del for his pulmonary and cardiac issues. He's also been seeing Dr. Genevive Bi with consideration for right pleurodesis.  Today, he reports that he has had a cold for the cough and postnasal drip since his discharge. He is worried that he is developing pneumonia.  No fevers or chills. His right nephrostomy tube has been draining well but is somewhat cloudy in appearance consistent with his  history of candidal colonization.  PMH: Past Medical History  Diagnosis Date  . COPD (chronic obstructive pulmonary disease) ([place])   . Chronic systolic CHF (congestive heart failure) ([place])   . Kidney stone   . Macular degeneration   . HLD (hyperlipidemia)   . Hypertensive retinopathy   . HTN (hypertension)   . GERD (gastroesophageal reflux disease)     Surgical History: Past Surgical History  Procedure Laterality Date  . Abdominal aortic aneurysm repair    . Hernia repair    . Kidney stone surgery    . Chest tube insertion Right 07/07/2015    Procedure: INSERTION PLEURAL DRAINAGE CATHETER;  Surgeon: Nestor Lewandowsky, MD;  Location: [facility] ORS;  Service: Thoracic;  Laterality: Right;    Home Medications:    Medication List       This list is accurate as of: 09/17/15 11:59 PM.  Always use your most recent med list.               ADVAIR DISKUS 100-50 MCG/DOSE Aepb  Generic drug:  Fluticasone-Salmeterol  Inhale 1 puff into the lungs 2 (two) times daily.     albuterol 108 (90 Base) MCG/ACT inhaler  Commonly known as:  PROVENTIL HFA;VENTOLIN HFA  Inhale 2 puffs into the lungs every 6 (six) hours as needed for wheezing or shortness of breath.     ALPRAZolam 0.25 MG tablet  Commonly known as:  XANAX  Take 0.25 mg by mouth at bedtime as needed. For sleep     aspirin 81 MG tablet  Take 81 mg by mouth daily as needed.     carvedilol 6.25 MG tablet  Commonly known as:  COREG  Take 6.25 mg by mouth  2 (two) times daily with a meal.     furosemide 40 MG tablet  Commonly known as:  LASIX  Take 1 tablet (40 mg total) by mouth daily.     multivitamin tablet  Take 1 tablet by mouth daily.     NITROSTAT 0.4 MG SL tablet  Generic drug:  nitroGLYCERIN  Take 0.4 mg by mouth every 5 (five) minutes x 3 doses as needed. Reported on 09/17/2015     omeprazole 20 MG capsule  Commonly known as:  PRILOSEC  Take 20 mg by mouth 2 (two) times daily before a meal.     oxyCODONE-acetaminophen  5-325 MG tablet  Commonly known as:  PERCOCET/ROXICET  Take 1-2 tablets by mouth every 4 (four) hours as needed for moderate pain.     tiotropium 18 MCG inhalation capsule  Commonly known as:  SPIRIVA HANDIHALER  Place 1 capsule (18 mcg total) into inhaler and inhale daily.        Allergies:  Allergies  Allergen Reactions  . Lisinopril Swelling    Pt reports throat swelling.  . Metoprolol     Other reaction(s): Other (See Comments) Caused ringing in ears    Family History: Family History  Problem Relation Age of Onset  . Heart attack Mother   . Stroke Mother   . Cancer Father   . COPD Father   . Heart attack Father     Social History:  reports that he has been smoking Cigarettes.  He has a 11.25 pack-year smoking history. He has never used smokeless tobacco. He reports that he does not drink alcohol or use illicit drugs.  ROS: UROLOGY Frequent Urination?: No Hard to postpone urination?: No Burning/pain with urination?: Yes Get up at night to urinate?: Yes Leakage of urine?: No Urine stream starts and stops?: No Trouble starting stream?: No Do you have to strain to urinate?: No Blood in urine?: No Urinary tract infection?: No Sexually transmitted disease?: No Injury to kidneys or bladder?: No Painful intercourse?: No Weak stream?: Yes Erection problems?: No Penile pain?: No  Gastrointestinal Nausea?: Yes Vomiting?: No Indigestion/heartburn?: Yes Diarrhea?: No Constipation?: No  Constitutional Fever: No Night sweats?: No Weight loss?: No Fatigue?: Yes  Skin Skin rash/lesions?: No Itching?: No  Eyes Blurred vision?: No Double vision?: No  Ears/Nose/Throat Sore throat?: No Sinus problems?: No  Hematologic/Lymphatic Swollen glands?: No Easy bruising?: No  Cardiovascular Leg swelling?: No Chest pain?: No  Respiratory Cough?: Yes Shortness of breath?: Yes  Endocrine Excessive thirst?: Yes  Musculoskeletal Back pain?: Yes Joint  pain?: Yes  Neurological Headaches?: Yes Dizziness?: No  Psychologic Depression?: No Anxiety?: Yes  Physical Exam: BP 146/93 mmHg  Pulse 99  Ht 6\' 2"  (1.88 m)  Wt 197 lb (89.359 kg)  BMI 25.28 kg/m2 O2 sat 95% on room air. Constitutional:  Alert and oriented, No acute distress.  Sitting in wheelchair. Generally unwell appearing. Wife is present with him today. HEENT: [place] AT, moist mucus membranes.  Trachea midline, no masses. Cardiovascular: No clubbing, cyanosis.  1+ lower extremity pitting edema bilaterally. Respiratory: Normal respiratory effort, no increased work of breathing.  Right Pleurx catheter in place, dressing overlying. GI: Abdomen is soft, nontender, nondistended, no abdominal masses. GU: No CVA tenderness. Right nephrostomy tube draining slightly cloudy urine. Site clean dry and intact. Skin: No rashes, bruises or suspicious lesions.. Neurologic: Grossly intact, no focal deficits, moving all 4 extremities. Psychiatric: Normal mood and affect.  Laboratory Data: Lab Results  Component Value Date  WBC 15.1* 08/30/2015   HGB 12.8* 08/30/2015   HCT 41.8 08/30/2015   MCV 74.6* 08/30/2015   PLT 199 08/30/2015    Lab Results  Component Value Date   CREATININE 2.47* 08/31/2015    Lab Results  Component Value Date   HGBA1C 6.2* 08/22/2015    Pertinent Imaging: CT stone CLINICAL DATA: Right lower quadrant pain x2 days, recurrent UTIs, renal stones, and renal failure  EXAM: CT ABDOMEN AND PELVIS WITHOUT CONTRAST  TECHNIQUE: Multidetector CT imaging of the abdomen and pelvis was performed following the standard protocol without IV contrast.  COMPARISON: 07/11/2014  FINDINGS: Lower chest: Moderate to large right pleural effusion, incompletely visualized. Associated compressive atelectasis in the right lower lobe. Trace left pleural effusion.  Hepatobiliary: Liver is grossly unremarkable.  Gallbladder is notable for layering sludge and/or  noncalcified gallstones (series 2/image 30). No associated inflammatory changes. No intrahepatic or extrahepatic ductal dilatation.  Pancreas: Pancreatic atrophy.  Spleen: Within normal limits.  Adrenals/Urinary Tract: Adrenal glands are unremarkable.  Left kidney is unremarkable. Fullness of the left renal collecting system. 14 x 6 mm ovoid calculus in the left proximal collecting system (series 2/ image 26). Additional 9 x 9 mm irregular calculus in the left proximal collecting system (series 2/ image 28). Surrounding urothelial thickening, reflecting chronic inflammation.  Right kidney is notable for perirenal edema. 2.0 x 1.1 cm ovoid calculus in the right upper pole calyx (series 2/ image 36). Moderate right hydronephrosis with indwelling double-pigtail ureteral catheter. Additional 12 x 9 mm ovoid calculus in the right proximal ureter adjacent to the catheter (series 2/ image 41).  Distal portion of the right pigtail catheter terminates in the bladder (series 2/ image 68), which is mildly thick-walled but underdistended.  Stomach/Bowel: Stomach is within normal limits.  No evidence of bowel obstruction.  Appendix is not discretely visualized.  Vascular/Lymphatic: Atherosclerotic calcifications of the abdominal aorta and branch vessels. 4.0 x 4.0 cm suprarenal abdominal aortic aneurysm (series 2/ image 22). Aorto bi-iliac stent.  No suspicious abdominopelvic lymphadenopathy.  Reproductive: Prostate is notable for dystrophic calcifications.  Other: Small volume right pelvic ascites.  Tiny fat containing left inguinal hernia.  Musculoskeletal: Degenerative changes of the visualized thoracolumbar spine, most prominent at L5-S1.  IMPRESSION: Moderate right hydronephrosis with indwelling double pigtail ureteral catheter. 2.0 x 1.1 cm right upper pole renal calculus. Additional 12 x 9 mm right proximal ureteral calculus adjacent to the  catheter.  Two calculi in the left proximal collecting system measuring 14 x 6 mm and 9 x 9 mm associated urothelial thickening, reflecting chronic inflammation.  Bladder wall thickening, correlate for cystitis.  4.0 x 4.0 cm suprarenal abdominal aortic aneurysm with indwelling aorto bi-iliac stent.  Moderate to large right pleural effusion, incompletely visualized. Associated compressive atelectasis in the right lower lobe. Trace left pleural effusion.  Additional ancillary findings as above.   Electronically Signed  By: Julian Hy M.D.  On: 06/06/2015 13:50  [place] CLINICAL DATA: Acute renal failure. Urolithiasis and hydronephrosis. Chronic systolic congestive heart failure.  EXAM: RENAL / URINARY TRACT ULTRASOUND COMPLETE  COMPARISON: Noncontrast CT on 06/06/2015  FINDINGS: Right Kidney:  Length: 13.9 cm. A 2 cm shadowing calculus is seen in the upper pole of the right kidney. Moderate right hydronephrosis is seen with the right ureteral stent in place. This appears similar to previous CT.  Left Kidney:  Length: 10.1 cm. Several small less than 1 cm shadowing calculi are seen. A 1.8 cm cyst is also seen in the midpole.  No definite renal mass identified. No evidence of hydronephrosis.  Bladder:  Empty with Foley catheter in place. Small amount of ascites noted.  IMPRESSION: Moderate right hydronephrosis with right ureteral stent in place. This is similar in appearance to recent CT.  No left hydronephrosis.  Bilateral nephrolithiasis.  Mild ascites.   Electronically Signed  By: Earle Gell M.D.  On: 08/23/2015 15:43  Assessment & Plan:    1. Hydronephrosis with urinary obstruction due to renal calculus  75 year old male with multiple medical problems with encrusted, retained right ureteral stent which is nonfunctioning status post right nephrostomy tube placement with improvement of his renal function as an  inpatient. We had a lengthy discussion today discussing his options on treating his ureteral stones as well as his encrusted stent. I recommended proceeding with right ureteroscopy and lasering alongside of the stent in order to free the stent initially and address as many stones as possible in one setting. We discussed that this will likely take multiple treatments in order to clear his overall stone burden. He's also high risk for sepsis given the duration of his ureteral stent and colonization. Risks and benefits of ureteroscopy were reviewed including but not limited to infection, bleeding, pain, ureteral injury which could require open surgery versus prolonged indwelling if ureteralperforation occurs, persistent stone disease, requirement for staged procedure, possible stent, and global anesthesia risks. Patient expressed understanding and desires to proceed with ureteroscopy.  He will need to have cardiac and pulmonary clearance for the operating room as he is a very poor surgical candidate plan to reach out Dr. Raul Del and Dr. Ubaldo Glassing for preop clearance/ optimization.    - CULTURE, URINE COMPREHENSIVE (preop, from right nephrostomy tube) -preop BMP/ CBC -cardiac and pulmonary optimization, clearance needed -OK to stay on ASA 81 mg  2. Retained ureteral stent As above   3. Acute on chronic renal failure (HCC) Recheck BMP preop  4. Candida UTI Colonized.  Plan for preop triple abx including diflucan.     Hollice Espy, MD  [facility] [address] [place], [place] [address] [phone]  I spent 30 min with this patient of which greater than 50% was spent in counseling and coordination of care with the patient. All he and his wife's questions were answered in detail.

## 2015-09-20 ENCOUNTER — Encounter: Payer: Self-pay | Admitting: Urology

## 2015-09-20 LAB — CULTURE, URINE COMPREHENSIVE

## 2015-09-23 DIAGNOSIS — J209 Acute bronchitis, unspecified: Secondary | ICD-10-CM | POA: Diagnosis not present

## 2015-10-01 ENCOUNTER — Emergency Department
Admission: EM | Admit: 2015-10-01 | Discharge: 2015-10-01 | Disposition: A | Discharge status: Home / Self Care | Payer: Medicaid Other | Attending: Emergency Medicine | Admitting: Emergency Medicine

## 2015-10-01 ENCOUNTER — Encounter: Payer: Self-pay | Admitting: Emergency Medicine

## 2015-10-01 ENCOUNTER — Emergency Department: Payer: Medicaid Other

## 2015-10-01 DIAGNOSIS — Z7982 Long term (current) use of aspirin: Secondary | ICD-10-CM | POA: Insufficient documentation

## 2015-10-01 DIAGNOSIS — I10 Essential (primary) hypertension: Secondary | ICD-10-CM | POA: Insufficient documentation

## 2015-10-01 DIAGNOSIS — F17210 Nicotine dependence, cigarettes, uncomplicated: Secondary | ICD-10-CM | POA: Insufficient documentation

## 2015-10-01 DIAGNOSIS — Y658 Other specified misadventures during surgical and medical care: Secondary | ICD-10-CM | POA: Diagnosis not present

## 2015-10-01 DIAGNOSIS — R05 Cough: Secondary | ICD-10-CM | POA: Diagnosis not present

## 2015-10-01 DIAGNOSIS — T83038A Leakage of other indwelling urethral catheter, initial encounter: Secondary | ICD-10-CM | POA: Insufficient documentation

## 2015-10-01 DIAGNOSIS — J441 Chronic obstructive pulmonary disease with (acute) exacerbation: Secondary | ICD-10-CM | POA: Insufficient documentation

## 2015-10-01 DIAGNOSIS — I110 Hypertensive heart disease with heart failure: Secondary | ICD-10-CM | POA: Diagnosis not present

## 2015-10-01 DIAGNOSIS — Z7951 Long term (current) use of inhaled steroids: Secondary | ICD-10-CM | POA: Diagnosis not present

## 2015-10-01 DIAGNOSIS — I5023 Acute on chronic systolic (congestive) heart failure: Secondary | ICD-10-CM | POA: Insufficient documentation

## 2015-10-01 DIAGNOSIS — R0602 Shortness of breath: Secondary | ICD-10-CM | POA: Diagnosis not present

## 2015-10-01 DIAGNOSIS — Z79899 Other long term (current) drug therapy: Secondary | ICD-10-CM | POA: Diagnosis not present

## 2015-10-01 LAB — BASIC METABOLIC PANEL
Anion gap: 7 (ref 5–15)
BUN: 59 mg/dL — ABNORMAL HIGH (ref 6–20)
CHLORIDE: 108 mmol/L (ref 101–111)
CO2: 22 mmol/L (ref 22–32)
CREATININE: 2 mg/dL — AB (ref 0.61–1.24)
Calcium: 8.3 mg/dL — ABNORMAL LOW (ref 8.9–10.3)
GFR calc non Af Amer: 31 mL/min — ABNORMAL LOW (ref >60)
GFR, EST AFRICAN AMERICAN: 36 mL/min — AB (ref >60)
Glucose, Bld: 103 mg/dL — ABNORMAL HIGH (ref 65–99)
Potassium: 4.9 mmol/L (ref 3.5–5.1)
Sodium: 137 mmol/L (ref 135–145)

## 2015-10-01 LAB — URINALYSIS COMPLETE WITH MICROSCOPIC (ARMC ONLY)
BACTERIA UA: NONE SEEN
Bilirubin Urine: NEGATIVE
Glucose, UA: NEGATIVE mg/dL
KETONES UR: NEGATIVE mg/dL
NITRITE: NEGATIVE
PROTEIN: 100 mg/dL — AB
SPECIFIC GRAVITY, URINE: 1.018 (ref 1.005–1.030)
Squamous Epithelial / LPF: NONE SEEN
pH: 6 (ref 5.0–8.0)

## 2015-10-01 LAB — BRAIN NATRIURETIC PEPTIDE: B Natriuretic Peptide: >4500 pg/mL — ABNORMAL HIGH (ref 0.0–100.0)

## 2015-10-01 LAB — CBC
HEMATOCRIT: 45.3 % (ref 40.0–52.0)
HEMOGLOBIN: 13.7 g/dL (ref 13.0–18.0)
MCH: 22.9 pg — AB (ref 26.0–34.0)
MCHC: 30.3 g/dL — ABNORMAL LOW (ref 32.0–36.0)
MCV: 75.6 fL — AB (ref 80.0–100.0)
PLATELETS: 200 10*3/uL (ref 150–440)
RBC: 5.99 MIL/uL — AB (ref 4.40–5.90)
RDW: 22.2 % — ABNORMAL HIGH (ref 11.5–14.5)
WBC: 12 10*3/uL — AB (ref 3.8–10.6)

## 2015-10-01 LAB — TROPONIN I: Troponin I: 0.03 ng/mL (ref <0.031)

## 2015-10-01 MED ORDER — HYDROCODONE-ACETAMINOPHEN 5-325 MG PO TABS
1.0000 | ORAL_TABLET | Freq: Once | ORAL | Status: AC
  Administered 2015-10-01: 1 via ORAL
  Filled 2015-10-01: qty 1

## 2015-10-01 MED ORDER — IPRATROPIUM-ALBUTEROL 0.5-2.5 (3) MG/3ML IN SOLN
3.0000 mL | Freq: Once | RESPIRATORY_TRACT | Status: AC
  Administered 2015-10-01: 3 mL via RESPIRATORY_TRACT
  Filled 2015-10-01: qty 3

## 2015-10-01 MED ORDER — MORPHINE SULFATE (PF) 4 MG/ML IV SOLN
4.0000 mg | Freq: Once | INTRAVENOUS | Status: AC
  Administered 2015-10-01: 4 mg via INTRAVENOUS
  Filled 2015-10-01: qty 1

## 2015-10-01 MED ORDER — FUROSEMIDE 10 MG/ML IJ SOLN
40.0000 mg | Freq: Once | INTRAMUSCULAR | Status: AC
  Administered 2015-10-01: 40 mg via INTRAVENOUS
  Filled 2015-10-01: qty 4

## 2015-10-01 MED ORDER — ONDANSETRON HCL 4 MG/2ML IJ SOLN
4.0000 mg | Freq: Once | INTRAMUSCULAR | Status: AC
  Administered 2015-10-01: 4 mg via INTRAVENOUS
  Filled 2015-10-01: qty 2

## 2015-10-01 NOTE — ED Provider Notes (Addendum)
[facility] Emergency Department Provider Note  ____________________________________________  Time seen: Approximately 3:52 PM  I have reviewed the triage vital signs and the nursing notes.   HISTORY  Chief Complaint Shortness of Breath    HPI Collin [name] is a 75 y.o. male patient complains of gradually worsening shortness of breath. Reports cough productive of thick sputum. He saw his doctor last Thursday was diagnosed with pneumonia and placed on doxycycline. He reports both of his sides of his chest hurting his ribs are sore from coughing. Shortness breath has been getting worse for last 2 days even more so than before. He reports edema he had in his legs is improved his chest tube drained that he has to the right side is draining fluid regularly. He's had that since October. His nephrostomy tube on the right side has a leaky bag started leaking last night. He is not having any fever. Shortness of breath gets worse with walking.   Past Medical History  Diagnosis Date  . COPD (chronic obstructive pulmonary disease) ([place])   . Chronic systolic CHF (congestive heart failure) ([place])   . Kidney stone   . Macular degeneration   . HLD (hyperlipidemia)   . Hypertensive retinopathy   . HTN (hypertension)   . GERD (gastroesophageal reflux disease)     Patient Active Problem List   Diagnosis Date Noted  . ARF (acute renal failure) ([place])   . Hydronephrosis   . UTI (lower urinary tract infection)   . Hydronephrosis with urinary obstruction due to renal calculus   . Acute congestive heart failure ([place]) 08/22/2015  . 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 Acute on chronic respiratory failure ([place]) 06/12/2015  . Acute on chronic systolic CHF (congestive heart failure) ([place]) 03/19/2015  . Open wound of left heel 03/19/2015  . Recurrent right pleural effusion 03/04/2015  . Acute respiratory failure with hypoxemia ([place]) 03/04/2015  . COPD (chronic  obstructive pulmonary disease) ([place]) 03/04/2015  . HLD (hyperlipidemia) 03/04/2015  . Chronic systolic CHF (congestive heart failure) ([place]) 03/04/2015  . HTN (hypertension) 03/04/2015  . GERD (gastroesophageal reflux disease) 03/04/2015    Past Surgical History  Procedure Laterality Date  . Abdominal aortic aneurysm repair    . Hernia repair    . Kidney stone surgery    . Chest tube insertion Right 07/07/2015    Procedure: INSERTION PLEURAL DRAINAGE CATHETER;  Surgeon: Nestor Lewandowsky, MD;  Location: [facility] ORS;  Service: Thoracic;  Laterality: Right;    Current Outpatient Rx  Name  Route  Sig  Dispense  Refill  . albuterol (PROVENTIL HFA;VENTOLIN HFA) 108 (90 BASE) MCG/ACT inhaler   Inhalation   Inhale 2 puffs into the lungs every 6 (six) hours as needed for wheezing or shortness of breath.          . ALPRAZolam (XANAX) 0.25 MG tablet   Oral   Take 0.25 mg by mouth at bedtime as needed for anxiety or sleep.          Marland Kitchen aspirin EC 81 MG tablet   Oral   Take 81 mg by mouth daily.         . carvedilol (COREG) 6.25 MG tablet   Oral   Take 6.25 mg by mouth 2 (two) times daily.          Marland Kitchen doxycycline (VIBRAMYCIN) 100 MG capsule   Oral   Take 100 mg by mouth 2 (two) times daily.         Marland Kitchen  Fluticasone-Salmeterol (ADVAIR) 100-50 MCG/DOSE AEPB   Inhalation   Inhale 1 puff into the lungs 2 (two) times daily.         . furosemide (LASIX) 40 MG tablet   Oral   Take 1 tablet (40 mg total) by mouth daily.   60 tablet   1   . Multiple Vitamin (MULTIVITAMIN WITH MINERALS) TABS tablet   Oral   Take 1 tablet by mouth daily.         . nitroGLYCERIN (NITROSTAT) 0.4 MG SL tablet   Oral   Take 0.4 mg by mouth every 5 (five) minutes x 3 doses as needed for chest pain.          Marland Kitchen omeprazole (PRILOSEC) 20 MG capsule   Oral   Take 20 mg by mouth 2 (two) times daily.          Marland Kitchen tiotropium (SPIRIVA HANDIHALER) 18 MCG inhalation capsule   Inhalation   Place 1 capsule (18  mcg total) into inhaler and inhale daily.   30 capsule   0   . oxyCODONE-acetaminophen (PERCOCET/ROXICET) 5-325 MG tablet   Oral   Take 1-2 tablets by mouth every 4 (four) hours as needed for moderate pain. Patient not taking: Reported on 10/01/2015   40 tablet   0     Allergies Lisinopril and Metoprolol  Family History  Problem Relation Age of Onset  . Heart attack Mother   . Stroke Mother   . Cancer Father   . COPD Father   . Heart attack Father     Social History Social History  Substance Use Topics  . Smoking status: Current Every Day Smoker -- 0.25 packs/day for 45 years    Types: Cigarettes  . Smokeless tobacco: Never Used  . Alcohol Use: No    Review of Systems Constitutional: No fever/chills Eyes: No visual changes. ENT: No sore throat. Cardiovascular: See history of present illness Respiratory: See history of present illness. Gastrointestinal: No abdominal pain.  No nausea, no vomiting.  No diarrhea.  No constipation. Genitourinary: Negative for dysuria. Musculoskeletal: Negative for back pain. Skin: Negative for rash. Neurological: Negative for headaches, focal weakness or numbness.  10-point ROS otherwise negative.  ____________________________________________   PHYSICAL EXAM:  VITAL SIGNS: ED Triage Vitals  Enc Vitals Group     BP 10/01/15 1113 127/87 mmHg     Pulse Rate 10/01/15 1113 86     Resp 10/01/15 1113 18     Temp 10/01/15 1113 97.7 F (36.5 C)     Temp Source 10/01/15 1113 Oral     SpO2 10/01/15 1113 97 %     Weight 10/01/15 1113 210 lb (95.255 kg)     Height 10/01/15 1113 6\' 2"  (1.88 m)     Head Cir --      Peak Flow --      Pain Score 10/01/15 1113 8     Pain Loc --      Pain Edu? --      Excl. in [place]? --     Constitutional: Alert and oriented. Well appearing and in no acute distress. Eyes: Conjunctivae are normal. PERRL. EOMI. Head: Atraumatic. Nose: No congestion/rhinnorhea. Mouth/Throat: Mucous membranes are moist.   Oropharynx non-erythematous. Neck: No stridor. JVD to the angle  }Cardiovascular: Normal rate, regular rhythm. Grossly normal heart sounds.  Good peripheral circulation. Respiratory: Normal respiratory effort.  No retractions. Lungs scattered wheezes Gastrointestinal: Soft and nontender. No distention. No abdominal bruits. No CVA tenderness. Musculoskeletal: No  lower extremity tenderness slight trace edema.  No joint effusions. Neurologic:  Normal speech and language. No gross focal neurologic deficits are appreciated. No gait instability. Skin:  Skin is warm, dry and intact. No rash noted. Psychiatric: Mood and affect are normal. Speech and behavior are normal.  ____________________________________________   LABS (all labs ordered are listed, but only abnormal results are displayed)  Labs Reviewed  BASIC METABOLIC PANEL - Abnormal; Notable for the following:    Glucose, Bld 103 (*)    BUN 59 (*)    Creatinine, Ser 2.00 (*)    Calcium 8.3 (*)    GFR calc non Af Amer 31 (*)    GFR calc Af Amer 36 (*)    All other components within normal limits  CBC - Abnormal; Notable for the following:    WBC 12.0 (*)    RBC 5.99 (*)    MCV 75.6 (*)    MCH 22.9 (*)    MCHC 30.3 (*)    RDW 22.2 (*)    All other components within normal limits  BRAIN NATRIURETIC PEPTIDE - Abnormal; Notable for the following:    B Natriuretic Peptide >4500.0 (*)    All other components within normal limits  URINALYSIS COMPLETEWITH MICROSCOPIC ([facility] ONLY) - Abnormal; Notable for the following:    Color, Urine YELLOW (*)    APPearance CLOUDY (*)    Hgb urine dipstick 2+ (*)    Protein, ur 100 (*)    Leukocytes, UA 3+ (*)    All other components within normal limits  TROPONIN I   ____________________________________________  EKG  EKG read and interpreted by me shows normal sinus rhythm rate of 87 normal axis poor baseline looks similar to one from 06/12/2015  _____________________________________  RADIOLOGY  Chest x-ray read by radiology and reviewed by me shows increased interstitial markings slightly larger effusion consistent with possible CHF. ____________________________________________   PROCEDURES  Patient ambulated in the ER he does not drop his O2 sats below 92. His heart rate goes up to about 108 but not any higher. He is not unsteady and to any large degree while he is walking. I discussed his urinalysis with Dr. Erlene Quan his urologist. She feels sees chronically colonized he does have an encrusted stent in place since he is not febrile and has no pain over the site we will not give him antibiotics for that. He does not have any pain in the CVA area either. We've replaced his ureterostomy bag with a new one per Dr. Erlene Quan instructions  ____________________________________________   INITIAL IMPRESSION / [place] / ED COURSE  Pertinent labs & imaging results that were available during my care of the patient were reviewed by me and considered in my medical decision making (see chart for details).   ____________________________________________   FINAL CLINICAL IMPRESSION(S) / ED DIAGNOSES  Final diagnoses:  Acute on chronic systolic congestive heart failure ([place])      Nena Polio, MD 10/01/15 1911  Patient also asked for some pain medicine for his rib pain he has been coughing a lot and has been complaining of pain in his ribs with coughing he is tender to touch I laterally over his ribs there is no point tenderness chest x-ray did not show any rib fractures I will give him some Vicodin 5/325 mg 4 times a day #20 to use as needed.  Nena Polio, MD 10/01/15 [phone]

## 2015-10-01 NOTE — ED Notes (Signed)
Currently being treated for PNA.  Has 2 more days of abx per family but sx getting worse..  Wears 2 L [place] at home at all times.  Had to sit on side of bed last night because could not breath.  Has nephrostomy tube bc of kidney stones. pleurex tube to right lung for effusions.

## 2015-10-01 NOTE — Discharge Instructions (Signed)
Heart Failure °Heart failure is a condition in which the heart has trouble pumping blood. This means your heart does not pump blood efficiently for your body to work well. In some cases of heart failure, fluid may back up into your lungs or you may have swelling (edema) in your lower legs. Heart failure is usually a long-term (chronic) condition. It is important for you to take good care of yourself and follow your health care provider's treatment plan. °CAUSES  °Some health conditions can cause heart failure. Those health conditions include: °· High blood pressure (hypertension). Hypertension causes the heart muscle to work harder than normal. When pressure in the blood vessels is high, the heart needs to pump (contract) with more force in order to circulate blood throughout the body. High blood pressure eventually causes the heart to become stiff and weak. °· Coronary artery disease (CAD). CAD is the buildup of cholesterol and fat (plaque) in the arteries of the heart. The blockage in the arteries deprives the heart muscle of oxygen and blood. This can cause chest pain and may lead to a heart attack. High blood pressure can also contribute to CAD. °· Heart attack (myocardial infarction). A heart attack occurs when one or more arteries in the heart become blocked. The loss of oxygen damages the muscle tissue of the heart. When this happens, part of the heart muscle dies. The injured tissue does not contract as well and weakens the heart's ability to pump blood. °· Abnormal heart valves. When the heart valves do not open and close properly, it can cause heart failure. This makes the heart muscle pump harder to keep the blood flowing. °· Heart muscle disease (cardiomyopathy or myocarditis). Heart muscle disease is damage to the heart muscle from a variety of causes. These can include drug or alcohol abuse, infections, or unknown reasons. These can increase the risk of heart failure. °· Lung disease. Lung disease  makes the heart work harder because the lungs do not work properly. This can cause a strain on the heart, leading it to fail. °· Diabetes. Diabetes increases the risk of heart failure. High blood sugar contributes to high fat (lipid) levels in the blood. Diabetes can also cause slow damage to tiny blood vessels that carry important nutrients to the heart muscle. When the heart does not get enough oxygen and food, it can cause the heart to become weak and stiff. This leads to a heart that does not contract efficiently. °· Other conditions can contribute to heart failure. These include abnormal heart rhythms, thyroid problems, and low blood counts (anemia). °Certain unhealthy behaviors can increase the risk of heart failure, including: °· Being overweight. °· Smoking or chewing tobacco. °· Eating foods high in fat and cholesterol. °· Abusing illicit drugs or alcohol. °· Lacking physical activity. °SYMPTOMS  °Heart failure symptoms may vary and can be hard to detect. Symptoms may include: °· Shortness of breath with activity, such as climbing stairs. °· Persistent cough. °· Swelling of the feet, ankles, legs, or abdomen. °· Unexplained weight gain. °· Difficulty breathing when lying flat (orthopnea). °· Waking from sleep because of the need to sit up and get more air. °· Rapid heartbeat. °· Fatigue and loss of energy. °· Feeling light-headed, dizzy, or close to fainting. °· Loss of appetite. °· Nausea. °· Increased urination during the night (nocturia). °DIAGNOSIS  °A diagnosis of heart failure is based on your history, symptoms, physical examination, and diagnostic tests. Diagnostic tests for heart failure may include: °·   Echocardiography. °· Electrocardiography. °· Chest X-ray. °· Blood tests. °· Exercise stress test. °· Cardiac angiography. °· Radionuclide scans. °TREATMENT  °Treatment is aimed at managing the symptoms of heart failure. Medicines, behavioral changes, or surgical intervention may be necessary to  treat heart failure. °· Medicines to help treat heart failure may include: °¨ Angiotensin-converting enzyme (ACE) inhibitors. This type of medicine blocks the effects of a blood protein called angiotensin-converting enzyme. ACE inhibitors relax (dilate) the blood vessels and help lower blood pressure. °¨ Angiotensin receptor blockers (ARBs). This type of medicine blocks the actions of a blood protein called angiotensin. Angiotensin receptor blockers dilate the blood vessels and help lower blood pressure. °¨ Water pills (diuretics). Diuretics cause the kidneys to remove salt and water from the blood. The extra fluid is removed through urination. This loss of extra fluid lowers the volume of blood the heart pumps. °¨ Beta blockers. These prevent the heart from beating too fast and improve heart muscle strength. °¨ Digitalis. This increases the force of the heartbeat. °· Healthy behavior changes include: °¨ Obtaining and maintaining a healthy weight. °¨ Stopping smoking or chewing tobacco. °¨ Eating heart-healthy foods. °¨ Limiting or avoiding alcohol. °¨ Stopping illicit drug use. °¨ Physical activity as directed by your health care provider. °· Surgical treatment for heart failure may include: °¨ A procedure to open blocked arteries, repair damaged heart valves, or remove damaged heart muscle tissue. °¨ A pacemaker to improve heart muscle function and control certain abnormal heart rhythms. °¨ An internal cardioverter defibrillator to treat certain serious abnormal heart rhythms. °¨ A left ventricular assist device (LVAD) to assist the pumping ability of the heart. °HOME CARE INSTRUCTIONS  °· Take medicines only as directed by your health care provider. Medicines are important in reducing the workload of your heart, slowing the progression of heart failure, and improving your symptoms. °¨ Do not stop taking your medicine unless directed by your health care provider. °¨ Do not skip any dose of medicine. °¨ Refill your  prescriptions before you run out of medicine. Your medicines are needed every day. °· Engage in moderate physical activity if directed by your health care provider. Moderate physical activity can benefit some people. The elderly and people with severe heart failure should consult with a health care provider for physical activity recommendations. °· Eat heart-healthy foods. Food choices should be free of trans fat and low in saturated fat, cholesterol, and salt (sodium). Healthy choices include fresh or frozen fruits and vegetables, fish, lean meats, legumes, fat-free or low-fat dairy products, and whole grain or high fiber foods. Talk to a dietitian to learn more about heart-healthy foods. °· Limit sodium if directed by your health care provider. Sodium restriction may reduce symptoms of heart failure in some people. Talk to a dietitian to learn more about heart-healthy seasonings. °· Use healthy cooking methods. Healthy cooking methods include roasting, grilling, broiling, baking, poaching, steaming, or stir-frying. Talk to a dietitian to learn more about healthy cooking methods. °· Limit fluids if directed by your health care provider. Fluid restriction may reduce symptoms of heart failure in some people. °· Weigh yourself every day. Daily weights are important in the early recognition of excess fluid. You should weigh yourself every morning after you urinate and before you eat breakfast. Wear the same amount of clothing each time you weigh yourself. Record your daily weight. Provide your health care provider with your weight record. °· Monitor and record your blood pressure if directed by your health care   provider.  Check your pulse if directed by your health care provider.  Lose weight if directed by your health care provider. Weight loss may reduce symptoms of heart failure in some people.  Stop smoking or chewing tobacco. Nicotine makes your heart work harder by causing your blood vessels to constrict.  Do not use nicotine gum or patches before talking to your health care provider.  Keep all follow-up visits as directed by your health care provider. This is important.  Limit alcohol intake to no more than 1 drink per day for nonpregnant women and 2 drinks per day for men. One drink equals 12 ounces of beer, 5 ounces of wine, or 1 ounces of hard liquor. Drinking more than that is harmful to your heart. Tell your health care provider if you drink alcohol several times a week. Talk with your health care provider about whether alcohol is safe for you. If your heart has already been damaged by alcohol or you have severe heart failure, drinking alcohol should be stopped completely.  Stop illicit drug use.  Stay up-to-date with immunizations. It is especially important to prevent respiratory infections through current pneumococcal and influenza immunizations.  Manage other health conditions such as hypertension, diabetes, thyroid disease, or abnormal heart rhythms as directed by your health care provider.  Learn to manage stress.  Plan rest periods when fatigued.  Learn strategies to manage high temperatures. If the weather is extremely hot:  Avoid vigorous physical activity.  Use air conditioning or fans or seek a cooler location.  Avoid caffeine and alcohol.  Wear loose-fitting, lightweight, and light-colored clothing.  Learn strategies to manage cold temperatures. If the weather is extremely cold:  Avoid vigorous physical activity.  Layer clothes.  Wear mittens or gloves, a hat, and a scarf when going outside.  Avoid alcohol.  Obtain ongoing education and support as needed.  Participate in or seek rehabilitation as needed to maintain or improve independence and quality of life. SEEK MEDICAL CARE IF:   You have a rapid weight gain.  You have increasing shortness of breath that is unusual for you.  You are unable to participate in your usual physical activities.  You tire  easily.  You cough more than normal, especially with physical activity.  You have any or more swelling in areas such as your hands, feet, ankles, or abdomen.  You are unable to sleep because it is hard to breathe.  You feel like your heart is beating fast (palpitations).  You become dizzy or light-headed upon standing up. SEEK IMMEDIATE MEDICAL CARE IF:   You have difficulty breathing.  There is a change in mental status such as decreased alertness or difficulty with concentration.  You have a pain or discomfort in your chest.  You have an episode of fainting (syncope). MAKE SURE YOU:   Understand these instructions.  Will watch your condition.  Will get help right away if you are not doing well or get worse.   This information is not intended to replace advice given to you by your health care provider. Make sure you discuss any questions you have with your health care provider.   Document Released: 08/28/2005 Document Revised: 01/12/2015 Document Reviewed: 09/27/2012 Elsevier Interactive Patient Education [employer].   Please follow-up with your regular doctor on Monday. Please return if you're worse again. Please take one extra dose of Lasix tomorrow. After that resume your regular Lasix.

## 2015-10-01 NOTE — ED Notes (Signed)
Pt given popsicle.

## 2015-10-05 DIAGNOSIS — F17200 Nicotine dependence, unspecified, uncomplicated: Secondary | ICD-10-CM | POA: Diagnosis not present

## 2015-10-05 DIAGNOSIS — I10 Essential (primary) hypertension: Secondary | ICD-10-CM | POA: Diagnosis not present

## 2015-10-05 DIAGNOSIS — J42 Unspecified chronic bronchitis: Secondary | ICD-10-CM | POA: Diagnosis not present

## 2015-10-05 DIAGNOSIS — R0789 Other chest pain: Secondary | ICD-10-CM | POA: Diagnosis not present

## 2015-10-05 DIAGNOSIS — E782 Mixed hyperlipidemia: Secondary | ICD-10-CM | POA: Diagnosis not present

## 2015-10-05 DIAGNOSIS — I5023 Acute on chronic systolic (congestive) heart failure: Secondary | ICD-10-CM | POA: Diagnosis not present

## 2015-10-05 MED ORDER — ONDANSETRON HCL 4 MG/2ML IJ SOLN
INTRAMUSCULAR | Status: AC
  Filled 2015-10-05: qty 2

## 2015-10-06 ENCOUNTER — Telehealth: Payer: Self-pay | Admitting: Radiology

## 2015-10-06 NOTE — Telephone Encounter (Signed)
Notified pt of surgery scheduled 10/25/15, pre-admit testing appt on 10/15/15 @9 :00 and to call Friday prior to surgery for arrival time to SDS. Pt voices understanding.

## 2015-10-08 ENCOUNTER — Encounter: Payer: Self-pay | Admitting: *Deleted

## 2015-10-08 ENCOUNTER — Emergency Department: Payer: Medicaid Other

## 2015-10-08 ENCOUNTER — Emergency Department
Admission: EM | Admit: 2015-10-08 | Discharge: 2015-10-08 | Disposition: A | Discharge status: Home / Self Care | Payer: Medicaid Other | Attending: Emergency Medicine | Admitting: Emergency Medicine

## 2015-10-08 DIAGNOSIS — R110 Nausea: Secondary | ICD-10-CM | POA: Insufficient documentation

## 2015-10-08 DIAGNOSIS — J441 Chronic obstructive pulmonary disease with (acute) exacerbation: Secondary | ICD-10-CM | POA: Diagnosis not present

## 2015-10-08 DIAGNOSIS — L03116 Cellulitis of left lower limb: Secondary | ICD-10-CM | POA: Diagnosis not present

## 2015-10-08 DIAGNOSIS — R0602 Shortness of breath: Secondary | ICD-10-CM | POA: Diagnosis not present

## 2015-10-08 DIAGNOSIS — Z7982 Long term (current) use of aspirin: Secondary | ICD-10-CM | POA: Insufficient documentation

## 2015-10-08 DIAGNOSIS — I10 Essential (primary) hypertension: Secondary | ICD-10-CM | POA: Diagnosis not present

## 2015-10-08 DIAGNOSIS — F17210 Nicotine dependence, cigarettes, uncomplicated: Secondary | ICD-10-CM | POA: Insufficient documentation

## 2015-10-08 DIAGNOSIS — Z7951 Long term (current) use of inhaled steroids: Secondary | ICD-10-CM | POA: Diagnosis not present

## 2015-10-08 DIAGNOSIS — Z79899 Other long term (current) drug therapy: Secondary | ICD-10-CM | POA: Diagnosis not present

## 2015-10-08 DIAGNOSIS — R42 Dizziness and giddiness: Secondary | ICD-10-CM | POA: Diagnosis not present

## 2015-10-08 DIAGNOSIS — R0600 Dyspnea, unspecified: Secondary | ICD-10-CM

## 2015-10-08 DIAGNOSIS — I5023 Acute on chronic systolic (congestive) heart failure: Secondary | ICD-10-CM | POA: Diagnosis not present

## 2015-10-08 DIAGNOSIS — J439 Emphysema, unspecified: Secondary | ICD-10-CM | POA: Diagnosis not present

## 2015-10-08 DIAGNOSIS — J90 Pleural effusion, not elsewhere classified: Secondary | ICD-10-CM | POA: Diagnosis not present

## 2015-10-08 DIAGNOSIS — M7989 Other specified soft tissue disorders: Secondary | ICD-10-CM | POA: Diagnosis not present

## 2015-10-08 DIAGNOSIS — N200 Calculus of kidney: Secondary | ICD-10-CM | POA: Diagnosis not present

## 2015-10-08 DIAGNOSIS — M79672 Pain in left foot: Secondary | ICD-10-CM | POA: Diagnosis not present

## 2015-10-08 DIAGNOSIS — M79675 Pain in left toe(s): Secondary | ICD-10-CM | POA: Diagnosis not present

## 2015-10-08 LAB — BASIC METABOLIC PANEL
Anion gap: 9 (ref 5–15)
BUN: 52 mg/dL — ABNORMAL HIGH (ref 6–20)
CHLORIDE: 103 mmol/L (ref 101–111)
CO2: 27 mmol/L (ref 22–32)
CREATININE: 1.86 mg/dL — AB (ref 0.61–1.24)
Calcium: 8.6 mg/dL — ABNORMAL LOW (ref 8.9–10.3)
GFR calc non Af Amer: 34 mL/min — ABNORMAL LOW (ref >60)
GFR, EST AFRICAN AMERICAN: 40 mL/min — AB (ref >60)
GLUCOSE: 101 mg/dL — AB (ref 65–99)
Potassium: 4 mmol/L (ref 3.5–5.1)
Sodium: 139 mmol/L (ref 135–145)

## 2015-10-08 LAB — CBC
HCT: 47.9 % (ref 40.0–52.0)
Hemoglobin: 14.8 g/dL (ref 13.0–18.0)
MCH: 23 pg — AB (ref 26.0–34.0)
MCHC: 30.9 g/dL — AB (ref 32.0–36.0)
MCV: 74.2 fL — AB (ref 80.0–100.0)
PLATELETS: 207 10*3/uL (ref 150–440)
RBC: 6.45 MIL/uL — AB (ref 4.40–5.90)
RDW: 22.9 % — ABNORMAL HIGH (ref 11.5–14.5)
WBC: 9.9 10*3/uL (ref 3.8–10.6)

## 2015-10-08 LAB — TROPONIN I: Troponin I: 0.05 ng/mL — ABNORMAL HIGH (ref <0.031)

## 2015-10-08 LAB — BRAIN NATRIURETIC PEPTIDE: B Natriuretic Peptide: >4500 pg/mL — ABNORMAL HIGH (ref 0.0–100.0)

## 2015-10-08 LAB — GLUCOSE, CAPILLARY: Glucose-Capillary: 95 mg/dL (ref 65–99)

## 2015-10-08 MED ORDER — ONDANSETRON HCL 4 MG/2ML IJ SOLN
4.0000 mg | Freq: Once | INTRAMUSCULAR | Status: AC
  Administered 2015-10-08: 4 mg via INTRAVENOUS

## 2015-10-08 MED ORDER — OXYCODONE-ACETAMINOPHEN 5-325 MG PO TABS: 1.0000 | ORAL_TABLET | Freq: Four times a day (QID) | ORAL | Status: DC | PRN

## 2015-10-08 MED ORDER — MORPHINE SULFATE (PF) 4 MG/ML IV SOLN
4.0000 mg | Freq: Once | INTRAVENOUS | Status: AC
  Administered 2015-10-08: 4 mg via INTRAVENOUS

## 2015-10-08 MED ORDER — HYDROMORPHONE HCL 1 MG/ML IJ SOLN
1.0000 mg | Freq: Once | INTRAMUSCULAR | Status: AC
Start: 2015-10-08 — End: 2015-10-08
  Administered 2015-10-08: 1 mg via INTRAVENOUS

## 2015-10-08 MED ORDER — CEPHALEXIN 500 MG PO CAPS: 500.0000 mg | ORAL_CAPSULE | Freq: Three times a day (TID) | ORAL | Status: DC

## 2015-10-08 MED ORDER — HYDROMORPHONE HCL 1 MG/ML IJ SOLN
INTRAMUSCULAR | Status: AC
  Administered 2015-10-08: 1 mg via INTRAVENOUS
  Filled 2015-10-08: qty 1

## 2015-10-08 MED ORDER — ONDANSETRON HCL 4 MG/2ML IJ SOLN
INTRAMUSCULAR | Status: AC
  Administered 2015-10-08: 4 mg via INTRAVENOUS
  Filled 2015-10-08: qty 2

## 2015-10-08 MED ORDER — MORPHINE SULFATE (PF) 4 MG/ML IV SOLN
INTRAVENOUS | Status: AC
  Administered 2015-10-08: 4 mg via INTRAVENOUS
  Filled 2015-10-08: qty 1

## 2015-10-08 NOTE — ED Notes (Signed)
Dr Kerman Passey notified of troponin 0.05

## 2015-10-08 NOTE — ED Notes (Signed)
Pt given sandwich. Ok per dr Anthonette Legato

## 2015-10-08 NOTE — ED Notes (Signed)
Returned from xray

## 2015-10-08 NOTE — ED Provider Notes (Signed)
[facility] Emergency Department Provider Note  Time seen: 11:03 AM  I have reviewed the triage vital signs and the nursing notes.   HISTORY  Chief Complaint Dizziness and Shortness of Breath    HPI Collin [name] is a 75 y.o. male with a past medical history of COPD, CHF, kidney stones, hyperlipidemia, hypertension, status post right nephrostomy tube, status post right pleuracentesis drain, presents to the emergency department with shortness of breath, dizziness and left foot pain. According to the patient he was scheduled for a cardiac stress test today however patient presented with dizziness and shortness of breath, was ultimately deemed to be unstable for stress tests and sent to the emergency department for evaluation. Patient is maintained concerns her several days of worsening dizziness, nausea, increased shortness of breath with increased pleural effusion fluid production. Patient has a Pleurx tube in the right chest and they have been draining approximately 1 L of fluid per day per wife. Patient also has a right nephrostomy tube which is draining appropriately. Patient also states 2 months ago he dropped a can of sauerkraut on his left foot. It has been mildly tender since then however approximately 3 days ago is now becoming exquisitely tender with increased redness. Denies fever. Does state nausea but denies vomiting.     Past Medical History  Diagnosis Date  . COPD (chronic obstructive pulmonary disease) ([place])   . Chronic systolic CHF (congestive heart failure) ([place])   . Kidney stone   . Macular degeneration   . HLD (hyperlipidemia)   . Hypertensive retinopathy   . HTN (hypertension)   . GERD (gastroesophageal reflux disease)     Patient Active Problem List   Diagnosis Date Noted  . ARF (acute renal failure) ([place])   . Hydronephrosis   . UTI (lower urinary tract infection)   . Hydronephrosis with urinary obstruction due to renal calculus   .  Acute congestive heart failure ([place]) 08/22/2015  . 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 Acute on chronic respiratory failure ([place]) 06/12/2015  . Acute on chronic systolic CHF (congestive heart failure) ([place]) 03/19/2015  . Open wound of left heel 03/19/2015  . Recurrent right pleural effusion 03/04/2015  . Acute respiratory failure with hypoxemia ([place]) 03/04/2015  . COPD (chronic obstructive pulmonary disease) ([place]) 03/04/2015  . HLD (hyperlipidemia) 03/04/2015  . Chronic systolic CHF (congestive heart failure) ([place]) 03/04/2015  . HTN (hypertension) 03/04/2015  . GERD (gastroesophageal reflux disease) 03/04/2015    Past Surgical History  Procedure Laterality Date  . Abdominal aortic aneurysm repair    . Hernia repair    . Kidney stone surgery    . Chest tube insertion Right 07/07/2015    Procedure: INSERTION PLEURAL DRAINAGE CATHETER;  Surgeon: Nestor Lewandowsky, MD;  Location: [facility] ORS;  Service: Thoracic;  Laterality: Right;    Current Outpatient Rx  Name  Route  Sig  Dispense  Refill  . albuterol (PROVENTIL HFA;VENTOLIN HFA) 108 (90 BASE) MCG/ACT inhaler   Inhalation   Inhale 2 puffs into the lungs every 6 (six) hours as needed for wheezing or shortness of breath.          . ALPRAZolam (XANAX) 0.25 MG tablet   Oral   Take 0.25 mg by mouth at bedtime as needed for anxiety or sleep.          Marland Kitchen aspirin EC 81 MG tablet   Oral   Take 81 mg by mouth daily.         Marland Kitchen  carvedilol (COREG) 6.25 MG tablet   Oral   Take 6.25 mg by mouth 2 (two) times daily.          . Fluticasone-Salmeterol (ADVAIR) 100-50 MCG/DOSE AEPB   Inhalation   Inhale 1 puff into the lungs 2 (two) times daily.         . furosemide (LASIX) 40 MG tablet   Oral   Take 1 tablet (40 mg total) by mouth daily.   60 tablet   1   . Multiple Vitamin (MULTIVITAMIN WITH MINERALS) TABS tablet   Oral   Take 1 tablet by mouth daily.         . nitroGLYCERIN (NITROSTAT) 0.4 MG SL tablet   Oral    Take 0.4 mg by mouth every 5 (five) minutes x 3 doses as needed for chest pain.          Marland Kitchen omeprazole (PRILOSEC) 20 MG capsule   Oral   Take 20 mg by mouth 2 (two) times daily.          Marland Kitchen oxyCODONE-acetaminophen (PERCOCET/ROXICET) 5-325 MG tablet   Oral   Take 1-2 tablets by mouth every 4 (four) hours as needed for moderate pain. Patient not taking: Reported on 10/01/2015   40 tablet   0   . tiotropium (SPIRIVA HANDIHALER) 18 MCG inhalation capsule   Inhalation   Place 1 capsule (18 mcg total) into inhaler and inhale daily.   30 capsule   0     Allergies Lisinopril and Metoprolol  Family History  Problem Relation Age of Onset  . Heart attack Mother   . Stroke Mother   . Cancer Father   . COPD Father   . Heart attack Father     Social History Social History  Substance Use Topics  . Smoking status: Current Every Day Smoker -- 0.25 packs/day for 45 years    Types: Cigarettes  . Smokeless tobacco: Never Used  . Alcohol Use: No    Review of Systems Constitutional: Negative for fever. Cardiovascular: Negative for chest pain. Respiratory: Positive for increased shortness breath over the past 3 days. Gastrointestinal: Negative for abdominal pain. Positive nausea. Negative for vomiting or diarrhea. Skin: Redness and swelling to left foot. Neurological: Negative for headache 10-point ROS otherwise negative.  ____________________________________________   PHYSICAL EXAM:  VITAL SIGNS: ED Triage Vitals  Enc Vitals Group     BP 10/08/15 0959 141/92 mmHg     Pulse Rate 10/08/15 0959 101     Resp 10/08/15 0959 22     Temp 10/08/15 0959 98 F (36.7 C)     Temp Source 10/08/15 0959 Oral     SpO2 10/08/15 0959 100 %     Weight 10/08/15 0959 187 lb (84.823 kg)     Height 10/08/15 0959 6\' 2"  (1.88 m)     Head Cir --      Peak Flow --      Pain Score 10/08/15 1003 8     Pain Loc --      Pain Edu? --      Excl. in [place]? --     Constitutional: Alert and oriented.  Well appearing and in no distress. Eyes: Normal exam ENT   Head: Normocephalic and atraumatic.   Mouth/Throat: Mucous membranes are moist. Cardiovascular: Normal rate, regular rhythm. No murmur Respiratory: Slight tachypnea around 22-24 breaths per minute. Mild expiratory wheeze. No rales or rhonchi. Gastrointestinal: Soft and nontender. No distention.   Musculoskeletal: Moderate erythema with significant tenderness  to palpation of the left foot. 1+ DP pulses bilaterally. No calf tenderness bilaterally. Neurologic:  Normal speech and language. No gross focal neurologic deficits  Skin:  Skin is warm, dry and intact.  Psychiatric: Mood and affect are normal. Speech and behavior are normal.  ____________________________________________    EKG  EKG reviewed and interpreted by myself shows sinus tachycardia 106 bpm, narrow QRS, normal axis, normal intervals, nonspecific ST changes but no ST elevations.  ____________________________________________    RADIOLOGY  Chest x-ray shows enlarged cardiac silhouette with mild pulmonary edema, right greater than left pleural effusion.   INITIAL IMPRESSION / ASSESSMENT AND PLAN / ED COURSE  Pertinent labs & imaging results that were available during my care of the patient were reviewed by me and considered in my medical decision making (see chart for details).  Patient presents for several days of worsening shortness of breath, dizziness and nausea, also with left foot pain and swelling and redness. Patient's exam of his left foot appears most consistent with cellulitis. Given his injury approximately 2 months ago we'll obtain an x-ray to rule out fracture. Patient's foot is very tender to palpation. We will check labs, chest x-ray, treat pain and nausea and closely monitor in the emergency department. Patient agreeable to plan.    ----------------------------------------- 1:51 PM on  10/08/2015 ----------------------------------------- Patient's labs are largely at his baseline. Patient is on 40 mg of Lasix 3 times a day. Patient does have mild to moderate lower extremity edema. We'll place the patient on 80 mg of Lasix in the morning and then 40 mg in the afternoon and evening. We'll have the patient follow-up with his cardiologist. Given his left foot pain and redness we'll place the patient on antibiotics, pain medication, have him follow up with primary care physician Monday for recheck. Discussed with the patient and if his shortness of breath worsens, or if he has increased redness, fever, or any other symptoms concerning to himself he should return to the emergency department for recheck/reevaluation. Patient agreeable.  ____________________________________________   FINAL CLINICAL IMPRESSION(S) / ED DIAGNOSES  Shortness of breath Left foot cellulitis   Harvest Dark, MD 10/08/15 1353

## 2015-10-08 NOTE — ED Notes (Signed)
Pt complains of dizziness , nausea and increased shortness of breath, pt has a right pleur ex tube in place since 10/16, pt was scheduled for a cardiac stress test which was not performed due to dizziness, pt sent to PCP than sent to ED

## 2015-10-08 NOTE — Discharge Instructions (Signed)
As we discussed please follow-up with your primary care doctor and cardiologist on Monday for recheck/reevaluation. Please take your antibiotics and pain medication as prescribed. Please increase your morning Lasix to 80 mg, followed by 40 mg at lunch time, and 40 mg in the evening. Return to the emergency department for any worsening trouble breathing, chest pain, increased left foot swelling, redness, or fever.    Cellulitis Cellulitis is an infection of the skin and the tissue under the skin. The infected area is usually red and tender. This happens most often in the arms and lower legs. HOME CARE   Take your antibiotic medicine as told. Finish the medicine even if you start to feel better.  Keep the infected arm or leg raised (elevated).  Put a warm cloth on the area up to 4 times per day.  Only take medicines as told by your doctor.  Keep all doctor visits as told. GET HELP IF:  You see red streaks on the skin coming from the infected area.  Your red area gets bigger or turns a dark color.  Your bone or joint under the infected area is painful after the skin heals.  Your infection comes back in the same area or different area.  You have a puffy (swollen) bump in the infected area.  You have new symptoms.  You have a fever. GET HELP RIGHT AWAY IF:   You feel very sleepy.  You throw up (vomit) or have watery poop (diarrhea).  You feel sick and have muscle aches and pains.   This information is not intended to replace advice given to you by your health care provider. Make sure you discuss any questions you have with your health care provider.   Document Released: 02/14/2008 Document Revised: 05/19/2015 Document Reviewed: 11/13/2011 Elsevier Interactive Patient Education 2016 [employer].  Shortness of Breath Shortness of breath means you have trouble breathing. Shortness of breath needs medical care right away. HOME CARE   Do not smoke.  Avoid being around  chemicals or things (paint fumes, dust) that may bother your breathing.  Rest as needed. Slowly begin your normal activities.  Only take medicines as told by your doctor.  Keep all doctor visits as told. GET HELP RIGHT AWAY IF:   Your shortness of breath gets worse.  You feel lightheaded, pass out (faint), or have a cough that is not helped by medicine.  You cough up blood.  You have pain with breathing.  You have pain in your chest, arms, shoulders, or belly (abdomen).  You have a fever.  You cannot walk up stairs or exercise the way you normally do.  You do not get better in the time expected.  You have a hard time doing normal activities even with rest.  You have problems with your medicines.  You have any new symptoms. MAKE SURE YOU:  Understand these instructions.  Will watch your condition.  Will get help right away if you are not doing well or get worse.   This information is not intended to replace advice given to you by your health care provider. Make sure you discuss any questions you have with your health care provider.   Document Released: 02/14/2008 Document Revised: 09/02/2013 Document Reviewed: 11/13/2011 Elsevier Interactive Patient Education [employer].

## 2015-10-15 ENCOUNTER — Encounter
Admission: RE | Admit: 2015-10-15 | Discharge: 2015-10-15 | Disposition: A | Discharge status: Home / Self Care | Payer: Medicare Other | Source: Ambulatory Visit | Attending: Urology | Admitting: Urology

## 2015-10-15 DIAGNOSIS — I255 Ischemic cardiomyopathy: Secondary | ICD-10-CM | POA: Diagnosis not present

## 2015-10-15 DIAGNOSIS — J439 Emphysema, unspecified: Secondary | ICD-10-CM | POA: Diagnosis not present

## 2015-10-15 DIAGNOSIS — R0600 Dyspnea, unspecified: Secondary | ICD-10-CM | POA: Diagnosis not present

## 2015-10-15 DIAGNOSIS — J90 Pleural effusion, not elsewhere classified: Secondary | ICD-10-CM | POA: Diagnosis not present

## 2015-10-15 DIAGNOSIS — F17200 Nicotine dependence, unspecified, uncomplicated: Secondary | ICD-10-CM | POA: Diagnosis not present

## 2015-10-15 DIAGNOSIS — R0609 Other forms of dyspnea: Secondary | ICD-10-CM | POA: Diagnosis not present

## 2015-10-15 NOTE — Pre-Procedure Instructions (Signed)
Patient arrived via [facility] for PAT appt was not wearing O2 c/o SOB O2 sat 97 on RA, resp 24 and labored. Family member sent to car to get O2. Family member stated patient seen in ED for cellulitis and SOB on 10/08/15. Patient was placed on antibiotic and increased Lasix and discharged. They had not yet contacted PMD for f/u. Comes in today for pre-op appt with above symptoms. Contacted Amy RN at Dr Netty Starring office and made aware of patient symptoms and requested patient be sent to office for evaluation. Pt given information to go to Dr Netty Starring office immediately and verbalized understanding.

## 2015-10-20 ENCOUNTER — Other Ambulatory Visit: Payer: Self-pay | Admitting: Urology

## 2015-10-20 DIAGNOSIS — N1339 Other hydronephrosis: Secondary | ICD-10-CM

## 2015-10-21 ENCOUNTER — Telehealth: Payer: Self-pay | Admitting: Radiology

## 2015-10-21 NOTE — Telephone Encounter (Signed)
Notified pt's wife, Silva Bandy, of nephrostomy tube exchange scheduled 10/29/15. Pt should arrive at [place] at 10:00, be npo after mn day of procedure and take Coreg with small sip of water. He will need a driver after the procedure. Also notified Silva Bandy of surgery r/s to 11/15/15, pre-admit testing appt on 2/27 @8 :00 and to call Friday prior to surgery for arrival time to SDS. Phyllis voices understanding.

## 2015-10-22 ENCOUNTER — Inpatient Hospital Stay: Admission: RE | Admit: 2015-10-22 | Payer: Medicare Other | Source: Ambulatory Visit

## 2015-10-25 MED ORDER — MIDAZOLAM HCL 5 MG/5ML IJ SOLN
INTRAMUSCULAR | Status: AC
  Filled 2015-10-25: qty 5

## 2015-10-25 MED ORDER — FENTANYL CITRATE (PF) 100 MCG/2ML IJ SOLN
INTRAMUSCULAR | Status: AC
  Filled 2015-10-25: qty 2

## 2015-10-25 MED ORDER — HEPARIN SODIUM (PORCINE) 1000 UNIT/ML IJ SOLN
INTRAMUSCULAR | Status: AC
  Filled 2015-10-25: qty 1

## 2015-10-26 ENCOUNTER — Emergency Department: Payer: Medicare Other

## 2015-10-26 ENCOUNTER — Inpatient Hospital Stay
Admission: EM | Admit: 2015-10-26 | Discharge: 2015-10-29 | DRG: 871 | Disposition: A | Discharge status: Home / Self Care | Payer: Medicare Other | Attending: Internal Medicine | Admitting: Internal Medicine

## 2015-10-26 ENCOUNTER — Other Ambulatory Visit: Payer: Self-pay

## 2015-10-26 ENCOUNTER — Encounter: Payer: Self-pay | Admitting: Emergency Medicine

## 2015-10-26 DIAGNOSIS — J440 Chronic obstructive pulmonary disease with acute lower respiratory infection: Secondary | ICD-10-CM | POA: Diagnosis present

## 2015-10-26 DIAGNOSIS — Z79899 Other long term (current) drug therapy: Secondary | ICD-10-CM

## 2015-10-26 DIAGNOSIS — A419 Sepsis, unspecified organism: Secondary | ICD-10-CM | POA: Diagnosis not present

## 2015-10-26 DIAGNOSIS — E785 Hyperlipidemia, unspecified: Secondary | ICD-10-CM | POA: Diagnosis present

## 2015-10-26 DIAGNOSIS — J441 Chronic obstructive pulmonary disease with (acute) exacerbation: Secondary | ICD-10-CM

## 2015-10-26 DIAGNOSIS — Y95 Nosocomial condition: Secondary | ICD-10-CM | POA: Diagnosis present

## 2015-10-26 DIAGNOSIS — I509 Heart failure, unspecified: Secondary | ICD-10-CM | POA: Diagnosis not present

## 2015-10-26 DIAGNOSIS — Z823 Family history of stroke: Secondary | ICD-10-CM | POA: Diagnosis not present

## 2015-10-26 DIAGNOSIS — I739 Peripheral vascular disease, unspecified: Secondary | ICD-10-CM | POA: Diagnosis present

## 2015-10-26 DIAGNOSIS — Z888 Allergy status to other drugs, medicaments and biological substances status: Secondary | ICD-10-CM | POA: Diagnosis not present

## 2015-10-26 DIAGNOSIS — N183 Chronic kidney disease, stage 3 (moderate): Secondary | ICD-10-CM | POA: Diagnosis present

## 2015-10-26 DIAGNOSIS — F17210 Nicotine dependence, cigarettes, uncomplicated: Secondary | ICD-10-CM | POA: Diagnosis present

## 2015-10-26 DIAGNOSIS — Z7951 Long term (current) use of inhaled steroids: Secondary | ICD-10-CM | POA: Diagnosis not present

## 2015-10-26 DIAGNOSIS — R0602 Shortness of breath: Secondary | ICD-10-CM | POA: Diagnosis not present

## 2015-10-26 DIAGNOSIS — J9611 Chronic respiratory failure with hypoxia: Secondary | ICD-10-CM | POA: Diagnosis present

## 2015-10-26 DIAGNOSIS — H35039 Hypertensive retinopathy, unspecified eye: Secondary | ICD-10-CM | POA: Diagnosis present

## 2015-10-26 DIAGNOSIS — Z7982 Long term (current) use of aspirin: Secondary | ICD-10-CM | POA: Diagnosis not present

## 2015-10-26 DIAGNOSIS — R05 Cough: Secondary | ICD-10-CM | POA: Diagnosis not present

## 2015-10-26 DIAGNOSIS — H3530 Unspecified macular degeneration: Secondary | ICD-10-CM | POA: Diagnosis present

## 2015-10-26 DIAGNOSIS — J189 Pneumonia, unspecified organism: Secondary | ICD-10-CM | POA: Diagnosis not present

## 2015-10-26 DIAGNOSIS — L03116 Cellulitis of left lower limb: Secondary | ICD-10-CM

## 2015-10-26 DIAGNOSIS — Z8249 Family history of ischemic heart disease and other diseases of the circulatory system: Secondary | ICD-10-CM | POA: Diagnosis not present

## 2015-10-26 DIAGNOSIS — K219 Gastro-esophageal reflux disease without esophagitis: Secondary | ICD-10-CM | POA: Diagnosis present

## 2015-10-26 DIAGNOSIS — J918 Pleural effusion in other conditions classified elsewhere: Secondary | ICD-10-CM | POA: Diagnosis not present

## 2015-10-26 DIAGNOSIS — N1330 Unspecified hydronephrosis: Secondary | ICD-10-CM

## 2015-10-26 DIAGNOSIS — F419 Anxiety disorder, unspecified: Secondary | ICD-10-CM | POA: Diagnosis present

## 2015-10-26 DIAGNOSIS — N200 Calculus of kidney: Secondary | ICD-10-CM | POA: Diagnosis present

## 2015-10-26 DIAGNOSIS — I10 Essential (primary) hypertension: Secondary | ICD-10-CM | POA: Diagnosis present

## 2015-10-26 DIAGNOSIS — I5022 Chronic systolic (congestive) heart failure: Secondary | ICD-10-CM | POA: Diagnosis present

## 2015-10-26 DIAGNOSIS — J449 Chronic obstructive pulmonary disease, unspecified: Secondary | ICD-10-CM | POA: Diagnosis present

## 2015-10-26 DIAGNOSIS — I714 Abdominal aortic aneurysm, without rupture: Secondary | ICD-10-CM | POA: Diagnosis present

## 2015-10-26 DIAGNOSIS — N132 Hydronephrosis with renal and ureteral calculous obstruction: Secondary | ICD-10-CM

## 2015-10-26 DIAGNOSIS — I130 Hypertensive heart and chronic kidney disease with heart failure and stage 1 through stage 4 chronic kidney disease, or unspecified chronic kidney disease: Secondary | ICD-10-CM | POA: Diagnosis present

## 2015-10-26 DIAGNOSIS — Z9981 Dependence on supplemental oxygen: Secondary | ICD-10-CM | POA: Diagnosis not present

## 2015-10-26 DIAGNOSIS — I96 Gangrene, not elsewhere classified: Secondary | ICD-10-CM

## 2015-10-26 DIAGNOSIS — J90 Pleural effusion, not elsewhere classified: Secondary | ICD-10-CM | POA: Diagnosis present

## 2015-10-26 DIAGNOSIS — J181 Lobar pneumonia, unspecified organism: Secondary | ICD-10-CM | POA: Diagnosis not present

## 2015-10-26 LAB — COMPREHENSIVE METABOLIC PANEL
ALT: 21 U/L (ref 17–63)
AST: 30 U/L (ref 15–41)
Albumin: 3 g/dL — ABNORMAL LOW (ref 3.5–5.0)
Alkaline Phosphatase: 168 U/L — ABNORMAL HIGH (ref 38–126)
Anion gap: 7 (ref 5–15)
BILIRUBIN TOTAL: 0.8 mg/dL (ref 0.3–1.2)
BUN: 29 mg/dL — AB (ref 6–20)
CO2: 28 mmol/L (ref 22–32)
Calcium: 8.3 mg/dL — ABNORMAL LOW (ref 8.9–10.3)
Chloride: 104 mmol/L (ref 101–111)
Creatinine, Ser: 1.44 mg/dL — ABNORMAL HIGH (ref 0.61–1.24)
GFR calc Af Amer: 54 mL/min — ABNORMAL LOW (ref >60)
GFR, EST NON AFRICAN AMERICAN: 47 mL/min — AB (ref >60)
Glucose, Bld: 150 mg/dL — ABNORMAL HIGH (ref 65–99)
POTASSIUM: 4.3 mmol/L (ref 3.5–5.1)
Sodium: 139 mmol/L (ref 135–145)
TOTAL PROTEIN: 6.4 g/dL — AB (ref 6.5–8.1)

## 2015-10-26 LAB — CBC
HEMATOCRIT: 43.2 % (ref 40.0–52.0)
Hemoglobin: 12.9 g/dL — ABNORMAL LOW (ref 13.0–18.0)
MCH: 22.4 pg — AB (ref 26.0–34.0)
MCHC: 29.8 g/dL — AB (ref 32.0–36.0)
MCV: 75.1 fL — AB (ref 80.0–100.0)
Platelets: 318 10*3/uL (ref 150–440)
RBC: 5.75 MIL/uL (ref 4.40–5.90)
RDW: 23.2 % — AB (ref 11.5–14.5)
WBC: 9.4 10*3/uL (ref 3.8–10.6)

## 2015-10-26 LAB — TROPONIN I: TROPONIN I: 0.03 ng/mL (ref <0.031)

## 2015-10-26 MED ORDER — TIOTROPIUM BROMIDE MONOHYDRATE 18 MCG IN CAPS
18.0000 ug | ORAL_CAPSULE | Freq: Every day | RESPIRATORY_TRACT | Status: DC
  Administered 2015-10-27 – 2015-10-29 (×3): 18 ug via RESPIRATORY_TRACT
  Filled 2015-10-26: qty 5

## 2015-10-26 MED ORDER — HYDROMORPHONE HCL 1 MG/ML IJ SOLN
INTRAMUSCULAR | Status: AC
  Filled 2015-10-26: qty 2

## 2015-10-26 MED ORDER — LEVOFLOXACIN IN D5W 750 MG/150ML IV SOLN
750.0000 mg | INTRAVENOUS | Status: DC
Start: 2015-10-27 — End: 2015-10-27
  Filled 2015-10-26: qty 150

## 2015-10-26 MED ORDER — HEPARIN SODIUM (PORCINE) 5000 UNIT/ML IJ SOLN
5000.0000 [IU] | Freq: Three times a day (TID) | INTRAMUSCULAR | Status: DC
  Administered 2015-10-26 – 2015-10-28 (×7): 5000 [IU] via SUBCUTANEOUS
  Filled 2015-10-26 (×7): qty 1

## 2015-10-26 MED ORDER — IPRATROPIUM-ALBUTEROL 0.5-2.5 (3) MG/3ML IN SOLN
RESPIRATORY_TRACT | Status: AC
Start: 2015-10-26 — End: 2015-10-27
  Filled 2015-10-26: qty 3

## 2015-10-26 MED ORDER — OXYCODONE-ACETAMINOPHEN 5-325 MG PO TABS
1.0000 | ORAL_TABLET | Freq: Four times a day (QID) | ORAL | Status: DC | PRN
  Administered 2015-10-27: 1 via ORAL
  Filled 2015-10-26: qty 1

## 2015-10-26 MED ORDER — LEVOFLOXACIN IN D5W 750 MG/150ML IV SOLN
750.0000 mg | Freq: Once | INTRAVENOUS | Status: AC
  Administered 2015-10-26: 750 mg via INTRAVENOUS
  Filled 2015-10-26: qty 150

## 2015-10-26 MED ORDER — IPRATROPIUM-ALBUTEROL 0.5-2.5 (3) MG/3ML IN SOLN
9.0000 mL | Freq: Once | RESPIRATORY_TRACT | Status: AC
  Administered 2015-10-26: 9 mL via RESPIRATORY_TRACT
  Filled 2015-10-26: qty 9

## 2015-10-26 MED ORDER — VANCOMYCIN HCL 10 G IV SOLR
1250.0000 mg | INTRAVENOUS | Status: DC
  Administered 2015-10-27: 1250 mg via INTRAVENOUS
  Filled 2015-10-26 (×2): qty 1250

## 2015-10-26 MED ORDER — HYDROMORPHONE HCL 1 MG/ML IJ SOLN
1.0000 mg | Freq: Once | INTRAMUSCULAR | Status: AC
  Administered 2015-10-26: 1 mg via INTRAVENOUS
  Filled 2015-10-26: qty 1

## 2015-10-26 MED ORDER — ADULT MULTIVITAMIN W/MINERALS CH
1.0000 | ORAL_TABLET | Freq: Every day | ORAL | Status: DC
  Administered 2015-10-27 – 2015-10-29 (×3): 1 via ORAL
  Filled 2015-10-26 (×3): qty 1

## 2015-10-26 MED ORDER — PIPERACILLIN-TAZOBACTAM 3.375 G IVPB
3.3750 g | Freq: Once | INTRAVENOUS | Status: AC
  Administered 2015-10-26: 3.375 g via INTRAVENOUS
  Filled 2015-10-26: qty 50

## 2015-10-26 MED ORDER — PANTOPRAZOLE SODIUM 40 MG PO TBEC
40.0000 mg | DELAYED_RELEASE_TABLET | Freq: Every day | ORAL | Status: DC
Start: 2015-10-27 — End: 2015-10-29
  Administered 2015-10-27 – 2015-10-29 (×3): 40 mg via ORAL
  Filled 2015-10-26 (×3): qty 1

## 2015-10-26 MED ORDER — SODIUM CHLORIDE 0.9 % IV BOLUS (SEPSIS)
1000.0000 mL | Freq: Once | INTRAVENOUS | Status: AC
  Administered 2015-10-26: 1000 mL via INTRAVENOUS

## 2015-10-26 MED ORDER — CARVEDILOL 6.25 MG PO TABS
6.2500 mg | ORAL_TABLET | Freq: Two times a day (BID) | ORAL | Status: DC
  Administered 2015-10-26 – 2015-10-29 (×6): 6.25 mg via ORAL
  Filled 2015-10-26 (×6): qty 1

## 2015-10-26 MED ORDER — ALBUTEROL SULFATE (2.5 MG/3ML) 0.083% IN NEBU: 2.5000 mg | INHALATION_SOLUTION | RESPIRATORY_TRACT | Status: DC | PRN

## 2015-10-26 MED ORDER — HYDROMORPHONE HCL 1 MG/ML IJ SOLN
2.0000 mg | Freq: Once | INTRAMUSCULAR | Status: AC
  Administered 2015-10-26: 2 mg via INTRAVENOUS

## 2015-10-26 MED ORDER — PIPERACILLIN-TAZOBACTAM 3.375 G IVPB 30 MIN
3.3750 g | Freq: Three times a day (TID) | INTRAVENOUS | Status: DC
  Administered 2015-10-26 – 2015-10-29 (×8): 3.375 g via INTRAVENOUS
  Filled 2015-10-26 (×11): qty 50

## 2015-10-26 MED ORDER — NITROGLYCERIN 0.4 MG SL SUBL: 0.4000 mg | SUBLINGUAL_TABLET | SUBLINGUAL | Status: DC | PRN

## 2015-10-26 MED ORDER — VANCOMYCIN HCL 10 G IV SOLR
1250.0000 mg | Freq: Once | INTRAVENOUS | Status: AC
  Administered 2015-10-26: 1250 mg via INTRAVENOUS
  Filled 2015-10-26: qty 1250

## 2015-10-26 MED ORDER — IPRATROPIUM-ALBUTEROL 0.5-2.5 (3) MG/3ML IN SOLN
3.0000 mL | Freq: Four times a day (QID) | RESPIRATORY_TRACT | Status: DC
  Administered 2015-10-26 – 2015-10-29 (×11): 3 mL via RESPIRATORY_TRACT
  Filled 2015-10-26 (×12): qty 3

## 2015-10-26 MED ORDER — FUROSEMIDE 40 MG PO TABS
40.0000 mg | ORAL_TABLET | Freq: Three times a day (TID) | ORAL | Status: DC
  Administered 2015-10-26 – 2015-10-29 (×8): 40 mg via ORAL
  Filled 2015-10-26 (×8): qty 1

## 2015-10-26 MED ORDER — ASPIRIN EC 81 MG PO TBEC
81.0000 mg | DELAYED_RELEASE_TABLET | Freq: Every day | ORAL | Status: DC
Start: 2015-10-27 — End: 2015-10-29
  Administered 2015-10-27 – 2015-10-29 (×3): 81 mg via ORAL
  Filled 2015-10-26 (×3): qty 1

## 2015-10-26 MED ORDER — ALPRAZOLAM 0.25 MG PO TABS
0.2500 mg | ORAL_TABLET | Freq: Every evening | ORAL | Status: DC | PRN
  Administered 2015-10-26 – 2015-10-28 (×3): 0.25 mg via ORAL
  Filled 2015-10-26 (×3): qty 1

## 2015-10-26 MED ORDER — HYDROMORPHONE HCL 1 MG/ML IJ SOLN
1.0000 mg | INTRAMUSCULAR | Status: DC | PRN
  Administered 2015-10-26 – 2015-10-29 (×14): 1 mg via INTRAVENOUS
  Filled 2015-10-26 (×15): qty 1

## 2015-10-26 MED ORDER — IPRATROPIUM-ALBUTEROL 0.5-2.5 (3) MG/3ML IN SOLN
3.0000 mL | Freq: Once | RESPIRATORY_TRACT | Status: AC
  Administered 2015-10-26: 3 mL via RESPIRATORY_TRACT

## 2015-10-26 MED ORDER — MOMETASONE FURO-FORMOTEROL FUM 100-5 MCG/ACT IN AERO
2.0000 | INHALATION_SPRAY | Freq: Two times a day (BID) | RESPIRATORY_TRACT | Status: DC
  Administered 2015-10-26 – 2015-10-29 (×6): 2 via RESPIRATORY_TRACT
  Filled 2015-10-26: qty 8.8

## 2015-10-26 NOTE — ED Provider Notes (Signed)
[facility] Emergency Department Provider Note  ____________________________________________  Time seen: 3:40 PM  I have reviewed the triage vital signs and the nursing notes.   HISTORY  Chief Complaint Shortness of Breath    HPI Collin [name] is a 75 y.o. male who complains of worsening shortness of breath and productive cough over the past 2-3 days. He is on 2 L home oxygen at home but feels like he can't catch his breath and has a hard time breathing back out when he does take a breath. He also was recently treated for cellulitis on his left foot with Keflex but even after completing the course of Keflex notes that the redness and swelling of the left foot are worsening as well as the pain. Denies any chest pain but does note pain in his right lower ribs when he coughs.     Past Medical History  Diagnosis Date  . COPD (chronic obstructive pulmonary disease) ([place])   . Chronic systolic CHF (congestive heart failure) ([place])   . Kidney stone   . Macular degeneration   . HLD (hyperlipidemia)   . Hypertensive retinopathy   . HTN (hypertension)   . GERD (gastroesophageal reflux disease)      Patient Active Problem List   Diagnosis Date Noted  . ARF (acute renal failure) ([place])   . Hydronephrosis   . UTI (lower urinary tract infection)   . Hydronephrosis with urinary obstruction due to renal calculus   . Acute congestive heart failure ([place]) 08/22/2015  . 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 Acute on chronic respiratory failure ([place]) 06/12/2015  . Acute on chronic systolic CHF (congestive heart failure) ([place]) 03/19/2015  . Open wound of left heel 03/19/2015  . Recurrent right pleural effusion 03/04/2015  . Acute respiratory failure with hypoxemia ([place]) 03/04/2015  . COPD (chronic obstructive pulmonary disease) ([place]) 03/04/2015  . HLD (hyperlipidemia) 03/04/2015  . Chronic systolic CHF (congestive heart failure) ([place]) 03/04/2015  . HTN  (hypertension) 03/04/2015  . GERD (gastroesophageal reflux disease) 03/04/2015     Past Surgical History  Procedure Laterality Date  . Abdominal aortic aneurysm repair    . Hernia repair    . Kidney stone surgery    . Chest tube insertion Right 07/07/2015    Procedure: INSERTION PLEURAL DRAINAGE CATHETER;  Surgeon: Nestor Lewandowsky, MD;  Location: [facility] ORS;  Service: Thoracic;  Laterality: Right;     Current Outpatient Rx  Name  Route  Sig  Dispense  Refill  . albuterol (PROVENTIL HFA;VENTOLIN HFA) 108 (90 BASE) MCG/ACT inhaler   Inhalation   Inhale 2 puffs into the lungs every 6 (six) hours as needed for wheezing or shortness of breath.          . ALPRAZolam (XANAX) 0.25 MG tablet   Oral   Take 0.25 mg by mouth at bedtime as needed for anxiety or sleep.          Marland Kitchen aspirin EC 81 MG tablet   Oral   Take 81 mg by mouth daily.         . carvedilol (COREG) 6.25 MG tablet   Oral   Take 6.25 mg by mouth 2 (two) times daily.          . cephALEXin (KEFLEX) 500 MG capsule   Oral   Take 1 capsule (500 mg total) by mouth 3 (three) times daily.   30 capsule   0   . Fluticasone-Salmeterol (ADVAIR) 100-50 MCG/DOSE AEPB  Inhalation   Inhale 1 puff into the lungs 2 (two) times daily.         . furosemide (LASIX) 40 MG tablet   Oral   Take 1 tablet (40 mg total) by mouth daily. Patient taking differently: Take 80 mg by mouth daily.    60 tablet   1   . Multiple Vitamin (MULTIVITAMIN WITH MINERALS) TABS tablet   Oral   Take 1 tablet by mouth daily.         . nitroGLYCERIN (NITROSTAT) 0.4 MG SL tablet   Oral   Take 0.4 mg by mouth every 5 (five) minutes x 3 doses as needed for chest pain. Reported on 10/08/2015         . omeprazole (PRILOSEC) 20 MG capsule   Oral   Take 20 mg by mouth 2 (two) times daily.          Marland Kitchen oxyCODONE-acetaminophen (ROXICET) 5-325 MG tablet   Oral   Take 1 tablet by mouth every 6 (six) hours as needed.   20 tablet   0   .  tiotropium (SPIRIVA HANDIHALER) 18 MCG inhalation capsule   Inhalation   Place 1 capsule (18 mcg total) into inhaler and inhale daily.   30 capsule   0      Allergies Lisinopril and Metoprolol   Family History  Problem Relation Age of Onset  . Heart attack Mother   . Stroke Mother   . Cancer Father   . COPD Father   . Heart attack Father     Social History Social History  Substance Use Topics  . Smoking status: Current Every Day Smoker -- 0.25 packs/day for 45 years    Types: Cigarettes  . Smokeless tobacco: Never Used  . Alcohol Use: No    Review of Systems  Constitutional:   Positive chills. No weight changes Eyes:   No blurry vision or double vision.  ENT:   No sore throat. Cardiovascular:   No chest pain. Respiratory:  Shortness of breath and cough. Gastrointestinal:   Negative for abdominal pain, vomiting and diarrhea.  No BRBPR or melena. Genitourinary:   Negative for dysuria, urinary retention, bloody urine, or difficulty urinating. Musculoskeletal:   Positive left foot pain and swelling. Skin:   Negative for rash. Neurological:   Negative for headaches, focal weakness or numbness. Psychiatric:  No anxiety or depression.   Endocrine:  No hot/cold intolerance, changes in energy, or sleep difficulty.  10-point ROS otherwise negative.  ____________________________________________   PHYSICAL EXAM:  VITAL SIGNS: ED Triage Vitals  Enc Vitals Group     BP 10/26/15 1215 146/95 mmHg     Pulse Rate 10/26/15 1215 106     Resp 10/26/15 1215 24     Temp --      Temp src --      SpO2 10/26/15 1215 100 %     Weight 10/26/15 1215 180 lb (81.647 kg)     Height 10/26/15 1215 6\' 2"  (1.88 m)     Head Cir --      Peak Flow --      Pain Score 10/26/15 1438 9     Pain Loc --      Pain Edu? --      Excl. in [place]? --     Vital signs reviewed, nursing assessments reviewed.   Constitutional:   Alert and oriented. Moderate respiratory distress. Eyes:   No  scleral icterus. No conjunctival pallor. PERRL. [facility]  Head:   Normocephalic and atraumatic.   Nose:   No congestion/rhinnorhea. No septal hematoma   Mouth/Throat:   Dry mucous membranes, no pharyngeal erythema. No peritonsillar mass. No uvula shift.   Neck:   No stridor. No SubQ emphysema. No meningismus. Hematological/Lymphatic/Immunilogical:   No cervical lymphadenopathy. Cardiovascular:   Tachycardia heart rate 110. Normal and symmetric distal pulses are present in all extremities. No murmurs, rubs, or gallops. Respiratory:   Tachypnea, increased work of breathing. Diffuse expiratory wheezing, prolonged expiratory phase. Wheezing is accentuated with forceful expiration. Pleurx catheter in place in the right chest.  Right inferolateral chest wall tender to the touch reproduces his pain Gastrointestinal:   Soft and nontender. No distention. There is no CVA tenderness.  No rebound, rigidity, or guarding. Genitourinary:   deferred Musculoskeletal:   Nontender with normal range of motion in all extremities except left foot where there is nonpitting edema of the foot. There is also erythema of the left fifth toe with dry gangrene on the volar aspect at the base of the toenail. There is also nonblanching deep purple erythema at the base of the toe and extending along the foot. No lymphangitis or crepitus. No fluctuance.  Neurologic:   Normal speech and language.  CN 2-10 normal. Motor grossly intact. No gross focal neurologic deficits are appreciated.  Skin:    Skin is warm, dry and intact. No rash noted.  No petechiae, purpura, or bullae. Psychiatric:   Mood and affect are normal. Speech and behavior are normal. Patient exhibits appropriate insight and judgment.  ____________________________________________    LABS (pertinent positives/negatives) (all labs ordered are listed, but only abnormal results are displayed) Labs Reviewed  CBC - Abnormal; Notable for the following:     Hemoglobin 12.9 (*)    MCV 75.1 (*)    MCH 22.4 (*)    MCHC 29.8 (*)    RDW 23.2 (*)    All other components within normal limits  COMPREHENSIVE METABOLIC PANEL - Abnormal; Notable for the following:    Glucose, Bld 150 (*)    BUN 29 (*)    Creatinine, Ser 1.44 (*)    Calcium 8.3 (*)    Total Protein 6.4 (*)    Albumin 3.0 (*)    Alkaline Phosphatase 168 (*)    GFR calc non Af Amer 47 (*)    GFR calc Af Amer 54 (*)    All other components within normal limits  CULTURE, BLOOD (ROUTINE X 2)  CULTURE, BLOOD (ROUTINE X 2)  TROPONIN I   ____________________________________________   EKG  Interpreted by me Sinus tachycardia rate 103, normal axis and intervals. Normal QRS ST segments and T waves. There is an isolated T-wave inversion in V6 which is nonspecific  ____________________________________________    RADIOLOGY  Chest x-ray showsinfiltrate in the right base and increased pleural effusion.  X-ray of the left foot essentially unremarkable.  ____________________________________________   PROCEDURES   ____________________________________________   INITIAL IMPRESSION / ASSESSMENT AND PLAN / ED COURSE  Pertinent labs & imaging results that were available during my care of the patient were reviewed by me and considered in my medical decision making (see chart for details).  Patient presents with increased work of breathing and wheezing consistent with COPD exacerbation. This appears to be related to a pneumonia. With his multiple medical problems and drooling catheter and recent treatment with Keflex for a unrelated cellulitis, I think we should treat this as a healthcare associated pneumonia at this point. Additionally he  does have worsening cellulitis on the left foot and evidence of gangrene developing in the left fifth toe. X-ray performed which did not show any obvious osteomyelitis and there is no evidence of abscess or necrotizing fasciitis. With all these things  combined we'll treat with IV vancomycin and Zosyn and Levaquin and plan for admission. Blood cultures ordered.     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

## 2015-10-26 NOTE — H&P (Signed)
[place] [facility] [place] NAME: Collin [name]    MR#:  [id number]  DATE OF BIRTH:  10/18/1940  DATE OF ADMISSION:  10/26/2015  PRIMARY CARE PHYSICIAN: [name], MD   REQUESTING/REFERRING PHYSICIAN: Dr. Joni Fears  CHIEF COMPLAINT:   Chief Complaint  Patient presents with  . Shortness of Breath    HISTORY OF PRESENT ILLNESS:  Collin [name]  is a 75 y.o. male with a known history of COPD, chronic hypoxic respiratory failure on 2L [place], recurrent pleural effusion with right PleurX catheter, nephrostomy tube presents with worsening dyspnea x few days and worsening left foot pain. He admits to chronic cough, however last few days it has become constant, severe, with sputum color change - thick yellow - associated with severe dyspnea on minimal exertion and constant wheezing. No improvement with home medications. In ED noted to have pneumonia. He has received, Vanc, Levaquin, Zosyn, blood cultures.  Medical records were reviewed; additionally about 2 months ago he dropped a can of sauerkraut on his left foot, subsequently has had constant, moderate, aching pain, associated with redness; no aggravating factors. He was seen in the ED 3 weeks ago for it and received Keflex without improvement. He has continued to have pain in the foot and there has been color change to the left 5th toe.   Admits to chills. No fevers. Admits to b/l "cold feeling: to bilateral legs from knee to toes; + pain in calves with walking.  PAST MEDICAL HISTORY:   Past Medical History  Diagnosis Date  . COPD (chronic obstructive pulmonary disease) ([place])   . Chronic systolic CHF (congestive heart failure) ([place])   . Kidney stone   . Macular degeneration   . HLD (hyperlipidemia)   . Hypertensive retinopathy   . HTN (hypertension)   . GERD (gastroesophageal reflux disease)     PAST SURGICAL HISTORY:   Past Surgical History  Procedure Laterality Date  . Abdominal  aortic aneurysm repair    . Hernia repair    . Kidney stone surgery    . Chest tube insertion Right 07/07/2015    Procedure: INSERTION PLEURAL DRAINAGE CATHETER;  Surgeon: Nestor Lewandowsky, MD;  Location: [facility] ORS;  Service: Thoracic;  Laterality: Right;    SOCIAL HISTORY:   Social History  Substance Use Topics  . Smoking status: Current Every Day Smoker -- 0.25 packs/day for 45 years    Types: Cigarettes  . Smokeless tobacco: Never Used  . Alcohol Use: No    FAMILY HISTORY:   Family History  Problem Relation Age of Onset  . Heart attack Mother   . Stroke Mother   . Cancer Father   . COPD Father   . Heart attack Father     DRUG ALLERGIES:   Allergies  Allergen Reactions  . Lisinopril Anaphylaxis  . Metoprolol Other (See Comments)    Reaction:  Ringing in ears     REVIEW OF SYSTEMS:   Review of Systems  Constitutional: Positive for chills. Negative for fever and weight loss.  HENT: Negative for tinnitus.   Eyes: Negative for blurred vision and double vision.  Respiratory: Positive for cough, sputum production, shortness of breath and wheezing. Negative for hemoptysis.   Cardiovascular: Negative for chest pain, palpitations and leg swelling.  Gastrointestinal: Negative for vomiting, diarrhea and constipation.  Genitourinary: Negative for dysuria, urgency and frequency.  Musculoskeletal: Negative for myalgias, back pain and neck pain.  Skin:       Left  foot erythematous, with purple color to left 5th toe  Neurological: Positive for sensory change and weakness. Negative for dizziness and headaches.       Reports numbness to b/l lower extremities from knees to toes  Psychiatric/Behavioral: Negative for suicidal ideas and memory loss.    MEDICATIONS AT HOME:   Prior to Admission medications   Medication Sig Start Date End Date Taking? Authorizing Provider  albuterol (PROVENTIL HFA;VENTOLIN HFA) 108 (90 BASE) MCG/ACT inhaler Inhale 1-2 puffs into the lungs every 4  (four) hours as needed for wheezing or shortness of breath.    Yes Historical Provider, MD  ALPRAZolam Duanne Moron) 0.25 MG tablet Take 0.25 mg by mouth at bedtime as needed for anxiety or sleep.    Yes Historical Provider, MD  aspirin EC 81 MG tablet Take 81 mg by mouth daily.   Yes Historical Provider, MD  carvedilol (COREG) 6.25 MG tablet Take 6.25 mg by mouth 2 (two) times daily.    Yes Historical Provider, MD  Fluticasone-Salmeterol (ADVAIR) 100-50 MCG/DOSE AEPB Inhale 1 puff into the lungs 2 (two) times daily.   Yes Historical Provider, MD  furosemide (LASIX) 40 MG tablet Take 40 mg by mouth 3 (three) times daily.   Yes Historical Provider, MD  Multiple Vitamin (MULTIVITAMIN WITH MINERALS) TABS tablet Take 1 tablet by mouth daily.   Yes Historical Provider, MD  nitroGLYCERIN (NITROSTAT) 0.4 MG SL tablet Take 0.4 mg by mouth every 5 (five) minutes x 3 doses as needed for chest pain.    Yes Historical Provider, MD  omeprazole (PRILOSEC) 20 MG capsule Take 20 mg by mouth 2 (two) times daily.    Yes Historical Provider, MD  oxyCODONE-acetaminophen (ROXICET) 5-325 MG tablet Take 1 tablet by mouth every 6 (six) hours as needed. Patient taking differently: Take 1 tablet by mouth every 6 (six) hours as needed for severe pain.  10/08/15  Yes Harvest Dark, MD  tiotropium (SPIRIVA HANDIHALER) 18 MCG inhalation capsule Place 1 capsule (18 mcg total) into inhaler and inhale daily. 03/06/15  Yes Srikar Sudini, MD  cephALEXin (KEFLEX) 500 MG capsule Take 1 capsule (500 mg total) by mouth 3 (three) times daily. Patient not taking: Reported on 10/26/2015 10/08/15   Harvest Dark, MD      VITAL SIGNS:  Blood pressure 124/91, pulse 107, resp. rate 17, height 6\' 2"  (1.88 m), weight 81.647 kg (180 lb), SpO2 100 %.  PHYSICAL EXAMINATION:  Physical Exam  GENERAL:  75 y.o.-year-old patient lying in the bed, ill appearing, mild respriatory distress with coughing fit EYES: Pupils equal, round, reactive to light  and accommodation. No scleral icterus. Extraocular muscles intact.  HEENT: Head atraumatic, normocephalic. Oropharynx and nasopharynx clear. No oropharyngeal erythema, moist oral mucosa  NECK:  Supple, no jugular venous distention. No tenderness.  LUNGS: audible wheezes, scattered rhonchi. No accessory muscles of respiration.  CARDIOVASCULAR: S1, S2 normal. No murmurs, rubs, or gallops.  ABDOMEN: Soft, nontender, nondistended. Bowel sounds present. No organomegaly or mass.  EXTREMITIES: No pedal edema. No hair on LE, unable to palpate distal radial/dorsalis pedis pulses, purplish color to left 5th toe, erythema extends to middle of foot on dorsal aspect with some edema, plantar aspect of foot has blotchy purple hue NEUROLOGIC: Cranial nerves II through XII are intact. No focal Motor or sensory deficits appreciated b/l PSYCHIATRIC: The patient is alert and oriented x 3. Good affect.  SKIN: No obvious rash, lesion, or ulcer on other aspects of skin. Purplish color to left 5th toe with  denuded skin on the medial side of that toe; erythema extends to middle of foot on dorsal aspect with some edema, plantar aspect of foot has blotchy purple hue.    LABORATORY PANEL:   CBC  Recent Labs Lab 10/26/15 1223  WBC 9.4  HGB 12.9*  HCT 43.2  PLT 318   ------------------------------------------------------------------------------------------------------------------  Chemistries   Recent Labs Lab 10/26/15 1223  NA 139  K 4.3  CL 104  CO2 28  GLUCOSE 150*  BUN 29*  CREATININE 1.44*  CALCIUM 8.3*  AST 30  ALT 21  ALKPHOS 168*  BILITOT 0.8   ------------------------------------------------------------------------------------------------------------------  Cardiac Enzymes  Recent Labs Lab 10/26/15 1223  TROPONINI 0.03   Blood cultures sent and pending ------------------------------------------------------------------------------------------------------------------  RADIOLOGY:    CXR Increased right pleural effusion and basilar airspace disease. The patient appears to have a pleural drain in place.  Trace left pleural effusion.  Cardiomegaly and vascular congestion.  Left foot XR Negative.  No conventional radiographic evidence of osteomyelitis.  IMPRESSION AND PLAN:    Principal Problem:   Pneumonia Active Problems:   Sepsis ([place])   Cellulitis of left foot   Chronic respiratory failure with hypoxia (HCC)   Recurrent right pleural effusion   COPD (chronic obstructive pulmonary disease) (H[facility])   HTN (hypertension)   Gangrenous toe (HCC)   Pneumonia with sepsis present on admission as evidenced by tachycardia and tachypnea: continue broad spectrum antibiotics (zosyn, levaquin, vanc) given his chronic pleurX catheter he is at risk for nosocomial organisms. F/u blood cultures. Pulmonology consulted  Left foot cellulitis with possible left 5th toe gangrene: continue abx, attempt ABIs with SVR to assess for PVD, consulted vascular surgery   COPD exacerbation: nebs, respiratory protocol  Chronic hypoxic respiratory failure: stable on home oxygen  HTN: BP stable  All other chronic medical conditions stable.   All the records are reviewed and case discussed with ED provider. Management plans discussed with the patient, family and they are in agreement.  CODE STATUS: FULL CODE, surrogate decision maker is his wife Arris Rauth, [phone]  TOTAL TIME TAKING CARE OF THIS PATIENT: 70 minutes.    Samson Frederic D.O on 10/26/2015 at 8:19 PM  After 6pm go to [email or web address] - password [email or web address] [facility]  Office  [phone]  CC: Primary care physician; [name], MD   Note: This dictation was prepared with Dragon dictation along with smaller phrase technology. Any transcriptional errors that result from this process are unintentional.

## 2015-10-26 NOTE — ED Notes (Signed)
Pt reports mouth breathing because "I can't breath through my nose, my head's stopped up"

## 2015-10-26 NOTE — Progress Notes (Signed)
Attempted to get report but was told I would be called back

## 2015-10-26 NOTE — Progress Notes (Signed)
COPD gold offered to pt. And he refused stating he's not going to quit smoking any way so he doesn't need it.

## 2015-10-26 NOTE — Progress Notes (Signed)
Pharmacy Antibiotic Note  Collin [name] is a 75 y.o. male admitted on 10/26/2015 with pneumonia.  Pharmacy has been consulted for vancomycin dosing.  Plan: Vancomycin 1.25 gm IV Q18H with stacked dosing, second dose approximately 9 hours after first, predicted trough 16 mcg/mL. Pharmacy will continue to follow and adjust as needed to maintain trough 15 to 20 mcg/mL.  Vd 57.1 L, Ke 0.048 hr-1, T1/2 14.4 hr.  Height: 6\' 2"  (188 cm) Weight: 180 lb (81.647 kg) IBW/kg (Calculated) : 82.2  Temp (24hrs), Avg:97.4 F (36.3 C), Min:97.4 F (36.3 C), Max:97.4 F (36.3 C)   Recent Labs Lab 10/26/15 1223  WBC 9.4  CREATININE 1.44*    Estimated Creatinine Clearance: 52.7 mL/min (by C-G formula based on Cr of 1.44).    Allergies  Allergen Reactions  . Lisinopril Anaphylaxis  . Metoprolol Other (See Comments)    Reaction:  Ringing in ears      Thank you for allowing pharmacy to be a part of this patient's care.  Laural Benes, Pharm.D., BCPS Clinical Pharmacist 10/26/2015 10:24 PM

## 2015-10-26 NOTE — ED Notes (Signed)
Attempted to call report

## 2015-10-26 NOTE — ED Notes (Signed)
COPD exacerbation

## 2015-10-26 NOTE — ED Notes (Signed)
Pt here with c/o sob, cough, worsening over the past few days, on 2L home O2, sats 100%. Pt appears in moderate respiratory distress.

## 2015-10-27 LAB — CBC WITH DIFFERENTIAL/PLATELET
Basophils Absolute: 0.1 10*3/uL (ref 0–0.1)
EOS ABS: 0.1 10*3/uL (ref 0–0.7)
Eosinophils Relative: 2 %
HCT: 39.8 % — ABNORMAL LOW (ref 40.0–52.0)
HEMOGLOBIN: 12.2 g/dL — AB (ref 13.0–18.0)
Lymphocytes Relative: 14 %
Lymphs Abs: 1.3 10*3/uL (ref 1.0–3.6)
MCH: 22.7 pg — ABNORMAL LOW (ref 26.0–34.0)
MCHC: 30.6 g/dL — AB (ref 32.0–36.0)
MCV: 74.4 fL — ABNORMAL LOW (ref 80.0–100.0)
Monocytes Absolute: 0.9 10*3/uL (ref 0.2–1.0)
Neutro Abs: 6.3 10*3/uL (ref 1.4–6.5)
Neutrophils Relative %: 73 %
PLATELETS: 262 10*3/uL (ref 150–440)
RBC: 5.35 MIL/uL (ref 4.40–5.90)
RDW: 23.1 % — AB (ref 11.5–14.5)
WBC: 8.7 10*3/uL (ref 3.8–10.6)

## 2015-10-27 LAB — EXPECTORATED SPUTUM ASSESSMENT W GRAM STAIN, RFLX TO RESP C

## 2015-10-27 LAB — COMPREHENSIVE METABOLIC PANEL
ALK PHOS: 153 U/L — AB (ref 38–126)
ALT: 20 U/L (ref 17–63)
AST: 18 U/L (ref 15–41)
Albumin: 2.8 g/dL — ABNORMAL LOW (ref 3.5–5.0)
Anion gap: 7 (ref 5–15)
BUN: 30 mg/dL — ABNORMAL HIGH (ref 6–20)
CALCIUM: 7.9 mg/dL — AB (ref 8.9–10.3)
CO2: 24 mmol/L (ref 22–32)
CREATININE: 1.55 mg/dL — AB (ref 0.61–1.24)
Chloride: 105 mmol/L (ref 101–111)
GFR, EST AFRICAN AMERICAN: 50 mL/min — AB (ref >60)
GFR, EST NON AFRICAN AMERICAN: 43 mL/min — AB (ref >60)
Glucose, Bld: 114 mg/dL — ABNORMAL HIGH (ref 65–99)
Potassium: 4.1 mmol/L (ref 3.5–5.1)
Sodium: 136 mmol/L (ref 135–145)
Total Bilirubin: 0.7 mg/dL (ref 0.3–1.2)
Total Protein: 5.9 g/dL — ABNORMAL LOW (ref 6.5–8.1)

## 2015-10-27 LAB — STREP PNEUMONIAE URINARY ANTIGEN: Strep Pneumo Urinary Antigen: NEGATIVE

## 2015-10-27 LAB — LACTIC ACID, PLASMA: LACTIC ACID, VENOUS: 1 mmol/L (ref 0.5–2.0)

## 2015-10-27 LAB — EXPECTORATED SPUTUM ASSESSMENT W REFEX TO RESP CULTURE

## 2015-10-27 MED ORDER — LEVOFLOXACIN 750 MG PO TABS
750.0000 mg | ORAL_TABLET | ORAL | Status: DC
  Administered 2015-10-27: 750 mg via ORAL
  Filled 2015-10-27: qty 1

## 2015-10-27 MED ORDER — ACETAMINOPHEN 325 MG PO TABS: 650.0000 mg | ORAL_TABLET | Freq: Four times a day (QID) | ORAL | Status: DC | PRN

## 2015-10-27 MED ORDER — GUAIFENESIN-DM 100-10 MG/5ML PO SYRP
5.0000 mL | ORAL_SOLUTION | ORAL | Status: DC | PRN
  Administered 2015-10-27 – 2015-10-29 (×4): 5 mL via ORAL
  Filled 2015-10-27 (×4): qty 5

## 2015-10-27 MED ORDER — POLYETHYLENE GLYCOL 3350 17 G PO PACK
17.0000 g | PACK | Freq: Every day | ORAL | Status: DC | PRN
  Administered 2015-10-27: 17 g via ORAL
  Filled 2015-10-27: qty 1

## 2015-10-27 NOTE — Evaluation (Signed)
Occupational Therapy Evaluation Patient Details Name: Collin [name] MRN: [id number] DOB: February 25, 1941 Today's Date: 10/27/2015    History of Present Illness Collin [name] is a 75 year old male who complains of worsening shortness of breath and productive cough over the past 2-3 days. He is on 2 L home oxygen at home but feels like he can't catch his breath and has a hard time breathing back out when he does take a breath. He also was recently treated for cellulitis on his left foot with Keflex but even after completing the course of Keflex notes that the redness and swelling of the left foot are worsening as well as the pain. Denies any chest pain but does note pain in his right lower ribs when he coughs.   Clinical Impression   This patient is a 75 year old male who came to [facility] with the above history. He lives in a mobile home with his wife and had been independent with basic ADL and functional mobility using a single point cane. He now has deficits in endurance, mobility, and activities of daily living getting shortness of breath with lower body dressing. He declined getting up today but willing to get instruction on energy saving techniques. He would benefit from Occupational Therapy for ADL/functional mobility training and energy saving techniques.     Follow Up Recommendations       Equipment Recommendations       Recommendations for Other Services       Precautions / Restrictions Restrictions Weight Bearing Restrictions: No      Mobility Bed Mobility                  Transfers                 General transfer comment: Declined    Balance                                            ADL                                         General ADL Comments: Independent with BADL, assist with IADL using single point cane. Uses chronic O2 (sais he takes brakes from it). Instructed patient regarding using  hip kit for lower body dressing to prevent/reduce shortness of breath. Reviewed energy saving techniques as well.      Vision     Perception     Praxis      Pertinent Vitals/Pain       Hand Dominance Right   Extremity/Trunk Assessment Upper Extremity Assessment Upper Extremity Assessment:  (B UE 5/5 grip R 67 lbs L 60 lbs, however, endurance is low.)   Lower Extremity Assessment Lower Extremity Assessment: Defer to PT evaluation       Communication Communication Communication: No difficulties   Cognition Arousal/Alertness: Awake/alert Behavior During Therapy: WFL for tasks assessed/performed Overall Cognitive Status: Within Functional Limits for tasks assessed                     General Comments       Exercises       Shoulder Instructions      Home Living Family/patient expects to be discharged to:: Private residence Living  Arrangements: Spouse/significant other Available Help at Discharge: Family Type of Home: Mobile home Home Access: Stairs to enter Entrance Stairs-Number of Steps: 5 Entrance Stairs-Rails: Can reach both;Left;Right Home Layout: One level               Home Equipment: Walker - 2 wheels;Cane - single point;Bedside commode;Shower seat - built in;Toilet riser          Prior Functioning/Environment Level of Independence: Independent with assistive device(s)        Comments: Community ambulator with SPC and Supplemental O2.     OT Diagnosis: Generalized weakness (low endurance.)   OT Problem List: Decreased activity tolerance;Decreased knowledge of use of DME or AE;Pain   OT Treatment/Interventions: Self-care/ADL training    OT Goals(Current goals can be found in the care plan section) Acute Rehab OT Goals Patient Stated Goal: to go home OT Goal Formulation: With patient/family Time For Goal Achievement: 11/10/15 Potential to Achieve Goals: Good  OT Frequency: Min 1X/week   Barriers to D/C:             Co-evaluation              End of Session Equipment Utilized During Treatment:  (hip kit, dynomometer.)  Activity Tolerance: Patient limited by fatigue;Patient limited by pain Patient left: in bed;with call bell/phone within reach;with bed alarm set;with family/visitor present   Time: 1414-1430 OT Time Calculation (min): 16 min Charges:  OT General Charges $OT Visit: 1 Procedure OT Evaluation $OT Eval Low Complexity: 1 Procedure G-Codes:    Myrene Galas, MS/OTR/L  10/27/2015, 2:36 PM

## 2015-10-27 NOTE — Consult Note (Signed)
Patient Demographics  Collin [name], is a 75 y.o. male   MRN: [id number]   DOB - 11-Dec-1940  Admit Date - 10/26/2015    Outpatient Primary MD for the patient is [name], MD  Consult requested in the Hospital by Henreitta Leber, MD, On 10/27/2015    Reason for consult discoloration and vascular changes to the left fifth toe.   With History of -  Past Medical History  Diagnosis Date  . COPD (chronic obstructive pulmonary disease) ([place])   . Chronic systolic CHF (congestive heart failure) ([place])   . Kidney stone   . Macular degeneration   . HLD (hyperlipidemia)   . Hypertensive retinopathy   . HTN (hypertension)   . GERD (gastroesophageal reflux disease)       Past Surgical History  Procedure Laterality Date  . Abdominal aortic aneurysm repair    . Hernia repair    . Kidney stone surgery    . Chest tube insertion Right 07/07/2015    Procedure: INSERTION PLEURAL DRAINAGE CATHETER;  Surgeon: Nestor Lewandowsky, MD;  Location: [facility] ORS;  Service: Thoracic;  Laterality: Right;    in for   Chief Complaint  Patient presents with  . Shortness of Breath     HPI  Collin [name]  is a 75 y.o. male, states he dropped a can of Kinnie Scales corral on the toe about a month ago and has been somewhat sore and inflamed since that time frame. She had multiple other medical problems including renal issues and pulmonary issues during that time frame as well. Admitted hospital at this point with noted pain to his left foot also scheduled to have a renal surgery on Friday. Was seen by vascular today and will likely have an angioplasty done on Monday of next week.    Review of Systems    In addition to the HPI above,  No Fever-chills, No Headache, No changes with Vision or hearing, No problems swallowing food or Liquids, No Chest pain,  Cough or Shortness of Breath, No Abdominal pain, No Nausea or Vommitting, Bowel movements are regular, No Blood in stool or Urine, No dysuria, No new skin rashes or bruises, No new joints pains-aches,  No new weakness, tingling, numbness in any extremity, No recent weight gain or loss, No polyuria, polydypsia or polyphagia, No significant Mental Stressors.  A full 10 point Review of Systems was done, except as stated above, all other Review of Systems were negative.   Social History Social History  Substance Use Topics  . Smoking status: Current Every Day Smoker -- 0.25 packs/day for 45 years    Types: Cigarettes  . Smokeless tobacco: Never Used  . Alcohol Use: No     Family History Family History  Problem Relation Age of Onset  . Heart attack Mother   . Stroke Mother   . Cancer Father   . COPD Father   . Heart attack Father     Prior to Admission medications   Medication Sig Start Date End Date Taking? Authorizing Provider  albuterol (PROVENTIL HFA;VENTOLIN HFA) 108 (90 BASE) MCG/ACT inhaler Inhale 1-2 puffs into the lungs every 4 (four) hours as needed for wheezing or shortness  of breath.    Yes Historical Provider, MD  ALPRAZolam Duanne Moron) 0.25 MG tablet Take 0.25 mg by mouth at bedtime as needed for anxiety or sleep.    Yes Historical Provider, MD  aspirin EC 81 MG tablet Take 81 mg by mouth daily.   Yes Historical Provider, MD  carvedilol (COREG) 6.25 MG tablet Take 6.25 mg by mouth 2 (two) times daily.    Yes Historical Provider, MD  Fluticasone-Salmeterol (ADVAIR) 100-50 MCG/DOSE AEPB Inhale 1 puff into the lungs 2 (two) times daily.   Yes Historical Provider, MD  furosemide (LASIX) 40 MG tablet Take 40 mg by mouth 3 (three) times daily.   Yes Historical Provider, MD  Multiple Vitamin (MULTIVITAMIN WITH MINERALS) TABS tablet Take 1 tablet by mouth daily.   Yes Historical Provider, MD  nitroGLYCERIN (NITROSTAT) 0.4 MG SL tablet Take 0.4 mg by mouth every 5 (five)  minutes x 3 doses as needed for chest pain.    Yes Historical Provider, MD  omeprazole (PRILOSEC) 20 MG capsule Take 20 mg by mouth 2 (two) times daily.    Yes Historical Provider, MD  oxyCODONE-acetaminophen (ROXICET) 5-325 MG tablet Take 1 tablet by mouth every 6 (six) hours as needed. Patient taking differently: Take 1 tablet by mouth every 6 (six) hours as needed for severe pain.  10/08/15  Yes Harvest Dark, MD  tiotropium (SPIRIVA HANDIHALER) 18 MCG inhalation capsule Place 1 capsule (18 mcg total) into inhaler and inhale daily. 03/06/15  Yes Hillary Bow, MD    Anti-infectives    Start     Dose/Rate Route Frequency Ordered Stop   10/27/15 1800  levofloxacin (LEVAQUIN) IVPB 750 mg  Status:  Discontinued     750 mg 100 mL/hr over 90 Minutes Intravenous Every 24 hours 10/26/15 2208 10/27/15 1540   10/27/15 1800  levofloxacin (LEVAQUIN) tablet 750 mg     750 mg Oral Every 48 hours 10/27/15 1540     10/27/15 0200  vancomycin (VANCOCIN) 1,250 mg in sodium chloride 0.9 % 250 mL IVPB  Status:  Discontinued     1,250 mg 166.7 mL/hr over 90 Minutes Intravenous Every 18 hours 10/26/15 2222 10/27/15 1517   10/27/15 0000  piperacillin-tazobactam (ZOSYN) IVPB 3.375 g     3.375 g 12.5 mL/hr over 240 Minutes Intravenous 3 times per day 10/26/15 2208     10/26/15 1545  vancomycin (VANCOCIN) 1,250 mg in sodium chloride 0.9 % 250 mL IVPB     1,250 mg 166.7 mL/hr over 90 Minutes Intravenous  Once 10/26/15 1543 10/26/15 1808   10/26/15 1545  piperacillin-tazobactam (ZOSYN) IVPB 3.375 g     3.375 g 12.5 mL/hr over 240 Minutes Intravenous  Once 10/26/15 1543 10/26/15 2000   10/26/15 1545  levofloxacin (LEVAQUIN) IVPB 750 mg     750 mg 100 mL/hr over 90 Minutes Intravenous  Once 10/26/15 1543 10/26/15 2005      Scheduled Meds: . aspirin EC  81 mg Oral Daily  . carvedilol  6.25 mg Oral BID  . furosemide  40 mg Oral TID  . heparin  5,000 Units Subcutaneous 3 times per day  .  ipratropium-albuterol  3 mL Nebulization Q6H  . levofloxacin  750 mg Oral Q48H  . mometasone-formoterol  2 puff Inhalation BID  . multivitamin with minerals  1 tablet Oral Daily  . pantoprazole  40 mg Oral Daily  . piperacillin-tazobactam  3.375 g Intravenous 3 times per day  . tiotropium  18 mcg Inhalation Daily   Continuous  Infusions:  PRN Meds:.acetaminophen, albuterol, ALPRAZolam, guaiFENesin-dextromethorphan, HYDROmorphone (DILAUDID) injection, nitroGLYCERIN, oxyCODONE-acetaminophen  Allergies  Allergen Reactions  . Lisinopril Anaphylaxis  . Metoprolol Other (See Comments)    Reaction:  Ringing in ears     Physical Exam  Vitals  Blood pressure 125/88, pulse 103, temperature 97.7 F (36.5 C), temperature source Oral, resp. rate 17, height 6\' 2"  (1.88 m), weight 81.647 kg (180 lb), SpO2 99 %.  Lower Extremity exam:  Vascular: DP pulses are nonpalpable to the left foot. Posterior tibial pulse is nonpalpable also. Significant color changes to the left foot. Toes have a dependent rubor or elevation pallor consistent with peripheral tear disease. Fifth toe has some discoloration dorsally and centrally which may be necrotic tissue but that does not appear to be completely gangrenous at this point. Dermatological: some necrotic tissue on the dorsal fifth toe but does not appear to be gangrenous. Some erythema and redness to the dorsum of the left foot which is partially cellulitic partially vascular changes neurological: Patient appears to be fully sensate with foot.   Data Review  CBC  Recent Labs Lab 10/26/15 1223 10/27/15 0526  WBC 9.4 8.7  HGB 12.9* 12.2*  HCT 43.2 39.8*  PLT 318 262  MCV 75.1* 74.4*  MCH 22.4* 22.7*  MCHC 29.8* 30.6*  RDW 23.2* 23.1*  LYMPHSABS  --  1.3  MONOABS  --  0.9  EOSABS  --  0.1  BASOSABS  --  0.1   ------------------------------------------------------------------------------------------------------------------  Chemistries   Recent  Labs Lab 10/26/15 1223 10/27/15 0526  NA 139 136  K 4.3 4.1  CL 104 105  CO2 28 24  GLUCOSE 150* 114*  BUN 29* 30*  CREATININE 1.44* 1.55*  CALCIUM 8.3* 7.9*  AST 30 18  ALT 21 20  ALKPHOS 168* 153*  BILITOT 0.8 0.7   ------------------------------------------------------------------------------------------------------------------ estimated creatinine clearance is 49 mL/min (by C-G formula based on Cr of 1.55). ------------------------------------------------------------------------------------------------------------------ No results for input(s): TSH, T4TOTAL, T3FREE, THYROIDAB in the last 72 hours.  Invalid input(s): FREET3   Coagulation profile No results for input(s): INR, PROTIME in the last 168 hours. ------------------------------------------------------------------------------------------------------------------- No results for input(s): DDIMER in the last 72 hours. -------------------------------------------------------------------------------------------------------------------  Cardiac Enzymes  Recent Labs Lab 10/26/15 1223  TROPONINI 0.03   ------------------------------------------------------------------------------------------------------------------ Invalid input(s): POCBNP   ---------------------------------------------------------------------------------------------------------------  Urinalysis    Component Value Date/Time   COLORURINE YELLOW* 10/01/2015 1558   COLORURINE Yellow 08/15/2014 0745   APPEARANCEUR CLOUDY* 10/01/2015 1558   APPEARANCEUR Clear 08/15/2014 0745   LABSPEC 1.018 10/01/2015 1558   LABSPEC 1.015 08/15/2014 0745   PHURINE 6.0 10/01/2015 1558   PHURINE 6.0 08/15/2014 0745   GLUCOSEU NEGATIVE 10/01/2015 1558   GLUCOSEU 50 mg/dL 08/15/2014 0745   HGBUR 2+* 10/01/2015 1558   HGBUR 2+ 08/15/2014 0745   BILIRUBINUR NEGATIVE 10/01/2015 1558   BILIRUBINUR Negative 08/15/2014 0745   KETONESUR NEGATIVE 10/01/2015 1558    KETONESUR Negative 08/15/2014 0745   PROTEINUR 100* 10/01/2015 1558   PROTEINUR 100 mg/dL 08/15/2014 0745   NITRITE NEGATIVE 10/01/2015 1558   NITRITE Negative 08/15/2014 0745   LEUKOCYTESUR 3+* 10/01/2015 1558   LEUKOCYTESUR Trace 08/15/2014 0745     Imaging results:   Dg Chest Portable 1 View  10/26/2015  CLINICAL DATA:  Shortness of breath and cough, worsening over the past few days. EXAM: PORTABLE CHEST 1 VIEW COMPARISON:  PA and lateral chest 10/08/2015. FINDINGS: The patient's right pleural effusion is increased since the prior examination. There appears to be pleural drain  in place. Trace left pleural effusion is noted. Basilar airspace disease is seen on the right. There is cardiomegaly and vascular congestion. IMPRESSION: Increased right pleural effusion and basilar airspace disease. The patient appears to have a pleural drain in place. Trace left pleural effusion. Cardiomegaly and vascular congestion. Electronically Signed   By: Inge Rise M.D.   On: 10/26/2015 14:45   Dg Foot 2 Views Left  10/26/2015  CLINICAL DATA:  75 year old male with cellulitis and possible small toe necrosis EXAM: LEFT FOOT - 2 VIEW COMPARISON:  None. FINDINGS: There is no evidence of fracture or dislocation. There is no evidence of arthropathy or other focal bone abnormality. Soft tissues are unremarkable. IMPRESSION: Negative. No conventional radiographic evidence of osteomyelitis. Electronically Signed   By: Jacqulynn Cadet M.D.   On: 10/26/2015 16:34   Assessment & Plan: Patient has likely significant peripheral arterial disease and has been evaluated by vascular for a needed angioplasty to the left lower extremity. These situation is complicated because of renal stone issues to the point of causing potential renal failure, chronic respiratory issues. Would not recommend any type of surgical intervention with the foot until vascular has a chance to  angioplasty and see if vascular reconstruction can be  achieved. All toes are struggling some with arterial blood flow but fifth toe is the worst at this juncture. Plan: We'll discuss findings with Dr. Leotis Pain tomorrow. We'll discuss course of care and approach to treating this left foot. Right now there is not much will be able to do for the foot until revascularization.  Principal Problem:   Pneumonia Active Problems:   Recurrent right pleural effusion   COPD (chronic obstructive pulmonary disease) (H[facility])   HTN (hypertension)   Sepsis ([facility])   Chronic respiratory failure with hypoxia ([facility])   Cellulitis of left foot   Gangrenous toe ([place])     Family Communication: Plan discussed with patient and **   Thank you for the consult, we will follow the patient with you in the Hospital.   Perry Mount M.D on 10/27/2015 at 5:44 PM  Thank you for the consult, we will follow the patient with you in the Hospital.

## 2015-10-27 NOTE — Progress Notes (Signed)
PT Cancellation Note  Patient Details Name: SYAN KRASS MRN: [id number] DOB: 03-25-1941   Cancelled Treatment:    Reason Eval/Treat Not Completed: Patient declined, no reason specified.  Pt refused PT stating he was having a good rest break in the hospital and that nursing could assist him with his mobility needs.   Mittie Bodo, SPT Mittie Bodo 10/27/2015, 3:04 PM

## 2015-10-27 NOTE — Progress Notes (Signed)
Pharmacy Antibiotic Note  Collin [name] is a 75 y.o. male admitted on 10/26/2015 with pneumonia.  Pharmacy has been consulted for vancomycin dosing.  Plan: Vancomycin 1.25 gm IV Q18H with stacked dosing, second dose approximately 9 hours after first, predicted trough 16 mcg/mL. Pharmacy will continue to follow and adjust as needed to maintain trough 15 to 20 mcg/mL. Vd 57.1 L, Ke 0.048 hr-1, T1/2 14.4 hr.  2/15:  SCr 1.44>1.55, Continue vancomycin 1250 mg IV q18h (Ke 0.045, half life 15.4, expected trough ~18). Will need to continue to monitor renal function closely - will order SCr for AM. If SCr worsens, may warrant dose adjustment. Trough ordered for 2/17 at 0730.   2/15 PM: discussed with Dr. Verdell Carmine, will d/c vanc   Height: 6\' 2"  (188 cm) Weight: 180 lb (81.647 kg) IBW/kg (Calculated) : 82.2  Temp (24hrs), Avg:97.5 F (36.4 C), Min:97.3 F (36.3 C), Max:97.7 F (36.5 C)   Recent Labs Lab 10/26/15 1223 10/27/15 0022 10/27/15 0526  WBC 9.4  --  8.7  CREATININE 1.44*  --  1.55*  LATICACIDVEN  --  1.0  --     Estimated Creatinine Clearance: 49 mL/min (by C-G formula based on Cr of 1.55).    Allergies  Allergen Reactions  . Lisinopril Anaphylaxis  . Metoprolol Other (See Comments)    Reaction:  Ringing in ears      Thank you for allowing pharmacy to be a part of this patient's care.  Rocky Morel, Pharm.D., BCPS Clinical Pharmacist 10/27/2015 3:40 PM

## 2015-10-27 NOTE — Care Management Note (Signed)
Case Management Note  Patient Details  Name: Collin [name] MRN: [id number] Date of Birth: 03-20-1941  Subjective/Objective:                  Met with patient to discuss discharge planning. Patient was closed to [facility] care back in December 2016. He is from home with his wife. He states he drives and usually independent with mobility however uses a cane to ambulate outside the home. He states his household income is $1395/month. He gets $57 in food stamps. He uses Oncologist for Rx. He has Medicaid in addition to Medicare. He is on chronic O2 through [facility]. He declined COPD Gold. His PCP is Dr. Netty Starring with [facility]. He has a chest tube and nephrostomy tube that his wife helps to manage.   Action/Plan: RNCM will continue to follow.   Expected Discharge Date:  10/29/15               Expected Discharge Plan:     In-House Referral:     Discharge planning Services     Post Acute Care Choice:    Choice offered to:  Patient  DME Arranged:    DME Agency:     HH Arranged:    [place] Agency:     Status of Service:  In process, will continue to follow  Medicare Important Message Given:    Date Medicare IM Given:    Medicare IM give by:    Date Additional Medicare IM Given:    Additional Medicare Important Message give by:     If discussed at [place] of Stay Meetings, dates discussed:    Additional Comments:  Marshell Garfinkel, RN 10/27/2015, 10:35 AM

## 2015-10-27 NOTE — Progress Notes (Signed)
Date: 10/27/2015,   MRN# [id number] Collin [name] 1941/02/28 Code Status:     Code Status Orders        Start     Ordered   10/26/15 2209  Full code   Continuous     10/26/15 2208    Code Status History    Date Active Date Inactive Code Status Order ID Comments User Context   08/22/2015  5:44 PM 08/31/2015  4:39 PM Full Code [id number]  Theodoro Grist, MD Inpatient   07/07/2015  1:57 PM 07/08/2015  2:28 PM Full Code [id number]  Nestor Lewandowsky, MD Inpatient   06/12/2015  3:54 PM 06/14/2015  5:01 PM Full Code [id number]  Baxter Hire, MD Inpatient   03/20/2015 12:20 AM 03/21/2015  9:25 PM Full Code [id number]  Lance Coon, MD ED   03/04/2015  5:52 AM 03/06/2015  2:26 PM Full Code [id number]  Lance Coon, MD Inpatient     Hosp day:@LENGTHOFSTAYDAYS @ Referring MD: @ATDPROV @     PCP:      AdmissionWeight: 180 lb (81.647 kg)                 CurrentWeight: 180 lb (81.647 kg)  CC: sob, pneumonia, recurrent right pleural effusion  HPI: This is a 75 yr old male, well known to my service. See kc notes. Came in with sob, thick yellow sputum, cough and red left foot after a can fell on the foot. He is still short of breath but better. Chest xray suggest pneumonia. On zosyn and levaquin. He has a chronic indwelling pleurovax catheter. To be drain today. He want it out. Advised to keep it in. He was deemed not a good candidate due to his cardiac status for pleurodesis via vats.   PMHX:   Past Medical History  Diagnosis Date  . COPD (chronic obstructive pulmonary disease) ([place])   . Chronic systolic CHF (congestive heart failure) ([place])   . Kidney stone   . Macular degeneration   . HLD (hyperlipidemia)   . Hypertensive retinopathy   . HTN (hypertension)   . GERD (gastroesophageal reflux disease)    Surgical Hx:  Past Surgical History  Procedure Laterality Date  . Abdominal aortic aneurysm repair    . Hernia repair    . Kidney stone surgery    . Chest tube insertion Right 07/07/2015   Procedure: INSERTION PLEURAL DRAINAGE CATHETER;  Surgeon: Nestor Lewandowsky, MD;  Location: [facility] ORS;  Service: Thoracic;  Laterality: Right;   Family Hx:  Family History  Problem Relation Age of Onset  . Heart attack Mother   . Stroke Mother   . Cancer Father   . COPD Father   . Heart attack Father    Social Hx:   Social History  Substance Use Topics  . Smoking status: Current Every Day Smoker -- 0.25 packs/day for 45 years    Types: Cigarettes  . Smokeless tobacco: Never Used  . Alcohol Use: No   Medication:    Home Medication:  No current outpatient prescriptions on file.  Current Medication: @CURMEDTAB @   Allergies:  Lisinopril and Metoprolol  Review of Systems: Gen:  Denies  fever, sweats, chills HEENT: Denies blurred vision, double vision, ear pain, eye pain, hearing loss, nose bleeds, sore throat Cvc:  No dizziness, chest pain or heaviness Resp: Cough, sob, wheezing, no blood   Gi: Denies swallowing difficulty, stomach pain, nausea or vomiting, diarrhea, constipation, bowel incontinence Gu:  Denies bladder incontinence, burning urine Ext:   No Joint pain,  stiffness or swelling Skin: No skin rash, easy bruising or bleeding or hives Endoc:  No polyuria, polydipsia , polyphagia or weight change Psych: No depression, insomnia or hallucinations  Other:  All other systems negative  Physical Examination:   VS: BP 126/86 mmHg  Pulse 110  Temp(Src) 97.3 F (36.3 C) (Oral)  Resp 22  Ht 6\' 2"  (1.88 m)  Wt 180 lb (81.647 kg)  BMI 23.10 kg/m2  SpO2 96%  General Appearance: No distress, sitting at the side of the bed.   Neuro/psych: without focal findings, mental status, speech normal, alert and oriented, cranial nerves 2-12 intact, reflexes normal and symmetric, sensation grossly normal  HEENT: PERRLA, EOM intact, no ptosis, no other lesions noticed NECK: Supple, no stridor Pulmonary:.rare wheezing, No rales  Decrease bs at the base  Cardiovascular:  Normal S1,S2.  No  m/r/g.  Abdominal aorta pulsation normal.    Abdomen:Benign, Soft, non-tender, No masses, hepatosplenomegaly, No lymphadenopathy Endoc: No evident thyromegaly, no signs of acromegaly or Cushing features Skin:   warm, no rashes, no ecchymosis  Extremities: normal, no cyanosis, clubbing, left foot is some what swollen, increase . Other findings:   Labs results:   Recent Labs     10/26/15  1223  10/27/15  0526  HGB  12.9*  12.2*  HCT  43.2  39.8*  MCV  75.1*  74.4*  WBC  9.4  8.7  BUN  29*  30*  CREATININE  1.44*  1.55*  GLUCOSE  150*  114*  CALCIUM  8.3*  7.9*  ,    No results for input(s): PH in the last 72 hours.  Invalid input(s): PCO2, PO2, BASEEXCESS, BASEDEFICITE, TFT  Culture results:     Rad results:   Dg Chest Portable 1 View  10/26/2015  CLINICAL DATA:  Shortness of breath and cough, worsening over the past few days. EXAM: PORTABLE CHEST 1 VIEW COMPARISON:  PA and lateral chest 10/08/2015. FINDINGS: The patient's right pleural effusion is increased since the prior examination. There appears to be pleural drain in place. Trace left pleural effusion is noted. Basilar airspace disease is seen on the right. There is cardiomegaly and vascular congestion. IMPRESSION: Increased right pleural effusion and basilar airspace disease. The patient appears to have a pleural drain in place. Trace left pleural effusion. Cardiomegaly and vascular congestion. Electronically Signed   By: Inge Rise M.D.   On: 10/26/2015 14:45   Dg Foot 2 Views Left  10/26/2015  CLINICAL DATA:  75 year old male with cellulitis and possible small toe necrosis EXAM: LEFT FOOT - 2 VIEW COMPARISON:  None. FINDINGS: There is no evidence of fracture or dislocation. There is no evidence of arthropathy or other focal bone abnormality. Soft tissues are unremarkable. IMPRESSION: Negative. No conventional radiographic evidence of osteomyelitis. Electronically Signed   By: Jacqulynn Cadet M.D.   On: 10/26/2015  16:34      Assessment and Plan: Known hx of recurrent right pleural effusion,  Right pleurovax in place. Here with pneumonia, left foot cellulitis and chf. (ef < 30 % )  Plan -levaquin, zosyn -resume home meds -continue schedule of drainage of pleurovac -    I have personally obtained a history, examined the patient, evaluated laboratory and imaging results, formulated the assessment and plan and placed orders.  The Patient requires high complexity decision making for assessment and support, frequent evaluation and titration of therapies, application of advanced monitoring technologies and extensive interpretation of multiple databases.   Hailey Stormer,M.D. Pulmonary & Critical  care Medicine [facility]

## 2015-10-27 NOTE — Progress Notes (Signed)
Initial Nutrition Assessment   INTERVENTION:   Meals and Snacks: Cater to patient preferences Medical Food Supplement Therapy: will send Magic Cup on all meal trays as pt reports not liking Boost/Ensure.    NUTRITION DIAGNOSIS:   Increased nutrient needs related to acute illness as evidenced by estimated needs.  GOAL:   Patient will meet greater than or equal to 90% of their needs  MONITOR:    (Energy Intake, Electrolyte and renal Profile, Anthropometrics, Digestive system, Pulmonary Profile)  REASON FOR ASSESSMENT:   Consult Assessment of nutrition requirement/status  ASSESSMENT:   Pt admitted with SOB secondary to pna with sepsis. Pt also with left foot cellulitis.   Past Medical History  Diagnosis Date  . COPD (chronic obstructive pulmonary disease) ([place])   . Chronic systolic CHF (congestive heart failure) ([place])   . Kidney stone   . Macular degeneration   . HLD (hyperlipidemia)   . Hypertensive retinopathy   . HTN (hypertension)   . GERD (gastroesophageal reflux disease)     Diet Order:  Diet Heart Room service appropriate?: Yes; Fluid consistency:: Thin    Current Nutrition: Pt reports eating eggs, home fries, oatmeal and peaches this am for breakfast with a good appetite.   Food/Nutrition-Related History: Pt reports very good appetite PTA eating 3 meals per day with snacks. Pt reports trying to gain weight and therefore has been eating more than usual. Pt reports trying Ensure and not liking.   Scheduled Medications:  . aspirin EC  81 mg Oral Daily  . carvedilol  6.25 mg Oral BID  . furosemide  40 mg Oral TID  . heparin  5,000 Units Subcutaneous 3 times per day  . ipratropium-albuterol  3 mL Nebulization Q6H  . levofloxacin (LEVAQUIN) IV  750 mg Intravenous Q24H  . mometasone-formoterol  2 puff Inhalation BID  . multivitamin with minerals  1 tablet Oral Daily  . pantoprazole  40 mg Oral Daily  . piperacillin-tazobactam  3.375 g Intravenous 3 times per  day  . tiotropium  18 mcg Inhalation Daily  . vancomycin  1,250 mg Intravenous Q18H    Electrolyte/Renal Profile and Glucose Profile:   Recent Labs Lab 10/26/15 1223 10/27/15 0526  NA 139 136  K 4.3 4.1  CL 104 105  CO2 28 24  BUN 29* 30*  CREATININE 1.44* 1.55*  CALCIUM 8.3* 7.9*  GLUCOSE 150* 114*   Protein Profile:  Recent Labs Lab 10/26/15 1223 10/27/15 0526  ALBUMIN 3.0* 2.8*    Gastrointestinal Profile: Last BM:  10/26/2015   Nutrition-Focused Physical Exam Findings: Nutrition-Focused physical exam completed. Findings are no fat depletion, mild-moderate muscle depletion of lower extremities, and no edema.     Weight Change: Pt reports trying to gain weight. Pt reports 235lbs >2 years ago. Pt reports recently his weight has been 178lbs. Per [facility] weight encounters pt weight fluctuates greatly.  Height:   Ht Readings from Last 1 Encounters:  10/26/15 6\' 2"  (1.88 m)    Weight:   Wt Readings from Last 1 Encounters:  10/26/15 180 lb (81.647 kg)   Wt Readings from Last 10 Encounters:  10/26/15 180 lb (81.647 kg)  10/08/15 187 lb (84.823 kg)  10/01/15 210 lb (95.255 kg)  09/17/15 197 lb (89.359 kg)  08/31/15 212 lb 12.8 oz (96.525 kg)  07/15/15 179 lb 7.3 oz (81.4 kg)  07/07/15 183 lb (83.008 kg)  07/05/15 183 lb 5 oz (83.15 kg)  06/13/15 171 lb 12.8 oz (77.928 kg)  06/06/15 179 lb (  81.194 kg)    BMI:  Body mass index is 23.1 kg/(m^2).  Estimated Nutritional Needs:   Kcal:  BEE: 1506kcals, TEE: (IF 1.1-1.3)(AF 1.2) 1988-2345kcals  Protein:  76-91g protein (1.0-1.2g/kg)  Fluid:  1900-2256mL of fluid (25-54mL/kg)  EDUCATION NEEDS:   No education needs identified at this time   [place], RD, LDN Pager [phone] Weekend/On-Call Pager [phone]

## 2015-10-27 NOTE — Progress Notes (Signed)
Pharmacy Antibiotic Note  Collin [name] is a 75 y.o. male admitted on 10/26/2015 with pneumonia.  Pharmacy has been consulted for vancomycin dosing.  Plan: Vancomycin 1.25 gm IV Q18H with stacked dosing, second dose approximately 9 hours after first, predicted trough 16 mcg/mL. Pharmacy will continue to follow and adjust as needed to maintain trough 15 to 20 mcg/mL. Vd 57.1 L, Ke 0.048 hr-1, T1/2 14.4 hr.  2/15:  SCr 1.44>1.55, Continue vancomycin 1250 mg IV q18h (Ke 0.045, half life 15.4, expected trough ~18). Will need to continue to monitor renal function closely - will order SCr for AM. If SCr worsens, may warrant dose adjustment. Trough ordered for 2/17 at 0730.    Height: 6\' 2"  (188 cm) Weight: 180 lb (81.647 kg) IBW/kg (Calculated) : 82.2  Temp (24hrs), Avg:97.5 F (36.4 C), Min:97.3 F (36.3 C), Max:97.7 F (36.5 C)   Recent Labs Lab 10/26/15 1223 10/27/15 0022 10/27/15 0526  WBC 9.4  --  8.7  CREATININE 1.44*  --  1.55*  LATICACIDVEN  --  1.0  --     Estimated Creatinine Clearance: 49 mL/min (by C-G formula based on Cr of 1.55).    Allergies  Allergen Reactions  . Lisinopril Anaphylaxis  . Metoprolol Other (See Comments)    Reaction:  Ringing in ears      Thank you for allowing pharmacy to be a part of this patient's care.  Rocky Morel, Pharm.D., BCPS Clinical Pharmacist 10/27/2015 10:06 AM

## 2015-10-27 NOTE — Consult Note (Signed)
[facility] Vascular Consult Note  MRN : [id number]  Collin [name] is a 75 y.o. (06-23-1941) male who presents with chief complaint of  Chief Complaint  Patient presents with  . Shortness of Breath  .  History of Present Illness: Patient admitted with shortness or breath and found to have gangrene of left second toe.  I am asked by the Hospitalist service to see the patient regarding this.  He dropped something on this toe about a month ago and it has steadily worsened.  It is painful and the pain is severe and unrelenting.  He had a previous ulcer on the left heel that was slow to heal.  He has a previous AAA repair years ago, but no stents or bypass surgery to his legs. He reports no right leg symptoms.  He dangles his legs off the bed due to the pain.  He has many other ongoing issues and needs a urologic procedure later this week.  Current Facility-Administered Medications  Medication Dose Route Frequency Provider Last Rate Last Dose  . albuterol (PROVENTIL) (2.5 MG/3ML) 0.083% nebulizer solution 2.5 mg  2.5 mg Inhalation Q4H PRN Adaorah E Okafor, DO      . ALPRAZolam Duanne Moron) tablet 0.25 mg  0.25 mg Oral QHS PRN Samson Frederic, DO   0.25 mg at 10/26/15 2226  . aspirin EC tablet 81 mg  81 mg Oral Daily Adaorah E Okafor, DO   81 mg at 10/27/15 0841  . carvedilol (COREG) tablet 6.25 mg  6.25 mg Oral BID Adaorah E Okafor, DO   6.25 mg at 10/27/15 0841  . furosemide (LASIX) tablet 40 mg  40 mg Oral TID Samson Frederic, DO   40 mg at 10/27/15 0841  . guaiFENesin-dextromethorphan (ROBITUSSIN DM) 100-10 MG/5ML syrup 5 mL  5 mL Oral Q4H PRN Lance Coon, MD   5 mL at 10/27/15 0233  . heparin injection 5,000 Units  5,000 Units Subcutaneous 3 times per day Samson Frederic, DO   5,000 Units at 10/27/15 1341  . HYDROmorphone (DILAUDID) injection 1 mg  1 mg Intravenous Q3H PRN Adaorah E Okafor, DO   1 mg at 10/27/15 1341  . ipratropium-albuterol (DUONEB) 0.5-2.5 (3) MG/3ML  nebulizer solution 3 mL  3 mL Nebulization Q6H Adaorah E Okafor, DO   3 mL at 10/27/15 1309  . levofloxacin (LEVAQUIN) IVPB 750 mg  750 mg Intravenous Q24H Adaorah E Okafor, DO      . mometasone-formoterol (DULERA) 100-5 MCG/ACT inhaler 2 puff  2 puff Inhalation BID Adaorah E Okafor, DO   2 puff at 10/27/15 0845  . multivitamin with minerals tablet 1 tablet  1 tablet Oral Daily Adaorah E Okafor, DO   1 tablet at 10/27/15 0841  . nitroGLYCERIN (NITROSTAT) SL tablet 0.4 mg  0.4 mg Sublingual Q5 Min x 3 PRN Adaorah E Okafor, DO      . oxyCODONE-acetaminophen (PERCOCET/ROXICET) 5-325 MG per tablet 1 tablet  1 tablet Oral Q6H PRN Samson Frederic, DO   1 tablet at 10/27/15 0233  . pantoprazole (PROTONIX) EC tablet 40 mg  40 mg Oral Daily Adaorah E Okafor, DO   40 mg at 10/27/15 0841  . piperacillin-tazobactam (ZOSYN) IVPB 3.375 g  3.375 g Intravenous 3 times per day Adaorah E Okafor, DO   3.375 g at 10/27/15 0501  . tiotropium (SPIRIVA) inhalation capsule 18 mcg  18 mcg Inhalation Daily Adaorah E Okafor, DO   18 mcg at 10/27/15 0957  .  vancomycin (VANCOCIN) 1,250 mg in sodium chloride 0.9 % 250 mL IVPB  1,250 mg Intravenous Q18H Adaorah E Okafor, DO   1,250 mg at 10/27/15 0151    Past Medical History  Diagnosis Date  . COPD (chronic obstructive pulmonary disease) ([place])   . Chronic systolic CHF (congestive heart failure) ([place])   . Kidney stone   . Macular degeneration   . HLD (hyperlipidemia)   . Hypertensive retinopathy   . HTN (hypertension)   . GERD (gastroesophageal reflux disease)     Past Surgical History  Procedure Laterality Date  . Abdominal aortic aneurysm repair    . Hernia repair    . Kidney stone surgery    . Chest tube insertion Right 07/07/2015    Procedure: INSERTION PLEURAL DRAINAGE CATHETER;  Surgeon: Nestor Lewandowsky, MD;  Location: [facility] ORS;  Service: Thoracic;  Laterality: Right;    Social History Social History  Substance Use Topics  . Smoking status: Current Every Day  Smoker -- 0.25 packs/day for 45 years    Types: Cigarettes  . Smokeless tobacco: Never Used  . Alcohol Use: No  No IVDU  Family History Family History  Problem Relation Age of Onset  . Heart attack Mother   . Stroke Mother   . Cancer Father   . COPD Father   . Heart attack Father   no bleeding or clotting disorders  Allergies  Allergen Reactions  . Lisinopril Anaphylaxis  . Metoprolol Other (See Comments)    Reaction:  Ringing in ears      REVIEW OF SYSTEMS (Negative unless checked)  Constitutional: [] Weight loss  [x] Fever  [] Chills Cardiac: [] Chest pain   [] Chest pressure   [] Palpitations   [] Shortness of breath when laying flat   [] Shortness of breath at rest   [] Shortness of breath with exertion. Vascular:  [] Pain in legs with walking   [] Pain in legs at rest   [] Pain in legs when laying flat   [] Claudication   [] Pain in feet when walking  [x] Pain in feet at rest  [] Pain in feet when laying flat   [] History of DVT   [] Phlebitis   [] Swelling in legs   [] Varicose veins   [x] Non-healing ulcers Pulmonary:   [] Uses home oxygen   [] Productive cough   [] Hemoptysis   [] Wheeze  [] COPD   [] Asthma Neurologic:  [] Dizziness  [] Blackouts   [] Seizures   [] History of stroke   [] History of TIA  [] Aphasia   [] Temporary blindness   [] Dysphagia   [] Weakness or numbness in arms   [] Weakness or numbness in legs Musculoskeletal:  [] Arthritis   [] Joint swelling   [] Joint pain   [] Low back pain Hematologic:  [] Easy bruising  [] Easy bleeding   [] Hypercoagulable state   [] Anemic  [] Hepatitis Gastrointestinal:  [] Blood in stool   [] Vomiting blood  [] Gastroesophageal reflux/heartburn   [] Difficulty swallowing. Genitourinary:  [x] Chronic kidney disease   [x] Difficult urination  [] Frequent urination  [] Burning with urination   [x] Blood in urine Skin:  [] Rashes   [x] Ulcers   [x] Wounds Psychological:  [] History of anxiety   []  History of major depression.  Physical Examination  Filed Vitals:   10/27/15  0415 10/27/15 0817 10/27/15 0833 10/27/15 1309  BP: 131/85  126/86   Pulse: 97  110   Temp: 97.7 F (36.5 C)  97.3 F (36.3 C)   TempSrc: Axillary  Oral   Resp: 22     Height:      Weight:      SpO2: 97% 96% 96%  96%   Body mass index is 23.1 kg/(m^2). Gen:  WD/WN, NAD Head: [place]/AT, No temporalis wasting. Prominent temp pulse not noted. Ear/Nose/Throat: Hearing grossly intact, nares w/o erythema or drainage, oropharynx w/o Erythema/Exudate Eyes: PERRLA, EOMI.  Neck: Supple, no nuchal rigidity.  No JVD.  Pulmonary:  Good air movement, Pleurx drain in right chest.  Cardiac: RRR, normal S1, S2, no Murmurs, rubs or gallops. Vascular:  Vessel Right Left  Radial Palpable Palpable  Ulnar Palpable Palpable  Brachial Palpable Palpable  Carotid Palpable, without bruit Palpable, without bruit  Aorta Not palpable N/A  Femoral Palpable Palpable  Popliteal Not Palpable Not Palpable  PT Palpable Not Palpable  DP Not Palpable weakly Palpable   Gastrointestinal: soft, non-tender/non-distended. No guarding/reflex. No masses, surgical incisions, or scars. Musculoskeletal: M/S 5/5 throughout.  No deformity or atrophy. Mild LLE edema. Neurologic: CN 2-12 intact. Pain and light touch intact in extremities.  Symmetrical.  Speech is fluent. Motor exam as listed above. Psychiatric: Judgment intact, Mood & affect appropriate for pt's clinical situation. Dermatologic: redness of forefoot.  Gangrenous changes on the top of the fifth toe on the left. Lymph : No Cervical, Axillary, or Inguinal lymphadenopathy.      CBC Lab Results  Component Value Date   WBC 8.7 10/27/2015   HGB 12.2* 10/27/2015   HCT 39.8* 10/27/2015   MCV 74.4* 10/27/2015   PLT 262 10/27/2015    BMET    Component Value Date/Time   NA 136 10/27/2015 0526   NA 136 12/24/2014 0421   K 4.1 10/27/2015 0526   K 4.1 12/24/2014 0421   CL 105 10/27/2015 0526   CL 105 12/24/2014 0421   CO2 24 10/27/2015 0526   CO2 23  12/24/2014 0421   GLUCOSE 114* 10/27/2015 0526   GLUCOSE 163* 12/24/2014 0421   BUN 30* 10/27/2015 0526   BUN 33* 12/24/2014 0421   CREATININE 1.55* 10/27/2015 0526   CREATININE 1.48* 12/24/2014 0421   CALCIUM 7.9* 10/27/2015 0526   CALCIUM 8.3* 12/24/2014 0421   GFRNONAA 43* 10/27/2015 0526   GFRNONAA 47* 12/24/2014 0421   GFRNONAA 18* 08/18/2014 0500   GFRAA 50* 10/27/2015 0526   GFRAA 54* 12/24/2014 0421   GFRAA 21* 08/18/2014 0500   Estimated Creatinine Clearance: 49 mL/min (by C-G formula based on Cr of 1.55).  COAG Lab Results  Component Value Date   INR 1.27 08/24/2015   INR 1.22 07/06/2015   INR 1.17 06/12/2015    Radiology Dg Chest 2 View  10/08/2015  CLINICAL DATA:  Increased shortness of breath. Dizziness, nausea. Patient is being treated for pneumonia. EXAM: CHEST  2 VIEW COMPARISON:  10/01/2015 FINDINGS: The cardiac silhouette is enlarged, stable. Mediastinal contours appear intact. Tortuosity and calcified atherosclerotic disease of the aorta are seen. There is no evidence of focal airspace consolidation, or pneumothorax. There is mild interstitial pulmonary edema. Linear subpleural opacity with appearance of scarring in the right mid lung is stable. There is a moderate right pleural effusion and smaller left pleural effusion. Curvilinear radiopaque catheter overlies the right hemidiaphragm. Osseous structures are without acute abnormality. Soft tissues are grossly normal. IMPRESSION: Enlarged cardiac silhouette, stable. Mild interstitial pulmonary edema. Right greater than left pleural effusions. Atherosclerotic disease of the aorta. Electronically Signed   By: Fidela Salisbury M.D.   On: 10/08/2015 10:55   Dg Chest 2 View  10/01/2015  CLINICAL DATA:  Productive cough, shortness of breath, chest pain. EXAM: CHEST  2 VIEW COMPARISON:  August 26, 2015. FINDINGS:  Stable cardiomegaly and central pulmonary vascular congestion is noted. Mildly increased interstitial  densities are noted throughout both lungs concerning for pulmonary edema. Mild bilateral pleural effusions are noted. No pneumothorax is noted. Bony thorax is unremarkable. IMPRESSION: Mild central pulmonary vascular congestion and bilateral pulmonary edema is noted, with mildly increased bilateral pleural effusions, right greater than left. Electronically Signed   By: Marijo Conception, M.D.   On: 10/01/2015 12:12   Dg Chest Portable 1 View  10/26/2015  CLINICAL DATA:  Shortness of breath and cough, worsening over the past few days. EXAM: PORTABLE CHEST 1 VIEW COMPARISON:  PA and lateral chest 10/08/2015. FINDINGS: The patient's right pleural effusion is increased since the prior examination. There appears to be pleural drain in place. Trace left pleural effusion is noted. Basilar airspace disease is seen on the right. There is cardiomegaly and vascular congestion. IMPRESSION: Increased right pleural effusion and basilar airspace disease. The patient appears to have a pleural drain in place. Trace left pleural effusion. Cardiomegaly and vascular congestion. Electronically Signed   By: Inge Rise M.D.   On: 10/26/2015 14:45   Dg Foot 2 Views Left  10/26/2015  CLINICAL DATA:  75 year old male with cellulitis and possible small toe necrosis EXAM: LEFT FOOT - 2 VIEW COMPARISON:  None. FINDINGS: There is no evidence of fracture or dislocation. There is no evidence of arthropathy or other focal bone abnormality. Soft tissues are unremarkable. IMPRESSION: Negative. No conventional radiographic evidence of osteomyelitis. Electronically Signed   By: Jacqulynn Cadet M.D.   On: 10/26/2015 16:34   Dg Foot Complete Left  10/08/2015  CLINICAL DATA:  Something heavy (can of sauerkraut) fell on his LEFT foot, pains, swelling, and redness at fourth and fifth toes EXAM: LEFT FOOT - COMPLETE 3+ VIEW COMPARISON:  None. FINDINGS: Diffuse osseous demineralization. Joint spaces preserved. Prominent Achilles insertion  calcaneal spur. Dorsal soft tissue swelling overlying particularly the metatarsals. No definite acute fracture, dislocation, or bone destruction. IMPRESSION: No acute bony abnormalities. Electronically Signed   By: Lavonia Dana M.D.   On: 10/08/2015 10:59      Assessment/Plan 1. Gangrene left fifth toe.  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  He has history of open AAA repair a little over a decade ago.  Would recommend angiogram with possible intervention.  With his upcoming urologic procedure and need for anticoagulation with the angiogram, would plan to delay this until Monday.  Risks and benefits were discussed with the patient.  He is also aware that this is a critical and limb threatening situation.  His multiple medical comorbidities also increase his risks of limb loss. 2. AAA. Status post open repair in 2006.  May complicate angiogram intervention depending on anatomy of implant.  Will see this with angiogram.   3. Chronic right pleural effusion.  Pleurx catheter in place.  Pulmonology has seen. 4. Nephrolithiasis with nephrostomy tube in place.  For ureteral stent and urologic treatment on Friday. 5. HTN. Stable on outpatient medications   DEW,JASON, MD  10/27/2015 2:19 PM

## 2015-10-27 NOTE — Clinical Social Work Note (Signed)
Clinical Social Work Assessment  Patient Details  Name: Collin [name] MRN: [id number] Date of Birth: 12/18/1940  Date of referral:  10/27/15               Reason for consult:  Facility Placement, Other (Comment Required) (COPD Gold )                Permission sought to share information with:  Facility Contact Representative Permission granted to share information::  No  Name::        Agency::     Relationship::     Contact Information:     Housing/Transportation Living arrangements for the past 2 months:  Single Family Home Source of Information:  Patient Patient Interpreter Needed:  None Criminal Activity/Legal Involvement Pertinent to Current Situation/Hospitalization:  No - Comment as needed Significant Relationships:  Spouse Lives with:  Spouse Do you feel safe going back to the place where you live?  Yes Need for family participation in patient care:  Yes (Comment)  Care giving concerns:  Patient lives in [place] with his wife Collin [name].    Social Worker assessment / plan:  Clinical Social Worker (CSW) received COPD Gold Consult. PT is pending. CSW met with patient and no family was at bedside. CSW introduced self and explained role of CSW department. Patient reported that she lives in [place] with his wife Collin [name]. Per patient his wife is 20 years younger then him, they have no children and they have been married since 2003. Per patient he has had a chest tube since October 2016 and his wife drains 1 liter a day on average. Patient spoke very fondly of his wife and stated that she takes excellent care of him. Per patient the doctors brag on how good his wife is taking care of him because he is infection free. 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 Per patient he enjoys preaching to the elderly and it is therapeutic for him. CSW explained that PT will evaluate patient and recommend SNF or home health. Patient reported that he  prefers to go home with his wife. Patient reported that he uses a cane to assist with ambulation.   CSW completed depression and anxiety screen with patient. Patient denied depression symptoms. Patient did report that he becomes anxious when he can't breath. Patient reported that he only experiences anxiety related to his COPD and not being able to breath. CSW offered patient outpatient mental health resources and patient declined them. Patient reported no other needs. CSW will continue to follow and assist as needed.    Employment status:  Part-Time Insurance information:  Medicare, Medicaid In State PT Recommendations:  Not assessed at this time Information / Referral to community resources:  Skilled Nursing Facility, Other (Comment Required) (Home Health VS. SNF )  Patient/Family's Response to care: Patient prefers to return home with his wife.   Patient/Family's Understanding of and Emotional Response to Diagnosis, Current Treatment, and Prognosis:  Patient was pleasant throughout assessment and thanked CSW for visit.   Emotional Assessment Appearance:  Appears stated age Attitude/Demeanor/Rapport:    Affect (typically observed):  Accepting, Adaptable, Pleasant Orientation:  Oriented to Self, Oriented to Place, Oriented to  Time, Oriented to Situation Alcohol / Substance use:  Not Applicable Psych involvement (Current and /or in the community):  No (Comment)  Discharge Needs  Concerns to be addressed:  Discharge Planning Concerns Readmission within the last 30 days:    Yes Current discharge risk:  Chronically ill Barriers to Discharge:  Continued Medical Work up   Elwyn Reach 10/27/2015, 11:24 AM

## 2015-10-27 NOTE — NC FL2 (Signed)
[place] LEVEL OF CARE SCREENING TOOL     IDENTIFICATION  Patient Name: Collin [name] Birthdate: 1941/02/08 Sex: male Admission Date (Current Location): 10/26/2015  [facility] and [place] Number:  Selena Lesser  ([id number]) Facility and Address:  [facility], [address], [place], [place] [address]      Provider Number: [id number]  Attending Physician Name and Address:  Henreitta Leber, MD  Relative Name and Phone Number:       Current Level of Care: Hospital Recommended Level of Care: [facility] Prior Approval Number:    Date Approved/Denied:   PASRR Number:  ([id number])  Discharge Plan: SNF    Current Diagnoses: Patient Active Problem List   Diagnosis Date Noted  . Pneumonia 10/26/2015  . Sepsis ([place]) 10/26/2015  . Chronic respiratory failure with hypoxia ([place]) 10/26/2015  . Cellulitis of left foot 10/26/2015  . Gangrenous toe ([place]) 10/26/2015  . ARF (acute renal failure) ([place])   . Hydronephrosis   . UTI (lower urinary tract infection)   . Hydronephrosis with urinary obstruction due to renal calculus   . Acute congestive heart failure ([place]) 08/22/2015  . 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 Acute on chronic respiratory failure ([place]) 06/12/2015  . Acute on chronic systolic CHF (congestive heart failure) ([place]) 03/19/2015  . Open wound of left heel 03/19/2015  . Recurrent right pleural effusion 03/04/2015  . Acute respiratory failure with hypoxemia ([place]) 03/04/2015  . COPD (chronic obstructive pulmonary disease) ([place]) 03/04/2015  . HLD (hyperlipidemia) 03/04/2015  . Chronic systolic CHF (congestive heart failure) ([place]) 03/04/2015  . HTN (hypertension) 03/04/2015  . GERD (gastroesophageal reflux disease) 03/04/2015    Orientation RESPIRATION BLADDER Height & Weight     Self, Time, Situation, Place  O2 (2 Liters Oxygen ) Incontinent (Nephrostomy Right ) Weight: 180 lb (81.647 kg) Height:  6\' 2"   (188 cm)  BEHAVIORAL SYMPTOMS/MOOD NEUROLOGICAL BOWEL NUTRITION STATUS   (none )  (none ) Continent Diet (Diet: Heart Healthy )  AMBULATORY STATUS COMMUNICATION OF NEEDS Skin   Extensive Assist Verbally Other (Comment) (Chest Tube Right )                       Personal Care Assistance Level of Assistance  Bathing, Feeding, Dressing Bathing Assistance: Limited assistance Feeding assistance: Independent Dressing Assistance: Limited assistance     Functional Limitations Info  Sight, Hearing, Speech Sight Info: Adequate Hearing Info: Adequate Speech Info: Adequate    SPECIAL CARE FACTORS FREQUENCY  PT (By licensed PT), OT (By licensed OT)     PT Frequency:  (5) OT Frequency:  (5)            Contractures      Additional Factors Info  Code Status, Allergies Code Status Info:  (Full Code. ) Allergies Info:  (Lisinopril, Metoprolol)           Current Medications (10/27/2015):  This is the current hospital active medication list Current Facility-Administered Medications  Medication Dose Route Frequency Provider Last Rate Last Dose  . albuterol (PROVENTIL) (2.5 MG/3ML) 0.083% nebulizer solution 2.5 mg  2.5 mg Inhalation Q4H PRN Adaorah E Okafor, DO      . ALPRAZolam Duanne Moron) tablet 0.25 mg  0.25 mg Oral QHS PRN Samson Frederic, DO   0.25 mg at 10/26/15 2226  . aspirin EC tablet 81 mg  81 mg Oral Daily Adaorah E Okafor, DO   81 mg at 10/27/15 0841  .  carvedilol (COREG) tablet 6.25 mg  6.25 mg Oral BID Adaorah E Okafor, DO   6.25 mg at 10/27/15 0841  . furosemide (LASIX) tablet 40 mg  40 mg Oral TID Samson Frederic, DO   40 mg at 10/27/15 0841  . guaiFENesin-dextromethorphan (ROBITUSSIN DM) 100-10 MG/5ML syrup 5 mL  5 mL Oral Q4H PRN Lance Coon, MD   5 mL at 10/27/15 0233  . heparin injection 5,000 Units  5,000 Units Subcutaneous 3 times per day Samson Frederic, DO   5,000 Units at 10/27/15 0502  . HYDROmorphone (DILAUDID) injection 1 mg  1 mg Intravenous Q3H PRN  Adaorah E Okafor, DO   1 mg at 10/27/15 0842  . ipratropium-albuterol (DUONEB) 0.5-2.5 (3) MG/3ML nebulizer solution 3 mL  3 mL Nebulization Q6H Adaorah E Okafor, DO   3 mL at 10/27/15 0817  . levofloxacin (LEVAQUIN) IVPB 750 mg  750 mg Intravenous Q24H Adaorah E Okafor, DO      . mometasone-formoterol (DULERA) 100-5 MCG/ACT inhaler 2 puff  2 puff Inhalation BID Adaorah E Okafor, DO   2 puff at 10/27/15 0845  . multivitamin with minerals tablet 1 tablet  1 tablet Oral Daily Adaorah E Okafor, DO   1 tablet at 10/27/15 0841  . nitroGLYCERIN (NITROSTAT) SL tablet 0.4 mg  0.4 mg Sublingual Q5 Min x 3 PRN Adaorah E Okafor, DO      . oxyCODONE-acetaminophen (PERCOCET/ROXICET) 5-325 MG per tablet 1 tablet  1 tablet Oral Q6H PRN Samson Frederic, DO   1 tablet at 10/27/15 0233  . pantoprazole (PROTONIX) EC tablet 40 mg  40 mg Oral Daily Adaorah E Okafor, DO   40 mg at 10/27/15 0841  . piperacillin-tazobactam (ZOSYN) IVPB 3.375 g  3.375 g Intravenous 3 times per day Adaorah E Okafor, DO   3.375 g at 10/27/15 0501  . tiotropium (SPIRIVA) inhalation capsule 18 mcg  18 mcg Inhalation Daily Adaorah E Okafor, DO   18 mcg at 10/27/15 0957  . vancomycin (VANCOCIN) 1,250 mg in sodium chloride 0.9 % 250 mL IVPB  1,250 mg Intravenous Q18H Adaorah E Okafor, DO   1,250 mg at 10/27/15 0151     Discharge Medications: Please see discharge summary for a list of discharge medications.  Relevant Imaging Results:  Relevant Lab Results:   Additional Information  (SSN: [id number])  Loralyn Freshwater, LCSW

## 2015-10-27 NOTE — Progress Notes (Signed)
[place] at [place] NAME: Collin [name]    MR#:  [id number]  DATE OF BIRTH:  1941/01/28  SUBJECTIVE:   Patient here due to worsening shortness of breath and also a left toe infection.  Afebrile, but still complaining of significant shortness of breath even on minimal exertion.    REVIEW OF SYSTEMS:    Review of Systems  Constitutional: Negative for fever and chills.  HENT: Negative for congestion and tinnitus.   Eyes: Negative for blurred vision and double vision.  Respiratory: Positive for cough and shortness of breath. Negative for wheezing.   Cardiovascular: Negative for chest pain, orthopnea and PND.  Gastrointestinal: Negative for nausea, vomiting, abdominal pain and diarrhea.  Genitourinary: Negative for dysuria and hematuria.  Neurological: Positive for weakness. Negative for dizziness, sensory change and focal weakness.  All other systems reviewed and are negative.   Nutrition: Heart healthy Tolerating Diet: Yes Tolerating PT:  Await Eval.   DRUG ALLERGIES:   Allergies  Allergen Reactions  . Lisinopril Anaphylaxis  . Metoprolol Other (See Comments)    Reaction:  Ringing in ears     VITALS:  Blood pressure 125/88, pulse 103, temperature 97.7 F (36.5 C), temperature source Oral, resp. rate 17, height 6\' 2"  (1.88 m), weight 81.647 kg (180 lb), SpO2 99 %.  PHYSICAL EXAMINATION:   Physical Exam  GENERAL:  75 y.o.-year-old patient siting up in bed in mild resp. Distress.  EYES: Pupils equal, round, reactive to light and accommodation. No scleral icterus. Extraocular muscles intact.  HEENT: Head atraumatic, normocephalic. Oropharynx and nasopharynx clear.  NECK:  Supple, no jugular venous distention. No thyroid enlargement, no tenderness.  LUNGS: Prolonged insp. & exp. Phase, minimal end-exp. Wheezing, no rales, rhonchi. No use of accessory muscles of respiration.  CARDIOVASCULAR: S1, S2 normal. No murmurs, rubs,  or gallops.  ABDOMEN: Soft, nontender, nondistended. Bowel sounds present. No organomegaly or mass.  EXTREMITIES: No cyanosis, clubbing or edema b/l. Left Great Toe gangrenous appearing with some drainage between toes.   NEUROLOGIC: Cranial nerves II through XII are intact. No focal Motor or sensory deficits b/l.   PSYCHIATRIC: The patient is alert and oriented x 3.  SKIN: No obvious rash, lesion, or ulcer.    LABORATORY PANEL:   CBC  Recent Labs Lab 10/27/15 0526  WBC 8.7  HGB 12.2*  HCT 39.8*  PLT 262   ------------------------------------------------------------------------------------------------------------------  Chemistries   Recent Labs Lab 10/27/15 0526  NA 136  K 4.1  CL 105  CO2 24  GLUCOSE 114*  BUN 30*  CREATININE 1.55*  CALCIUM 7.9*  AST 18  ALT 20  ALKPHOS 153*  BILITOT 0.7   ------------------------------------------------------------------------------------------------------------------  Cardiac Enzymes  Recent Labs Lab 10/26/15 1223  TROPONINI 0.03   ------------------------------------------------------------------------------------------------------------------  RADIOLOGY:  Dg Chest Portable 1 View  10/26/2015  CLINICAL DATA:  Shortness of breath and cough, worsening over the past few days. EXAM: PORTABLE CHEST 1 VIEW COMPARISON:  PA and lateral chest 10/08/2015. FINDINGS: The patient's right pleural effusion is increased since the prior examination. There appears to be pleural drain in place. Trace left pleural effusion is noted. Basilar airspace disease is seen on the right. There is cardiomegaly and vascular congestion. IMPRESSION: Increased right pleural effusion and basilar airspace disease. The patient appears to have a pleural drain in place. Trace left pleural effusion. Cardiomegaly and vascular congestion. Electronically Signed   By: Inge Rise M.D.   On: 10/26/2015 14:45   Dg  Foot 2 Views Left  10/26/2015  CLINICAL DATA:   75 year old male with cellulitis and possible small toe necrosis EXAM: LEFT FOOT - 2 VIEW COMPARISON:  None. FINDINGS: There is no evidence of fracture or dislocation. There is no evidence of arthropathy or other focal bone abnormality. Soft tissues are unremarkable. IMPRESSION: Negative. No conventional radiographic evidence of osteomyelitis. Electronically Signed   By: Jacqulynn Cadet M.D.   On: 10/26/2015 16:34     ASSESSMENT AND PLAN:   75 year old male with past medical history COPD, chronic systolic CHF, history of nephrolithiasis, history of macular degeneration, hypertension, hyperlipidemia who presents to the hospital due to shortness of breath and noted to have a left Toe gangrene.   # 1 Acute on chronic resp. Failure - etiology unclear.  ?? Related to pneumonia w/ pleural effusion.   - pt. Already has a right pleurx cath in place for drainage.  - cont. Broad spectrum Abx w/ Zosyn, Levaquin. D/C Vanco.  - appreciate Pulm. Input and cont. Current care for now.   #2 left toe gangrene-patient was seen by vascular surgery and didn't recommend doing an angiogram but probably in the next 1-2 days. -I will get a podiatry consult to see if that needs any further debridement. -Continue broad-spectrum IV antibiotics with Zosyn, Levaquin for now.  #3 COPD-continue duo nebs, Dulera, Spiriva. -No acute exacerbation presently.  #4 anxiety-continue as needed Xanax.  #5 chronic kidney disease stage III-creatinine at baseline and will continue to monitor.  #6 history of nephrolithiasis status post ureteral stent-patient has a nephrostomy tube which is draining well.  -Continue follow-up with urology as outpatient.   All the records are reviewed and case discussed with Care Management/Social Workerr. Management plans discussed with the patient, family and they are in agreement.  CODE STATUS: Full  DVT Prophylaxis: Heparin subcutaneous  TOTAL TIME TAKING CARE OF THIS PATIENT: 30 minutes.    POSSIBLE D/C IN 1-2 DAYS, DEPENDING ON CLINICAL CONDITION.   Henreitta Leber M.D on 10/27/2015 at 3:12 PM  Between 7am to 6pm - Pager - [phone]  After 6pm go to [email or web address] - password [email or web address] [facility]  Office  [phone]  CC: Primary care physician; [name], MD

## 2015-10-28 LAB — CBC
HEMATOCRIT: 39.9 % — AB (ref 40.0–52.0)
Hemoglobin: 12.5 g/dL — ABNORMAL LOW (ref 13.0–18.0)
MCH: 23.1 pg — ABNORMAL LOW (ref 26.0–34.0)
MCHC: 31.3 g/dL — AB (ref 32.0–36.0)
MCV: 73.8 fL — AB (ref 80.0–100.0)
PLATELETS: 281 10*3/uL (ref 150–440)
RBC: 5.41 MIL/uL (ref 4.40–5.90)
RDW: 22.3 % — AB (ref 11.5–14.5)
WBC: 8.3 10*3/uL (ref 3.8–10.6)

## 2015-10-28 LAB — BASIC METABOLIC PANEL
ANION GAP: 9 (ref 5–15)
BUN: 33 mg/dL — ABNORMAL HIGH (ref 6–20)
CALCIUM: 8.3 mg/dL — AB (ref 8.9–10.3)
CO2: 22 mmol/L (ref 22–32)
CREATININE: 1.62 mg/dL — AB (ref 0.61–1.24)
Chloride: 102 mmol/L (ref 101–111)
GFR calc Af Amer: 47 mL/min — ABNORMAL LOW (ref >60)
GFR, EST NON AFRICAN AMERICAN: 40 mL/min — AB (ref >60)
GLUCOSE: 122 mg/dL — AB (ref 65–99)
Potassium: 4.2 mmol/L (ref 3.5–5.1)
Sodium: 133 mmol/L — ABNORMAL LOW (ref 135–145)

## 2015-10-28 LAB — HIV ANTIBODY (ROUTINE TESTING W REFLEX): HIV Screen 4th Generation wRfx: NONREACTIVE

## 2015-10-28 MED ORDER — ZOLPIDEM TARTRATE 5 MG PO TABS
5.0000 mg | ORAL_TABLET | Freq: Every evening | ORAL | Status: DC | PRN
  Administered 2015-10-28: 5 mg via ORAL
  Filled 2015-10-28: qty 1

## 2015-10-28 MED ORDER — OXYCODONE-ACETAMINOPHEN 5-325 MG PO TABS
1.0000 | ORAL_TABLET | Freq: Four times a day (QID) | ORAL | Status: DC | PRN
  Administered 2015-10-28 – 2015-10-29 (×3): 1 via ORAL
  Filled 2015-10-28 (×4): qty 1

## 2015-10-28 NOTE — Progress Notes (Signed)
Date: 10/28/2015,   MRN# [id number] Collin [name] March 27, 1941 Code Status:     Code Status Orders        Start     Ordered   10/26/15 2209  Full code   Continuous     10/26/15 2208    Code Status History    Date Active Date Inactive Code Status Order ID Comments User Context   08/22/2015  5:44 PM 08/31/2015  4:39 PM Full Code [id number]  Theodoro Grist, MD Inpatient   07/07/2015  1:57 PM 07/08/2015  2:28 PM Full Code [id number]  Nestor Lewandowsky, MD Inpatient   06/12/2015  3:54 PM 06/14/2015  5:01 PM Full Code [id number]  Baxter Hire, MD Inpatient   03/20/2015 12:20 AM 03/21/2015  9:25 PM Full Code [id number]  Lance Coon, MD ED   03/04/2015  5:52 AM 03/06/2015  2:26 PM Full Code [id number]  Lance Coon, MD Inpatient     Hosp day:@LENGTHOFSTAYDAYS @ Referring MD: @ATDPROV @      CC:   HPI: comfortable, less sob, no chest pain, see last note. No new complaints  PMHX:   Past Medical History  Diagnosis Date  . COPD (chronic obstructive pulmonary disease) ([place])   . Chronic systolic CHF (congestive heart failure) ([place])   . Kidney stone   . Macular degeneration   . HLD (hyperlipidemia)   . Hypertensive retinopathy   . HTN (hypertension)   . GERD (gastroesophageal reflux disease)    Surgical Hx:  Past Surgical History  Procedure Laterality Date  . Abdominal aortic aneurysm repair    . Hernia repair    . Kidney stone surgery    . Chest tube insertion Right 07/07/2015    Procedure: INSERTION PLEURAL DRAINAGE CATHETER;  Surgeon: Nestor Lewandowsky, MD;  Location: [facility] ORS;  Service: Thoracic;  Laterality: Right;   Family Hx:  Family History  Problem Relation Age of Onset  . Heart attack Mother   . Stroke Mother   . Cancer Father   . COPD Father   . Heart attack Father    Social Hx:   Social History  Substance Use Topics  . Smoking status: Current Every Day Smoker -- 0.25 packs/day for 45 years    Types: Cigarettes  . Smokeless tobacco: Never Used  . Alcohol Use: No   Medication:     Home Medication:  No current outpatient prescriptions on file.  Current Medication: @CURMEDTAB @   Allergies:  Lisinopril and Metoprolol  Review of Systems: Gen:  Denies  fever, sweats, chills HEENT: Denies blurred vision, double vision, ear pain, eye pain, hearing loss, nose bleeds, sore throat Cvc:  No dizziness, chest pain or heaviness Resp:   Less sob Gi: Denies swallowing difficulty, stomach pain, nausea or vomiting, diarrhea, constipation, bowel incontinence Gu:  Denies bladder incontinence, burning urine Ext:   No Joint pain, stiffness or swelling Skin: No skin rash, easy bruising or bleeding or hives Endoc:  No polyuria, polydipsia , polyphagia or weight change Psych: No depression, insomnia or hallucinations  Other:  All other systems negative  Physical Examination:   VS: BP 132/83 mmHg  Pulse 99  Temp(Src) 97.3 F (36.3 C) (Oral)  Resp 17  Ht 6\' 2"  (1.88 m)  Wt 211 lb 1.6 oz (95.754 kg)  BMI 27.09 kg/m2  SpO2 98%  General Appearance: No distress  Neuro/psych without focal findings, mental status, speech normal, alert and oriented, cranial nerves 2-12 intact, reflexes normal and symmetric, sensation grossly normal  HEENT: PERRLA, EOM intact, no  ptosis, no other lesions noticed Pulmonary:.No wheezing, No rales  Decrease bs at the right base:   Cardiovascular:  Normal S1,S2.  No m/r/g.  .    Abdomen:Benign, Soft, non-tender, No masses, hepatosplenomegaly, No lymphadenopathy Endoc: No evident thyromegaly, no signs of acromegaly or Cushing features Skin:   warm, no rashes, no ecchymosis  Extremities: normal, no cyanosis, clubbing, no edema, warm with normal capillary refill. Other findings:   Labs results:   Recent Labs     10/26/15  1223  10/27/15  0526  10/28/15  0456  HGB  12.9*  12.2*  12.5*  HCT  43.2  39.8*  39.9*  MCV  75.1*  74.4*  73.8*  WBC  9.4  8.7  8.3  BUN  29*  30*  33*  CREATININE  1.44*  1.55*  1.62*  GLUCOSE  150*  114*  122*  CALCIUM   8.3*  7.9*  8.3*  ,    Rad results:      Assessment and Plan: Known hx of recurrent right pleural effusion, Right pleurovax in place. Here with pneumonia, left foot cellulitis and chf. (ef < 30 % ). Today quite stable  Plan -levaquin, zosyn -home meds -continue schedule of drainage of pleurovac -following      I have personally obtained a history, examined the patient, evaluated laboratory and imaging results, formulated the assessment and plan and placed orders.  The Patient requires high complexity decision making for assessment and support, frequent evaluation and titration of therapies, application of advanced monitoring technologies and extensive interpretation of multiple databases.   Herbon Fleming,M.D. Pulmonary & Critical care Medicine [facility]

## 2015-10-28 NOTE — Progress Notes (Signed)
Pharmacist - Prescriber Communication  Per [facility] policy, the order for diphenhydramine 25 mg po at bedtime PRN sleep has been changed to zolpidem 5 mg. Please call pharmacy if you have any questions about this substitution.   Denis Carreon A. [place], [place].D., BCPS Clinical Pharmacist 10/28/2015 [phone]

## 2015-10-28 NOTE — Progress Notes (Signed)
Pt. Had restless night d/t coughing and left foot pain. Medicated per MAR. Up ambulating around room during night with stand-by assist to assist with his cough and deep breathing. Pt. Unable to expel phlegm. Assisted back to bed. Left foot elevated up on pillows and HOB elevated. Pt. Resting quietly at this time. Will continue to monitor.

## 2015-10-28 NOTE — Progress Notes (Signed)
[name] at [place] NAME: Collin [name]    MR#:  [id number]  DATE OF BIRTH:  1940/12/15  SUBJECTIVE:   Patient here due to worsening shortness of breath and also a left toe infection Afebrile. Has chronic SOB Left toe gangrene.   REVIEW OF SYSTEMS:    Review of Systems  Constitutional: Negative for fever and chills.  HENT: Negative for congestion and tinnitus.   Eyes: Negative for blurred vision and double vision.  Respiratory: Positive for cough and shortness of breath. Negative for wheezing.   Cardiovascular: Negative for chest pain, orthopnea and PND.  Gastrointestinal: Negative for nausea, vomiting, abdominal pain and diarrhea.  Genitourinary: Negative for dysuria and hematuria.  Neurological: Positive for weakness. Negative for dizziness, sensory change and focal weakness.  All other systems reviewed and are negative.   Nutrition: Heart healthy Tolerating Diet: Yes  DRUG ALLERGIES:   Allergies  Allergen Reactions  . Lisinopril Anaphylaxis  . Metoprolol Other (See Comments)    Reaction:  Ringing in ears     VITALS:  Blood pressure 129/94, pulse 96, temperature 98.7 F (37.1 C), temperature source Oral, resp. rate 17, height 6\' 2"  (1.88 m), weight 95.754 kg (211 lb 1.6 oz), SpO2 96 %.  PHYSICAL EXAMINATION:   Physical Exam  GENERAL:  75 y.o.-year-old patient siting up in bed in mild resp. Distress.  EYES: Pupils equal, round, reactive to light and accommodation. No scleral icterus. Extraocular muscles intact.  HEENT: Head atraumatic, normocephalic. Oropharynx and nasopharynx clear.  NECK:  Supple, no jugular venous distention. No thyroid enlargement, no tenderness.  LUNGS: Normal WOB. no rales, rhonchi. No use of accessory muscles of respiration.  CARDIOVASCULAR: S1, S2 normal. No murmurs, rubs, or gallops.  ABDOMEN: Soft, nontender, nondistended. Bowel sounds present. No organomegaly or mass.  EXTREMITIES: No  cyanosis, clubbing or edema b/l. Left Great Toe gangrenous appearing with some drainage between toes.   NEUROLOGIC: Cranial nerves II through XII are intact. No focal Motor or sensory deficits b/l.   PSYCHIATRIC: The patient is alert and oriented x 3.  SKIN:Left great toe gangrene   LABORATORY PANEL:   CBC  Recent Labs Lab 10/28/15 0456  WBC 8.3  HGB 12.5*  HCT 39.9*  PLT 281   ------------------------------------------------------------------------------------------------------------------  Chemistries   Recent Labs Lab 10/27/15 0526 10/28/15 0456  NA 136 133*  K 4.1 4.2  CL 105 102  CO2 24 22  GLUCOSE 114* 122*  BUN 30* 33*  CREATININE 1.55* 1.62*  CALCIUM 7.9* 8.3*  AST 18  --   ALT 20  --   ALKPHOS 153*  --   BILITOT 0.7  --    ------------------------------------------------------------------------------------------------------------------  Cardiac Enzymes  Recent Labs Lab 10/26/15 1223  TROPONINI 0.03   ------------------------------------------------------------------------------------------------------------------  RADIOLOGY:  No results found.   ASSESSMENT AND PLAN:   75 year old male with past medical history COPD, chronic systolic CHF, history of nephrolithiasis, history of macular degeneration, hypertension, hyperlipidemia who presents to the hospital due to shortness of breath and noted to have a left Toe gangrene.   # Acute on chronic resp. Failure -  Related to pneumonia w/ pleural effusion.   - pt. Already has a right pleurx cath in place for drainage.  - cont. Broad spectrum Abx w/ Zosyn, Levaquin. - appreciate Pulm. Input and cont  #2 left toe gangrene patient was seen by vascular surgery and did recommend doing an angiogram after his nephrostomy tube is replaced -Continue broad-spectrum IV  antibiotics with Zosyn, Levaquin.  # COPD-continue duo nebs, Dulera, Spiriva. - No wheezing  # anxiety-continue as needed Xanax.  # chronic  kidney disease stage III-creatinine at baseline and will continue to monitor.  # history of nephrolithiasis status post ureteral stent-patient has a nephrostomy tube which is draining well.  IR to replace tube tomorrow -Continue follow-up with urology as outpatient.   All the records are reviewed and case discussed with Care Management/Social Workerr. Management plans discussed with the patient, family and they are in agreement.  CODE STATUS: Full  DVT Prophylaxis: Heparin subcutaneous  TOTAL TIME TAKING CARE OF THIS PATIENT: 30 minutes.   POSSIBLE D/C IN 2-3 DAYS, DEPENDING ON CLINICAL CONDITION.   Hillary Bow R M.D on 10/28/2015 at 4:25 PM  Between 7am to 6pm - Pager - [phone]  After 6pm go to [email or web address] - password [email or web address] [place] [facility]  Office  [phone]  CC: Primary care physician; [name], MD

## 2015-10-28 NOTE — Care Management Important Message (Signed)
Important Message  Patient Details  Name: Collin [name] MRN: [id number] Date of Birth: 13-Mar-1941   Medicare Important Message Given:  Yes    Juliann Pulse A Bryon Parker 10/28/2015, 12:13 PM

## 2015-10-28 NOTE — Progress Notes (Signed)
PT Cancellation Note  Patient Details Name: Collin [name] MRN: [id number] DOB: [birth year]   Cancelled Treatment:    Reason Eval/Treat Not Completed: Patient declined, no reason specified.  Pt adamantly refused PT d/t having "too much on my mind" with procedures coming up.  Pt reports needing nephrostomy tube changed tomorrow (had previous appt to do this) and vascular procedure next week (per notes plan for angioplasty Monday next week).  Pt educated on importance of preventing deconditioning during hospital stay (early mobilization and strengthening) and pt and pt's wife agreeing but pt adamant that he was not going to get up and work with PT and would mobilize with nursing when he felt like it.  This is pt's second refusal to work with PT.  Will re-attempt PT eval 1 more time at a later date/time.   Raquel Sarna Tahlia Deamer 10/28/2015, 9:46 AM Leitha Bleak, [place]

## 2015-10-28 NOTE — Progress Notes (Signed)
OT Cancellation Note  Patient Details Name: Collin [name] MRN: [id number] DOB: 1941/07/10   Cancelled Treatment:    Reason Eval/Treat Not Completed: Patient declined, no reason specified.  Patient refused, no reason.  Sharon Mt 10/28/2015, 2:11 PM

## 2015-10-29 ENCOUNTER — Ambulatory Visit: Payer: Medicare Other | Attending: Urology

## 2015-10-29 ENCOUNTER — Ambulatory Visit: Admission: RE | Admit: 2015-10-29 | Payer: Medicare Other | Source: Ambulatory Visit

## 2015-10-29 DIAGNOSIS — Z809 Family history of malignant neoplasm, unspecified: Secondary | ICD-10-CM | POA: Insufficient documentation

## 2015-10-29 DIAGNOSIS — N200 Calculus of kidney: Secondary | ICD-10-CM | POA: Insufficient documentation

## 2015-10-29 DIAGNOSIS — H3530 Unspecified macular degeneration: Secondary | ICD-10-CM | POA: Insufficient documentation

## 2015-10-29 DIAGNOSIS — J449 Chronic obstructive pulmonary disease, unspecified: Secondary | ICD-10-CM | POA: Insufficient documentation

## 2015-10-29 DIAGNOSIS — I110 Hypertensive heart disease with heart failure: Secondary | ICD-10-CM | POA: Insufficient documentation

## 2015-10-29 DIAGNOSIS — H35039 Hypertensive retinopathy, unspecified eye: Secondary | ICD-10-CM | POA: Insufficient documentation

## 2015-10-29 DIAGNOSIS — K219 Gastro-esophageal reflux disease without esophagitis: Secondary | ICD-10-CM | POA: Insufficient documentation

## 2015-10-29 DIAGNOSIS — E785 Hyperlipidemia, unspecified: Secondary | ICD-10-CM | POA: Insufficient documentation

## 2015-10-29 DIAGNOSIS — Z8249 Family history of ischemic heart disease and other diseases of the circulatory system: Secondary | ICD-10-CM | POA: Insufficient documentation

## 2015-10-29 DIAGNOSIS — I5022 Chronic systolic (congestive) heart failure: Secondary | ICD-10-CM | POA: Insufficient documentation

## 2015-10-29 DIAGNOSIS — Z825 Family history of asthma and other chronic lower respiratory diseases: Secondary | ICD-10-CM | POA: Insufficient documentation

## 2015-10-29 DIAGNOSIS — Z823 Family history of stroke: Secondary | ICD-10-CM | POA: Insufficient documentation

## 2015-10-29 MED ORDER — OXYCODONE-ACETAMINOPHEN 5-325 MG PO TABS: 1.0000 | ORAL_TABLET | Freq: Four times a day (QID) | ORAL | Status: DC | PRN

## 2015-10-29 MED ORDER — LIDOCAINE HCL (PF) 1 % IJ SOLN
INTRAMUSCULAR | Status: AC
  Filled 2015-10-29: qty 30

## 2015-10-29 MED ORDER — AMOXICILLIN-POT CLAVULANATE 875-125 MG PO TABS: 1.0000 | ORAL_TABLET | Freq: Two times a day (BID) | ORAL | Status: DC

## 2015-10-29 MED ORDER — IOHEXOL 300 MG/ML  SOLN
10.0000 mL | Freq: Once | INTRAMUSCULAR | Status: AC | PRN
  Administered 2015-10-29: 15 mL

## 2015-10-29 MED ORDER — ALPRAZOLAM 0.25 MG PO TABS
0.2500 mg | ORAL_TABLET | Freq: Two times a day (BID) | ORAL | Status: DC | PRN
Start: 2015-10-29 — End: 2015-12-10

## 2015-10-29 NOTE — Progress Notes (Signed)
Patient being discharged to home with wife. He will have angiogram as out-patient on Monday. Belongings packed, IV removed. Discharge & Rx instructions given. VSS at time of discharge.

## 2015-10-29 NOTE — Progress Notes (Signed)
PT Cancellation Note  Patient Details Name: Collin [name] MRN: [id number] DOB: [birth year]   Cancelled Treatment:    Reason Eval/Treat Not Completed: Patient at procedure or test/unavailable.  Pt off floor for procedure (per notes plan for R nephrostomy exchange today).  Will re-attempt PT eval at a later date/time.   Raquel Sarna Macarthur Lorusso 10/29/2015, 10:07 AM Leitha Bleak, [place]

## 2015-10-29 NOTE — Progress Notes (Signed)
[facility]  Daily Progress Note   Subjective  - * No surgery found *  For nephrostomy tube change today. Foot still hurts  Objective Filed Vitals:   10/29/15 0735 10/29/15 0744 10/29/15 0913 10/29/15 1028  BP:  136/78 130/93 126/94  Pulse:  96 97 99  Temp:  97 F (36.1 C) 97.7 F (36.5 C)   TempSrc:  Axillary Oral   Resp:  18 16 16   Height:      Weight:      SpO2: 96% 100%      Intake/Output Summary (Last 24 hours) at 10/29/15 1152 Last data filed at 10/28/15 2005  Gross per 24 hour  Intake   1280 ml  Output    400 ml  Net    880 ml    PULM  CTAB CV  RRR VASC  Foot chronically ischemic appearing. Gangrene on fifth toe  Laboratory CBC    Component Value Date/Time   WBC 8.3 10/28/2015 0456   WBC 9.3 12/24/2014 0421   HGB 12.5* 10/28/2015 0456   HGB 12.4* 12/24/2014 0421   HCT 39.9* 10/28/2015 0456   HCT 39.6* 12/24/2014 0421   PLT 281 10/28/2015 0456   PLT 150 12/24/2014 0421    BMET    Component Value Date/Time   NA 133* 10/28/2015 0456   NA 136 12/24/2014 0421   K 4.2 10/28/2015 0456   K 4.1 12/24/2014 0421   CL 102 10/28/2015 0456   CL 105 12/24/2014 0421   CO2 22 10/28/2015 0456   CO2 23 12/24/2014 0421   GLUCOSE 122* 10/28/2015 0456   GLUCOSE 163* 12/24/2014 0421   BUN 33* 10/28/2015 0456   BUN 33* 12/24/2014 0421   CREATININE 1.62* 10/28/2015 0456   CREATININE 1.48* 12/24/2014 0421   CALCIUM 8.3* 10/28/2015 0456   CALCIUM 8.3* 12/24/2014 0421   GFRNONAA 40* 10/28/2015 0456   GFRNONAA 47* 12/24/2014 0421   GFRNONAA 18* 08/18/2014 0500   GFRAA 47* 10/28/2015 0456   GFRAA 54* 12/24/2014 0421   GFRAA 21* 08/18/2014 0500    Assessment/Planning: Gangrene left fifth toe.  For angiogram Monday nephrostomy tube exchanged today and he did well Pleural effusion with PleurX catheter in place       Ortencia Askari  10/29/2015, 11:52 AM

## 2015-10-29 NOTE — Procedures (Signed)
R perc neph exchange No complication No blood loss. See complete dictation in [facility].

## 2015-10-29 NOTE — Discharge Instructions (Addendum)
°  DIET:  Cardiac diet  DISCHARGE CONDITION:  Stable  ACTIVITY:  Activity as tolerated  OXYGEN:  Home Oxygen: Yes   Oxygen Delivery: 2 L/min [place]  DISCHARGE LOCATION:  home   If you experience worsening of your admission symptoms, develop shortness of breath, life threatening emergency, suicidal or homicidal thoughts you must seek medical attention immediately by calling 911 or calling your MD immediately  if symptoms less severe.  You Must read complete instructions/literature along with all the possible adverse reactions/side effects for all the Medicines you take and that have been prescribed to you. Take any new Medicines after you have completely understood and accpet all the possible adverse reactions/side effects.   Please note  You were cared for by a hospitalist during your hospital stay. If you have any questions about your discharge medications or the care you received while you were in the hospital after you are discharged, you can call the unit and asked to speak with the hospitalist on call if the hospitalist that took care of you is not available. Once you are discharged, your primary care physician will handle any further medical issues. Please note that NO REFILLS for any discharge medications will be authorized once you are discharged, as it is imperative that you return to your primary care physician (or establish a relationship with a primary care physician if you do not have one) for your aftercare needs so that they can reassess your need for medications and monitor your lab values.

## 2015-10-30 LAB — CULTURE, RESPIRATORY W GRAM STAIN: Culture: NORMAL

## 2015-10-31 LAB — CULTURE, BLOOD (ROUTINE X 2)
CULTURE: NO GROWTH
CULTURE: NO GROWTH

## 2015-11-01 ENCOUNTER — Other Ambulatory Visit: Payer: Self-pay | Admitting: Vascular Surgery

## 2015-11-02 ENCOUNTER — Other Ambulatory Visit
Admission: RE | Admit: 2015-11-02 | Discharge: 2015-11-02 | Disposition: A | Discharge status: Home / Self Care | Payer: Medicare Other | Source: Ambulatory Visit | Attending: Vascular Surgery | Admitting: Vascular Surgery

## 2015-11-02 DIAGNOSIS — J90 Pleural effusion, not elsewhere classified: Secondary | ICD-10-CM | POA: Diagnosis not present

## 2015-11-02 DIAGNOSIS — Z79899 Other long term (current) drug therapy: Secondary | ICD-10-CM | POA: Diagnosis not present

## 2015-11-02 DIAGNOSIS — E785 Hyperlipidemia, unspecified: Secondary | ICD-10-CM | POA: Diagnosis not present

## 2015-11-02 DIAGNOSIS — N200 Calculus of kidney: Secondary | ICD-10-CM | POA: Diagnosis not present

## 2015-11-02 DIAGNOSIS — I5020 Unspecified systolic (congestive) heart failure: Secondary | ICD-10-CM | POA: Diagnosis not present

## 2015-11-02 DIAGNOSIS — K219 Gastro-esophageal reflux disease without esophagitis: Secondary | ICD-10-CM | POA: Diagnosis not present

## 2015-11-02 DIAGNOSIS — L97529 Non-pressure chronic ulcer of other part of left foot with unspecified severity: Secondary | ICD-10-CM | POA: Diagnosis not present

## 2015-11-02 DIAGNOSIS — F17210 Nicotine dependence, cigarettes, uncomplicated: Secondary | ICD-10-CM | POA: Diagnosis not present

## 2015-11-02 DIAGNOSIS — I70245 Atherosclerosis of native arteries of left leg with ulceration of other part of foot: Secondary | ICD-10-CM | POA: Diagnosis present

## 2015-11-02 DIAGNOSIS — J449 Chronic obstructive pulmonary disease, unspecified: Secondary | ICD-10-CM | POA: Diagnosis not present

## 2015-11-02 DIAGNOSIS — Z7901 Long term (current) use of anticoagulants: Secondary | ICD-10-CM | POA: Diagnosis not present

## 2015-11-02 DIAGNOSIS — I110 Hypertensive heart disease with heart failure: Secondary | ICD-10-CM | POA: Diagnosis not present

## 2015-11-02 LAB — CREATININE, SERUM
CREATININE: 1.86 mg/dL — AB (ref 0.61–1.24)
GFR calc Af Amer: 40 mL/min — ABNORMAL LOW (ref >60)
GFR, EST NON AFRICAN AMERICAN: 34 mL/min — AB (ref >60)

## 2015-11-02 LAB — BUN: BUN: 40 mg/dL — ABNORMAL HIGH (ref 6–20)

## 2015-11-03 ENCOUNTER — Ambulatory Visit
Admission: RE | Admit: 2015-11-03 | Discharge: 2015-11-03 | Disposition: A | Discharge status: Home / Self Care | Payer: Medicaid Other | Source: Ambulatory Visit | Attending: Vascular Surgery | Admitting: Vascular Surgery

## 2015-11-03 ENCOUNTER — Encounter
Admission: RE | Disposition: A | Discharge status: Home / Self Care | Payer: Self-pay | Source: Ambulatory Visit | Attending: Vascular Surgery

## 2015-11-03 DIAGNOSIS — I5020 Unspecified systolic (congestive) heart failure: Secondary | ICD-10-CM | POA: Insufficient documentation

## 2015-11-03 DIAGNOSIS — J449 Chronic obstructive pulmonary disease, unspecified: Secondary | ICD-10-CM | POA: Insufficient documentation

## 2015-11-03 DIAGNOSIS — I70248 Atherosclerosis of native arteries of left leg with ulceration of other part of lower left leg: Secondary | ICD-10-CM | POA: Diagnosis not present

## 2015-11-03 DIAGNOSIS — Z79899 Other long term (current) drug therapy: Secondary | ICD-10-CM | POA: Insufficient documentation

## 2015-11-03 DIAGNOSIS — I70245 Atherosclerosis of native arteries of left leg with ulceration of other part of foot: Secondary | ICD-10-CM | POA: Insufficient documentation

## 2015-11-03 DIAGNOSIS — Z7901 Long term (current) use of anticoagulants: Secondary | ICD-10-CM | POA: Insufficient documentation

## 2015-11-03 DIAGNOSIS — I714 Abdominal aortic aneurysm, without rupture: Secondary | ICD-10-CM | POA: Diagnosis not present

## 2015-11-03 DIAGNOSIS — I110 Hypertensive heart disease with heart failure: Secondary | ICD-10-CM | POA: Insufficient documentation

## 2015-11-03 DIAGNOSIS — J90 Pleural effusion, not elsewhere classified: Secondary | ICD-10-CM | POA: Insufficient documentation

## 2015-11-03 DIAGNOSIS — K219 Gastro-esophageal reflux disease without esophagitis: Secondary | ICD-10-CM | POA: Insufficient documentation

## 2015-11-03 DIAGNOSIS — L97529 Non-pressure chronic ulcer of other part of left foot with unspecified severity: Secondary | ICD-10-CM | POA: Insufficient documentation

## 2015-11-03 DIAGNOSIS — I10 Essential (primary) hypertension: Secondary | ICD-10-CM | POA: Diagnosis not present

## 2015-11-03 DIAGNOSIS — N200 Calculus of kidney: Secondary | ICD-10-CM | POA: Insufficient documentation

## 2015-11-03 DIAGNOSIS — I96 Gangrene, not elsewhere classified: Secondary | ICD-10-CM | POA: Diagnosis not present

## 2015-11-03 DIAGNOSIS — E785 Hyperlipidemia, unspecified: Secondary | ICD-10-CM | POA: Insufficient documentation

## 2015-11-03 DIAGNOSIS — F17210 Nicotine dependence, cigarettes, uncomplicated: Secondary | ICD-10-CM | POA: Insufficient documentation

## 2015-11-03 HISTORY — PX: PERIPHERAL VASCULAR CATHETERIZATION: SHX172C

## 2015-11-03 LAB — MISC LABCORP TEST (SEND OUT): LABCORP TEST CODE: 9985

## 2015-11-03 SURGERY — ABDOMINAL AORTOGRAM W/LOWER EXTREMITY
Wound class: Clean

## 2015-11-03 MED ORDER — MIDAZOLAM HCL 5 MG/5ML IJ SOLN
INTRAMUSCULAR | Status: AC
  Filled 2015-11-03: qty 5

## 2015-11-03 MED ORDER — LIDOCAINE-EPINEPHRINE (PF) 1 %-1:200000 IJ SOLN
INTRAMUSCULAR | Status: DC | PRN
  Administered 2015-11-03: 10 mL via INTRADERMAL

## 2015-11-03 MED ORDER — FENTANYL CITRATE (PF) 100 MCG/2ML IJ SOLN
50.0000 ug | Freq: Once | INTRAMUSCULAR | Status: AC
  Administered 2015-11-03: 50 ug via INTRAVENOUS

## 2015-11-03 MED ORDER — ACETAMINOPHEN 325 MG PO TABS: 325.0000 mg | ORAL_TABLET | ORAL | Status: DC | PRN

## 2015-11-03 MED ORDER — OXYCODONE-ACETAMINOPHEN 5-325 MG PO TABS: 1.0000 | ORAL_TABLET | ORAL | Status: DC | PRN

## 2015-11-03 MED ORDER — SODIUM CHLORIDE 0.9 % IV SOLN
INTRAVENOUS | Status: DC
  Administered 2015-11-03 (×2): via INTRAVENOUS

## 2015-11-03 MED ORDER — HYDROMORPHONE HCL 1 MG/ML IJ SOLN: 0.5000 mg | INTRAMUSCULAR | Status: DC | PRN

## 2015-11-03 MED ORDER — ONDANSETRON HCL 4 MG/2ML IJ SOLN: 4.0000 mg | Freq: Four times a day (QID) | INTRAMUSCULAR | Status: DC | PRN

## 2015-11-03 MED ORDER — PHENOL 1.4 % MT LIQD: 1.0000 | OROMUCOSAL | Status: DC | PRN

## 2015-11-03 MED ORDER — HEPARIN (PORCINE) IN NACL 2-0.9 UNIT/ML-% IJ SOLN
INTRAMUSCULAR | Status: AC
  Filled 2015-11-03: qty 1000

## 2015-11-03 MED ORDER — SODIUM CHLORIDE 0.9 % IV SOLN: 500.0000 mL | Freq: Once | INTRAVENOUS | Status: DC | PRN

## 2015-11-03 MED ORDER — FAMOTIDINE 20 MG PO TABS: 40.0000 mg | ORAL_TABLET | ORAL | Status: DC | PRN

## 2015-11-03 MED ORDER — HYDROMORPHONE HCL 1 MG/ML IJ SOLN: 1.0000 mg | Freq: Once | INTRAMUSCULAR | Status: DC

## 2015-11-03 MED ORDER — METHYLPREDNISOLONE SODIUM SUCC 125 MG IJ SOLR: 125.0000 mg | INTRAMUSCULAR | Status: DC | PRN

## 2015-11-03 MED ORDER — ACETAMINOPHEN 325 MG RE SUPP: 325.0000 mg | RECTAL | Status: DC | PRN

## 2015-11-03 MED ORDER — DEXTROSE 5 % IV SOLN
1.5000 g | INTRAVENOUS | Status: AC
  Administered 2015-11-03: 1.5 g via INTRAVENOUS

## 2015-11-03 MED ORDER — DEXTROSE 5 % IV SOLN
INTRAVENOUS | Status: DC
  Filled 2015-11-03: qty 500

## 2015-11-03 MED ORDER — HEPARIN SODIUM (PORCINE) 1000 UNIT/ML IJ SOLN
INTRAMUSCULAR | Status: AC
  Filled 2015-11-03: qty 1

## 2015-11-03 MED ORDER — SODIUM BICARBONATE BOLUS VIA INFUSION
INTRAVENOUS | Status: AC
  Administered 2015-11-03: 13:00:00 via INTRAVENOUS
  Filled 2015-11-03: qty 1

## 2015-11-03 MED ORDER — MIDAZOLAM HCL 2 MG/2ML IJ SOLN
INTRAMUSCULAR | Status: DC | PRN
  Administered 2015-11-03: 1 mg via INTRAVENOUS
  Administered 2015-11-03: 2 mg via INTRAVENOUS

## 2015-11-03 MED ORDER — METOPROLOL TARTRATE 1 MG/ML IV SOLN: 2.0000 mg | INTRAVENOUS | Status: DC | PRN

## 2015-11-03 MED ORDER — LIDOCAINE-EPINEPHRINE (PF) 1 %-1:200000 IJ SOLN
INTRAMUSCULAR | Status: AC
  Filled 2015-11-03: qty 30

## 2015-11-03 MED ORDER — HYDRALAZINE HCL 20 MG/ML IJ SOLN
5.0000 mg | INTRAMUSCULAR | Status: DC | PRN
Start: 2015-11-03 — End: 2015-11-03

## 2015-11-03 MED ORDER — LABETALOL HCL 5 MG/ML IV SOLN: 10.0000 mg | INTRAVENOUS | Status: DC | PRN

## 2015-11-03 MED ORDER — GUAIFENESIN-DM 100-10 MG/5ML PO SYRP: 15.0000 mL | ORAL_SOLUTION | ORAL | Status: DC | PRN

## 2015-11-03 MED ORDER — FENTANYL CITRATE (PF) 100 MCG/2ML IJ SOLN
INTRAMUSCULAR | Status: AC
  Filled 2015-11-03: qty 2

## 2015-11-03 MED ORDER — IOHEXOL 300 MG/ML  SOLN
INTRAMUSCULAR | Status: DC | PRN
  Administered 2015-11-03: 70 mL via INTRA_ARTERIAL

## 2015-11-03 SURGICAL SUPPLY — 11 items
CATH 5FR REUT (CATHETERS) ×5 IMPLANT
CATH CXI 4F 90 DAV (MICROCATHETER) ×5 IMPLANT
CATH PIG 70CM (CATHETERS) ×5 IMPLANT
DEVICE STARCLOSE SE CLOSURE (Vascular Products) ×5 IMPLANT
GLIDECATH 4FR STR (CATHETERS) ×5 IMPLANT
GUIDEWIRE ANGLED .035 180CM (WIRE) ×5 IMPLANT
PACK ANGIOGRAPHY (CUSTOM PROCEDURE TRAY) ×5 IMPLANT
SHEATH BRITE TIP 5FRX11 (SHEATH) ×5 IMPLANT
SYR MEDRAD MARK V 150ML (SYRINGE) ×5 IMPLANT
TUBING CONTRAST HIGH PRESS 72 (TUBING) ×5 IMPLANT
WIRE J 3MM .035X145CM (WIRE) ×5 IMPLANT

## 2015-11-03 NOTE — Progress Notes (Signed)
Pt clinically stable post procedure, vss, Dr Lucky Cowboy out to speak with patient and wife. Questions answered, not able to complete procedure today secondarily to major blockage to leg and kidney function not allowing for more contrast, to come back next week for next procedure. No bleeding nor hematoma at right groin,

## 2015-11-03 NOTE — Discharge Instructions (Signed)
Angiogram, Care After °Refer to this sheet in the next few weeks. These instructions provide you with information about caring for yourself after your procedure. Your health care provider may also give you more specific instructions. Your treatment has been planned according to current medical practices, but problems sometimes occur. Call your health care provider if you have any problems or questions after your procedure. °WHAT TO EXPECT AFTER THE PROCEDURE °After your procedure, it is typical to have the following: °· Bruising at the catheter insertion site that usually fades within 1-2 weeks. °· Blood collecting in the tissue (hematoma) that may be painful to the touch. It should usually decrease in size and tenderness within 1-2 weeks. °HOME CARE INSTRUCTIONS °· Take medicines only as directed by your health care provider. °· You may shower 24-48 hours after the procedure or as directed by your health care provider. Remove the bandage (dressing) and gently wash the site with plain soap and water. Pat the area dry with a clean towel. Do not rub the site, because this may cause bleeding. °· Do not take baths, swim, or use a hot tub until your health care provider approves. °· Check your insertion site every day for redness, swelling, or drainage. °· Do not apply powder or lotion to the site. °· Do not lift over 10 lb (4.5 kg) for 5 days after your procedure or as directed by your health care provider. °· Ask your health care provider when it is okay to: °¨ Return to work or school. °¨ Resume usual physical activities or sports. °¨ Resume sexual activity. °· Do not drive home if you are discharged the same day as the procedure. Have someone else drive you. °· You may drive 24 hours after the procedure unless otherwise instructed by your health care provider. °· Do not operate machinery or power tools for 24 hours after the procedure or as directed by your health care provider. °· If your procedure was done as an  outpatient procedure, which means that you went home the same day as your procedure, a responsible adult should be with you for the first 24 hours after you arrive home. °· Keep all follow-up visits as directed by your health care provider. This is important. °SEEK MEDICAL CARE IF: °· You have a fever. °· You have chills. °· You have increased bleeding from the catheter insertion site. Hold pressure on the site. °SEEK IMMEDIATE MEDICAL CARE IF: °· You have unusual pain at the catheter insertion site. °· You have redness, warmth, or swelling at the catheter insertion site. °· You have drainage (other than a small amount of blood on the dressing) from the catheter insertion site. °· The catheter insertion site is bleeding, and the bleeding does not stop after 30 minutes of holding steady pressure on the site. °· The area near or just beyond the catheter insertion site becomes pale, cool, tingly, or numb. °  °This information is not intended to replace advice given to you by your health care provider. Make sure you discuss any questions you have with your health care provider. °  °Document Released: 03/16/2005 Document Revised: 09/18/2014 Document Reviewed: 01/29/2013 °Elsevier Interactive Patient Education ©2016 [employer] ° °

## 2015-11-03 NOTE — Op Note (Signed)
[facility] Percutaneous Study/Intervention Procedural Note   Date of Surgery: 11/03/2015  Surgeon(s):Jadesola Poynter   Assistants:none  Pre-operative Diagnosis: PAD with ulceration left lower extremity  Post-operative diagnosis: Same  Procedure(s) Performed: 1. Ultrasound guidance for vascular access right femoral artery 2. Catheter placement into left superficial femoral artery from right femoral approach 3. Aortogram and selective left lower extremity angiogram 4. StarClose closure device right femoral artery  EBL: Minimal  Contrast: 70 cc  Fluro Time: 7.8 minutes  Moderate Conscious Sedation Time: approximately 30 minutes using 3 mg of Versed and 100 mcg of Fentanyl  Indications: Patient is a 75 y.o.male with multiple ulcerations as well as dark discoloration consistent with ulceration and dry gangrenous changes on the left foot. The patient has nonpalpable pedal pulses. The patient is brought in for angiography for further evaluation and potential treatment. Risks and benefits are discussed and informed consent is obtained  Procedure: The patient was identified and appropriate procedural time out was performed. The patient was then placed supine on the table and prepped and draped in the usual sterile fashion.Moderate conscious sedation was administered during a face to face encounter with the patient throughout the procedure with my supervision of the RN administering medicines and monitoring the patient's vital signs, pulse oximetry, telemetry and mental status throughout from the start of the procedure until the patient was taken to the recovery room. Ultrasound was used to evaluate the right common femoral artery. It was patent . A digital ultrasound image was acquired. A Seldinger needle was used to access the right common femoral artery under direct ultrasound guidance and a permanent  image was performed. A 0.035 J wire was advanced without resistance and a 5Fr sheath was placed. Pigtail catheter was placed into the aorta and an AP aortogram was performed. This demonstrated a patent aortobiiliac bypass creating a steep Up & Over angle, no significant aortoiliac stenosis, renal arteries appeared patent without obvious stenosis identified . I then crossed the aortic bifurcation and advanced to the left femoral head. This was tedious due to the Up & Over angle and multiple catheters and wires were ultimately used. A VS 1 catheter exchanged for a floppy Glidewire was used to gain wire purchase. A straight glide catheter and later a CXI catheter were used. Imaging was initially taken from the common femoral artery but later advanced into the proximal superficial femoral artery about 10 cm beyond its origin and hope to gain purchase with the CXI catheter. Selective left lower extremity angiogram was then performed. This demonstrated occlusion of the superficial femoral artery in the proximal to mid segment with reconstitution of the popliteal artery just above the anterior tibial artery origin which was diseased heavily. The anterior tibial artery was the only runoff distally. 2500 units of intravenous heparin were given for some level of anticoagulation. Even with the CXI catheter well down the SFA, any attempts to put a stiffer wire resulted in retraction of the catheter and wire back into the aorta. The difficulties from the steep angulation creating inability to go up and over with the treatment sheath as well as his renal insufficiency limiting our ability to get contrast today necessitated this be a diagnostic study and any further attempts at revascularization will likely have to be approached from an ipsilateral access. I elected to terminate the procedure. The sheath was removed and StarClose closure device was deployed in the left femoral artery with excellent hemostatic result. The patient  was taken to the recovery room  in stable condition having tolerated the procedure well.  Findings:  Aortogram: Patent aortobiiliac bypass creating a steep Up & Over angle, no significant aortoiliac stenosis, renal arteries appeared patent without obvious stenosis identified Left Lower Extremity: This demonstrated occlusion of the superficial femoral artery in the proximal to mid segment with reconstitution of the popliteal artery just above the anterior tibial artery origin which was diseased heavily. The anterior tibial artery was the only runoff distally.   Disposition: Patient was taken to the recovery room in stable condition having tolerated the procedure well.  Complications: None  Kerstie Agent 11/03/2015 1:40 PM

## 2015-11-03 NOTE — H&P (Signed)
  [facility] History & Physical Update  The patient was interviewed and re-examined.  The patient's previous History and Physical has been reviewed and is unchanged.  There is no change in the plan of care. We plan to proceed with the scheduled procedure.  DEW,JASON, MD  11/03/2015, 1:34 PM

## 2015-11-04 ENCOUNTER — Encounter: Payer: Self-pay | Admitting: Vascular Surgery

## 2015-11-04 NOTE — Discharge Summary (Signed)
[facility] [place] NAME: Collin [name]    MR#:  [id number]  DATE OF BIRTH:  Jan 05, 1941  DATE OF ADMISSION:  10/26/2015 ADMITTING PHYSICIAN: Samson Frederic, DO  DATE OF DISCHARGE: 10/29/2015  2:25 PM  PRIMARY CARE PHYSICIAN: [name], MD   ADMISSION DIAGNOSIS:  Gangrene of toe ([place]) [I96] Cellulitis of left foot [id number] COPD with acute exacerbation (HCC) [J44.1] HCAP (healthcare-associated pneumonia) [J18.9]  DISCHARGE DIAGNOSIS:  Principal Problem:   Pneumonia Active Problems:   Recurrent right pleural effusion   COPD (chronic obstructive pulmonary disease) (HCC)   HTN (hypertension)   Sepsis ([place])   Chronic respiratory failure with hypoxia (HCC)   Cellulitis of left foot   Gangrenous toe ([place])   SECONDARY DIAGNOSIS:   Past Medical History  Diagnosis Date  . COPD (chronic obstructive pulmonary disease) ([place])   . Chronic systolic CHF (congestive heart failure) ([place])   . Kidney stone   . Macular degeneration   . HLD (hyperlipidemia)   . Hypertensive retinopathy   . HTN (hypertension)   . GERD (gastroesophageal reflux disease)      ADMITTING HISTORY  Collin [name] is a 75 y.o. male with a known history of COPD, chronic hypoxic respiratory failure on 2L , recurrent pleural effusion with right PleurX catheter, nephrostomy tube presents with worsening dyspnea x few days and worsening left foot pain. He admits to chronic cough, however last few days it has become constant, severe, with sputum color change - thick yellow - associated with severe dyspnea on minimal exertion and constant wheezing. No improvement with home medications. In ED noted to have pneumonia. He has received, Vanc, Levaquin, Zosyn, blood cultures.  Medical records were reviewed; additionally about 2 months ago he dropped a can of sauerkraut on his left foot, subsequently has had constant, moderate, aching pain, associated with redness; no  aggravating factors. He was seen in the ED 3 weeks ago for it and received Keflex without improvement. He has continued to have pain in the foot and there has been color change to the left 5th toe.   Admits to chills. No fevers. Admits to b/l "cold feeling: to bilateral legs from knee to toes; + pain in calves with walking.  HOSPITAL COURSE:    75 year old male with past medical history COPD, chronic systolic CHF, history of nephrolithiasis, history of macular degeneration, hypertension, hyperlipidemia who presents to the hospital due to shortness of breath and noted to have a left Toe gangrene.   # Acute on chronic resp. Failure - Related to pneumonia w/ pleural effusion.  - pt. Already has a right pleurx cath in place for drainage.  - continued Broad spectrum Abx w/ Zosyn, Levaquin. Augmentin orally after discharge - appreciate Pulm. Input and cont  #2 left toe gangrene patient was seen by vascular surgery and did recommend doing an angiogram after his nephrostomy tube is replaced -Continued broad-spectrum IV antibiotics with Zosyn, Levaquin in the hospital. Change to Augmentin at discharge. Patient will return on Monday for his angiogram with Dr. Lucky Cowboy. Dr. dew prior to discharge.  # COPD-continue duo nebs, Dulera, Spiriva. - No wheezing  # anxiety-continue as needed Xanax.  # chronic kidney disease stage III-creatinine at baseline  # history of nephrolithiasis status post ureteral stent-patient has a nephrostomy tube which is draining well.  IR replaced nephrostomy tube -Continue follow-up with urology as outpatient.   Stable for discharge home to follow-up with urology, pulmonology, vascular and primary care  physician.   CONSULTS OBTAINED:  Treatment Team:  Algernon Huxley, MD Erby Pian, MD  DRUG ALLERGIES:   Allergies  Allergen Reactions  . Lisinopril Anaphylaxis  . Metoprolol Other (See Comments)    Reaction:  Ringing in ears     DISCHARGE MEDICATIONS:    Discharge Medication List as of 10/29/2015  1:10 PM    START taking these medications   Details  amoxicillin-clavulanate (AUGMENTIN) 875-125 MG tablet Take 1 tablet by mouth 2 (two) times daily., Starting 10/29/2015, Until Discontinued, Normal      CONTINUE these medications which have CHANGED   Details  ALPRAZolam (XANAX) 0.25 MG tablet Take 1 tablet (0.25 mg total) by mouth 2 (two) times daily as needed for anxiety or sleep., Starting 10/29/2015, Until Discontinued, Print    oxyCODONE-acetaminophen (ROXICET) 5-325 MG tablet Take 1 tablet by mouth every 6 (six) hours as needed., Starting 10/29/2015, Until Discontinued, Print      CONTINUE these medications which have NOT CHANGED   Details  albuterol (PROVENTIL HFA;VENTOLIN HFA) 108 (90 BASE) MCG/ACT inhaler Inhale 1-2 puffs into the lungs every 4 (four) hours as needed for wheezing or shortness of breath. , Until Discontinued, Historical Med    aspirin EC 81 MG tablet Take 81 mg by mouth daily., Until Discontinued, Historical Med    carvedilol (COREG) 6.25 MG tablet Take 6.25 mg by mouth 2 (two) times daily. , Until Discontinued, Historical Med    Fluticasone-Salmeterol (ADVAIR) 100-50 MCG/DOSE AEPB Inhale 1 puff into the lungs 2 (two) times daily., Until Discontinued, Historical Med    furosemide (LASIX) 40 MG tablet Take 40 mg by mouth 3 (three) times daily., Until Discontinued, Historical Med    Multiple Vitamin (MULTIVITAMIN WITH MINERALS) TABS tablet Take 1 tablet by mouth daily., Until Discontinued, Historical Med    nitroGLYCERIN (NITROSTAT) 0.4 MG SL tablet Take 0.4 mg by mouth every 5 (five) minutes x 3 doses as needed for chest pain. , Until Discontinued, Historical Med    omeprazole (PRILOSEC) 20 MG capsule Take 20 mg by mouth 2 (two) times daily. , Until Discontinued, Historical Med    tiotropium (SPIRIVA HANDIHALER) 18 MCG inhalation capsule Place 1 capsule (18 mcg total) into inhaler and inhale daily., Starting  03/06/2015, Until Discontinued, Print        Today   VITAL SIGNS:  Blood pressure 126/94, pulse 99, temperature 97.7 F (36.5 C), temperature source Oral, resp. rate 16, height 6\' 2"  (1.88 m), weight 85.866 kg (189 lb 4.8 oz), SpO2 100 %.  I/O:  No intake or output data in the 24 hours ending 11/04/15 0357  PHYSICAL EXAMINATION:  Physical Exam  GENERAL:  75 y.o.-year-old patient lying in the bed with no acute distress.  LUNGS: Normal breath sounds bilaterally, no wheezing, rales,rhonchi or crepitation. No use of accessory muscles of respiration.  CARDIOVASCULAR: S1, S2 normal. No murmurs, rubs, or gallops.  ABDOMEN: Soft, non-tender, non-distended. Bowel sounds present. No organomegaly or mass.  NEUROLOGIC: Moves all 4 extremities. PSYCHIATRIC: The patient is alert and oriented x 3.  SKIN: Left toe gangrene  DATA REVIEW:   CBC  Recent Labs Lab 10/28/15 0456  WBC 8.3  HGB 12.5*  HCT 39.9*  PLT 281    Chemistries   Recent Labs Lab 10/28/15 0456 11/02/15 1455  NA 133*  --   K 4.2  --   CL 102  --   CO2 22  --   GLUCOSE 122*  --   BUN 33* 40*  CREATININE 1.62* 1.86*  CALCIUM 8.3*  --     Cardiac Enzymes No results for input(s): TROPONINI in the last 168 hours.  Microbiology Results  Results for orders placed or performed during the hospital encounter of 10/26/15  Culture, blood (routine x 2)     Status: None   Collection Time: 10/26/15  3:52 PM  Result Value Ref Range Status   Specimen Description BLOOD RIGHT FATTY CASTS  Final   Special Requests BOTTLES DRAWN AEROBIC AND ANAEROBIC [place]  Final   Culture NO GROWTH 5 DAYS  Final   Report Status 10/31/2015 FINAL  Final  Culture, blood (routine x 2)     Status: None   Collection Time: 10/26/15  3:56 PM  Result Value Ref Range Status   Specimen Description BLOOD LEFT ARM  Final   Special Requests BOTTLES DRAWN AEROBIC AND ANAEROBIC 2CCAERO,1CCANA  Final   Culture NO GROWTH 5 DAYS  Final   Report  Status 10/31/2015 FINAL  Final  Culture, expectorated sputum-assessment     Status: None   Collection Time: 10/27/15  4:50 PM  Result Value Ref Range Status   Specimen Description SPUTUM  Final   Special Requests NONE  Final   Sputum evaluation THIS SPECIMEN IS ACCEPTABLE FOR SPUTUM CULTURE  Final   Report Status 10/27/2015 FINAL  Final  Culture, respiratory (NON-Expectorated)     Status: None   Collection Time: 10/27/15  4:50 PM  Result Value Ref Range Status   Specimen Description SPUTUM  Final   Special Requests NONE Reflexed from [id number]  Final   Gram Stain   Final    GOOD SPECIMEN - 80-90% WBCS FEW SQUAMOUS EPITHELIAL CELLS PRESENT MODERATE WBC SEEN FEW GRAM POSITIVE COCCI IN CLUSTERS IN PAIRS RARE GRAM POSITIVE RODS RARE GRAM NEGATIVE RODS RARE GRAM NEGATIVE COCCOBACILLI    Culture Consistent with normal respiratory flora.  Final   Report Status 10/30/2015 FINAL  Final    RADIOLOGY:  No results found.  Follow up with PCP in 1 week.  Management plans discussed with the patient, family and they are in agreement.  CODE STATUS:  Code Status History    Date Active Date Inactive Code Status Order ID Comments User Context   10/26/2015 10:08 PM 10/29/2015  5:25 PM Full Code [id number]  Samson Frederic, DO Inpatient   08/22/2015  5:44 PM 08/31/2015  4:39 PM Full Code [id number]  Theodoro Grist, MD Inpatient   07/07/2015  1:57 PM 07/08/2015  2:28 PM Full Code [id number]  Nestor Lewandowsky, MD Inpatient   06/12/2015  3:54 PM 06/14/2015  5:01 PM Full Code [id number]  Baxter Hire, MD Inpatient   03/20/2015 12:20 AM 03/21/2015  9:25 PM Full Code [id number]  Lance Coon, MD ED   03/04/2015  5:52 AM 03/06/2015  2:26 PM Full Code [id number]  Lance Coon, MD Inpatient      TOTAL TIME TAKING CARE OF THIS PATIENT ON DAY OF DISCHARGE: more than 30 minutes.   Hillary Bow R M.D on 11/04/2015 at 3:57 AM  Between 7am to 6pm - Pager - [phone]  After 6pm go to [email or web address] - password [email or web address]  [facility]  Office  [phone]  CC: Primary care physician; [name], MD  Note: This dictation was prepared with Dragon dictation along with smaller phrase technology. Any transcriptional errors that result from this process are unintentional.

## 2015-11-05 ENCOUNTER — Telehealth: Payer: Self-pay | Admitting: Radiology

## 2015-11-05 NOTE — Telephone Encounter (Signed)
Notified pt's wife, Silva Bandy, that per Dr Erlene Quan we are postponing his surgery for now. Appt made to RTC in 1 month per Dr Erlene Quan. Phyllis voices understanding.

## 2015-11-08 ENCOUNTER — Inpatient Hospital Stay: Admission: RE | Admit: 2015-11-08 | Payer: Medicare Other | Source: Ambulatory Visit

## 2015-11-08 ENCOUNTER — Other Ambulatory Visit: Payer: Self-pay | Admitting: Vascular Surgery

## 2015-11-10 ENCOUNTER — Encounter
Admission: RE | Disposition: A | Discharge status: Home / Self Care | Payer: Self-pay | Source: Ambulatory Visit | Attending: Vascular Surgery

## 2015-11-10 ENCOUNTER — Encounter: Payer: Self-pay | Admitting: *Deleted

## 2015-11-10 ENCOUNTER — Ambulatory Visit
Admission: RE | Admit: 2015-11-10 | Discharge: 2015-11-10 | Disposition: A | Discharge status: Home / Self Care | Payer: Medicare Other | Source: Ambulatory Visit | Attending: Vascular Surgery | Admitting: Vascular Surgery

## 2015-11-10 DIAGNOSIS — J449 Chronic obstructive pulmonary disease, unspecified: Secondary | ICD-10-CM | POA: Insufficient documentation

## 2015-11-10 DIAGNOSIS — N200 Calculus of kidney: Secondary | ICD-10-CM | POA: Diagnosis not present

## 2015-11-10 DIAGNOSIS — F17210 Nicotine dependence, cigarettes, uncomplicated: Secondary | ICD-10-CM | POA: Diagnosis not present

## 2015-11-10 DIAGNOSIS — E785 Hyperlipidemia, unspecified: Secondary | ICD-10-CM | POA: Insufficient documentation

## 2015-11-10 DIAGNOSIS — I10 Essential (primary) hypertension: Secondary | ICD-10-CM | POA: Diagnosis not present

## 2015-11-10 DIAGNOSIS — J90 Pleural effusion, not elsewhere classified: Secondary | ICD-10-CM | POA: Insufficient documentation

## 2015-11-10 DIAGNOSIS — Z9889 Other specified postprocedural states: Secondary | ICD-10-CM | POA: Insufficient documentation

## 2015-11-10 DIAGNOSIS — H3530 Unspecified macular degeneration: Secondary | ICD-10-CM | POA: Diagnosis not present

## 2015-11-10 DIAGNOSIS — I509 Heart failure, unspecified: Secondary | ICD-10-CM | POA: Insufficient documentation

## 2015-11-10 DIAGNOSIS — I96 Gangrene, not elsewhere classified: Secondary | ICD-10-CM | POA: Diagnosis not present

## 2015-11-10 DIAGNOSIS — H35039 Hypertensive retinopathy, unspecified eye: Secondary | ICD-10-CM | POA: Diagnosis not present

## 2015-11-10 DIAGNOSIS — I70262 Atherosclerosis of native arteries of extremities with gangrene, left leg: Secondary | ICD-10-CM | POA: Insufficient documentation

## 2015-11-10 DIAGNOSIS — I714 Abdominal aortic aneurysm, without rupture: Secondary | ICD-10-CM | POA: Insufficient documentation

## 2015-11-10 DIAGNOSIS — I70248 Atherosclerosis of native arteries of left leg with ulceration of other part of lower left leg: Secondary | ICD-10-CM | POA: Diagnosis not present

## 2015-11-10 HISTORY — PX: PERIPHERAL VASCULAR CATHETERIZATION: SHX172C

## 2015-11-10 SURGERY — LOWER EXTREMITY ANGIOGRAPHY
Anesthesia: Moderate Sedation | Laterality: Left

## 2015-11-10 MED ORDER — FAMOTIDINE 20 MG PO TABS: 40.0000 mg | ORAL_TABLET | ORAL | Status: DC | PRN

## 2015-11-10 MED ORDER — LABETALOL HCL 5 MG/ML IV SOLN: 10.0000 mg | INTRAVENOUS | Status: DC | PRN

## 2015-11-10 MED ORDER — ONDANSETRON HCL 4 MG/2ML IJ SOLN: 4.0000 mg | Freq: Four times a day (QID) | INTRAMUSCULAR | Status: DC | PRN

## 2015-11-10 MED ORDER — MIDAZOLAM HCL 5 MG/5ML IJ SOLN
INTRAMUSCULAR | Status: AC
  Filled 2015-11-10: qty 5

## 2015-11-10 MED ORDER — HEPARIN SODIUM (PORCINE) 1000 UNIT/ML IJ SOLN
INTRAMUSCULAR | Status: DC | PRN
  Administered 2015-11-10: 4000 [IU] via INTRAVENOUS

## 2015-11-10 MED ORDER — OXYCODONE-ACETAMINOPHEN 5-325 MG PO TABS: 1.0000 | ORAL_TABLET | ORAL | Status: DC | PRN

## 2015-11-10 MED ORDER — FENTANYL CITRATE (PF) 100 MCG/2ML IJ SOLN
INTRAMUSCULAR | Status: AC
  Filled 2015-11-10: qty 2

## 2015-11-10 MED ORDER — GUAIFENESIN-DM 100-10 MG/5ML PO SYRP: 15.0000 mL | ORAL_SOLUTION | ORAL | Status: DC | PRN

## 2015-11-10 MED ORDER — HYDRALAZINE HCL 20 MG/ML IJ SOLN: 5.0000 mg | INTRAMUSCULAR | Status: DC | PRN

## 2015-11-10 MED ORDER — CLOPIDOGREL BISULFATE 75 MG PO TABS
75.0000 mg | ORAL_TABLET | Freq: Once | ORAL | Status: AC
  Administered 2015-11-10: 75 mg via ORAL

## 2015-11-10 MED ORDER — HEPARIN SODIUM (PORCINE) 1000 UNIT/ML IJ SOLN
INTRAMUSCULAR | Status: AC
  Filled 2015-11-10: qty 1

## 2015-11-10 MED ORDER — FLUCONAZOLE IN SODIUM CHLORIDE 400-0.9 MG/200ML-% IV SOLN: 400.0000 mg | Freq: Once | INTRAVENOUS | Status: DC

## 2015-11-10 MED ORDER — HEPARIN (PORCINE) IN NACL 2-0.9 UNIT/ML-% IJ SOLN
INTRAMUSCULAR | Status: AC
  Filled 2015-11-10: qty 1000

## 2015-11-10 MED ORDER — IOHEXOL 300 MG/ML  SOLN
INTRAMUSCULAR | Status: DC | PRN
  Administered 2015-11-10: 105 mL via INTRA_ARTERIAL

## 2015-11-10 MED ORDER — GENTAMICIN SULFATE 40 MG/ML IJ SOLN: 80.0000 mg | Freq: Once | INTRAVENOUS | Status: DC

## 2015-11-10 MED ORDER — PHENOL 1.4 % MT LIQD: 1.0000 | OROMUCOSAL | Status: DC | PRN

## 2015-11-10 MED ORDER — CEFTRIAXONE SODIUM 1 G IJ SOLR: 1.0000 g | Freq: Once | INTRAMUSCULAR | Status: DC

## 2015-11-10 MED ORDER — LIDOCAINE-EPINEPHRINE (PF) 1 %-1:200000 IJ SOLN
INTRAMUSCULAR | Status: DC | PRN
  Administered 2015-11-10: 10 mL via INTRADERMAL

## 2015-11-10 MED ORDER — METHYLPREDNISOLONE SODIUM SUCC 125 MG IJ SOLR: 125.0000 mg | INTRAMUSCULAR | Status: DC | PRN

## 2015-11-10 MED ORDER — ACETAMINOPHEN 325 MG PO TABS: 325.0000 mg | ORAL_TABLET | ORAL | Status: DC | PRN

## 2015-11-10 MED ORDER — SODIUM CHLORIDE 0.9 % IV SOLN: 500.0000 mL | Freq: Once | INTRAVENOUS | Status: DC | PRN

## 2015-11-10 MED ORDER — LIDOCAINE-EPINEPHRINE (PF) 1 %-1:200000 IJ SOLN
INTRAMUSCULAR | Status: AC
  Filled 2015-11-10: qty 30

## 2015-11-10 MED ORDER — ACETAMINOPHEN 325 MG RE SUPP: 325.0000 mg | RECTAL | Status: DC | PRN

## 2015-11-10 MED ORDER — HYDROMORPHONE HCL 1 MG/ML IJ SOLN
0.5000 mg | INTRAMUSCULAR | Status: DC | PRN
  Administered 2015-11-10: 1 mg via INTRAVENOUS

## 2015-11-10 MED ORDER — HYDROMORPHONE HCL 1 MG/ML IJ SOLN
INTRAMUSCULAR | Status: AC
  Filled 2015-11-10: qty 1

## 2015-11-10 MED ORDER — FENTANYL CITRATE (PF) 100 MCG/2ML IJ SOLN
INTRAMUSCULAR | Status: DC | PRN
  Administered 2015-11-10 (×3): 50 ug via INTRAVENOUS

## 2015-11-10 MED ORDER — SODIUM CHLORIDE 0.9 % IV SOLN
INTRAVENOUS | Status: DC
Start: 2015-11-10 — End: 2015-11-10
  Administered 2015-11-10: 13:00:00 via INTRAVENOUS

## 2015-11-10 MED ORDER — DEXTROSE 5 % IV SOLN
1.5000 g | INTRAVENOUS | Status: AC
  Administered 2015-11-10: 1.5 g via INTRAVENOUS

## 2015-11-10 MED ORDER — CLOPIDOGREL BISULFATE 75 MG PO TABS
ORAL_TABLET | ORAL | Status: AC
  Administered 2015-11-10: 75 mg via ORAL
  Filled 2015-11-10: qty 1

## 2015-11-10 MED ORDER — MIDAZOLAM HCL 2 MG/2ML IJ SOLN
INTRAMUSCULAR | Status: DC | PRN
  Administered 2015-11-10: 1 mg via INTRAVENOUS
  Administered 2015-11-10: 2 mg via INTRAVENOUS

## 2015-11-10 MED ORDER — HYDROMORPHONE HCL 1 MG/ML IJ SOLN: 1.0000 mg | Freq: Once | INTRAMUSCULAR | Status: DC

## 2015-11-10 SURGICAL SUPPLY — 20 items
BALLN LUTONIX 5X150X130 (BALLOONS) ×8
BALLN ULTRVRSE 2.5X300X150 (BALLOONS) ×4
BALLN ULTRVRSE 3X80X150 (BALLOONS) ×2
BALLN ULTRVRSE 3X80X150 OTW (BALLOONS) ×2
BALLN ULTRVRSE 5X220X150 (BALLOONS) ×4
BALLOON LUTONIX 5X150X130 (BALLOONS) ×4 IMPLANT
BALLOON ULTRVRSE 2.5X300X150 (BALLOONS) ×2 IMPLANT
BALLOON ULTRVRSE 3X80X150 OTW (BALLOONS) ×2 IMPLANT
BALLOON ULTRVRSE 5X220X150 (BALLOONS) ×2 IMPLANT
CANNULA 5F STIFF (CANNULA) ×8 IMPLANT
CATH CXI 4F 90 DAV (MICROCATHETER) ×4 IMPLANT
CATH VERT 100CM (CATHETERS) ×4 IMPLANT
DEVICE PRESTO INFLATION (MISCELLANEOUS) ×4 IMPLANT
GLIDEWIRE ADV .035X260CM (WIRE) ×4 IMPLANT
PACK ANGIOGRAPHY (CUSTOM PROCEDURE TRAY) ×4 IMPLANT
SHEATH ANL2 6FRX45 HC (SHEATH) ×4 IMPLANT
SHEATH BRITE TIP 5FRX11 (SHEATH) ×4 IMPLANT
SHEATH BRITE TIP 6FRX11 (SHEATH) ×4 IMPLANT
STENT VIABAHN 6X250X120 (Permanent Stent) ×8 IMPLANT
WIRE G V18X300CM (WIRE) ×4 IMPLANT

## 2015-11-10 NOTE — Op Note (Signed)
[facility] Percutaneous Study/Intervention Procedural Note   Date of Surgery: 11/10/2015  Surgeon(s):Sarrah Fiorenza   Assistants:none  Pre-operative Diagnosis: PAD with gangrene LLE  Post-operative diagnosis: Same  Procedure(s) Performed: 1. Ultrasound guidance for vascular access left femoral artery in an antegrade fashion 2. Catheter placement into left peroneal artery from left femoral approach 3. Selective left lower extremity angiogram 4. Percutaneous transluminal angioplasty of left peroneal artery with 2.5 mm diameter angioplasty in mid section and distally and 3 mm balloon proximally into the TP trunk 5. Percutaneous transluminal angioplasty of the left superficial femoral artery and popliteal artery 5 mm diameter angioplasty balloon including 2 inflations with Lutonix drug-coated angioplasty balloons  6.  Viabahn covered stent placement 2 to the left SFA and popliteal artery for greater than 50% residual stenosis, dissection, and thrombosis after angioplasty of the left superficial femoral artery and popliteal artery using 6 mm diameter by 25 cm length stents   EBL: 50 cc  Contrast: 105 cc  Fluro Time: 12.4 minutes  Moderate Conscious Sedation Time: approximately 90 minutes using 4 mg of Versed and 150 mcg of Fentanyl  Indications: Patient is a 75 y.o.male with gangrene of the left lower extremity and known severe peripheral vascular disease on previous angiogram. The patient is brought in for angiography from an antegrade approach due to a previous aortobiiliac bypass for further evaluation and potential treatment. Risks and benefits are discussed and informed consent is obtained  Procedure: The patient was identified and appropriate procedural time out was performed. The patient was then placed supine on the table and prepped and draped in the usual sterile  fashion.Moderate conscious sedation was administered during a face to face encounter with the patient throughout the procedure with my supervision of the RN administering medicines and monitoring the patient's vital signs, pulse oximetry, telemetry and mental status throughout from the start of the procedure until the patient was taken to the recovery room. Ultrasound was used to evaluate the left common femoral artery. It was patent . A digital ultrasound image was acquired. A Seldinger needle was used to access the left common femoral artery under direct ultrasound guidance in an antegrade fashion and a permanent image was performed. A 0.035 J wire was advanced without resistance and a 5Fr sheath was placed. Selective left lower extremity angiogram was then performed. This demonstrated long SFA occlusion and popliteal occlusion with reconstitution below the tibial trifurcation and tibial disease. The patient was systemically heparinized and a 6 [place] [employer] sheath was then placed over the [employer] wire. I then used a Kumpe catheter and the advantage wire to navigate through the occlusion and confirm intraluminal flow in the mid peroneal artery. I then placed a 0.018 wire and treated the peroneal artery throughout its course with a 2.5 mm diameter by 30 cm length angioplasty balloon. Proximally, a 3 mm diameter by 10 cm length angioplasty balloon was used and tibioperoneal trunk and proximal peroneal artery the popliteal artery and superficial femoral artery were treated with 5 mm diameter by 15 cm length Lutonix drug-coated angioplasty balloons with 2 inflations using drug-coated balloons and 2 inflations using a second inflation. Following these inflations, there was minimal flow distally with a long spiral dissection with thrombosis of the SFA and popliteal artery. I selected to 6 mm diameter by 25 cm length Viabahn covered stents and deployed these from the proximal superficial femoral artery  about 5 cm beyond the origin down to the below-knee popliteal artery. These were postdilated with  5 mm balloons. Completion angiogram showed brisk flow with in-line flow through the anterior tibial artery of the foot. The peroneal artery occluded in its proximal to mid segment and this did not improve with a second inflation with an angioplasty balloon, but he now had in-line flow to his foot which was markedly improved. Given this difficult situation, I felt we had improved his perfusion as much as possible. I elected to terminate the procedure. The sheath was removed and pressure was held in the left femoral artery with excellent hemostatic result. The patient was taken to the recovery room in stable condition having tolerated the procedure well.  Findings:   Left Lower Extremity: Long SFA occlusion with tibial disease   Disposition: Patient was taken to the recovery room in stable condition having tolerated the procedure well.  Complications: None  Yula Crotwell 11/10/2015 2:49 PM

## 2015-11-10 NOTE — H&P (Signed)
  [facility] History & Physical Update  The patient was interviewed and re-examined.  The patient's previous History and Physical has been reviewed and is unchanged.  There is no change in the plan of care. We plan to proceed with the scheduled procedure.  DEW,JASON, MD  11/10/2015, 11:59 AM

## 2015-11-11 ENCOUNTER — Encounter: Payer: Self-pay | Admitting: Vascular Surgery

## 2015-11-15 ENCOUNTER — Emergency Department: Payer: Medicaid Other

## 2015-11-15 ENCOUNTER — Encounter: Admission: RE | Payer: Self-pay | Source: Ambulatory Visit

## 2015-11-15 ENCOUNTER — Ambulatory Visit: Admission: RE | Admit: 2015-11-15 | Payer: Medicare Other | Source: Ambulatory Visit | Admitting: Urology

## 2015-11-15 ENCOUNTER — Encounter: Payer: Self-pay | Admitting: Emergency Medicine

## 2015-11-15 ENCOUNTER — Emergency Department
Admission: EM | Admit: 2015-11-15 | Discharge: 2015-11-15 | Disposition: A | Discharge status: Home / Self Care | Payer: Medicaid Other | Attending: Emergency Medicine | Admitting: Emergency Medicine

## 2015-11-15 DIAGNOSIS — L089 Local infection of the skin and subcutaneous tissue, unspecified: Secondary | ICD-10-CM | POA: Diagnosis not present

## 2015-11-15 DIAGNOSIS — I10 Essential (primary) hypertension: Secondary | ICD-10-CM | POA: Diagnosis not present

## 2015-11-15 DIAGNOSIS — M79672 Pain in left foot: Secondary | ICD-10-CM | POA: Insufficient documentation

## 2015-11-15 DIAGNOSIS — F17210 Nicotine dependence, cigarettes, uncomplicated: Secondary | ICD-10-CM | POA: Diagnosis not present

## 2015-11-15 DIAGNOSIS — H35039 Hypertensive retinopathy, unspecified eye: Secondary | ICD-10-CM | POA: Insufficient documentation

## 2015-11-15 DIAGNOSIS — Z7951 Long term (current) use of inhaled steroids: Secondary | ICD-10-CM | POA: Diagnosis not present

## 2015-11-15 DIAGNOSIS — Z7982 Long term (current) use of aspirin: Secondary | ICD-10-CM | POA: Insufficient documentation

## 2015-11-15 DIAGNOSIS — M79673 Pain in unspecified foot: Secondary | ICD-10-CM

## 2015-11-15 DIAGNOSIS — Z79899 Other long term (current) drug therapy: Secondary | ICD-10-CM | POA: Insufficient documentation

## 2015-11-15 DIAGNOSIS — R2242 Localized swelling, mass and lump, left lower limb: Secondary | ICD-10-CM | POA: Insufficient documentation

## 2015-11-15 HISTORY — DX: Peripheral vascular disease, unspecified: I73.9

## 2015-11-15 LAB — CBC WITH DIFFERENTIAL/PLATELET
BASOS PCT: 0 %
Basophils Absolute: 0 10*3/uL (ref 0–0.1)
EOS ABS: 0.2 10*3/uL (ref 0–0.7)
EOS PCT: 2 %
HCT: 38.6 % — ABNORMAL LOW (ref 40.0–52.0)
Hemoglobin: 12 g/dL — ABNORMAL LOW (ref 13.0–18.0)
Lymphocytes Relative: 8 %
Lymphs Abs: 0.9 10*3/uL — ABNORMAL LOW (ref 1.0–3.6)
MCH: 23.1 pg — ABNORMAL LOW (ref 26.0–34.0)
MCHC: 31 g/dL — AB (ref 32.0–36.0)
MCV: 74.4 fL — ABNORMAL LOW (ref 80.0–100.0)
MONO ABS: 0.8 10*3/uL (ref 0.2–1.0)
MONOS PCT: 8 %
Neutro Abs: 8.8 10*3/uL — ABNORMAL HIGH (ref 1.4–6.5)
Neutrophils Relative %: 82 %
Platelets: 278 10*3/uL (ref 150–440)
RBC: 5.19 MIL/uL (ref 4.40–5.90)
RDW: 22.4 % — AB (ref 11.5–14.5)
WBC: 10.7 10*3/uL — ABNORMAL HIGH (ref 3.8–10.6)

## 2015-11-15 LAB — BASIC METABOLIC PANEL
ANION GAP: 9 (ref 5–15)
BUN: 39 mg/dL — AB (ref 6–20)
CHLORIDE: 101 mmol/L (ref 101–111)
CO2: 29 mmol/L (ref 22–32)
Calcium: 8.5 mg/dL — ABNORMAL LOW (ref 8.9–10.3)
Creatinine, Ser: 1.91 mg/dL — ABNORMAL HIGH (ref 0.61–1.24)
GFR calc Af Amer: 38 mL/min — ABNORMAL LOW (ref >60)
GFR calc non Af Amer: 33 mL/min — ABNORMAL LOW (ref >60)
Glucose, Bld: 153 mg/dL — ABNORMAL HIGH (ref 65–99)
POTASSIUM: 4.3 mmol/L (ref 3.5–5.1)
SODIUM: 139 mmol/L (ref 135–145)

## 2015-11-15 LAB — PROTIME-INR
INR: 1.22
PROTHROMBIN TIME: 15.6 s — AB (ref 11.4–15.0)

## 2015-11-15 SURGERY — URETEROSCOPY, WITH LITHOTRIPSY USING HOLMIUM LASER
Anesthesia: Choice | Laterality: Right

## 2015-11-15 MED ORDER — AMOXICILLIN-POT CLAVULANATE 875-125 MG PO TABS: 1.0000 | ORAL_TABLET | Freq: Two times a day (BID) | ORAL | Status: AC

## 2015-11-15 MED ORDER — HYDROMORPHONE HCL 1 MG/ML IJ SOLN
1.0000 mg | Freq: Once | INTRAMUSCULAR | Status: AC
  Administered 2015-11-15: 1 mg via INTRAVENOUS
  Filled 2015-11-15: qty 1

## 2015-11-15 MED ORDER — DEXTROSE 5 % IV SOLN
1.0000 g | Freq: Once | INTRAVENOUS | Status: AC
  Administered 2015-11-15: 1 g via INTRAVENOUS
  Filled 2015-11-15: qty 10

## 2015-11-15 NOTE — ED Notes (Signed)
Lab called regarding add on Pt INR at this time, will add on.

## 2015-11-15 NOTE — ED Notes (Signed)
Left foot +1 swelling noted.  Anterior foot reddened.  Left 5th toe black and darker area of ecchymotic discoloration noted to anterior left foot.  Foot warm to touch.

## 2015-11-15 NOTE — ED Provider Notes (Addendum)
[facility] Emergency Department Provider Note  ____________________________________________   I have reviewed the triage vital signs and the nursing notes.   HISTORY  Chief Complaint Claudication and foot discoloration     HPI Collin [name] is a 75 y.o. male at a very significant, complicated past nuchal history including most particularly to this presentation dry gangrene of the left toe with poor circulation to that area. Patient is also had chronic swelling and irritation to that foot in general. This was all secondary to claudication, patient had a stent placed by Dr. dew last Wednesday and since that time he said gradual improvement in symptoms. However yesterday he felt that his foot was somewhat better he was going to get up and walk around a great deal and today it is swollen and painful again. He denies any fever or chills. The foot does not feel cold to him. He is on 7.5 hydrocodone which she states is insufficient to manage the pain that he was in at home, so he came in.He denies any nausea or vomiting or weakness. Patient does have a urostomy tube and CHF COPD and multiple other different medical problems.  Past Medical History  Diagnosis Date  . COPD (chronic obstructive pulmonary disease) ([place])   . Chronic systolic CHF (congestive heart failure) ([place])   . Kidney stone   . Macular degeneration   . HLD (hyperlipidemia)   . Hypertensive retinopathy   . HTN (hypertension)   . GERD (gastroesophageal reflux disease)   . Peripheral vascular disease of lower extremity [facility])     Patient Active Problem List   Diagnosis Date Noted  . Pneumonia 10/26/2015  . Sepsis ([place]) 10/26/2015  . Chronic respiratory failure with hypoxia ([place]) 10/26/2015  . Cellulitis of left foot 10/26/2015  . Gangrenous toe ([place]) 10/26/2015  . ARF (acute renal failure) ([place])   . Hydronephrosis   . UTI (lower urinary tract infection)   . Hydronephrosis with urinary obstruction  due to renal calculus   . Acute congestive heart failure ([place]) 08/22/2015  . 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 Acute on chronic respiratory failure ([place]) 06/12/2015  . Acute on chronic systolic CHF (congestive heart failure) ([place]) 03/19/2015  . Open wound of left heel 03/19/2015  . Recurrent right pleural effusion 03/04/2015  . Acute respiratory failure with hypoxemia ([place]) 03/04/2015  . COPD (chronic obstructive pulmonary disease) ([place]) 03/04/2015  . HLD (hyperlipidemia) 03/04/2015  . Chronic systolic CHF (congestive heart failure) ([place]) 03/04/2015  . HTN (hypertension) 03/04/2015  . GERD (gastroesophageal reflux disease) 03/04/2015    Past Surgical History  Procedure Laterality Date  . Abdominal aortic aneurysm repair    . Hernia repair    . Kidney stone surgery    . Chest tube insertion Right 07/07/2015    Procedure: INSERTION PLEURAL DRAINAGE CATHETER;  Surgeon: Nestor Lewandowsky, MD;  Location: [facility] ORS;  Service: Thoracic;  Laterality: Right;  . Peripheral vascular catheterization N/A 11/03/2015    Procedure: Abdominal Aortogram w/Lower Extremity;  Surgeon: Algernon Huxley, MD;  Location: [place] CV LAB;  Service: Cardiovascular;  Laterality: N/A;  . Peripheral vascular catheterization  11/03/2015    Procedure: Lower Extremity Intervention;  Surgeon: Algernon Huxley, MD;  Location: [place] CV LAB;  Service: Cardiovascular;;  . Peripheral vascular catheterization Left 11/10/2015    Procedure: Lower Extremity Angiography;  Surgeon: Algernon Huxley, MD;  Location: [place] CV LAB;  Service: Cardiovascular;  Laterality: Left;  . Peripheral vascular  catheterization  11/10/2015    Procedure: Lower Extremity Intervention;  Surgeon: Algernon Huxley, MD;  Location: [place] CV LAB;  Service: Cardiovascular;;  . Peripheral vascular stent Left     Current Outpatient Rx  Name  Route  Sig  Dispense  Refill  . albuterol (PROVENTIL HFA;VENTOLIN HFA) 108 (90 BASE) MCG/ACT  inhaler   Inhalation   Inhale 2 puffs into the lungs every 4 (four) hours as needed for wheezing or shortness of breath.          . ALPRAZolam (XANAX) 0.25 MG tablet   Oral   Take 1 tablet (0.25 mg total) by mouth 2 (two) times daily as needed for anxiety or sleep.   30 tablet   0   . amoxicillin-clavulanate (AUGMENTIN) 875-125 MG tablet   Oral   Take 1 tablet by mouth 2 (two) times daily.   14 tablet   0   . aspirin EC 81 MG tablet   Oral   Take 81 mg by mouth daily.         . carvedilol (COREG) 6.25 MG tablet   Oral   Take 6.25 mg by mouth 2 (two) times daily.          . Fluticasone-Salmeterol (ADVAIR) 100-50 MCG/DOSE AEPB   Inhalation   Inhale 1 puff into the lungs 2 (two) times daily.         . furosemide (LASIX) 40 MG tablet   Oral   Take 40 mg by mouth 3 (three) times daily.         . Multiple Vitamin (MULTIVITAMIN WITH MINERALS) TABS tablet   Oral   Take 1 tablet by mouth daily.         . nitroGLYCERIN (NITROSTAT) 0.4 MG SL tablet   Oral   Take 0.4 mg by mouth every 5 (five) minutes x 3 doses as needed for chest pain.          Marland Kitchen omeprazole (PRILOSEC) 20 MG capsule   Oral   Take 20 mg by mouth 2 (two) times daily.          Marland Kitchen oxyCODONE-acetaminophen (ROXICET) 5-325 MG tablet   Oral   Take 1 tablet by mouth every 6 (six) hours as needed.   30 tablet   0   . tiotropium (SPIRIVA HANDIHALER) 18 MCG inhalation capsule   Inhalation   Place 1 capsule (18 mcg total) into inhaler and inhale daily.   30 capsule   0     Allergies Lisinopril and Metoprolol  Family History  Problem Relation Age of Onset  . Heart attack Mother   . Stroke Mother   . Cancer Father   . COPD Father   . Heart attack Father     Social History Social History  Substance Use Topics  . Smoking status: Current Every Day Smoker -- 0.25 packs/day for 45 years    Types: Cigarettes  . Smokeless tobacco: Never Used  . Alcohol Use: No    Review of  Systems Constitutional: No fever/chills Eyes: No visual changes. ENT: No sore throat. No stiff neck no neck pain Cardiovascular: Denies chest pain. Respiratory: Denies shortness of breath. Gastrointestinal:   no vomiting.  No diarrhea.  No constipation. Genitourinary: Negative for dysuria. Musculoskeletal: see history of present illness Skin: Negative for rash. Neurological: Negative for headaches, focal weakness or numbness. 10-point ROS otherwise negative.  ____________________________________________   PHYSICAL EXAM:  VITAL SIGNS: ED Triage Vitals  Enc Vitals Group  BP 11/15/15 1544 153/69 mmHg     Pulse Rate 11/15/15 1544 103     Resp 11/15/15 1544 16     Temp 11/15/15 1544 97.9 F (36.6 C)     Temp Source 11/15/15 1544 Oral     SpO2 11/15/15 1544 95 %     Weight 11/15/15 1544 190 lb (86.183 kg)     Height 11/15/15 1544 6\' 2"  (1.88 m)     Head Cir --      Peak Flow --      Pain Score 11/15/15 1545 9     Pain Loc --      Pain Edu? --      Excl. in [place]? --     Constitutional: Alert and oriented. Well appearing and in no acute distress. Eyes: Conjunctivae are normal. PERRL. EOMI. Head: Atraumatic. Nose: No congestion/rhinnorhea. Mouth/Throat: Mucous membranes are moist.  Oropharynx non-erythematous. Neck: No stridor.   Nontender with no meningismus Cardiovascular: Normal rate, regular rhythm. Grossly normal heart sounds.  Good peripheral circulation. Respiratory: Normal respiratory effort.  No retractions. Lungs CTAB. Abdominal: Soft and nontender. No distention. No guarding no rebound your ostomy tube in place Back:  There is no focal tenderness or step off there is no midline tenderness there are no lesions noted. 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 The left foot is warm to touch, there is a faint dorsal pedal pulse which is verified by Doppler as  quite robust, there is mild erythema to the foot but no significant cellulitic changes. There is some chronic appearing skin changes and sloughing which do not appear to have happened recently. There is intact sensation. There is good cap refill, slightly delayed but still nonetheless less than 4 seconds, there is no crepitus noted, there is left greater than right edema isolated to the foot itself. The foot is warm and well-perfused to touch.. No joint effusions, no DVT signs the area of incision in the left inguinal region is clean dry warm and intact with no evidence of thrill bruit or aneurysm there is no evidence of saline changes and there is resolving hematoma present Neurologic:  Normal speech and language. No gross focal neurologic deficits are appreciated.  Skin:  Skin is warm, dry and intact. No rash noted. Psychiatric: Mood and affect are normal. Speech and behavior are normal.  ____________________________________________   LABS (all labs ordered are listed, but only abnormal results are displayed)  Labs Reviewed  CBC WITH DIFFERENTIAL/PLATELET - Abnormal; Notable for the following:    WBC 10.7 (*)    Hemoglobin 12.0 (*)    HCT 38.6 (*)    MCV 74.4 (*)    MCH 23.1 (*)    MCHC 31.0 (*)    RDW 22.4 (*)    Neutro Abs 8.8 (*)    Lymphs Abs 0.9 (*)    All other components within normal limits  BASIC METABOLIC PANEL - Abnormal; Notable for the following:    Glucose, Bld 153 (*)    BUN 39 (*)    Creatinine, Ser 1.91 (*)    Calcium 8.5 (*)    GFR calc non Af Amer 33 (*)    GFR calc Af Amer 38 (*)    All other components within normal limits  PROTIME-INR - Abnormal; Notable for the following:    Prothrombin Time 15.6 (*)    All other components within  normal limits   ____________________________________________  EKG  I personally interpreted any EKGs ordered by me or triage  ____________________________________________  RADIOLOGY  I reviewed any imaging ordered by me  or triage that were performed during my shift and, if possible, patient and/or family made aware of any abnormal findings. ____________________________________________   PROCEDURES  Procedure(s) performed: None  Critical Care performed: None  ____________________________________________   INITIAL IMPRESSION / ASSESSMENT AND PLAN / ED COURSE  Pertinent labs & imaging results that were available during my care of the patient were reviewed by me and considered in my medical decision making (see chart for details).  Patient here for pain in his foot after walking on it for the first time extensively in some time. He does not have an elevated white count she does not have any evidence of acute Decompromised vascular flow to that leg, there is no evidence of significant cellulitis although the area is somewhat erythematous it looks to be chronically so. Patient states he is mostly here for pain control. We gave him pain medication and he feels much better. I did talk to the on-call vascular surgeon. Dr. Delana Meyer asked me as a precaution to give him Rocephin and sent him to mom Augmentin, we'll comply with vascular surgery instructions for this patient. Again he has faint tactile DP and strongly dopplerable DP posterior tibial pulses at this time.  ____________________________________________   FINAL CLINICAL IMPRESSION(S) / ED DIAGNOSES  Final diagnoses:  Foot pain      This chart was dictated using voice recognition software.  Despite best efforts to proofread,  errors can occur which can change meaning.     Schuyler Amor, MD 11/15/15 1912  Schuyler Amor, MD 11/15/15 [phone]

## 2015-11-15 NOTE — ED Notes (Signed)

## 2015-11-15 NOTE — ED Notes (Signed)
Left foot.  Vascular stents placed last Wednesday to left leg.  Left foot changed color to blue last night. Patient states that left 5th toe has been blue for the past month.  Patient states that pain has worsened overnight.  Patient states he took a Hydrocodone 7.5 mg today at noon and pain "Untouched".

## 2015-11-15 NOTE — Discharge Instructions (Signed)
Do not overdo walking on your foot. If you have increased pain or swelling, increased redness, fever, streaks from the area, numbness or weakness, or you feel worse in any way please return to the emergency room. Follow closely with your orthopedic surgeon, your primary care doctor, and the vascular surgeon, Dr. dew tomorrow.

## 2015-11-29 ENCOUNTER — Encounter: Payer: Self-pay | Admitting: Emergency Medicine

## 2015-11-29 ENCOUNTER — Inpatient Hospital Stay
Admission: EM | Admit: 2015-11-29 | Discharge: 2015-12-01 | DRG: 291 | Disposition: A | Discharge status: Home / Self Care | Payer: Medicare Other | Attending: Internal Medicine | Admitting: Internal Medicine

## 2015-11-29 ENCOUNTER — Emergency Department: Payer: Medicare Other

## 2015-11-29 DIAGNOSIS — H3530 Unspecified macular degeneration: Secondary | ICD-10-CM | POA: Diagnosis present

## 2015-11-29 DIAGNOSIS — Z79891 Long term (current) use of opiate analgesic: Secondary | ICD-10-CM | POA: Diagnosis not present

## 2015-11-29 DIAGNOSIS — Z9981 Dependence on supplemental oxygen: Secondary | ICD-10-CM

## 2015-11-29 DIAGNOSIS — Z7982 Long term (current) use of aspirin: Secondary | ICD-10-CM | POA: Diagnosis not present

## 2015-11-29 DIAGNOSIS — Z23 Encounter for immunization: Secondary | ICD-10-CM | POA: Diagnosis not present

## 2015-11-29 DIAGNOSIS — E785 Hyperlipidemia, unspecified: Secondary | ICD-10-CM | POA: Diagnosis present

## 2015-11-29 DIAGNOSIS — F17210 Nicotine dependence, cigarettes, uncomplicated: Secondary | ICD-10-CM | POA: Diagnosis present

## 2015-11-29 DIAGNOSIS — R748 Abnormal levels of other serum enzymes: Secondary | ICD-10-CM | POA: Diagnosis present

## 2015-11-29 DIAGNOSIS — H35039 Hypertensive retinopathy, unspecified eye: Secondary | ICD-10-CM | POA: Diagnosis present

## 2015-11-29 DIAGNOSIS — Z888 Allergy status to other drugs, medicaments and biological substances status: Secondary | ICD-10-CM | POA: Diagnosis not present

## 2015-11-29 DIAGNOSIS — I130 Hypertensive heart and chronic kidney disease with heart failure and stage 1 through stage 4 chronic kidney disease, or unspecified chronic kidney disease: Principal | ICD-10-CM | POA: Diagnosis present

## 2015-11-29 DIAGNOSIS — N189 Chronic kidney disease, unspecified: Secondary | ICD-10-CM | POA: Diagnosis present

## 2015-11-29 DIAGNOSIS — J441 Chronic obstructive pulmonary disease with (acute) exacerbation: Secondary | ICD-10-CM | POA: Diagnosis present

## 2015-11-29 DIAGNOSIS — I110 Hypertensive heart disease with heart failure: Secondary | ICD-10-CM | POA: Diagnosis not present

## 2015-11-29 DIAGNOSIS — K219 Gastro-esophageal reflux disease without esophagitis: Secondary | ICD-10-CM | POA: Diagnosis present

## 2015-11-29 DIAGNOSIS — F329 Major depressive disorder, single episode, unspecified: Secondary | ICD-10-CM | POA: Diagnosis present

## 2015-11-29 DIAGNOSIS — J9621 Acute and chronic respiratory failure with hypoxia: Secondary | ICD-10-CM | POA: Diagnosis present

## 2015-11-29 DIAGNOSIS — I509 Heart failure, unspecified: Secondary | ICD-10-CM

## 2015-11-29 DIAGNOSIS — I739 Peripheral vascular disease, unspecified: Secondary | ICD-10-CM | POA: Diagnosis present

## 2015-11-29 DIAGNOSIS — I5023 Acute on chronic systolic (congestive) heart failure: Secondary | ICD-10-CM | POA: Diagnosis present

## 2015-11-29 DIAGNOSIS — J90 Pleural effusion, not elsewhere classified: Secondary | ICD-10-CM | POA: Diagnosis not present

## 2015-11-29 DIAGNOSIS — Z79899 Other long term (current) drug therapy: Secondary | ICD-10-CM

## 2015-11-29 DIAGNOSIS — Z720 Tobacco use: Secondary | ICD-10-CM | POA: Diagnosis present

## 2015-11-29 LAB — BRAIN NATRIURETIC PEPTIDE

## 2015-11-29 LAB — URINALYSIS COMPLETE WITH MICROSCOPIC (ARMC ONLY)
BILIRUBIN URINE: NEGATIVE
Glucose, UA: NEGATIVE mg/dL
Ketones, ur: NEGATIVE mg/dL
Nitrite: NEGATIVE
PH: 5 (ref 5.0–8.0)
PROTEIN: 100 mg/dL — AB
SQUAMOUS EPITHELIAL / LPF: NONE SEEN
Specific Gravity, Urine: 1.02 (ref 1.005–1.030)

## 2015-11-29 LAB — CBC
HCT: 39.8 % — ABNORMAL LOW (ref 40.0–52.0)
HEMOGLOBIN: 12 g/dL — AB (ref 13.0–18.0)
MCH: 22.3 pg — AB (ref 26.0–34.0)
MCHC: 30.2 g/dL — ABNORMAL LOW (ref 32.0–36.0)
MCV: 73.8 fL — AB (ref 80.0–100.0)
Platelets: 279 10*3/uL (ref 150–440)
RBC: 5.39 MIL/uL (ref 4.40–5.90)
RDW: 21.8 % — ABNORMAL HIGH (ref 11.5–14.5)
WBC: 11.7 10*3/uL — ABNORMAL HIGH (ref 3.8–10.6)

## 2015-11-29 LAB — COMPREHENSIVE METABOLIC PANEL
ALK PHOS: 169 U/L — AB (ref 38–126)
ALT: 18 U/L (ref 17–63)
AST: 22 U/L (ref 15–41)
Albumin: 3.2 g/dL — ABNORMAL LOW (ref 3.5–5.0)
Anion gap: 10 (ref 5–15)
BILIRUBIN TOTAL: 0.7 mg/dL (ref 0.3–1.2)
BUN: 34 mg/dL — ABNORMAL HIGH (ref 6–20)
CALCIUM: 8.5 mg/dL — AB (ref 8.9–10.3)
CO2: 27 mmol/L (ref 22–32)
CREATININE: 2.11 mg/dL — AB (ref 0.61–1.24)
Chloride: 98 mmol/L — ABNORMAL LOW (ref 101–111)
GFR, EST AFRICAN AMERICAN: 34 mL/min — AB (ref >60)
GFR, EST NON AFRICAN AMERICAN: 29 mL/min — AB (ref >60)
Glucose, Bld: 111 mg/dL — ABNORMAL HIGH (ref 65–99)
Potassium: 4.2 mmol/L (ref 3.5–5.1)
Sodium: 135 mmol/L (ref 135–145)
TOTAL PROTEIN: 6.6 g/dL (ref 6.5–8.1)

## 2015-11-29 LAB — CBC WITH DIFFERENTIAL/PLATELET
BASOS ABS: 0.1 10*3/uL (ref 0–0.1)
EOS ABS: 0.2 10*3/uL (ref 0–0.7)
Eosinophils Relative: 2 %
HCT: 43.2 % (ref 40.0–52.0)
HEMOGLOBIN: 12.9 g/dL — AB (ref 13.0–18.0)
Lymphocytes Relative: 13 %
Lymphs Abs: 1.6 10*3/uL (ref 1.0–3.6)
MCH: 22.1 pg — ABNORMAL LOW (ref 26.0–34.0)
MCHC: 29.9 g/dL — AB (ref 32.0–36.0)
MCV: 74 fL — ABNORMAL LOW (ref 80.0–100.0)
Monocytes Absolute: 0.9 10*3/uL (ref 0.2–1.0)
NEUTROS ABS: 9.6 10*3/uL — AB (ref 1.4–6.5)
Platelets: 302 10*3/uL (ref 150–440)
RBC: 5.84 MIL/uL (ref 4.40–5.90)
RDW: 21.9 % — ABNORMAL HIGH (ref 11.5–14.5)
WBC: 12.4 10*3/uL — AB (ref 3.8–10.6)

## 2015-11-29 LAB — CREATININE, SERUM
CREATININE: 2.04 mg/dL — AB (ref 0.61–1.24)
GFR calc non Af Amer: 31 mL/min — ABNORMAL LOW (ref >60)
GFR, EST AFRICAN AMERICAN: 36 mL/min — AB (ref >60)

## 2015-11-29 LAB — TROPONIN I
TROPONIN I: 0.05 ng/mL — AB (ref <0.031)
Troponin I: 0.03 ng/mL (ref <0.031)

## 2015-11-29 LAB — TSH: TSH: 10 u[IU]/mL — ABNORMAL HIGH (ref 0.350–4.500)

## 2015-11-29 MED ORDER — FUROSEMIDE 10 MG/ML IJ SOLN: 40.0000 mg | Freq: Two times a day (BID) | INTRAMUSCULAR | Status: DC

## 2015-11-29 MED ORDER — CARVEDILOL 6.25 MG PO TABS
6.2500 mg | ORAL_TABLET | Freq: Two times a day (BID) | ORAL | Status: DC
  Administered 2015-11-29 – 2015-12-01 (×4): 6.25 mg via ORAL
  Filled 2015-11-29 (×4): qty 1

## 2015-11-29 MED ORDER — FLUTICASONE FUROATE-VILANTEROL 100-25 MCG/INH IN AEPB
1.0000 | INHALATION_SPRAY | Freq: Every day | RESPIRATORY_TRACT | Status: DC
  Administered 2015-11-30 – 2015-12-01 (×2): 1 via RESPIRATORY_TRACT
  Filled 2015-11-29: qty 28

## 2015-11-29 MED ORDER — PNEUMOCOCCAL VAC POLYVALENT 25 MCG/0.5ML IJ INJ
0.5000 mL | INJECTION | INTRAMUSCULAR | Status: AC
  Administered 2015-11-30: 0.5 mL via INTRAMUSCULAR
  Filled 2015-11-29: qty 0.5

## 2015-11-29 MED ORDER — NITROGLYCERIN 0.4 MG SL SUBL: 0.4000 mg | SUBLINGUAL_TABLET | SUBLINGUAL | Status: DC | PRN

## 2015-11-29 MED ORDER — MORPHINE SULFATE (PF) 4 MG/ML IV SOLN
4.0000 mg | Freq: Once | INTRAVENOUS | Status: AC
  Administered 2015-11-29: 4 mg via INTRAVENOUS
  Filled 2015-11-29: qty 1

## 2015-11-29 MED ORDER — HEPARIN SODIUM (PORCINE) 5000 UNIT/ML IJ SOLN
5000.0000 [IU] | Freq: Three times a day (TID) | INTRAMUSCULAR | Status: DC
  Administered 2015-11-29 – 2015-12-01 (×5): 5000 [IU] via SUBCUTANEOUS
  Filled 2015-11-29 (×5): qty 1

## 2015-11-29 MED ORDER — ACETAMINOPHEN 500 MG PO TABS: 1000.0000 mg | ORAL_TABLET | Freq: Four times a day (QID) | ORAL | Status: DC | PRN

## 2015-11-29 MED ORDER — FUROSEMIDE 10 MG/ML IJ SOLN
80.0000 mg | Freq: Two times a day (BID) | INTRAMUSCULAR | Status: DC
  Administered 2015-11-30 – 2015-12-01 (×3): 80 mg via INTRAVENOUS
  Filled 2015-11-29 (×3): qty 8

## 2015-11-29 MED ORDER — LEVOFLOXACIN 500 MG PO TABS
500.0000 mg | ORAL_TABLET | Freq: Every day | ORAL | Status: DC
  Administered 2015-11-29: 500 mg via ORAL
  Filled 2015-11-29: qty 1

## 2015-11-29 MED ORDER — FUROSEMIDE 10 MG/ML IJ SOLN
80.0000 mg | Freq: Once | INTRAMUSCULAR | Status: AC
  Administered 2015-11-29: 80 mg via INTRAVENOUS
  Filled 2015-11-29: qty 8

## 2015-11-29 MED ORDER — ALPRAZOLAM 0.25 MG PO TABS
0.2500 mg | ORAL_TABLET | Freq: Two times a day (BID) | ORAL | Status: DC | PRN
  Administered 2015-11-30 (×2): 0.25 mg via ORAL
  Filled 2015-11-29 (×2): qty 1

## 2015-11-29 MED ORDER — IPRATROPIUM-ALBUTEROL 0.5-2.5 (3) MG/3ML IN SOLN
3.0000 mL | RESPIRATORY_TRACT | Status: DC
  Administered 2015-11-29 – 2015-12-01 (×11): 3 mL via RESPIRATORY_TRACT
  Filled 2015-11-29 (×11): qty 3

## 2015-11-29 MED ORDER — GUAIFENESIN 100 MG/5ML PO SOLN
5.0000 mL | ORAL | Status: DC | PRN
  Administered 2015-11-29 – 2015-11-30 (×3): 100 mg via ORAL
  Filled 2015-11-29 (×3): qty 10

## 2015-11-29 MED ORDER — CETYLPYRIDINIUM CHLORIDE 0.05 % MT LIQD
7.0000 mL | Freq: Two times a day (BID) | OROMUCOSAL | Status: DC
  Administered 2015-11-30 – 2015-12-01 (×4): 7 mL via OROMUCOSAL

## 2015-11-29 MED ORDER — ASPIRIN EC 81 MG PO TBEC
81.0000 mg | DELAYED_RELEASE_TABLET | Freq: Every day | ORAL | Status: DC
  Administered 2015-11-30 – 2015-12-01 (×2): 81 mg via ORAL
  Filled 2015-11-29 (×2): qty 1

## 2015-11-29 MED ORDER — METHYLPREDNISOLONE SODIUM SUCC 125 MG IJ SOLR
60.0000 mg | Freq: Four times a day (QID) | INTRAMUSCULAR | Status: DC
  Administered 2015-11-29 – 2015-11-30 (×3): 60 mg via INTRAVENOUS
  Filled 2015-11-29 (×3): qty 2

## 2015-11-29 MED ORDER — HYDROCODONE-ACETAMINOPHEN 7.5-325 MG PO TABS
1.0000 | ORAL_TABLET | Freq: Four times a day (QID) | ORAL | Status: DC | PRN
  Administered 2015-11-29: 1 via ORAL
  Filled 2015-11-29: qty 1

## 2015-11-29 MED ORDER — ALBUTEROL SULFATE (2.5 MG/3ML) 0.083% IN NEBU: 3.0000 mL | INHALATION_SOLUTION | RESPIRATORY_TRACT | Status: DC | PRN

## 2015-11-29 MED ORDER — PANTOPRAZOLE SODIUM 40 MG PO TBEC
40.0000 mg | DELAYED_RELEASE_TABLET | Freq: Every day | ORAL | Status: DC
  Administered 2015-11-30 – 2015-12-01 (×2): 40 mg via ORAL
  Filled 2015-11-29 (×2): qty 1

## 2015-11-29 NOTE — ED Notes (Signed)
Pt transported to xray 

## 2015-11-29 NOTE — ED Notes (Signed)
Admitting MD at bedside.

## 2015-11-29 NOTE — H&P (Signed)
[facility] NAME: Collin [name]    MR#:  [id number]  DATE OF BIRTH:  [birth year]  DATE OF ADMISSION:  11/29/2015  PRIMARY CARE PHYSICIAN: [name], MD   REQUESTING/REFERRING PHYSICIAN: Dr Kerman Passey  CHIEF COMPLAINT:   Chief Complaint  Patient presents with  . Shortness of Breath    HISTORY OF PRESENT ILLNESS: Collin [name]  is a 75 y.o. male with a known history of 75 year old Caucasian male with a past medical history significant for CHF with EF of 26%, COPD, need for home oxygen, previous history of kidney stone, chronic kidney disease, hypertension, hyperlipidemia and peripheral vascular disease. He presents to ED today due to a two-day history of worsening shortness of breath. Patient developed shortness of breath about 2 days ago and has gradually worsened over the last 2 days. Shortness of breath is associated with exertion but also occurs at rest. There is significant orthopnea as well as PND and patient has needed to sleeping in the upright position. He reports significant wheezing and has had 2 increase his oxygen use from a baseline of 2 L/m to 4 L/m without any improvement. Patient also complains of leg swelling for last few days. He denies chest pain but has nausea without vomiting. He denies palpitation, dizziness, syncope or confusion. He states that he is chronic pleural tube has been draining increasingly in the last 3 days. He denies any travel or contact with sick persons. he also denies any fever. He states that his abdomen feels distended. He continues to smoke 3-5 cigarettes per day.  On arrival in the ED, patient was noted to have normal vital 6 of a respiratory rate of 33. White count was elevated at 12.4. Troponin was also elevated at 0.05. CMP was only significant for a creatinine of 2.11 which appears to be close to his baseline. BNP was 4500 which is consistent with previous values and chest x-ray showed  cardiac megaly with pulmonary vascular congestion as well as pulmonary edema. 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 Patient was given 80 mg of Lasix in the ED and was also given morphine. He'll be admitted due to his acute on chronic respiratory failure with hypoxia secondary to CHF/COPD exacerbation.  He wishes to be full code.   PAST MEDICAL HISTORY:   Past Medical History  Diagnosis Date  . COPD (chronic obstructive pulmonary disease) ([place])   . Chronic systolic CHF (congestive heart failure) ([place])   . Kidney stone   . Macular degeneration   . HLD (hyperlipidemia)   . Hypertensive retinopathy   . HTN (hypertension)   . GERD (gastroesophageal reflux disease)   . Peripheral vascular disease of lower extremity ([place])     PAST SURGICAL HISTORY: Past Surgical History  Procedure Laterality Date  . Abdominal aortic aneurysm repair    . Hernia repair    . Kidney stone surgery    . Chest tube insertion Right 07/07/2015    Procedure: INSERTION PLEURAL DRAINAGE CATHETER;  Surgeon: Nestor Lewandowsky, MD;  Location: [facility];  Service: Thoracic;  Laterality: Right;  . Peripheral vascular catheterization N/A 11/03/2015    Procedure: Abdominal Aortogram w/Lower Extremity;  Surgeon: Algernon Huxley, MD;  Location: [facility];  Service: Cardiovascular;  Laterality: N/A;  . Peripheral vascular catheterization  11/03/2015    Procedure: Lower Extremity Intervention;  Surgeon: Algernon Huxley, MD;  Location: [facility];  Service: Cardiovascular;;  . Peripheral vascular catheterization Left 11/10/2015    Procedure: Lower Extremity Angiography;  Surgeon: Algernon Huxley, MD;  Location: [facility];  Service: Cardiovascular;  Laterality: Left;  . Peripheral vascular catheterization  11/10/2015    Procedure: Lower Extremity Intervention;  Surgeon: Algernon Huxley, MD;  Location: [facility];  Service: Cardiovascular;;  . Peripheral vascular  stent Left     SOCIAL HISTORY:  Social History  Substance Use Topics  . Smoking status: Current Every Day Smoker -- 0.25 packs/day for 45 years    Types: Cigarettes  . Smokeless tobacco: Never Used  . Alcohol Use: No    FAMILY HISTORY:  Family History  Problem Relation Age of Onset  . Heart attack Mother   . Stroke Mother   . Cancer Father   . COPD Father   . Heart attack Father     DRUG ALLERGIES:  Allergies  Allergen Reactions  . Lisinopril Anaphylaxis  . Metoprolol Other (See Comments)    Reaction:  Ringing in ears     REVIEW OF SYSTEMS:   CONSTITUTIONAL: No fever EYES: No blurred or double vision.  EARS, NOSE, AND THROAT: No tinnitus or ear pain.  RESPIRATORY: As in history of present illness CARDIOVASCULAR: As in history of present illness GASTROINTESTINAL: No vomiting, diarrhea or abdominal pain.  GENITOURINARY: No dysuria, hematuria.  ENDOCRINE: No polyuria, nocturia,  HEMATOLOGY: No anemia, easy bruising or bleeding SKIN: No rash or lesion. MUSCULOSKELETAL: No joint pain or arthritis.   NEUROLOGIC: No tingling, numbness, weakness.  PSYCHIATRY: No anxiety or depression.   MEDICATIONS AT HOME:  Prior to Admission medications   Medication Sig Start Date End Date Taking? Authorizing Provider  acetaminophen (TYLENOL) 500 MG tablet Take 1,000 mg by mouth every 6 (six) hours as needed for mild pain.   Yes Historical Provider, MD  albuterol (PROVENTIL HFA;VENTOLIN HFA) 108 (90 BASE) MCG/ACT inhaler Inhale 2 puffs into the lungs every 4 (four) hours as needed for wheezing or shortness of breath.    Yes Historical Provider, MD  ALPRAZolam (XANAX) 0.25 MG tablet Take 1 tablet (0.25 mg total) by mouth 2 (two) times daily as needed for anxiety or sleep. 10/29/15  Yes Srikar Sudini, MD  aspirin EC 81 MG tablet Take 81 mg by mouth daily.   Yes Historical Provider, MD  carvedilol (COREG) 6.25 MG tablet Take 6.25 mg by mouth 2 (two) times daily.    Yes Historical Provider,  MD  Fluticasone-Salmeterol (ADVAIR) 100-50 MCG/DOSE AEPB Inhale 1 puff into the lungs 2 (two) times daily.   Yes Historical Provider, MD  furosemide (LASIX) 40 MG tablet Take 40 mg by mouth 3 (three) times daily.   Yes Historical Provider, MD  HYDROcodone-acetaminophen (NORCO) 7.5-325 MG tablet Take 1 tablet by mouth every 6 (six) hours as needed for moderate pain.   Yes Historical Provider, MD  Multiple Vitamin (MULTIVITAMIN WITH MINERALS) TABS tablet Take 1 tablet by mouth daily.   Yes Historical Provider, MD  nitroGLYCERIN (NITROSTAT) 0.4 MG SL tablet Take 0.4 mg by mouth every 5 (five) minutes x 3 doses as needed for chest pain.    Yes Historical Provider, MD  omeprazole (PRILOSEC) 20 MG capsule Take 20 mg by mouth 2 (two) times daily.    Yes Historical Provider, MD  tiotropium (SPIRIVA HANDIHALER) 18 MCG inhalation capsule Place 1 capsule (18 mcg total) into inhaler and inhale daily. 03/06/15  Yes Hillary Bow, MD  oxyCODONE-acetaminophen (ROXICET) 5-325 MG tablet  Take 1 tablet by mouth every 6 (six) hours as needed. Patient not taking: Reported on 11/29/2015 10/29/15   Hillary Bow, MD      PHYSICAL EXAMINATION:   VITAL SIGNS: Blood pressure 142/88, pulse 100, temperature 97.5 F (36.4 C), temperature source Oral, resp. rate 24, height 6\' 2"  (1.88 m), weight 83.008 kg (183 lb), SpO2 100 %. GENERAL:  75 y.o.-year-old patient lying in the bed. Alert and oriented x 3. In moderate respiratory distress and speaking in short sentences. EYES: Pupils equal, round, reactive to light and accommodation. No scleral icterus. Extraocular muscles intact.  HEENT: Head atraumatic, normocephalic. Oropharynx and nasopharynx clear.  NECK:  Supple, no jugular venous distention. No thyroid enlargement, no tenderness.  LUNGS: There are generalized crackles present as well as prolonged expiratory phase of respiration with [place] wheezes present in all lung zones.  CARDIOVASCULAR: Tachycardia, S1, S2 normal. No  murmurs, rubs, or gallops.  ABDOMEN: Soft, nontender, nondistended. Bowel sounds present. No organomegaly or mass.  EXTREMITIES: 1+ pedal edema up to knees bilaterally. The right fifth toe appears gangrenous but this is chronic. NEUROLOGIC: Cranial nerves II through XII are intact. Muscle strength 5/5 in all extremities. Sensation intact. Gait not checked.   SKIN: No obvious rash, lesion, or ulcer.   LABORATORY PANEL:   CBC  Recent Labs Lab 11/29/15 1740  WBC 12.4*  HGB 12.9*  HCT 43.2  PLT 302  MCV 74.0*  MCH 22.1*  MCHC 29.9*  RDW 21.9*  LYMPHSABS 1.6  MONOABS 0.9  EOSABS 0.2  BASOSABS 0.1   ------------------------------------------------------------------------------------------------------------------  Chemistries   Recent Labs Lab 11/29/15 1740  NA 135  K 4.2  CL 98*  CO2 27  GLUCOSE 111*  BUN 34*  CREATININE 2.11*  CALCIUM 8.5*  AST 22  ALT 18  ALKPHOS 169*  BILITOT 0.7   ------------------------------------------------------------------------------------------------------------------ estimated creatinine clearance is 36.3 mL/min (by C-G formula based on Cr of 2.11). ------------------------------------------------------------------------------------------------------------------ No results for input(s): TSH, T4TOTAL, T3FREE, THYROIDAB in the last 72 hours.  Invalid input(s): FREET3   Coagulation profile No results for input(s): INR, PROTIME in the last 168 hours. ------------------------------------------------------------------------------------------------------------------- No results for input(s): DDIMER in the last 72 hours. -------------------------------------------------------------------------------------------------------------------  Cardiac Enzymes  Recent Labs Lab 11/29/15 1740  TROPONINI 0.05*   ------------------------------------------------------------------------------------------------------------------ Invalid input(s):  POCBNP  ---------------------------------------------------------------------------------------------------------------  Urinalysis    Component Value Date/Time   COLORURINE YELLOW* 10/01/2015 1558   COLORURINE Yellow 08/15/2014 0745   APPEARANCEUR CLOUDY* 10/01/2015 1558   APPEARANCEUR Clear 08/15/2014 0745   LABSPEC 1.018 10/01/2015 1558   LABSPEC 1.015 08/15/2014 0745   PHURINE 6.0 10/01/2015 1558   PHURINE 6.0 08/15/2014 0745   GLUCOSEU NEGATIVE 10/01/2015 1558   GLUCOSEU 50 mg/dL 08/15/2014 0745   HGBUR 2+* 10/01/2015 1558   HGBUR 2+ 08/15/2014 0745   BILIRUBINUR NEGATIVE 10/01/2015 1558   BILIRUBINUR Negative 08/15/2014 0745   KETONESUR NEGATIVE 10/01/2015 1558   KETONESUR Negative 08/15/2014 0745   PROTEINUR 100* 10/01/2015 1558   PROTEINUR 100 mg/dL 08/15/2014 0745   NITRITE NEGATIVE 10/01/2015 1558   NITRITE Negative 08/15/2014 0745   LEUKOCYTESUR 3+* 10/01/2015 1558   LEUKOCYTESUR Trace 08/15/2014 0745     RADIOLOGY: Dg Chest 2 View  11/29/2015  CLINICAL DATA:  Pt having SOB for 3 days. Hx of pneumonia, bronchitis, asthma, COPD, hypertension, CHF. He has had AAA repair. Current everyday smoker. EXAM: CHEST  2 VIEW COMPARISON:  10/26/2015 FINDINGS: Cardiac enlargement with pulmonary vascular congestion and mild interstitial edema. Small right pleural effusion with  probable loculation laterally. There appears to be a drain in place. Small left pleural effusion. Fluid in the fissures. Appearances are similar to previous study. Azygos lobe. Calcified and tortuous aorta. No pneumothorax. IMPRESSION: Cardiac enlargement with pulmonary vascular congestion and interstitial edema. Bilateral pleural effusions, greater on the right, with loculated fluid in the right pleural space laterally. Drainage catheter is in place. Appearance is similar previous study. Electronically Signed   By: Lucienne Capers M.D.   On: 11/29/2015 18:25    EKG: Orders placed or performed during the  hospital encounter of 11/29/15  . ED EKG  . ED EKG  . EKG 12-Lead  . EKG 12-Lead    ASSESSMENT  Principal Problem:   Acute exacerbation of CHF (congestive heart failure) (HCC) Active Problems:   Recurrent right pleural effusion   Acute on chronic respiratory failure with hypoxia (H[facility])   COPD exacerbation ([facility])   Elevated troponin   Tobacco use  PLAN   1). Acute CHF exacerbation with elevated troponin - Patient has a chronic history of congestive heart failure with systolic depression. EF is rated at about 26%. - He presents with shortness of breath associated with orthopnea, pedal edema and PND. This is consistent with CHF exacerbation. BNP is greater than 4500. - He received 80 mg of Lasix in the ED IV. We'll continue 80 mg every 12 hours. Continue carvedilol - Troponin is mildly elevated at 0.05. We'll obtain 2 more sets to rule out ACS. Check TSH as well. - Measure daily weights, strict intake and output as well as fluid restriction. - Continue oxygen supplementation and DuoNeb's.  2). COPD exacerbation - Patient has been wheezing and coughing. Cough is productive of mildly yellowish sputum. I suspect the patient has a component of COPD exacerbation as well. - Continue supplementary oxygen due to increased oxygen requirements. - Continue duo nebs and start Lovenox and. - IV steroids started. - Guaifenesin for cough. - Hold tiotropium where patient is on DuoNeb  3). Acute or chronic respiratory failure with acute - This is lead to secondary to acute CHF exacerbation and COPD exacerbation. Continue management as stated above.  4). Recurrent pleural effusion - This is chronic and will continue to drain chest tube.  5). Tobacco abuse - Patient smokes about 3-5 cigarettes per day. I have extensively counseled him on the need to quit smoking. Counseling lasted for about 3-4 minutes.  All the records are reviewed and case discussed with ED provider. Management plans discussed  with the patient, family and they are in agreement.  CODE STATUS:    Code Status Orders        Start     Ordered   11/29/15 2028  Full code   Continuous     11/29/15 2027    Code Status History    Date Active Date Inactive Code Status Order ID Comments User Context   10/26/2015 10:08 PM 10/29/2015  5:25 PM Full Code [id number]  Samson Frederic, DO Inpatient   08/22/2015  5:44 PM 08/31/2015  4:39 PM Full Code [id number]  Theodoro Grist, MD Inpatient   07/07/2015  1:57 PM 07/08/2015  2:28 PM Full Code [id number]  Nestor Lewandowsky, MD Inpatient   06/12/2015  3:54 PM 06/14/2015  5:01 PM Full Code [id number]  Baxter Hire, MD Inpatient   03/20/2015 12:20 AM 03/21/2015  9:25 PM Full Code [id number]  Lance Coon, MD ED   03/04/2015  5:52 AM 03/06/2015  2:26 PM Full Code [id number]  Lance Coon,  MD Inpatient      I have independently reviewed all EKG and chest x-ray data  VTE prophylaxis: Heparin if no contraindications and patient at low risk for bleeding. SCD's and progressive ambulation if patient has contraindications to anticoagulants. No DVT prophylaxis if patient presently receiving therapeutic anticoagulation or is at significant risk of bleeding for which the risk of anticoagulation outweigh the potential benefits.  Vaccinations: Pneumonia & flu vaccine per hospital protocol Prevention: Will proceed with conservative measures for the prevention of delirium in patients older than 65. Fall precautions and 1:1 sitter as needed per hospital protocol.  TOTAL TIME TAKING CARE OF THIS PATIENT: 20minutes.    Sylvan Cheese M.D on 11/29/2015 at 8:30 PM  Between 7am to 6pm - Pager - [phone]  After 6pm go to [email or web address] - password [email or web address] [facility]  Office  [phone]  CC: Primary care physician; [name], MD

## 2015-11-29 NOTE — ED Notes (Signed)
Attempted to call report. Receiving nurse in isolation room at this time.

## 2015-11-29 NOTE — ED Provider Notes (Signed)
[facility] Emergency Department Provider Note  Time seen: 5:53 PM  I have reviewed the triage vital signs and the nursing notes.   HISTORY  Chief Complaint Shortness of Breath    HPI Collin [name] is a 75 y.o. male with a past medical history of COPD, CHF, kidney stones status post right nephrostomy tube, hypertension, gastric reflux, history of pleural effusion with a right thoracentesis drain, presents the emergency department with shortness of breath abdominal swelling. According to the patient for the past one week he has had increasing shortness of breath as well as increasing lower extremity abdominal swelling. Patient increased his Lasix today, but states continued to feel very short of breath so came to the emergency department for evaluation. Denies any chest pain more than normal. Denies any abdominal pain more than normal although he does note it feels distended. Denies fever. Occasional cough but denies any acute worsening of cough or congestion.     Past Medical History  Diagnosis Date  . COPD (chronic obstructive pulmonary disease) ([place])   . Chronic systolic CHF (congestive heart failure) ([place])   . Kidney stone   . Macular degeneration   . HLD (hyperlipidemia)   . Hypertensive retinopathy   . HTN (hypertension)   . GERD (gastroesophageal reflux disease)   . Peripheral vascular disease of lower extremity [facility])     Patient Active Problem List   Diagnosis Date Noted  . Pneumonia 10/26/2015  . Sepsis ([place]) 10/26/2015  . Chronic respiratory failure with hypoxia ([place]) 10/26/2015  . Cellulitis of left foot 10/26/2015  . Gangrenous toe ([place]) 10/26/2015  . ARF (acute renal failure) ([place])   . Hydronephrosis   . UTI (lower urinary tract infection)   . Hydronephrosis with urinary obstruction due to renal calculus   . Acute congestive heart failure ([place]) 08/22/2015  . 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 Acute on chronic respiratory  failure ([place]) 06/12/2015  . Acute on chronic systolic CHF (congestive heart failure) ([place]) 03/19/2015  . Open wound of left heel 03/19/2015  . Recurrent right pleural effusion 03/04/2015  . Acute respiratory failure with hypoxemia ([place]) 03/04/2015  . COPD (chronic obstructive pulmonary disease) ([place]) 03/04/2015  . HLD (hyperlipidemia) 03/04/2015  . Chronic systolic CHF (congestive heart failure) ([place]) 03/04/2015  . HTN (hypertension) 03/04/2015  . GERD (gastroesophageal reflux disease) 03/04/2015    Past Surgical History  Procedure Laterality Date  . Abdominal aortic aneurysm repair    . Hernia repair    . Kidney stone surgery    . Chest tube insertion Right 07/07/2015    Procedure: INSERTION PLEURAL DRAINAGE CATHETER;  Surgeon: Nestor Lewandowsky, MD;  Location: [facility] ORS;  Service: Thoracic;  Laterality: Right;  . Peripheral vascular catheterization N/A 11/03/2015    Procedure: Abdominal Aortogram w/Lower Extremity;  Surgeon: Algernon Huxley, MD;  Location: [place] CV LAB;  Service: Cardiovascular;  Laterality: N/A;  . Peripheral vascular catheterization  11/03/2015    Procedure: Lower Extremity Intervention;  Surgeon: Algernon Huxley, MD;  Location: [place] CV LAB;  Service: Cardiovascular;;  . Peripheral vascular catheterization Left 11/10/2015    Procedure: Lower Extremity Angiography;  Surgeon: Algernon Huxley, MD;  Location: [place] CV LAB;  Service: Cardiovascular;  Laterality: Left;  . Peripheral vascular catheterization  11/10/2015    Procedure: Lower Extremity Intervention;  Surgeon: Algernon Huxley, MD;  Location: [place] CV LAB;  Service: Cardiovascular;;  . Peripheral vascular stent Left  Current Outpatient Rx  Name  Route  Sig  Dispense  Refill  . albuterol (PROVENTIL HFA;VENTOLIN HFA) 108 (90 BASE) MCG/ACT inhaler   Inhalation   Inhale 2 puffs into the lungs every 4 (four) hours as needed for wheezing or shortness of breath.          . ALPRAZolam (XANAX) 0.25 MG  tablet   Oral   Take 1 tablet (0.25 mg total) by mouth 2 (two) times daily as needed for anxiety or sleep.   30 tablet   0   . aspirin EC 81 MG tablet   Oral   Take 81 mg by mouth daily.         . carvedilol (COREG) 6.25 MG tablet   Oral   Take 6.25 mg by mouth 2 (two) times daily.          . Fluticasone-Salmeterol (ADVAIR) 100-50 MCG/DOSE AEPB   Inhalation   Inhale 1 puff into the lungs 2 (two) times daily.         . furosemide (LASIX) 40 MG tablet   Oral   Take 40 mg by mouth 3 (three) times daily.         . Multiple Vitamin (MULTIVITAMIN WITH MINERALS) TABS tablet   Oral   Take 1 tablet by mouth daily.         . nitroGLYCERIN (NITROSTAT) 0.4 MG SL tablet   Oral   Take 0.4 mg by mouth every 5 (five) minutes x 3 doses as needed for chest pain.          Marland Kitchen omeprazole (PRILOSEC) 20 MG capsule   Oral   Take 20 mg by mouth 2 (two) times daily.          Marland Kitchen oxyCODONE-acetaminophen (ROXICET) 5-325 MG tablet   Oral   Take 1 tablet by mouth every 6 (six) hours as needed.   30 tablet   0   . tiotropium (SPIRIVA HANDIHALER) 18 MCG inhalation capsule   Inhalation   Place 1 capsule (18 mcg total) into inhaler and inhale daily.   30 capsule   0     Allergies Lisinopril and Metoprolol  Family History  Problem Relation Age of Onset  . Heart attack Mother   . Stroke Mother   . Cancer Father   . COPD Father   . Heart attack Father     Social History Social History  Substance Use Topics  . Smoking status: Current Every Day Smoker -- 0.25 packs/day for 45 years    Types: Cigarettes  . Smokeless tobacco: Never Used  . Alcohol Use: No    Review of Systems Constitutional: Negative for fever. Cardiovascular: Negative for chest pain. Respiratory: Significant shortness of breath worse of the past 1 week. Gastrointestinal: Positive for abdominal distention and swelling. Negative for nausea, vomiting, diarrhea Genitourinary: Negative for dysuria. Decreased  urine output. Musculoskeletal: Negative for back pain Neurological: Negative for headache 10-point ROS otherwise negative.  ____________________________________________   PHYSICAL EXAM:  VITAL SIGNS: ED Triage Vitals  Enc Vitals Group     BP 11/29/15 1721 132/97 mmHg     Pulse Rate 11/29/15 1721 99     Resp 11/29/15 1721 24     Temp 11/29/15 1721 97.5 F (36.4 C)     Temp Source 11/29/15 1721 Oral     SpO2 11/29/15 1721 99 %     Weight 11/29/15 1721 183 lb (83.008 kg)     Height 11/29/15 1721 6\' 2"  (1.88 m)  Head Cir --      Peak Flow --      Pain Score 11/29/15 1722 9     Pain Loc --      Pain Edu? --      Excl. in [place]? --     Constitutional: Alert and oriented. Well appearing and in no distress. Eyes: Normal exam ENT   Head: Normocephalic and atraumatic   Mouth/Throat: Mucous membranes are moist. Cardiovascular: Normal rate, regular rhythm. No murmur Respiratory: Mild tachypnea around 25 breaths per minute. Decreased breath sounds in all lung fields. No obvious wheeze, rales, rhonchi. Gastrointestinal: Soft, mild distention. Largely nontender. No rebound or guarding. Dull percussion. Musculoskeletal: Mild lower extremity edema bilaterally. Patient does have a necrotic left fifth toe, which is known and being monitored. Neurologic:  Normal speech and language. No gross focal neurologic deficits  Skin:  Skin is warm, dry and intact.  Psychiatric: Mood and affect are normal. Speech and behavior are normal.   ____________________________________________    EKG  EKG reviewed and interpreted by myself shows sinus tachycardia 104 bpm. Narrow QRS, normal axis, low EKG amplitude/voltage, nonspecific ST changes. No ST elevations.  ____________________________________________    RADIOLOGY  Bilateral pleural effusions right greater than left with loculation on the right. Vascular congestion with interstitial  edema.  ____________________________________________   INITIAL IMPRESSION / ASSESSMENT AND PLAN / ED COURSE  Pertinent labs & imaging results that were available during my care of the patient were reviewed by me and considered in my medical decision making (see chart for details).  Patient presents the emergency department for worsening dyspnea, and lower extremity as well as abdominal edema/swelling. Patient has a history of a chronic right pleural effusion for which she has a thoracentesis drain. Patient also has a right nephrostomy tube due to bilateral kidney stones. Patient is scheduled for stone removal 12/03/15 by Dr. Hollice Espy. Patient does have decreased breath sounds in all lung fields but is currently satting 99% on 2 L oxygen which is his baseline. We will check labs, chest x-ray.  Labs are largely at the patient's baseline with a slight decrease in kidney function. Patient's chest x-ray largely stable from last month. Patient is white. He states the patient had a very rough night last night due to the shortness of breath, could not lie down, began coughing, hyperventilating almost passed out several times per wife. Given the increased shortness of breath with signs of pulmonary edema on chest x-ray we will begin IV diuresis and admit the patient to the hospital.  ____________________________________________   FINAL CLINICAL IMPRESSION(S) / ED DIAGNOSES  Dyspnea CHF exacerbation Pulmonary edema  Harvest Dark, MD 11/29/15 [phone]

## 2015-11-29 NOTE — ED Notes (Signed)
Pt given popsicle.

## 2015-11-29 NOTE — ED Notes (Signed)
Pt given sandwich tray and orange juice. 

## 2015-11-29 NOTE — ED Notes (Signed)
Increasingly SOB since yesterday. Increased abdominal edema since yesterday. Increased Lasix without relief. Patient has tube to drain R lung and nephrostomy on R.

## 2015-11-30 DIAGNOSIS — I130 Hypertensive heart and chronic kidney disease with heart failure and stage 1 through stage 4 chronic kidney disease, or unspecified chronic kidney disease: Secondary | ICD-10-CM | POA: Diagnosis not present

## 2015-11-30 LAB — BASIC METABOLIC PANEL
ANION GAP: 8 (ref 5–15)
BUN: 39 mg/dL — AB (ref 6–20)
CHLORIDE: 101 mmol/L (ref 101–111)
CO2: 23 mmol/L (ref 22–32)
Calcium: 8 mg/dL — ABNORMAL LOW (ref 8.9–10.3)
Creatinine, Ser: 2.01 mg/dL — ABNORMAL HIGH (ref 0.61–1.24)
GFR calc Af Amer: 36 mL/min — ABNORMAL LOW (ref >60)
GFR calc non Af Amer: 31 mL/min — ABNORMAL LOW (ref >60)
GLUCOSE: 207 mg/dL — AB (ref 65–99)
POTASSIUM: 4.6 mmol/L (ref 3.5–5.1)
Sodium: 132 mmol/L — ABNORMAL LOW (ref 135–145)

## 2015-11-30 LAB — CBC
HEMATOCRIT: 39.7 % — AB (ref 40.0–52.0)
HEMOGLOBIN: 12.2 g/dL — AB (ref 13.0–18.0)
MCH: 23 pg — AB (ref 26.0–34.0)
MCHC: 30.8 g/dL — AB (ref 32.0–36.0)
MCV: 74.6 fL — ABNORMAL LOW (ref 80.0–100.0)
Platelets: 218 10*3/uL (ref 150–440)
RBC: 5.32 MIL/uL (ref 4.40–5.90)
RDW: 21.7 % — ABNORMAL HIGH (ref 11.5–14.5)
WBC: 9.5 10*3/uL (ref 3.8–10.6)

## 2015-11-30 LAB — T4, FREE: FREE T4: 0.78 ng/dL (ref 0.61–1.12)

## 2015-11-30 LAB — TROPONIN I: Troponin I: <0.03 ng/mL (ref <0.031)

## 2015-11-30 MED ORDER — HYDROCODONE-ACETAMINOPHEN 7.5-325 MG PO TABS
1.0000 | ORAL_TABLET | ORAL | Status: DC | PRN
  Administered 2015-11-30 – 2015-12-01 (×7): 1 via ORAL
  Filled 2015-11-30 (×7): qty 1

## 2015-11-30 MED ORDER — LEVOFLOXACIN 500 MG PO TABS: 250.0000 mg | ORAL_TABLET | Freq: Every day | ORAL | Status: DC

## 2015-11-30 MED ORDER — METHYLPREDNISOLONE SODIUM SUCC 125 MG IJ SOLR
60.0000 mg | Freq: Two times a day (BID) | INTRAMUSCULAR | Status: DC
  Administered 2015-11-30 – 2015-12-01 (×2): 60 mg via INTRAVENOUS
  Filled 2015-11-30 (×2): qty 2

## 2015-11-30 NOTE — Care Management (Signed)
Have requested [facility] approval for this patient with copd/chf and frequent admissions. Found that [facility] is not on call for [facility]- agency on call is   [facility] and this agency has seen patient previously.  Patient has been declined for [facility] . Have asked [facility] for rationale of this decision.  Notified [facility] of referral for SN and the request for [facility].  Patient may also benefit from home palliative care consult for sx mangement.  Patient verbalized desire to have symptoms controlled so he would not have to keep coming back to the hospital

## 2015-11-30 NOTE — Care Management (Addendum)
Patient presents from home with shortness of breath.  has previous history of CHF with EF 25% and copd.  he is admitted with exacerbation of both.  He has chronic pleurx cath and nephrostomy tubes. Says that he empties approximately one liter per day from pleurx.   Chronic home 02 through [facility].  Has had 5 admissions within past 6 months and 4 ED visits.  May benefit from [facility].  Made referral.  Patient is currently open to [place].  Agency will follow up with CM in regards to services.  Patient may benefit from [facility].  Patient does talk about how it is difficult to schedule home health visits due to his multitude of physician appointments.  Says he is tired of coming back and forth to the hospital.  When asked to elaborate he says that when he comes to the hospital they give him pain meds for his kidney stone but when he goes home they just send him home with "little ole pills- like that is going to work."  May benefit from palliative care consult for symptom management.  Informed by [facility] that on last discharge 2/17, no home health services were ordered.  Will speak with patient about a new referral - SN and [facility].  This week it appears [facility] is accepting those referrals

## 2015-11-30 NOTE — Progress Notes (Signed)
Patient ID: BUELL ZERBE, male   DOB: 05-Dec-1940, 75 y.o.   MRN: [id number] [place] [facility] [place] NAME: Collin [name]    MR#:  [id number]  DATE OF BIRTH:  1940-10-25  SUBJECTIVE:  Came in with sob and weakness. Found to have CHF and copd flare. Wife at bedside.  REVIEW OF SYSTEMS:   Review of Systems  Constitutional: Negative for fever, chills and weight loss.  HENT: Negative for ear discharge, ear pain and nosebleeds.   Eyes: Negative for blurred vision, pain and discharge.  Respiratory: Positive for cough, shortness of breath and wheezing. Negative for sputum production and stridor.   Cardiovascular: Negative for chest pain, palpitations, orthopnea and PND.  Gastrointestinal: Negative for nausea, vomiting, abdominal pain and diarrhea.  Genitourinary: Negative for urgency and frequency.  Musculoskeletal: Positive for back pain. Negative for joint pain.  Neurological: Positive for weakness. Negative for sensory change, speech change and focal weakness.  Psychiatric/Behavioral: Negative for depression and hallucinations. The patient is not nervous/anxious.   All other systems reviewed and are negative.  Tolerating [id number] Tolerating diet: yes`  DRUG ALLERGIES:   Allergies  Allergen Reactions  . Lisinopril Anaphylaxis  . Metoprolol Other (See Comments)    Reaction:  Ringing in ears     VITALS:  Blood pressure 126/89, pulse 99, temperature 97.3 F (36.3 C), temperature source Oral, resp. rate 21, height 6\' 2"  (1.88 m), weight 87.783 kg (193 lb 8.4 oz), SpO2 98 %.  PHYSICAL EXAMINATION:  GENERAL:  75 y.o.-year-old patient lying in the bed with no acute distress. Chronically ill EYES: Pupils equal, round, reactive to light and accommodation. No scleral icterus. Extraocular muscles intact.  HEENT: Head atraumatic, normocephalic. Oropharynx and nasopharynx clear.  NECK:  Supple, no jugular venous distention. No thyroid  enlargement, no tenderness.  LUNGS: Coarse breath sounds bilaterally, no wheezing, rales,rhonchi or crepitation. No use of accessory muscles of respiration. pleurx catheter CARDIOVASCULAR: S1, S2 normal. No murmurs, rubs, or gallops.  ABDOMEN: Soft, nontender, nondistended. Bowel sounds present. No organomegaly or mass. Right side nephrostomy tube EXTREMITIES: chronic pedal edema, no cyanosis, or clubbing.  NEUROLOGIC: Cranial nerves II through XII are intact. Muscle strength 4/5 in all extremities. Sensation intact. Gait not checked.  PSYCHIATRIC: patient is alert and oriented x 3.  SKIN: No obvious rash, lesion, or ulcer.   LABORATORY PANEL:   CBC  Recent Labs Lab 11/29/15 2105 11/30/15 0352  WBC 11.7* 9.5  HGB 12.0* 12.2*  HCT 39.8* 39.7*  PLT 279 218   ------------------------------------------------------------------------------------------------------------------ Chemistries   Recent Labs Lab 11/29/15 1740 11/29/15 2105 11/30/15 0352  NA 135  --  132*  K 4.2  --  4.6  CL 98*  --  101  CO2 27  --  23  GLUCOSE 111*  --  207*  BUN 34*  --  39*  CREATININE 2.11* 2.04* 2.01*  CALCIUM 8.5*  --  8.0*  AST 22  --   --   ALT 18  --   --   ALKPHOS 169*  --   --   BILITOT 0.7  --   --    ------------------------------------------------------------------------------------------------------------------  Cardiac Enzymes  Recent Labs Lab 11/29/15 2105 11/30/15 0352  TROPONINI 0.03 <0.03   ------------------------------------------------------------------------------------------------------------------  RADIOLOGY:  Dg Chest 2 View  11/29/2015  CLINICAL DATA:  Pt having SOB for 3 days. Hx of pneumonia, bronchitis, asthma, COPD, hypertension, CHF. He has had AAA repair. Current everyday smoker. EXAM: CHEST  2 VIEW COMPARISON:  10/26/2015 FINDINGS: Cardiac enlargement with pulmonary vascular congestion and mild interstitial edema. Small right pleural effusion with probable  loculation laterally. There appears to be a drain in place. Small left pleural effusion. Fluid in the fissures. Appearances are similar to previous study. Azygos lobe. Calcified and tortuous aorta. No pneumothorax. IMPRESSION: Cardiac enlargement with pulmonary vascular congestion and interstitial edema. Bilateral pleural effusions, greater on the right, with loculated fluid in the right pleural space laterally. Drainage catheter is in place. Appearance is similar previous study. Electronically Signed   By: Lucienne Capers M.D.   On: 11/29/2015 18:25    ASSESSMENT AND PLAN:  Collin [name] is a 75 y.o. male with a known history of 75 year old Caucasian male with a past medical history significant for CHF with EF of 26%, COPD, need for home oxygen, previous history of kidney stone, chronic kidney disease, hypertension, hyperlipidemia and peripheral vascular disease. He presents to ED today due to a two-day history of worsening shortness of breath. Patient developed shortness of breath about 2 days ago and has gradually worsened over the last 2 days  1). Acute CHF exacerbation with elevated troponin - Patient has a chronic history of congestive heart failure with systolic depression. EF is rated at about 26%. - He presents with shortness of breath associated with orthopnea, pedal edema and PND. This is consistent with CHF exacerbation. BNP is greater than 4500. - He received 80 mg of Lasix in the ED - continue 80 mg every 12 hours. - Continue carvedilol - Troponin is mildly elevated at 0.05.  - elevated TSH check free t3 and t4 - Measure daily weights, strict intake and output as well as fluid restriction. - Continue oxygen supplementation and DuoNeb's.  2). COPD exacerbation - Patient has been wheezing and coughing. Cough is productive of mildly yellowish sputum. I suspect the patient has a component of COPD exacerbation as well. - Continue supplementary oxygen due to increased oxygen  requirements. - Continue duo nebs and start Lovenox and. - IV steroids started. - Guaifenesin for cough. - spriva and  DuoNeb  3). Acute or chronic respiratory failure with acute - This is lead to secondary to acute CHF exacerbation and COPD exacerbation. Continue management as stated above.  4). Recurrent pleural effusion -  chronic and will continue to drain chest tube(pleurx)  5). Tobacco abuse - Patient smokes about 3-5 cigarettes per day. I have extensively counseled him on the need to quit smoking. Counseling lasted for about 3-4 minutes.  6) nephrolithiasis s/p chronic nephrostomy tube placement -pt follows with Dr Erlene Quan as out pt  All the records are reviewed and case discussed with Care Management/Social Workerr. Management plans discussed with the patient, family and they are in agreement.  CODE STATUS: full  TOTAL TIME TAKING CARE OF THIS PATIENT: 30 minutes POSSIBLE D/C IN 1-2 DEPENDING ON CLINICAL CONDITION.   Raegen Tarpley M.D on 11/30/2015 at 2:43 PM  Between 7am to 6pm - Pager - [phone]  After 6pm go to [email or web address] - password [email or web address] [place] [facility]  Office  [phone]  CC: Primary care physician; [name], MD

## 2015-11-30 NOTE — Consult Note (Signed)
   [facility] Inpatient Consult   11/30/2015  Collin [name] [birth year] [id number]  Thank you for this consult. Patient was evaluated for eligibility of [facility] care management services.This patient is Not eligible for [facility] Care Management Services.  Reason: Not a beneficiary currently attributed to one of the [place]. Membership roster used to verify non- eligible status. For questions please call:  Annalysa Mohammad RN, [place] Liaison  [phone]) Business Mobile [phone]) Toll free office

## 2015-12-01 ENCOUNTER — Telehealth: Payer: Self-pay

## 2015-12-01 MED ORDER — FUROSEMIDE 40 MG PO TABS: 40.0000 mg | ORAL_TABLET | Freq: Two times a day (BID) | ORAL | Status: DC

## 2015-12-01 MED ORDER — DOCUSATE SODIUM 100 MG PO CAPS
100.0000 mg | ORAL_CAPSULE | Freq: Two times a day (BID) | ORAL | Status: DC
  Administered 2015-12-01: 100 mg via ORAL
  Filled 2015-12-01: qty 1

## 2015-12-01 MED ORDER — HYDROCODONE-ACETAMINOPHEN 7.5-325 MG PO TABS: 1.0000 | ORAL_TABLET | ORAL | Status: DC | PRN

## 2015-12-01 MED ORDER — FUROSEMIDE 40 MG PO TABS: 40.0000 mg | ORAL_TABLET | Freq: Three times a day (TID) | ORAL | Status: DC

## 2015-12-01 NOTE — Telephone Encounter (Signed)
Initial appointment made with the [facility] on December 29, 2015 at 10:30 AM. This is the 3rd attempt in making an appointment, patient has cancelled on 01/12/2015 and 09/15/2015 with no reschedule.

## 2015-12-01 NOTE — Care Management (Addendum)
Patient is for discharge today and is agreeable to home health nurse.  Described the [place] program and patient "will only agree to 2 visits a week."  He adamantly declines palliative care as he says he does not need his symptoms managed any better.  His wife would like for him to have these services and nursing visits under [facility] but says patient is stubborn.  Provided wife with information on in home palliative care .  It turns out that [facility] is on call for [facility] and not [facility].  Referral called an accepted

## 2015-12-01 NOTE — Progress Notes (Signed)
Pt requesting laxative because he hasn't had a BM since admission. MD Dr. Estanislado Pandy notified. Orders given for colace BID. Rachael Fee, RN

## 2015-12-01 NOTE — Discharge Summary (Signed)
[facility] [place] NAME: Collin [name]    MR#:  [id number]  DATE OF BIRTH:  [birth year]  DATE OF ADMISSION:  11/29/2015 ADMITTING PHYSICIAN: Hillary Bow, MD  DATE OF DISCHARGE: 12/01/2015  PRIMARY CARE PHYSICIAN: [name], MD    ADMISSION DIAGNOSIS:  Acute on chronic congestive heart failure, unspecified congestive heart failure type ([place]) [I50.9]  DISCHARGE DIAGNOSIS:  Acute on chronic systolic congestive heart failure resolved Chronic respiratory failure on chronic home oxygen Chronic COPD Nephrolithiasis status post chronic nephrostomy tube follows with Dr. 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 COPD (chronic obstructive pulmonary disease) ([place])   . Chronic systolic CHF (congestive heart failure) ([place])   . Kidney stone   . Macular degeneration   . HLD (hyperlipidemia)   . Hypertensive retinopathy   . HTN (hypertension)   . GERD (gastroesophageal reflux disease)   . Peripheral vascular disease of lower extremity [facility])     HOSPITAL COURSE:   Collin [name] is a 75 y.o. male with a known history of 76 year old Caucasian male with a past medical history significant for CHF with EF of 26%, COPD, need for home oxygen, previous history of kidney stone, chronic kidney disease, hypertension, hyperlipidemia and peripheral vascular disease. He presents to ED today due to a two-day history of worsening shortness of breath. Patient developed shortness of breath about 2 days ago and has gradually worsened over the last 2 days  1). Acute CHF exacerbation with elevated troponin - Patient has a chronic history of congestive heart failure with systolic depression. EF is rated at about 26%. - He presents with shortness of breath associated with orthopnea,  pedal edema and PND. This is consistent with CHF exacerbation. BNP is greater than 4500. - He received 80 mg of Lasix in the ED - continue 80 mg every 12 hours--patient diuresed 2.3 L. Change to by mouth Lasix 40 mg twice a day - Continue carvedilol - Troponin is mildly elevated at 0.05.  - elevated TSH and free T4 within normal limits, appears sickly euthyroid. We'll defer to primary care physician for follow-up TSH - Continue oxygen supplementation and DuoNeb's.  2). COPD exacerbation - Patient has been wheezing and coughing. Cough is productive of mildly yellowish sputum.  suspect the patient has a component of COPD exacerbation as well. - Continue supplementary oxygen due to increased oxygen requirements. - Continue duo nebs and start Lovenox and. Received IV steroids Nadara Mustard now not wheezing does not require prednisone taper. - Guaifenesin for cough. - spriva and DuoNeb  3). Acute or chronic respiratory failure with acute - This is lead to secondary to acute CHF exacerbation and COPD exacerbation. Continue management as stated above.  4). Recurrent pleural effusion - chronic and will continue to drain chest tube(pleurx)  5). Tobacco abuse - Patient smokes about 3-5 cigarettes per day. I have extensively counseled him on the need to quit smoking. Counseling lasted for about 3-4 minutes.  6) nephrolithiasis s/p chronic nephrostomy tube placement -pt follows with Dr Erlene Quan as out pt  Overall at baseline. We'll discharge patient to home with outpatient follow-up with cardiology and primary care CONSULTS OBTAINED:     DRUG ALLERGIES:   Allergies  Allergen Reactions  . Lisinopril Anaphylaxis  . Metoprolol Other (See Comments)    Reaction:  Ringing in ears  DISCHARGE MEDICATIONS:   Current Discharge Medication List    CONTINUE these medications which have CHANGED   Details  furosemide (LASIX) 40 MG tablet Take 1 tablet (40 mg total) by mouth 2 (two) times  daily. Qty: 60 tablet, Refills: 3    !! HYDROcodone-acetaminophen (NORCO) 7.5-325 MG tablet Take 1 tablet by mouth every 4 (four) hours as needed for moderate pain. Qty: 30 tablet, Refills: 0     !! - Potential duplicate medications found. Please discuss with provider.    CONTINUE these medications which have NOT CHANGED   Details  acetaminophen (TYLENOL) 500 MG tablet Take 1,000 mg by mouth every 6 (six) hours as needed for mild pain.    albuterol (PROVENTIL HFA;VENTOLIN HFA) 108 (90 BASE) MCG/ACT inhaler Inhale 2 puffs into the lungs every 4 (four) hours as needed for wheezing or shortness of breath.     ALPRAZolam (XANAX) 0.25 MG tablet Take 1 tablet (0.25 mg total) by mouth 2 (two) times daily as needed for anxiety or sleep. Qty: 30 tablet, Refills: 0    aspirin EC 81 MG tablet Take 81 mg by mouth daily.    carvedilol (COREG) 6.25 MG tablet Take 6.25 mg by mouth 2 (two) times daily.     Fluticasone-Salmeterol (ADVAIR) 100-50 MCG/DOSE AEPB Inhale 1 puff into the lungs 2 (two) times daily.    !! HYDROcodone-acetaminophen (NORCO) 7.5-325 MG tablet Take 1 tablet by mouth every 6 (six) hours as needed for moderate pain.    Multiple Vitamin (MULTIVITAMIN WITH MINERALS) TABS tablet Take 1 tablet by mouth daily.    nitroGLYCERIN (NITROSTAT) 0.4 MG SL tablet Take 0.4 mg by mouth every 5 (five) minutes x 3 doses as needed for chest pain.     omeprazole (PRILOSEC) 20 MG capsule Take 20 mg by mouth 2 (two) times daily.     tiotropium (SPIRIVA HANDIHALER) 18 MCG inhalation capsule Place 1 capsule (18 mcg total) into inhaler and inhale daily. Qty: 30 capsule, Refills: 0     !! - Potential duplicate medications found. Please discuss with provider.    STOP taking these medications     oxyCODONE-acetaminophen (ROXICET) 5-325 MG tablet         If you experience worsening of your admission symptoms, develop shortness of breath, life threatening emergency, suicidal or homicidal thoughts  you must seek medical attention immediately by calling 911 or calling your MD immediately  if symptoms less severe.  You Must read complete instructions/literature along with all the possible adverse reactions/side effects for all the Medicines you take and that have been prescribed to you. Take any new Medicines after you have completely understood and accept all the possible adverse reactions/side effects.   Please note  You were cared for by a hospitalist during your hospital stay. If you have any questions about your discharge medications or the care you received while you were in the hospital after you are discharged, you can call the unit and asked to speak with the hospitalist on call if the hospitalist that took care of you is not available. Once you are discharged, your primary care physician will handle any further medical issues. Please note that NO REFILLS for any discharge medications will be authorized once you are discharged, as it is imperative that you return to your primary care physician (or establish a relationship with a primary care physician if you do not have one) for your aftercare needs so that they can reassess your need for medications and monitor your  lab values. Today   SUBJECTIVE   Walking out in the hallways with his wife using a walker  VITAL SIGNS:  Blood pressure 129/78, pulse 95, temperature 97.7 F (36.5 C), temperature source Oral, resp. rate 20, height 6\' 2"  (1.88 m), weight 87.998 kg (194 lb), SpO2 97 %.  I/O:   Intake/Output Summary (Last 24 hours) at 12/01/15 1105 Last data filed at 12/01/15 0830  Gross per 24 hour  Intake    240 ml  Output   2350 ml  Net  -2110 ml    PHYSICAL EXAMINATION:  GENERAL:  75 y.o.-year-old patient lying in the bed with no acute distress.  EYES: Pupils equal, round, reactive to light and accommodation. No scleral icterus. Extraocular muscles intact.  HEENT: Head atraumatic, normocephalic. Oropharynx and nasopharynx  clear.  NECK:  Supple, no jugular venous distention. No thyroid enlargement, no tenderness.  LUNGS:distant breath sounds bilaterally, no wheezing, rales,rhonchi or crepitation. No use of accessory muscles of respiration.  CARDIOVASCULAR: S1, S2 normal. No murmurs, rubs, or gallops.  ABDOMEN: Soft, non-tender, non-distended. Bowel sounds present. No organomegaly or mass.  EXTREMITIES: No pedal edema, cyanosis, or clubbing.  NEUROLOGIC: Cranial nerves II through XII are intact. Muscle strength 4/5 in all extremities. Sensation intact. Gait not checked.  PSYCHIATRIC:patient is alert and oriented x 3.  SKIN: No obvious rash, lesion, or ulcer.   DATA REVIEW:   CBC   Recent Labs Lab 11/30/15 0352  WBC 9.5  HGB 12.2*  HCT 39.7*  PLT 218    Chemistries   Recent Labs Lab 11/29/15 1740  11/30/15 0352  NA 135  --  132*  K 4.2  --  4.6  CL 98*  --  101  CO2 27  --  23  GLUCOSE 111*  --  207*  BUN 34*  --  39*  CREATININE 2.11*  < > 2.01*  CALCIUM 8.5*  --  8.0*  AST 22  --   --   ALT 18  --   --   ALKPHOS 169*  --   --   BILITOT 0.7  --   --   < > = values in this interval not displayed.  Microbiology Results   No results found for this or any previous visit (from the past 240 hour(s)).  RADIOLOGY:  Dg Chest 2 View  11/29/2015  CLINICAL DATA:  Pt having SOB for 3 days. Hx of pneumonia, bronchitis, asthma, COPD, hypertension, CHF. He has had AAA repair. Current everyday smoker. EXAM: CHEST  2 VIEW COMPARISON:  10/26/2015 FINDINGS: Cardiac enlargement with pulmonary vascular congestion and mild interstitial edema. Small right pleural effusion with probable loculation laterally. There appears to be a drain in place. Small left pleural effusion. Fluid in the fissures. Appearances are similar to previous study. Azygos lobe. Calcified and tortuous aorta. No pneumothorax. IMPRESSION: Cardiac enlargement with pulmonary vascular congestion and interstitial edema. Bilateral pleural  effusions, greater on the right, with loculated fluid in the right pleural space laterally. Drainage catheter is in place. Appearance is similar previous study. Electronically Signed   By: Lucienne Capers M.D.   On: 11/29/2015 18:25     Management plans discussed with the patient, family and they are in agreement.  CODE STATUS:     Code Status Orders        Start     Ordered   11/29/15 2028  Full code   Continuous     11/29/15 2027    Code Status History  Date Active Date Inactive Code Status Order ID Comments User Context   10/26/2015 10:08 PM 10/29/2015  5:25 PM Full Code [id number]  Samson Frederic, DO Inpatient   08/22/2015  5:44 PM 08/31/2015  4:39 PM Full Code [id number]  Theodoro Grist, MD Inpatient   07/07/2015  1:57 PM 07/08/2015  2:28 PM Full Code [id number]  Nestor Lewandowsky, MD Inpatient   06/12/2015  3:54 PM 06/14/2015  5:01 PM Full Code [id number]  Baxter Hire, MD Inpatient   03/20/2015 12:20 AM 03/21/2015  9:25 PM Full Code [id number]  Lance Coon, MD ED   03/04/2015  5:52 AM 03/06/2015  2:26 PM Full Code [id number]  Lance Coon, MD Inpatient      TOTAL TIME TAKING CARE OF THIS PATIENT: 40 minutes.    Morrell Fluke M.D on 12/01/2015 at 11:05 AM  Between 7am to 6pm - Pager - [phone] After 6pm go to [email or web address] - password [email or web address]  Office  [phone]  CC: Primary care physician; [name], MD

## 2015-12-01 NOTE — Care Management Important Message (Signed)
Important Message  Patient Details  Name: Collin [name] MRN: [id number] Date of Birth: 15-Nov-1940   Medicare Important Message Given:  Yes    Juliann Pulse A Kalina Morabito 12/01/2015, 11:50 AM

## 2015-12-01 NOTE — Patient Instructions (Signed)
Follow up with: [facility]  December 29, 2015 at 10:30 Am [phone]

## 2015-12-03 ENCOUNTER — Encounter: Payer: Self-pay | Admitting: Urology

## 2015-12-03 ENCOUNTER — Ambulatory Visit (INDEPENDENT_AMBULATORY_CARE_PROVIDER_SITE_OTHER): Payer: Medicare Other | Admitting: Urology

## 2015-12-03 VITALS — BP 133/83 | HR 97 | Ht 74.0 in | Wt 183.0 lb

## 2015-12-03 DIAGNOSIS — N179 Acute kidney failure, unspecified: Secondary | ICD-10-CM

## 2015-12-03 DIAGNOSIS — N189 Chronic kidney disease, unspecified: Secondary | ICD-10-CM | POA: Diagnosis not present

## 2015-12-03 DIAGNOSIS — Z960 Presence of urogenital implants: Secondary | ICD-10-CM

## 2015-12-03 DIAGNOSIS — N132 Hydronephrosis with renal and ureteral calculous obstruction: Secondary | ICD-10-CM

## 2015-12-03 DIAGNOSIS — B3749 Other urogenital candidiasis: Secondary | ICD-10-CM

## 2015-12-03 DIAGNOSIS — M48061 Spinal stenosis, lumbar region without neurogenic claudication: Secondary | ICD-10-CM | POA: Insufficient documentation

## 2015-12-03 NOTE — Progress Notes (Signed)
4:23 PM  12/03/2015  Collin [name] [birth year] [id number]  Referring provider: [name], MD [facility] [place], [place] [address]  Chief Complaint  Patient presents with  . Hydronephrosis    18month    HPI: 75 yo male with significant comorbidities including COPD, chronic pleural effusion with Pleurx catheter, CHF, and acute on chronic renal failure admitted 12/16 with severe worsening of his renal function found to have a retained right ureteral stent, significant progression of his bilateral nephrolithiasis, and right hydronephrosis consistent with an obstructed nonfunctioning stent. During that admission, he underwent placement of a right percutaneous nephrostomy tube to decompress his right collecting system and ultimately his renal function improved voiding need for dialysis and he was also treated for right pyelonephritis.     He returned to the clinic in January 2017 to discuss definitive management of his encrusted ureteral stent and right-sided stone burden.   At that point, he did want to proceed with right ureteroscopy for staged procedure in attempt to remove his right encrusted ureteral stent and treat his right-sided stone burden. Unfortunately, when presenting to preop, he was readmitted with a CHF exacerbation it has been in and out of the hospital since  It was discharged as recently as  2 days ago.  He's been extremely ill over the past year and has multiple medical comorbidities. He is oxygen dependent and followed by Dr. Ubaldo Glassing and Dr. Raul Del for his pulmonary and cardiac issues. He's also been seeing Dr. Genevive Bi with consideration for right pleurodesis ( although have not seen any evidence that he is followed up for this).  His nephrostomy tube was last changed back in  10/29/2015. He is due for another nephrostomy tube exchange in the near future. He is very bothered by the tube and feels as though his stones are moving. Today, he also  complains of left flank pain over the past month. He is worried that he is passing a stone on this left side as well. He has not been voiding very much per urethra with primary output from his right nephrostomy tube. This is fairly new since he developed left flank pain.   PMH: Past Medical History  Diagnosis Date  . COPD (chronic obstructive pulmonary disease) ([place])   . Chronic systolic CHF (congestive heart failure) ([place])   . Kidney stone   . Macular degeneration   . HLD (hyperlipidemia)   . Hypertensive retinopathy   . HTN (hypertension)   . GERD (gastroesophageal reflux disease)   . Peripheral vascular disease of lower extremity [facility])     Surgical History: Past Surgical History  Procedure Laterality Date  . Abdominal aortic aneurysm repair    . Hernia repair    . Kidney stone surgery    . Chest tube insertion Right 07/07/2015    Procedure: INSERTION PLEURAL DRAINAGE CATHETER;  Surgeon: Nestor Lewandowsky, MD;  Location: [facility] ORS;  Service: Thoracic;  Laterality: Right;  . Peripheral vascular catheterization N/A 11/03/2015    Procedure: Abdominal Aortogram w/Lower Extremity;  Surgeon: Algernon Huxley, MD;  Location: [place] CV LAB;  Service: Cardiovascular;  Laterality: N/A;  . Peripheral vascular catheterization  11/03/2015    Procedure: Lower Extremity Intervention;  Surgeon: Algernon Huxley, MD;  Location: [place] CV LAB;  Service: Cardiovascular;;  . Peripheral vascular catheterization Left 11/10/2015    Procedure: Lower Extremity Angiography;  Surgeon: Algernon Huxley, MD;  Location: [place] CV LAB;  Service: Cardiovascular;  Laterality:  Left;  . Peripheral vascular catheterization  11/10/2015    Procedure: Lower Extremity Intervention;  Surgeon: Algernon Huxley, MD;  Location: [place] CV LAB;  Service: Cardiovascular;;  . Peripheral vascular stent Left     Home Medications:    Medication List       This list is accurate as of: 12/03/15  4:23 PM.  Always use your most  recent med list.               acetaminophen 500 MG tablet  Commonly known as:  TYLENOL  Take 1,000 mg by mouth every 6 (six) hours as needed for mild pain.     albuterol 108 (90 Base) MCG/ACT inhaler  Commonly known as:  PROVENTIL HFA;VENTOLIN HFA  Inhale 2 puffs into the lungs every 4 (four) hours as needed for wheezing or shortness of breath.     ALPRAZolam 0.25 MG tablet  Commonly known as:  XANAX  Take 1 tablet (0.25 mg total) by mouth 2 (two) times daily as needed for anxiety or sleep.     aspirin EC 81 MG tablet  Take 81 mg by mouth daily.     carvedilol 6.25 MG tablet  Commonly known as:  COREG  Take 6.25 mg by mouth 2 (two) times daily.     Fluticasone-Salmeterol 100-50 MCG/DOSE Aepb  Commonly known as:  ADVAIR  Inhale 1 puff into the lungs 2 (two) times daily.     furosemide 40 MG tablet  Commonly known as:  LASIX  Take 1 tablet (40 mg total) by mouth 2 (two) times daily.     HYDROcodone-acetaminophen 7.5-325 MG tablet  Commonly known as:  NORCO  Take 1 tablet by mouth every 4 (four) hours as needed for moderate pain.     multivitamin with minerals Tabs tablet  Take 1 tablet by mouth daily.     NITROSTAT 0.4 MG SL tablet  Generic drug:  nitroGLYCERIN  Take 0.4 mg by mouth every 5 (five) minutes x 3 doses as needed for chest pain.     omeprazole 20 MG capsule  Commonly known as:  PRILOSEC  Take 20 mg by mouth 2 (two) times daily.     tiotropium 18 MCG inhalation capsule  Commonly known as:  SPIRIVA HANDIHALER  Place 1 capsule (18 mcg total) into inhaler and inhale daily.        Allergies:  Allergies  Allergen Reactions  . Lisinopril Anaphylaxis  . Metoprolol Other (See Comments)    Reaction:  Ringing in ears     Family History: Family History  Problem Relation Age of Onset  . Heart attack Mother   . Stroke Mother   . Cancer Father   . COPD Father   . Heart attack Father     Social History:  reports that he has been smoking Cigarettes.   He has a 11.25 pack-year smoking history. He has never used smokeless tobacco. He reports that he does not drink alcohol or use illicit drugs.  ROS: UROLOGY Frequent Urination?: No Hard to postpone urination?: No Burning/pain with urination?: Yes Get up at night to urinate?: No Leakage of urine?: No Urine stream starts and stops?: No Trouble starting stream?: No Do you have to strain to urinate?: No Blood in urine?: No Urinary tract infection?: No Sexually transmitted disease?: No Injury to kidneys or bladder?: No Painful intercourse?: No Weak stream?: No Erection problems?: No Penile pain?: No  Gastrointestinal Nausea?: Yes Vomiting?: No Indigestion/heartburn?: Yes Diarrhea?: Yes Constipation?: No  Constitutional Fever: No Night sweats?: No Weight loss?: No Fatigue?: Yes  Skin Skin rash/lesions?: No Itching?: No  Eyes Blurred vision?: No Double vision?: No  Ears/Nose/Throat Sore throat?: Yes Sinus problems?: No  Hematologic/Lymphatic Swollen glands?: No Easy bruising?: Yes  Cardiovascular Leg swelling?: Yes Chest pain?: No  Respiratory Cough?: Yes Shortness of breath?: Yes  Endocrine Excessive thirst?: Yes  Musculoskeletal Back pain?: Yes Joint pain?: No  Neurological Headaches?: Yes Dizziness?: No  Psychologic Depression?: No Anxiety?: Yes  Physical Exam: BP 133/83 mmHg  Pulse 97  Ht 6\' 2"  (1.88 m)  Wt 183 lb (83.008 kg)  BMI 23.49 kg/m2 O2 sat 95% on room air. Constitutional:  Alert and oriented, No acute distress.  Sitting in wheelchair. Generally unwell appearing. Wife is present with him today.   Appears better today that on previous occasions. HEENT: [place] AT, moist mucus membranes.  Trachea midline, no masses. Cardiovascular: No clubbing, cyanosis.  1+ lower extremity pitting edema bilaterally. Respiratory: Normal respiratory effort, no increased work of breathing.  Right Pleurx catheter in place, dressing overlying. GI: Abdomen  is soft, nontender, nondistended, no abdominal masses. GU: No CVA tenderness. Right nephrostomy tube clear. Site clean dry and intact. Skin: No rashes, bruises or suspicious lesions.. Neurologic: Grossly intact, no focal deficits, moving all 4 extremities. Psychiatric: Normal mood and affect.  Laboratory Data: Lab Results  Component Value Date   WBC 9.5 11/30/2015   HGB 12.2* 11/30/2015   HCT 39.7* 11/30/2015   MCV 74.6* 11/30/2015   PLT 218 11/30/2015    Lab Results  Component Value Date   CREATININE 2.01* 11/30/2015    Lab Results  Component Value Date   HGBA1C 6.2* 08/22/2015    Pertinent Imaging: CT stone CLINICAL DATA: Right lower quadrant pain x2 days, recurrent UTIs, renal stones, and renal failure  EXAM: CT ABDOMEN AND PELVIS WITHOUT CONTRAST  TECHNIQUE: Multidetector CT imaging of the abdomen and pelvis was performed following the standard protocol without IV contrast.  COMPARISON: 07/11/2014  FINDINGS: Lower chest: Moderate to large right pleural effusion, incompletely visualized. Associated compressive atelectasis in the right lower lobe. Trace left pleural effusion.  Hepatobiliary: Liver is grossly unremarkable.  Gallbladder is notable for layering sludge and/or noncalcified gallstones (series 2/image 30). No associated inflammatory changes. No intrahepatic or extrahepatic ductal dilatation.  Pancreas: Pancreatic atrophy.  Spleen: Within normal limits.  Adrenals/Urinary Tract: Adrenal glands are unremarkable.  Left kidney is unremarkable. Fullness of the left renal collecting system. 14 x 6 mm ovoid calculus in the left proximal collecting system (series 2/ image 26). Additional 9 x 9 mm irregular calculus in the left proximal collecting system (series 2/ image 28). Surrounding urothelial thickening, reflecting chronic inflammation.  Right kidney is notable for perirenal edema. 2.0 x 1.1 cm ovoid calculus in the right upper pole  calyx (series 2/ image 36). Moderate right hydronephrosis with indwelling double-pigtail ureteral catheter. Additional 12 x 9 mm ovoid calculus in the right proximal ureter adjacent to the catheter (series 2/ image 41).  Distal portion of the right pigtail catheter terminates in the bladder (series 2/ image 68), which is mildly thick-walled but underdistended.  Stomach/Bowel: Stomach is within normal limits.  No evidence of bowel obstruction.  Appendix is not discretely visualized.  Vascular/Lymphatic: Atherosclerotic calcifications of the abdominal aorta and branch vessels. 4.0 x 4.0 cm suprarenal abdominal aortic aneurysm (series 2/ image 22). Aorto bi-iliac stent.  No suspicious abdominopelvic lymphadenopathy.  Reproductive: Prostate is notable for dystrophic calcifications.  Other: Small volume right  pelvic ascites.  Tiny fat containing left inguinal hernia.  Musculoskeletal: Degenerative changes of the visualized thoracolumbar spine, most prominent at L5-S1.  IMPRESSION: Moderate right hydronephrosis with indwelling double pigtail ureteral catheter. 2.0 x 1.1 cm right upper pole renal calculus. Additional 12 x 9 mm right proximal ureteral calculus adjacent to the catheter.  Two calculi in the left proximal collecting system measuring 14 x 6 mm and 9 x 9 mm associated urothelial thickening, reflecting chronic inflammation.  Bladder wall thickening, correlate for cystitis.  4.0 x 4.0 cm suprarenal abdominal aortic aneurysm with indwelling aorto bi-iliac stent.  Moderate to large right pleural effusion, incompletely visualized. Associated compressive atelectasis in the right lower lobe. Trace left pleural effusion.  Additional ancillary findings as above.   Electronically Signed  By: Julian Hy M.D.  On: 06/06/2015 13:50  [place] CLINICAL DATA: Acute renal failure. Urolithiasis and hydronephrosis. Chronic systolic congestive heart  failure.  EXAM: RENAL / URINARY TRACT ULTRASOUND COMPLETE  COMPARISON: Noncontrast CT on 06/06/2015  FINDINGS: Right Kidney:  Length: 13.9 cm. A 2 cm shadowing calculus is seen in the upper pole of the right kidney. Moderate right hydronephrosis is seen with the right ureteral stent in place. This appears similar to previous CT.  Left Kidney:  Length: 10.1 cm. Several small less than 1 cm shadowing calculi are seen. A 1.8 cm cyst is also seen in the midpole. No definite renal mass identified. No evidence of hydronephrosis.  Bladder:  Empty with Foley catheter in place. Small amount of ascites noted.  IMPRESSION: Moderate right hydronephrosis with right ureteral stent in place. This is similar in appearance to recent CT.  No left hydronephrosis.  Bilateral nephrolithiasis.  Mild ascites.   Electronically Signed  By: Earle Gell M.D.  On: 08/23/2015 15:43  Assessment & Plan:    1. Hydronephrosis with urinary obstruction due to renal calculus  We had a lengthy discussion today again regarding whether or not to proceed with staged right ureteroscopy with the goal of removing his stent in treating his right-sided stone burden. I do think he will require multiple procedures and I reiterated today that he is extremely high risk for anesthesia and postoperative complications.   In fact, I explained that there is a possibility that he could have a complication resulting in mortality given his very fragile medical status.  As such, I have offered him to continue with chronic nephrostomy tube exchanges as an alternative to ureteroscopy. He is very bothered by the stent and the stones and may be willing to take his chances to improve his quality of life.  He and his wife would like to think and pray about how to proceed.  They will call our office next week to let us know if he would like to be booked for nephrostomy tube exchange vs. Ureteroscopy.    He will need  to have cardiac and pulmonary clearance for the operating room as he is a very poor surgical candidate plan to reach out Dr. Raul Del and Dr. Ubaldo Glassing for preop clearance/ optimization.    - CULTURE, URINE COMPREHENSIVE (preop, from right nephrostomy tube) -preop BMP/ CBC -cardiac and pulmonary optimization, clearance needed -OK to stay on ASA 81 mg  2. Retained ureteral stent As above   3. Acute on chronic renal failure (HCC) Recheck BMP preop  4. Candida UTI Colonized.  Plan for preop triple abx including diflucan.     Hollice Espy, MD  [facility] [address] [place], [place] [address] [phone]  I spent 30 min with this patient of which greater than 50% was spent in counseling and coordination of care with the patient. All he and his wife's questions were answered in detail.

## 2015-12-07 ENCOUNTER — Emergency Department: Payer: Medicare Other

## 2015-12-07 ENCOUNTER — Inpatient Hospital Stay
Admission: EM | Admit: 2015-12-07 | Discharge: 2015-12-10 | DRG: 291 | Disposition: A | Discharge status: Home / Self Care | Payer: Medicare Other | Attending: Internal Medicine | Admitting: Internal Medicine

## 2015-12-07 ENCOUNTER — Other Ambulatory Visit: Payer: Self-pay

## 2015-12-07 ENCOUNTER — Encounter: Payer: Self-pay | Admitting: Emergency Medicine

## 2015-12-07 DIAGNOSIS — I214 Non-ST elevation (NSTEMI) myocardial infarction: Secondary | ICD-10-CM | POA: Diagnosis not present

## 2015-12-07 DIAGNOSIS — Z8249 Family history of ischemic heart disease and other diseases of the circulatory system: Secondary | ICD-10-CM | POA: Diagnosis not present

## 2015-12-07 DIAGNOSIS — N189 Chronic kidney disease, unspecified: Secondary | ICD-10-CM | POA: Diagnosis present

## 2015-12-07 DIAGNOSIS — Z87442 Personal history of urinary calculi: Secondary | ICD-10-CM

## 2015-12-07 DIAGNOSIS — F17210 Nicotine dependence, cigarettes, uncomplicated: Secondary | ICD-10-CM | POA: Diagnosis present

## 2015-12-07 DIAGNOSIS — Z825 Family history of asthma and other chronic lower respiratory diseases: Secondary | ICD-10-CM | POA: Diagnosis not present

## 2015-12-07 DIAGNOSIS — J441 Chronic obstructive pulmonary disease with (acute) exacerbation: Secondary | ICD-10-CM | POA: Diagnosis present

## 2015-12-07 DIAGNOSIS — R7989 Other specified abnormal findings of blood chemistry: Secondary | ICD-10-CM | POA: Diagnosis not present

## 2015-12-07 DIAGNOSIS — J9620 Acute and chronic respiratory failure, unspecified whether with hypoxia or hypercapnia: Secondary | ICD-10-CM | POA: Diagnosis not present

## 2015-12-07 DIAGNOSIS — H35039 Hypertensive retinopathy, unspecified eye: Secondary | ICD-10-CM | POA: Diagnosis present

## 2015-12-07 DIAGNOSIS — J189 Pneumonia, unspecified organism: Secondary | ICD-10-CM | POA: Diagnosis not present

## 2015-12-07 DIAGNOSIS — R739 Hyperglycemia, unspecified: Secondary | ICD-10-CM | POA: Diagnosis present

## 2015-12-07 DIAGNOSIS — I96 Gangrene, not elsewhere classified: Secondary | ICD-10-CM | POA: Diagnosis not present

## 2015-12-07 DIAGNOSIS — Z936 Other artificial openings of urinary tract status: Secondary | ICD-10-CM

## 2015-12-07 DIAGNOSIS — Z9111 Patient's noncompliance with dietary regimen: Secondary | ICD-10-CM

## 2015-12-07 DIAGNOSIS — Z7952 Long term (current) use of systemic steroids: Secondary | ICD-10-CM

## 2015-12-07 DIAGNOSIS — T380X5A Adverse effect of glucocorticoids and synthetic analogues, initial encounter: Secondary | ICD-10-CM | POA: Diagnosis present

## 2015-12-07 DIAGNOSIS — Z823 Family history of stroke: Secondary | ICD-10-CM | POA: Diagnosis not present

## 2015-12-07 DIAGNOSIS — Z888 Allergy status to other drugs, medicaments and biological substances status: Secondary | ICD-10-CM

## 2015-12-07 DIAGNOSIS — E785 Hyperlipidemia, unspecified: Secondary | ICD-10-CM | POA: Diagnosis present

## 2015-12-07 DIAGNOSIS — I739 Peripheral vascular disease, unspecified: Secondary | ICD-10-CM | POA: Diagnosis present

## 2015-12-07 DIAGNOSIS — I10 Essential (primary) hypertension: Secondary | ICD-10-CM | POA: Diagnosis not present

## 2015-12-07 DIAGNOSIS — K219 Gastro-esophageal reflux disease without esophagitis: Secondary | ICD-10-CM | POA: Diagnosis present

## 2015-12-07 DIAGNOSIS — I5023 Acute on chronic systolic (congestive) heart failure: Secondary | ICD-10-CM | POA: Diagnosis present

## 2015-12-07 DIAGNOSIS — R600 Localized edema: Secondary | ICD-10-CM | POA: Diagnosis not present

## 2015-12-07 DIAGNOSIS — Z79899 Other long term (current) drug therapy: Secondary | ICD-10-CM

## 2015-12-07 DIAGNOSIS — I130 Hypertensive heart and chronic kidney disease with heart failure and stage 1 through stage 4 chronic kidney disease, or unspecified chronic kidney disease: Principal | ICD-10-CM | POA: Diagnosis present

## 2015-12-07 DIAGNOSIS — I5021 Acute systolic (congestive) heart failure: Secondary | ICD-10-CM | POA: Diagnosis not present

## 2015-12-07 DIAGNOSIS — Z7982 Long term (current) use of aspirin: Secondary | ICD-10-CM

## 2015-12-07 DIAGNOSIS — J440 Chronic obstructive pulmonary disease with acute lower respiratory infection: Secondary | ICD-10-CM | POA: Diagnosis not present

## 2015-12-07 DIAGNOSIS — J9621 Acute and chronic respiratory failure with hypoxia: Secondary | ICD-10-CM | POA: Diagnosis present

## 2015-12-07 DIAGNOSIS — F411 Generalized anxiety disorder: Secondary | ICD-10-CM | POA: Diagnosis present

## 2015-12-07 DIAGNOSIS — H3530 Unspecified macular degeneration: Secondary | ICD-10-CM | POA: Diagnosis present

## 2015-12-07 DIAGNOSIS — Y95 Nosocomial condition: Secondary | ICD-10-CM | POA: Diagnosis present

## 2015-12-07 DIAGNOSIS — I509 Heart failure, unspecified: Secondary | ICD-10-CM | POA: Diagnosis not present

## 2015-12-07 DIAGNOSIS — R0602 Shortness of breath: Secondary | ICD-10-CM | POA: Diagnosis not present

## 2015-12-07 DIAGNOSIS — N179 Acute kidney failure, unspecified: Secondary | ICD-10-CM | POA: Diagnosis not present

## 2015-12-07 HISTORY — DX: Pleural effusion, not elsewhere classified: J90

## 2015-12-07 LAB — BLOOD GAS, VENOUS
Acid-Base Excess: 2.8 mmol/L (ref 0.0–3.0)
BICARBONATE: 28.5 meq/L — AB (ref 21.0–28.0)
O2 SAT: 62.9 %
PATIENT TEMPERATURE: 37
PH VEN: 7.39 (ref 7.320–7.430)
PO2 VEN: 33 mmHg (ref 31.0–45.0)
pCO2, Ven: 47 mmHg (ref 44.0–60.0)

## 2015-12-07 LAB — CBC
HEMATOCRIT: 41.8 % (ref 40.0–52.0)
HEMATOCRIT: 40.5 % (ref 40.0–52.0)
Hemoglobin: 12.6 g/dL — ABNORMAL LOW (ref 13.0–18.0)
Hemoglobin: 12.2 g/dL — ABNORMAL LOW (ref 13.0–18.0)
MCH: 21.9 pg — ABNORMAL LOW (ref 26.0–34.0)
MCH: 22.2 pg — AB (ref 26.0–34.0)
MCHC: 30.1 g/dL — AB (ref 32.0–36.0)
MCHC: 30.1 g/dL — AB (ref 32.0–36.0)
MCV: 72.9 fL — AB (ref 80.0–100.0)
MCV: 73.6 fL — AB (ref 80.0–100.0)
PLATELETS: 197 10*3/uL (ref 150–440)
Platelets: 244 10*3/uL (ref 150–440)
RBC: 5.73 MIL/uL (ref 4.40–5.90)
RBC: 5.51 MIL/uL (ref 4.40–5.90)
RDW: 21.5 % — AB (ref 11.5–14.5)
RDW: 21.7 % — AB (ref 11.5–14.5)
WBC: 20.1 10*3/uL — AB (ref 3.8–10.6)
WBC: 14.7 10*3/uL — AB (ref 3.8–10.6)

## 2015-12-07 LAB — BRAIN NATRIURETIC PEPTIDE

## 2015-12-07 LAB — INFLUENZA PANEL BY PCR (TYPE A & B)
H1N1FLUPCR: NOT DETECTED
Influenza A By PCR: NEGATIVE
Influenza B By PCR: NEGATIVE

## 2015-12-07 LAB — COMPREHENSIVE METABOLIC PANEL
ALK PHOS: 195 U/L — AB (ref 38–126)
ALT: 26 U/L (ref 17–63)
AST: 21 U/L (ref 15–41)
Albumin: 3.2 g/dL — ABNORMAL LOW (ref 3.5–5.0)
Anion gap: 7 (ref 5–15)
BILIRUBIN TOTAL: 1 mg/dL (ref 0.3–1.2)
BUN: 30 mg/dL — AB (ref 6–20)
CALCIUM: 8 mg/dL — AB (ref 8.9–10.3)
CO2: 26 mmol/L (ref 22–32)
Chloride: 101 mmol/L (ref 101–111)
Creatinine, Ser: 1.66 mg/dL — ABNORMAL HIGH (ref 0.61–1.24)
GFR calc Af Amer: 46 mL/min — ABNORMAL LOW (ref >60)
GFR, EST NON AFRICAN AMERICAN: 39 mL/min — AB (ref >60)
Glucose, Bld: 131 mg/dL — ABNORMAL HIGH (ref 65–99)
POTASSIUM: 3.5 mmol/L (ref 3.5–5.1)
Sodium: 134 mmol/L — ABNORMAL LOW (ref 135–145)
TOTAL PROTEIN: 6.5 g/dL (ref 6.5–8.1)

## 2015-12-07 LAB — RAPID INFLUENZA A&B ANTIGENS
Influenza A (ARMC): NEGATIVE
Influenza B (ARMC): NEGATIVE

## 2015-12-07 LAB — CREATININE, SERUM
Creatinine, Ser: 1.45 mg/dL — ABNORMAL HIGH (ref 0.61–1.24)
GFR calc Af Amer: 54 mL/min — ABNORMAL LOW (ref >60)
GFR calc non Af Amer: 46 mL/min — ABNORMAL LOW (ref >60)

## 2015-12-07 LAB — LACTIC ACID, PLASMA
LACTIC ACID, VENOUS: 1.4 mmol/L (ref 0.5–2.0)
Lactic Acid, Venous: 1.3 mmol/L (ref 0.5–2.0)

## 2015-12-07 LAB — TROPONIN I: Troponin I: 0.16 ng/mL — ABNORMAL HIGH (ref <0.031)

## 2015-12-07 MED ORDER — HYDROCODONE-ACETAMINOPHEN 10-325 MG PO TABS
1.0000 | ORAL_TABLET | Freq: Four times a day (QID) | ORAL | Status: DC | PRN
  Administered 2015-12-07 – 2015-12-09 (×7): 2 via ORAL
  Administered 2015-12-09: 1 via ORAL
  Administered 2015-12-09: 2 via ORAL
  Administered 2015-12-09: 1 via ORAL
  Administered 2015-12-10 (×2): 2 via ORAL
  Filled 2015-12-07 (×3): qty 2
  Filled 2015-12-07: qty 1
  Filled 2015-12-07 (×2): qty 2
  Filled 2015-12-07: qty 1
  Filled 2015-12-07 (×5): qty 2

## 2015-12-07 MED ORDER — HYDROMORPHONE HCL 1 MG/ML IJ SOLN
0.5000 mg | Freq: Once | INTRAMUSCULAR | Status: AC
  Administered 2015-12-07: 0.5 mg via INTRAVENOUS

## 2015-12-07 MED ORDER — ACETAMINOPHEN 325 MG PO TABS: 650.0000 mg | ORAL_TABLET | Freq: Four times a day (QID) | ORAL | Status: DC | PRN

## 2015-12-07 MED ORDER — FUROSEMIDE 10 MG/ML IJ SOLN
40.0000 mg | Freq: Two times a day (BID) | INTRAMUSCULAR | Status: DC
  Administered 2015-12-08: 40 mg via INTRAVENOUS
  Filled 2015-12-07: qty 4

## 2015-12-07 MED ORDER — ENOXAPARIN SODIUM 40 MG/0.4ML ~~LOC~~ SOLN
40.0000 mg | SUBCUTANEOUS | Status: DC
  Administered 2015-12-07 – 2015-12-09 (×3): 40 mg via SUBCUTANEOUS
  Filled 2015-12-07 (×3): qty 0.4

## 2015-12-07 MED ORDER — MOMETASONE FURO-FORMOTEROL FUM 100-5 MCG/ACT IN AERO
2.0000 | INHALATION_SPRAY | Freq: Two times a day (BID) | RESPIRATORY_TRACT | Status: DC
  Administered 2015-12-07 – 2015-12-10 (×6): 2 via RESPIRATORY_TRACT
  Filled 2015-12-07: qty 8.8

## 2015-12-07 MED ORDER — ONDANSETRON HCL 4 MG PO TABS: 4.0000 mg | ORAL_TABLET | Freq: Four times a day (QID) | ORAL | Status: DC | PRN

## 2015-12-07 MED ORDER — TIOTROPIUM BROMIDE MONOHYDRATE 18 MCG IN CAPS
18.0000 ug | ORAL_CAPSULE | Freq: Every day | RESPIRATORY_TRACT | Status: DC
  Administered 2015-12-08 – 2015-12-10 (×3): 18 ug via RESPIRATORY_TRACT
  Filled 2015-12-07: qty 5

## 2015-12-07 MED ORDER — DOCUSATE SODIUM 100 MG PO CAPS
100.0000 mg | ORAL_CAPSULE | Freq: Two times a day (BID) | ORAL | Status: DC
  Administered 2015-12-08 – 2015-12-09 (×5): 100 mg via ORAL
  Filled 2015-12-07 (×7): qty 1

## 2015-12-07 MED ORDER — SODIUM CHLORIDE 0.9% FLUSH: 3.0000 mL | INTRAVENOUS | Status: DC | PRN

## 2015-12-07 MED ORDER — LEVOFLOXACIN IN D5W 500 MG/100ML IV SOLN
500.0000 mg | INTRAVENOUS | Status: DC
Start: 2015-12-08 — End: 2015-12-08
  Administered 2015-12-08: 500 mg via INTRAVENOUS
  Filled 2015-12-07 (×2): qty 100

## 2015-12-07 MED ORDER — ACETAMINOPHEN 650 MG RE SUPP: 650.0000 mg | Freq: Four times a day (QID) | RECTAL | Status: DC | PRN

## 2015-12-07 MED ORDER — METHYLPREDNISOLONE SODIUM SUCC 125 MG IJ SOLR
60.0000 mg | Freq: Four times a day (QID) | INTRAMUSCULAR | Status: DC
  Administered 2015-12-07 – 2015-12-08 (×3): 60 mg via INTRAVENOUS
  Filled 2015-12-07 (×3): qty 2

## 2015-12-07 MED ORDER — HYDROMORPHONE HCL 1 MG/ML IJ SOLN
INTRAMUSCULAR | Status: AC
  Administered 2015-12-07: 0.5 mg via INTRAVENOUS
  Filled 2015-12-07: qty 1

## 2015-12-07 MED ORDER — LEVOFLOXACIN IN D5W 500 MG/100ML IV SOLN
500.0000 mg | INTRAVENOUS | Status: DC
  Filled 2015-12-07: qty 100

## 2015-12-07 MED ORDER — SODIUM CHLORIDE 0.9 % IV SOLN: 250.0000 mL | INTRAVENOUS | Status: DC | PRN

## 2015-12-07 MED ORDER — ASPIRIN EC 81 MG PO TBEC
81.0000 mg | DELAYED_RELEASE_TABLET | Freq: Every day | ORAL | Status: DC
  Administered 2015-12-07 – 2015-12-10 (×4): 81 mg via ORAL
  Filled 2015-12-07 (×4): qty 1

## 2015-12-07 MED ORDER — ONDANSETRON HCL 4 MG/2ML IJ SOLN: 4.0000 mg | Freq: Four times a day (QID) | INTRAMUSCULAR | Status: DC | PRN

## 2015-12-07 MED ORDER — NITROGLYCERIN 0.4 MG SL SUBL
0.4000 mg | SUBLINGUAL_TABLET | SUBLINGUAL | Status: DC | PRN
Start: 2015-12-07 — End: 2015-12-10

## 2015-12-07 MED ORDER — ALPRAZOLAM 0.25 MG PO TABS
0.2500 mg | ORAL_TABLET | Freq: Two times a day (BID) | ORAL | Status: DC | PRN
  Administered 2015-12-08 – 2015-12-09 (×2): 0.25 mg via ORAL
  Filled 2015-12-07 (×2): qty 1

## 2015-12-07 MED ORDER — CARVEDILOL 6.25 MG PO TABS
6.2500 mg | ORAL_TABLET | Freq: Two times a day (BID) | ORAL | Status: DC
  Administered 2015-12-07 – 2015-12-10 (×6): 6.25 mg via ORAL
  Filled 2015-12-07 (×6): qty 1

## 2015-12-07 MED ORDER — VANCOMYCIN HCL 10 G IV SOLR: 1500.0000 mg | Freq: Once | INTRAVENOUS | Status: DC

## 2015-12-07 MED ORDER — PANTOPRAZOLE SODIUM 40 MG PO TBEC
40.0000 mg | DELAYED_RELEASE_TABLET | Freq: Every day | ORAL | Status: DC
  Administered 2015-12-07 – 2015-12-10 (×4): 40 mg via ORAL
  Filled 2015-12-07 (×4): qty 1

## 2015-12-07 MED ORDER — ACETAMINOPHEN 500 MG PO TABS
1000.0000 mg | ORAL_TABLET | Freq: Four times a day (QID) | ORAL | Status: DC | PRN
Start: 2015-12-07 — End: 2015-12-10

## 2015-12-07 MED ORDER — PIPERACILLIN-TAZOBACTAM 3.375 G IVPB 30 MIN
3.3750 g | Freq: Once | INTRAVENOUS | Status: AC
  Administered 2015-12-07: 3.375 g via INTRAVENOUS
  Filled 2015-12-07: qty 50

## 2015-12-07 MED ORDER — SENNA 8.6 MG PO TABS
1.0000 | ORAL_TABLET | Freq: Two times a day (BID) | ORAL | Status: DC
  Administered 2015-12-08 – 2015-12-09 (×5): 8.6 mg via ORAL
  Filled 2015-12-07 (×7): qty 1

## 2015-12-07 MED ORDER — ADULT MULTIVITAMIN W/MINERALS CH
1.0000 | ORAL_TABLET | Freq: Every day | ORAL | Status: DC
  Administered 2015-12-07 – 2015-12-10 (×4): 1 via ORAL
  Filled 2015-12-07 (×4): qty 1

## 2015-12-07 MED ORDER — LEVOFLOXACIN IN D5W 500 MG/100ML IV SOLN
500.0000 mg | Freq: Once | INTRAVENOUS | Status: AC
  Administered 2015-12-07: 500 mg via INTRAVENOUS
  Filled 2015-12-07 (×2): qty 100

## 2015-12-07 MED ORDER — MORPHINE SULFATE (PF) 2 MG/ML IV SOLN
1.0000 mg | INTRAVENOUS | Status: DC | PRN
  Administered 2015-12-08 (×2): 1 mg via INTRAVENOUS
  Filled 2015-12-07 (×2): qty 1

## 2015-12-07 MED ORDER — IPRATROPIUM-ALBUTEROL 0.5-2.5 (3) MG/3ML IN SOLN
3.0000 mL | Freq: Four times a day (QID) | RESPIRATORY_TRACT | Status: DC
  Administered 2015-12-07 – 2015-12-10 (×11): 3 mL via RESPIRATORY_TRACT
  Filled 2015-12-07 (×13): qty 3

## 2015-12-07 MED ORDER — VANCOMYCIN HCL 10 G IV SOLR
1500.0000 mg | Freq: Once | INTRAVENOUS | Status: AC
  Administered 2015-12-07: 1500 mg via INTRAVENOUS
  Filled 2015-12-07: qty 1500

## 2015-12-07 MED ORDER — LEVOFLOXACIN IN D5W 500 MG/100ML IV SOLN: 500.0000 mg | INTRAVENOUS | Status: DC

## 2015-12-07 MED ORDER — SODIUM CHLORIDE 0.9% FLUSH
3.0000 mL | Freq: Two times a day (BID) | INTRAVENOUS | Status: DC
  Administered 2015-12-07 – 2015-12-10 (×6): 3 mL via INTRAVENOUS

## 2015-12-07 MED ORDER — FUROSEMIDE 10 MG/ML IJ SOLN
40.0000 mg | Freq: Once | INTRAMUSCULAR | Status: AC
  Administered 2015-12-07: 40 mg via INTRAVENOUS
  Filled 2015-12-07: qty 4

## 2015-12-07 NOTE — ED Notes (Signed)
Pt went to follow-up appointment today and his PCP recommended he come to ED to be evaluated for CHF exacerbation.  Pt is complaining of shortness of breath and bilateral lower extremity edema.  Pt has +3 pitting edema present in bilateral LE's.

## 2015-12-07 NOTE — ED Notes (Signed)
Pt to ultrasound

## 2015-12-07 NOTE — Progress Notes (Signed)
Pharmacy Antibiotic Note  Collin [name] is a 75 y.o. male admitted on 12/07/2015 with COPD, CHF, and pneumoniaPharmacy has been consulted for Levaquin dosing. Zosyn 3.375 g iv once given in ED.   Plan: Will begin Levaquin 500 mg iv q 24 hours starting 6 hours after Zosyn dose.   Pharmacy will continue to monitor and adjust per consult.   Height: 6\' 2"  (188 cm) Weight: 195 lb (88.451 kg) IBW/kg (Calculated) : 82.2  Temp (24hrs), Avg:97.5 F (36.4 C), Min:97.5 F (36.4 C), Max:97.5 F (36.4 C)   Recent Labs Lab 12/07/15 1137 12/07/15 1429 12/07/15 1717  WBC 20.1*  --  14.7*  CREATININE 1.66*  --  1.45*  LATICACIDVEN  --  1.3  --     Estimated Creatinine Clearance: 52.8 mL/min (by C-G formula based on Cr of 1.45).    Allergies  Allergen Reactions  . Lisinopril Anaphylaxis  . Metoprolol Other (See Comments)    Reaction:  Ringing in ears     Antimicrobials this admission: Zosyn 3/28 >> 3/28 vancomcyin 3/28 >> 3/28 Levaquin 3/28 >>  Dose adjustments this admission:   Microbiology results: 3/28 BCx: pending 3/28 UCx: pending   Thank you for allowing pharmacy to be a part of this patient's care.  Napoleon Form 12/07/2015 6:28 PM

## 2015-12-07 NOTE — H&P (Signed)
[name] NAME: Collin [name]    MR#:  [id number]  DATE OF BIRTH:  April 09, 1941  DATE OF ADMISSION:  12/07/2015  PRIMARY CARE PHYSICIAN: [name], MD   REQUESTING/REFERRING PHYSICIAN: Rutherford Guys M.D.  CHIEF COMPLAINT:   Chief Complaint  Patient presents with  . Shortness of Breath    HISTORY OF PRESENT ILLNESS: Collin [name]  is a 75 y.o. male with a known history of  COPD, chronic systolic CHF, history of pleural effusion with chronic Pleurx catheter as well as a right-sided nephrostomy tube was presenting with worsening shortness of breath. Patient was recently in the hospital and discharged 6 days ago. She was in the hospital for acute on chronic systolic CHF. He states that he started having shortness of breath since yesterday. His notices legs are more swollen. And his abdomen is more swollen. His been coughing and having some wheezing as well. No chest pains PAST MEDICAL HISTORY:   Past Medical History  Diagnosis Date  . COPD (chronic obstructive pulmonary disease) ([place])   . Chronic systolic CHF (congestive heart failure) ([place])   . Kidney stone   . Macular degeneration   . HLD (hyperlipidemia)   . Hypertensive retinopathy   . HTN (hypertension)   . GERD (gastroesophageal reflux disease)   . Peripheral vascular disease of lower extremity ([place])   . Pleural effusion     PAST SURGICAL HISTORY: Past Surgical History  Procedure Laterality Date  . Abdominal aortic aneurysm repair    . Hernia repair    . Kidney stone surgery    . Chest tube insertion Right 07/07/2015    Procedure: INSERTION PLEURAL DRAINAGE CATHETER;  Surgeon: Nestor Lewandowsky, MD;  Location: [facility] ORS;  Service: Thoracic;  Laterality: Right;  . Peripheral vascular catheterization N/A 11/03/2015    Procedure: Abdominal Aortogram w/Lower Extremity;  Surgeon: Algernon Huxley, MD;  Location: [place] [facility];  Service: Cardiovascular;  Laterality:  N/A;  . Peripheral vascular catheterization  11/03/2015    Procedure: Lower Extremity Intervention;  Surgeon: Algernon Huxley, MD;  Location: [place] [facility];  Service: Cardiovascular;;  . Peripheral vascular catheterization Left 11/10/2015    Procedure: Lower Extremity Angiography;  Surgeon: Algernon Huxley, MD;  Location: [place] [facility];  Service: Cardiovascular;  Laterality: Left;  . Peripheral vascular catheterization  11/10/2015    Procedure: Lower Extremity Intervention;  Surgeon: Algernon Huxley, MD;  Location: [place] [facility];  Service: Cardiovascular;;  . Peripheral vascular stent Left   . Nephrostomy tube placement ([facility] hx)      SOCIAL HISTORY:  Social History  Substance Use Topics  . Smoking status: Current Every Day Smoker -- 0.25 packs/day for 45 years    Types: Cigarettes  . Smokeless tobacco: Never Used  . Alcohol Use: No    FAMILY HISTORY:  Family History  Problem Relation Age of Onset  . Heart attack Mother   . Stroke Mother   . Cancer Father   . COPD Father   . Heart attack Father     DRUG ALLERGIES:  Allergies  Allergen Reactions  . Lisinopril Anaphylaxis  . Metoprolol Other (See Comments)    Reaction:  Ringing in ears     REVIEW OF SYSTEMS:   CONSTITUTIONAL: No fever, fatigue or weakness.  EYES: No blurred or double vision.  EARS, NOSE, AND THROAT: No tinnitus or ear pain.  RESPIRATORY: Positive cough, positive shortness of breath,  positive wheezing no hemoptysis.  CARDIOVASCULAR: No chest pain, orthopnea, edema.  GASTROINTESTINAL: No nausea, vomiting, diarrhea or abdominal pain.  GENITOURINARY: No dysuria, hematuria.  ENDOCRINE: No polyuria, nocturia,  HEMATOLOGY: No anemia, easy bruising or bleeding SKIN: No rash or lesion. MUSCULOSKELETAL: No joint pain or arthritis.   NEUROLOGIC: No tingling, numbness, weakness.  PSYCHIATRY: No anxiety or depression.   MEDICATIONS AT HOME:  Prior to Admission medications   Medication Sig Start Date End  Date Taking? Authorizing Provider  acetaminophen (TYLENOL) 500 MG tablet Take 1,000 mg by mouth every 6 (six) hours as needed for mild pain.   Yes Historical Provider, MD  albuterol (PROVENTIL HFA;VENTOLIN HFA) 108 (90 BASE) MCG/ACT inhaler Inhale 2 puffs into the lungs every 4 (four) hours as needed for wheezing or shortness of breath.    Yes Historical Provider, MD  ALPRAZolam (XANAX) 0.25 MG tablet Take 1 tablet (0.25 mg total) by mouth 2 (two) times daily as needed for anxiety or sleep. 10/29/15  Yes Srikar Sudini, MD  aspirin EC 81 MG tablet Take 81 mg by mouth daily.   Yes Historical Provider, MD  carvedilol (COREG) 6.25 MG tablet Take 6.25 mg by mouth 2 (two) times daily.    Yes Historical Provider, MD  Fluticasone-Salmeterol (ADVAIR) 100-50 MCG/DOSE AEPB Inhale 1 puff into the lungs 2 (two) times daily.   Yes Historical Provider, MD  furosemide (LASIX) 40 MG tablet Take 1 tablet (40 mg total) by mouth 2 (two) times daily. 12/01/15  Yes Fritzi Mandes, MD  HYDROcodone-acetaminophen (NORCO) 7.5-325 MG tablet Take 1 tablet by mouth every 4 (four) hours as needed for moderate pain. 12/01/15  Yes Fritzi Mandes, MD  Multiple Vitamin (MULTIVITAMIN WITH MINERALS) TABS tablet Take 1 tablet by mouth daily.   Yes Historical Provider, MD  nitroGLYCERIN (NITROSTAT) 0.4 MG SL tablet Take 0.4 mg by mouth every 5 (five) minutes x 3 doses as needed for chest pain.    Yes Historical Provider, MD  omeprazole (PRILOSEC) 20 MG capsule Take 20 mg by mouth 2 (two) times daily.    Yes Historical Provider, MD  oxyCODONE-acetaminophen (PERCOCET/ROXICET) 5-325 MG tablet Take 1-2 tablets by mouth every 6 (six) hours as needed for severe pain.   Yes Historical Provider, MD  tiotropium (SPIRIVA HANDIHALER) 18 MCG inhalation capsule Place 1 capsule (18 mcg total) into inhaler and inhale daily. 03/06/15  Yes Srikar Sudini, MD      PHYSICAL EXAMINATION:   VITAL SIGNS: Blood pressure 135/92, pulse 106, temperature 97.5 F (36.4 C),  temperature source Oral, resp. rate 25, height 6\' 2"  (1.88 m), weight 88.451 kg (195 lb), SpO2 100 %.  GENERAL:  75 y.o.-year-old patient lying in the bed with no acute distress.  EYES: Pupils equal, round, reactive to light and accommodation. No scleral icterus. Extraocular muscles intact.  HEENT: Head atraumatic, normocephalic. Oropharynx and nasopharynx clear.  NECK:  Supple, no jugular venous distention. No thyroid enlargement, no tenderness.  LUNGS: Bilateral wheezing throughout both lungs as well as occasional rhonchi CARDIOVASCULAR: S1, S2 normal. No murmurs, rubs, or gallops.  ABDOMEN: Soft, nontender, nondistended. Bowel sounds present. No organomegaly or mass.  EXTREMITIES: Positive pedal edema, cyanosis, or clubbing.  Left small toe is necrotic NEUROLOGIC: Cranial nerves II through XII are intact. Muscle strength 5/5 in all extremities. Sensation intact. Gait not checked.  PSYCHIATRIC: The patient is alert and oriented x 3.  SKIN: No obvious rash, lesion, or ulcer.   LABORATORY PANEL:   CBC  Recent Labs Lab 12/07/15  1137  WBC 20.1*  HGB 12.6*  HCT 41.8  PLT 244  MCV 72.9*  MCH 21.9*  MCHC 30.1*  RDW 21.5*   ------------------------------------------------------------------------------------------------------------------  Chemistries   Recent Labs Lab 12/07/15 1137  NA 134*  K 3.5  CL 101  CO2 26  GLUCOSE 131*  BUN 30*  CREATININE 1.66*  CALCIUM 8.0*  AST 21  ALT 26  ALKPHOS 195*  BILITOT 1.0   ------------------------------------------------------------------------------------------------------------------ estimated creatinine clearance is 46.1 mL/min (by C-G formula based on Cr of 1.66). ------------------------------------------------------------------------------------------------------------------ No results for input(s): TSH, T4TOTAL, T3FREE, THYROIDAB in the last 72 hours.  Invalid input(s): FREET3   Coagulation profile No results for  input(s): INR, PROTIME in the last 168 hours. ------------------------------------------------------------------------------------------------------------------- No results for input(s): DDIMER in the last 72 hours. -------------------------------------------------------------------------------------------------------------------  Cardiac Enzymes  Recent Labs Lab 12/07/15 1137  TROPONINI 0.16*   ------------------------------------------------------------------------------------------------------------------ Invalid input(s): POCBNP  ---------------------------------------------------------------------------------------------------------------  Urinalysis    Component Value Date/Time   COLORURINE YELLOW* 11/29/2015 2037   COLORURINE Yellow 08/15/2014 0745   APPEARANCEUR TURBID* 11/29/2015 2037   APPEARANCEUR Clear 08/15/2014 0745   LABSPEC 1.020 11/29/2015 2037   LABSPEC 1.015 08/15/2014 0745   PHURINE 5.0 11/29/2015 2037   PHURINE 6.0 08/15/2014 0745   GLUCOSEU NEGATIVE 11/29/2015 2037   GLUCOSEU 50 mg/dL 08/15/2014 0745   HGBUR 2+* 11/29/2015 2037   HGBUR 2+ 08/15/2014 0745   BILIRUBINUR NEGATIVE 11/29/2015 2037   BILIRUBINUR Negative 08/15/2014 0745   KETONESUR NEGATIVE 11/29/2015 2037   KETONESUR Negative 08/15/2014 0745   PROTEINUR 100* 11/29/2015 2037   PROTEINUR 100 mg/dL 08/15/2014 0745   NITRITE NEGATIVE 11/29/2015 2037   NITRITE Negative 08/15/2014 0745   LEUKOCYTESUR 3+* 11/29/2015 2037   LEUKOCYTESUR Trace 08/15/2014 0745     RADIOLOGY: Dg Chest 2 View  12/07/2015  CLINICAL DATA:  Shortness of breath for 3 days EXAM: CHEST  2 VIEW COMPARISON:  11/29/2015 FINDINGS: Cardiac shadow is again enlarged. Bilateral pleural effusions are again seen. Right PleurX catheter is noted and stable. Mild bibasilar infiltrative changes are noted slightly worse on the right than the left. These changes are new from the prior exam. IMPRESSION: Stable bilateral pleural effusions  and right PleurX catheter. New bibasilar infiltrative changes worse on the right than the left. Electronically Signed   By: Inez Catalina M.D.   On: 12/07/2015 12:25   US Venous Img Lower Bilateral  12/07/2015  CLINICAL DATA:  Bilateral lower extremity edema EXAM: BILATERAL LOWER EXTREMITY VENOUS DUPLEX ULTRASOUND TECHNIQUE: Gray-scale sonography with graded compression, as well as color Doppler and duplex ultrasound were performed to evaluate the lower extremity deep venous systems from the level of the common femoral vein and including the common femoral, femoral, profunda femoral, popliteal and calf veins including the posterior tibial, peroneal and gastrocnemius veins when visible. The superficial great saphenous vein was also interrogated. Spectral Doppler was utilized to evaluate flow at rest and with distal augmentation maneuvers in the common femoral, femoral and popliteal veins. COMPARISON:  None. FINDINGS: RIGHT LOWER EXTREMITY Common Femoral Vein: No evidence of thrombus. Normal compressibility, respiratory phasicity and response to augmentation. Saphenofemoral Junction: No evidence of thrombus. Normal compressibility and flow on color Doppler imaging. Profunda Femoral Vein: No evidence of thrombus. Normal compressibility and flow on color Doppler imaging. Femoral Vein: No evidence of thrombus. Normal compressibility, respiratory phasicity and response to augmentation. Popliteal Vein: No evidence of thrombus. Normal compressibility, respiratory phasicity and response to augmentation. Calf Veins: No evidence of thrombus. Normal compressibility and flow  on color Doppler imaging. Superficial Great Saphenous Vein: No evidence of thrombus. Normal compressibility and flow on color Doppler imaging. Venous Reflux:  None. Other Findings:  None. LEFT LOWER EXTREMITY Common Femoral Vein: No evidence of thrombus. Normal compressibility, respiratory phasicity and response to augmentation. Saphenofemoral Junction: No  evidence of thrombus. Normal compressibility and flow on color Doppler imaging. Profunda Femoral Vein: No evidence of thrombus. Normal compressibility and flow on color Doppler imaging. Femoral Vein: No evidence of thrombus. Normal compressibility, respiratory phasicity and response to augmentation. Popliteal Vein: No evidence of thrombus. Normal compressibility, respiratory phasicity and response to augmentation. Calf Veins: No evidence of thrombus. Normal compressibility and flow on color Doppler imaging. Superficial Great Saphenous Vein: No evidence of thrombus. Normal compressibility and flow on color Doppler imaging. Venous Reflux:  None. Other Findings:  None. IMPRESSION: No evidence of deep venous thrombosis in either lower extremity. Electronically Signed   By: Lowella Grip III M.D.   On: 12/07/2015 16:02    EKG: Orders placed or performed during the hospital encounter of 12/07/15  . ED EKG  . ED EKG    IMPRESSION AND PLAN: Patient is a 75 year old white male with chronic respiratory failure presents with shortness of breath noted to have leukocytosis  1. Acute on chronic respiratory failure due to acute on chronic COPD exasperation as well as acute systolic CHF as well as pneumonia. Increase oxygen  2. Acute on chronic COPD exasperation: Will place patient on nebulizers place him on IV Solu-Medrol as well as continue his inhalers  3. Acute on chronic systolic CHF will changes oral Lasix to IV Lasix  4. GERD continue omeprazole  5. Generalized anxiety disorder continue alprazolam  6. Chronic pleural effusion continued drainage per Pleurx catheter  7. Chronic nephrostomy tube local care  8. Small left toe necrosis and patient may need this amputated last podiatry to see   e discussed with ED provider. Management plans discussed with the patient, family and they are in agreement.  CODE STATUS: Code Status History    Date Active Date Inactive Code Status Order ID Comments  User Context   11/29/2015  8:27 PM 12/01/2015  4:15 PM Full Code [id number]  Sylvan Cheese, MD ED   10/26/2015 10:08 PM 10/29/2015  5:25 PM Full Code [id number]  Samson Frederic, DO Inpatient   08/22/2015  5:44 PM 08/31/2015  4:39 PM Full Code [id number]  Theodoro Grist, MD Inpatient   07/07/2015  1:57 PM 07/08/2015  2:28 PM Full Code [id number]  Nestor Lewandowsky, MD Inpatient   06/12/2015  3:54 PM 06/14/2015  5:01 PM Full Code [id number]  Baxter Hire, MD Inpatient   03/20/2015 12:20 AM 03/21/2015  9:25 PM Full Code [id number]  Lance Coon, MD ED   03/04/2015  5:52 AM 03/06/2015  2:26 PM Full Code [id number]  Lance Coon, MD Inpatient       TOTAL TIME TAKING CARE OF THIS PATIENT: 55 minutes.    Dustin Flock M.D on 12/07/2015 at 4:30 PM  Between 7am to 6pm - Pager - [phone]  After 6pm go to [email or web address] - password [email or web address] [facility]  Office  [phone]  CC: Primary care physician; [name], MD

## 2015-12-07 NOTE — ED Notes (Addendum)
Pt reports shortness of breath x3 days; pt was d/c from hospital on Wednesday. Also reports swelling in abdomen, legs and feet. Pt is on 2L home oxygen. Pt also reports pain to left pinky toe x1 month. Sent here by [facility] for CHF exacerbation and gangrenous left 5th toe.

## 2015-12-07 NOTE — Consult Note (Signed)
ORTHOPAEDIC CONSULTATION  REQUESTING PHYSICIAN: Dustin Flock, MD  Chief Complaint: gangrene left 5th toe  HPI: Collin [name] is a 75 y.o. male who complains of  Gangrenous left 5th toe.  Underwent recent lower extr. revasc by Dr. Lucky Cowboy.  Has f/u in April.  5th toe has been stable for several weeks.  Currently admitted with COPD and CHF.  Past Medical History  Diagnosis Date  . COPD (chronic obstructive pulmonary disease) ([place])   . Chronic systolic CHF (congestive heart failure) ([place])   . Kidney stone   . Macular degeneration   . HLD (hyperlipidemia)   . Hypertensive retinopathy   . HTN (hypertension)   . GERD (gastroesophageal reflux disease)   . Peripheral vascular disease of lower extremity ([place])   . Pleural effusion    Past Surgical History  Procedure Laterality Date  . Abdominal aortic aneurysm repair    . Hernia repair    . Kidney stone surgery    . Chest tube insertion Right 07/07/2015    Procedure: INSERTION PLEURAL DRAINAGE CATHETER;  Surgeon: Nestor Lewandowsky, MD;  Location: [facility] ORS;  Service: Thoracic;  Laterality: Right;  . Peripheral vascular catheterization N/A 11/03/2015    Procedure: Abdominal Aortogram w/Lower Extremity;  Surgeon: Algernon Huxley, MD;  Location: [place] [facility];  Service: Cardiovascular;  Laterality: N/A;  . Peripheral vascular catheterization  11/03/2015    Procedure: Lower Extremity Intervention;  Surgeon: Algernon Huxley, MD;  Location: [place] [facility];  Service: Cardiovascular;;  . Peripheral vascular catheterization Left 11/10/2015    Procedure: Lower Extremity Angiography;  Surgeon: Algernon Huxley, MD;  Location: [place] [facility];  Service: Cardiovascular;  Laterality: Left;  . Peripheral vascular catheterization  11/10/2015    Procedure: Lower Extremity Intervention;  Surgeon: Algernon Huxley, MD;  Location: [place] [facility];  Service: Cardiovascular;;  . Peripheral vascular stent Left   . Nephrostomy tube placement ([facility] hx)     Social  History   Social History  . Marital Status: Married    Spouse Name: N/A  . Number of Children: N/A  . Years of Education: N/A   Social History Main Topics  . Smoking status: Current Every Day Smoker -- 0.25 packs/day for 45 years    Types: Cigarettes  . Smokeless tobacco: Never Used  . Alcohol Use: No  . Drug Use: No  . Sexual Activity: No   Other Topics Concern  . None   Social History Narrative   Family History  Problem Relation Age of Onset  . Heart attack Mother   . Stroke Mother   . Cancer Father   . COPD Father   . Heart attack Father    Allergies  Allergen Reactions  . Lisinopril Anaphylaxis  . Metoprolol Other (See Comments)    Reaction:  Ringing in ears    Prior to Admission medications   Medication Sig Start Date End Date Taking? Authorizing Provider  acetaminophen (TYLENOL) 500 MG tablet Take 1,000 mg by mouth every 6 (six) hours as needed for mild pain.   Yes Historical Provider, MD  albuterol (PROVENTIL HFA;VENTOLIN HFA) 108 (90 BASE) MCG/ACT inhaler Inhale 2 puffs into the lungs every 4 (four) hours as needed for wheezing or shortness of breath.    Yes Historical Provider, MD  ALPRAZolam (XANAX) 0.25 MG tablet Take 1 tablet (0.25 mg total) by mouth 2 (two) times daily as needed for anxiety or sleep. 10/29/15  Yes Hillary Bow, MD  aspirin EC 81  MG tablet Take 81 mg by mouth daily.   Yes Historical Provider, MD  carvedilol (COREG) 6.25 MG tablet Take 6.25 mg by mouth 2 (two) times daily.    Yes Historical Provider, MD  Fluticasone-Salmeterol (ADVAIR) 100-50 MCG/DOSE AEPB Inhale 1 puff into the lungs 2 (two) times daily.   Yes Historical Provider, MD  furosemide (LASIX) 40 MG tablet Take 1 tablet (40 mg total) by mouth 2 (two) times daily. 12/01/15  Yes Fritzi Mandes, MD  HYDROcodone-acetaminophen (NORCO) 7.5-325 MG tablet Take 1 tablet by mouth every 4 (four) hours as needed for moderate pain. 12/01/15  Yes Fritzi Mandes, MD  Multiple Vitamin (MULTIVITAMIN WITH  MINERALS) TABS tablet Take 1 tablet by mouth daily.   Yes Historical Provider, MD  nitroGLYCERIN (NITROSTAT) 0.4 MG SL tablet Take 0.4 mg by mouth every 5 (five) minutes x 3 doses as needed for chest pain.    Yes Historical Provider, MD  omeprazole (PRILOSEC) 20 MG capsule Take 20 mg by mouth 2 (two) times daily.    Yes Historical Provider, MD  oxyCODONE-acetaminophen (PERCOCET/ROXICET) 5-325 MG tablet Take 1-2 tablets by mouth every 6 (six) hours as needed for severe pain.   Yes Historical Provider, MD  tiotropium (SPIRIVA HANDIHALER) 18 MCG inhalation capsule Place 1 capsule (18 mcg total) into inhaler and inhale daily. 03/06/15  Yes Hillary Bow, MD   Dg Chest 2 View  12/07/2015  CLINICAL DATA:  Shortness of breath for 3 days EXAM: CHEST  2 VIEW COMPARISON:  11/29/2015 FINDINGS: Cardiac shadow is again enlarged. Bilateral pleural effusions are again seen. Right PleurX catheter is noted and stable. Mild bibasilar infiltrative changes are noted slightly worse on the right than the left. These changes are new from the prior exam. IMPRESSION: Stable bilateral pleural effusions and right PleurX catheter. New bibasilar infiltrative changes worse on the right than the left. Electronically Signed   By: Inez Catalina M.D.   On: 12/07/2015 12:25   US Venous Img Lower Bilateral  12/07/2015  CLINICAL DATA:  Bilateral lower extremity edema EXAM: BILATERAL LOWER EXTREMITY VENOUS DUPLEX ULTRASOUND TECHNIQUE: Gray-scale sonography with graded compression, as well as color Doppler and duplex ultrasound were performed to evaluate the lower extremity deep venous systems from the level of the common femoral vein and including the common femoral, femoral, profunda femoral, popliteal and calf veins including the posterior tibial, peroneal and gastrocnemius veins when visible. The superficial great saphenous vein was also interrogated. Spectral Doppler was utilized to evaluate flow at rest and with distal augmentation  maneuvers in the common femoral, femoral and popliteal veins. COMPARISON:  None. FINDINGS: RIGHT LOWER EXTREMITY Common Femoral Vein: No evidence of thrombus. Normal compressibility, respiratory phasicity and response to augmentation. Saphenofemoral Junction: No evidence of thrombus. Normal compressibility and flow on color Doppler imaging. Profunda Femoral Vein: No evidence of thrombus. Normal compressibility and flow on color Doppler imaging. Femoral Vein: No evidence of thrombus. Normal compressibility, respiratory phasicity and response to augmentation. Popliteal Vein: No evidence of thrombus. Normal compressibility, respiratory phasicity and response to augmentation. Calf Veins: No evidence of thrombus. Normal compressibility and flow on color Doppler imaging. Superficial Great Saphenous Vein: No evidence of thrombus. Normal compressibility and flow on color Doppler imaging. Venous Reflux:  None. Other Findings:  None. LEFT LOWER EXTREMITY Common Femoral Vein: No evidence of thrombus. Normal compressibility, respiratory phasicity and response to augmentation. Saphenofemoral Junction: No evidence of thrombus. Normal compressibility and flow on color Doppler imaging. Profunda Femoral Vein: No evidence of thrombus. Normal  compressibility and flow on color Doppler imaging. Femoral Vein: No evidence of thrombus. Normal compressibility, respiratory phasicity and response to augmentation. Popliteal Vein: No evidence of thrombus. Normal compressibility, respiratory phasicity and response to augmentation. Calf Veins: No evidence of thrombus. Normal compressibility and flow on color Doppler imaging. Superficial Great Saphenous Vein: No evidence of thrombus. Normal compressibility and flow on color Doppler imaging. Venous Reflux:  None. Other Findings:  None. IMPRESSION: No evidence of deep venous thrombosis in either lower extremity. Electronically Signed   By: Lowella Grip III M.D.   On: 12/07/2015 16:02     Positive ROS: All other systems have been reviewed and were otherwise negative with the exception of those mentioned in the HPI and as above.  12 point ROS was performed.  Physical Exam: General: Alert and oriented.  No apparent distress.  Vascular:  Left foot:Dorsalis Pedis:  absent Posterior Tibial:  absent  Right foot: Dorsalis Pedis:  absent Posterior Tibial:  absent  Neuro:intact sensation  Derm:Dry gangrene to left 5th toe.  No infection.   Ortho/MS: Very painful to 5th toe.  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   Do not recommend any acute intervention until vascular status has been re-established and pt is scheduled to see vascular outpt. Must keep this clean and protected from trauma. If toe become infected may necessitate amputation.      Elesa Hacker, DPM Cell [phone]   12/07/2015 6:20 PM

## 2015-12-07 NOTE — ED Provider Notes (Signed)
[facility] Emergency Department Provider Note  ____________________________________________  Time seen: Approximately 1:58 PM  I have reviewed the triage vital signs and the nursing notes.   HISTORY  Chief Complaint Shortness of Breath    HPI Collin [name] is a 75 y.o. male with a history of COPD on 2 L nasal cannula at home, recurrent pleural effusion with indwelling pleural catheter, renal stones with right-sided nephrostomy tube,chronic systolic CHF presenting with cough and shortness of breath. Patient was recently admitted to the hospital and discharged 6 days ago. Since then, he reports progressively worsening lower extremity edema and pain, abdominal edema, productive cough, and shortness of breath requiring increased exogenous oxygen. He has also had an associated sore throat with some congestion. No ear pain. No nausea vomiting or diarrhea. No known fever or chills. Patient has a indwelling right pleural catheter which is draining daily and has put out [id number] mL daily without any change. He has not noted any change in the output from his nephrostomy bag.   Past Medical History  Diagnosis Date  . COPD (chronic obstructive pulmonary disease) ([place])   . Chronic systolic CHF (congestive heart failure) ([place])   . Kidney stone   . Macular degeneration   . HLD (hyperlipidemia)   . Hypertensive retinopathy   . HTN (hypertension)   . GERD (gastroesophageal reflux disease)   . Peripheral vascular disease of lower extremity [facility])     Patient Active Problem List   Diagnosis Date Noted  . Lumbar canal stenosis 12/03/2015  . Acute exacerbation of CHF (congestive heart failure) ([place]) 11/29/2015  . Acute on chronic respiratory failure with hypoxia ([place]) 11/29/2015  . COPD exacerbation ([place]) 11/29/2015  . Elevated troponin 11/29/2015  . Tobacco use 11/29/2015  . Pneumonia 10/26/2015  . Sepsis ([place]) 10/26/2015  . Chronic respiratory failure with hypoxia ([place])  10/26/2015  . Cellulitis of left foot 10/26/2015  . Gangrenous toe ([place]) 10/26/2015  . ARF (acute renal failure) ([place])   . Hydronephrosis   . UTI (lower urinary tract infection)   . Hydronephrosis with urinary obstruction due to renal calculus   . Acute congestive heart failure ([place]) 08/22/2015  . 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 Acute on chronic respiratory failure ([place]) 06/12/2015  . Acute on chronic systolic CHF (congestive heart failure) ([place]) 03/19/2015  . Open wound of left heel 03/19/2015  . Open wnd of foot 03/19/2015  . Acute on chronic systolic heart failure ([place]) 03/19/2015  . Recurrent right pleural effusion 03/04/2015  . Acute respiratory failure with hypoxemia ([place]) 03/04/2015  . COPD (chronic obstructive pulmonary disease) ([place]) 03/04/2015  . HLD (hyperlipidemia) 03/04/2015  . Chronic systolic CHF (congestive heart failure) ([place]) 03/04/2015  . HTN (hypertension) 03/04/2015  . GERD (gastroesophageal reflux disease) 03/04/2015  . Pleural cavity effusion 03/04/2015  . Gastro-esophageal reflux disease without esophagitis 03/04/2015  . Chronic obstructive pulmonary disease ([place]) 03/04/2015  . Compulsive tobacco user syndrome 01/07/2015  . Lumbar radiculopathy 06/12/2014  . Neuritis or radiculitis due to rupture of lumbar intervertebral disc 06/12/2014  . Calculi, urethra 12/16/2013  . Genitourinary complaints 12/09/2013  . Calculus of kidney 12/09/2013  . Lower urinary tract symptoms 12/09/2013  . Benign prostatic hyperplasia with urinary obstruction 12/09/2013  . BP (high blood pressure) 11/24/2013  . Essential (primary) hypertension 11/24/2013  . Back pain, thoracic 04/04/2013  . Back pain, chronic 04/04/2013  . Age-related macular degeneration 03/11/2012  . 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 Cataract, nuclear sclerotic  senile 11/10/2011  . Nonexudative age-related macular degeneration 11/10/2011  . Hypertensive retinopathy 11/10/2011  . 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 Cataract cortical, senile 11/10/2011  . Loss of teeth due to gum disease 05/22/2011  . Complete loss of teeth due to periodontal disease 05/22/2011    Past Surgical History  Procedure Laterality Date  . Abdominal aortic aneurysm repair    . Hernia repair    . Kidney stone surgery    . Chest tube insertion Right 07/07/2015    Procedure: INSERTION PLEURAL DRAINAGE CATHETER;  Surgeon: Nestor Lewandowsky, MD;  Location: [facility] ORS;  Service: Thoracic;  Laterality: Right;  . Peripheral vascular catheterization N/A 11/03/2015    Procedure: Abdominal Aortogram w/Lower Extremity;  Surgeon: Algernon Huxley, MD;  Location: [place] [facility];  Service: Cardiovascular;  Laterality: N/A;  . Peripheral vascular catheterization  11/03/2015    Procedure: Lower Extremity Intervention;  Surgeon: Algernon Huxley, MD;  Location: [place] [facility];  Service: Cardiovascular;;  . Peripheral vascular catheterization Left 11/10/2015    Procedure: Lower Extremity Angiography;  Surgeon: Algernon Huxley, MD;  Location: [place] [facility];  Service: Cardiovascular;  Laterality: Left;  . Peripheral vascular catheterization  11/10/2015    Procedure: Lower Extremity Intervention;  Surgeon: Algernon Huxley, MD;  Location: [place] [facility];  Service: Cardiovascular;;  . Peripheral vascular stent Left     Current Outpatient Rx  Name  Route  Sig  Dispense  Refill  . acetaminophen (TYLENOL) 500 MG tablet   Oral   Take 1,000 mg by mouth every 6 (six) hours as needed for mild pain.         Marland Kitchen albuterol (PROVENTIL HFA;VENTOLIN HFA) 108 (90 BASE) MCG/ACT inhaler   Inhalation   Inhale 2 puffs into the lungs every 4 (four) hours as needed for wheezing or shortness of breath.          . ALPRAZolam (XANAX) 0.25 MG tablet   Oral   Take 1 tablet (0.25 mg total) by mouth 2 (two) times daily as needed for anxiety or sleep.   30 tablet   0   . aspirin EC 81 MG tablet   Oral    Take 81 mg by mouth daily.         . carvedilol (COREG) 6.25 MG tablet   Oral   Take 6.25 mg by mouth 2 (two) times daily.          . Fluticasone-Salmeterol (ADVAIR) 100-50 MCG/DOSE AEPB   Inhalation   Inhale 1 puff into the lungs 2 (two) times daily.         . furosemide (LASIX) 40 MG tablet   Oral   Take 1 tablet (40 mg total) by mouth 2 (two) times daily.   60 tablet   3   . HYDROcodone-acetaminophen (NORCO) 7.5-325 MG tablet   Oral   Take 1 tablet by mouth every 4 (four) hours as needed for moderate pain.   30 tablet   0   . Multiple Vitamin (MULTIVITAMIN WITH MINERALS) TABS tablet   Oral   Take 1 tablet by mouth daily.         . nitroGLYCERIN (NITROSTAT) 0.4 MG SL tablet   Oral   Take 0.4 mg by mouth every 5 (five) minutes x 3 doses as needed for chest pain.          Marland Kitchen omeprazole (PRILOSEC) 20 MG capsule   Oral   Take 20  mg by mouth 2 (two) times daily.          Marland Kitchen oxyCODONE-acetaminophen (PERCOCET/ROXICET) 5-325 MG tablet   Oral   Take 1-2 tablets by mouth every 6 (six) hours as needed for severe pain.         Marland Kitchen tiotropium (SPIRIVA HANDIHALER) 18 MCG inhalation capsule   Inhalation   Place 1 capsule (18 mcg total) into inhaler and inhale daily.   30 capsule   0     Allergies Lisinopril and Metoprolol  Family History  Problem Relation Age of Onset  . Heart attack Mother   . Stroke Mother   . Cancer Father   . COPD Father   . Heart attack Father     Social History Social History  Substance Use Topics  . Smoking status: Current Every Day Smoker -- 0.25 packs/day for 45 years    Types: Cigarettes  . Smokeless tobacco: Never Used  . Alcohol Use: No    Review of Systems Constitutional: No fever/chills.No lightheadedness or syncope. Positive decreased exercise tolerance. Eyes: No visual changes. No eye discharge. ENT: Positive sore throat. Positive congestion or rhinorrhea. Cardiovascular: Denies chest pain. Denies palpitations. No  change in output from his indwelling pleural catheter. Respiratory: Positive exertional shortness of breath.  Positive productive cough. Gastrointestinal: No abdominal pain.  No nausea, no vomiting.  No diarrhea.  No constipation. Genitourinary: No change in the urine output from his right nephrostomy tube. Musculoskeletal: Positive for back pain around the nephrostomy tube. Skin: Negative for rash. Neurological: Negative for headaches. No focal numbness, tingling or weakness.   10-point ROS otherwise negative.  ____________________________________________   PHYSICAL EXAM:  VITAL SIGNS: ED Triage Vitals  Enc Vitals Group     BP 12/07/15 1123 125/84 mmHg     Pulse Rate 12/07/15 1123 70     Resp 12/07/15 1123 20     Temp 12/07/15 1123 97.5 F (36.4 C)     Temp Source 12/07/15 1123 Oral     SpO2 12/07/15 1123 91 %     Weight 12/07/15 1123 195 lb (88.451 kg)     Height 12/07/15 1123 6\' 2"  (1.88 m)     Head Cir --      Peak Flow --      Pain Score 12/07/15 1123 9     Pain Loc --      Pain Edu? --      Excl. in [place]? --     Constitutional: Patient is alert and oriented and answering questions appropriately. He is chronically ill-appearing with some mild shortness of breath but able to speak in 4-5 word sentences.  Eyes: Conjunctivae are normal.  EOMI. No scleral icterus. No eye discharge. Head: Atraumatic. Nose: Positive congestion without rhinorrhea. Mouth/Throat: Mucous membranes are moist. No posterior pharyngeal erythema, tonsillar swelling or exudate. Posterior palate is symmetric and uvula is midline. Neck: No stridor.  Supple.  Positive JVD. Cardiovascular: Fast rate, regular rhythm. No murmurs, rubs or gallops.  Respiratory: Patient is tachypnea and speaking in 5 word sentences. He has some mild accessory muscle use and no retractions. He has effused wheezing bilaterally with some rales in the bases bilaterally. The patient has a right pleural indwelling catheter without any  surrounding erythema, drainage or tenderness to palpation. Gastrointestinal: Soft, nontender and nondistended.  No guarding or rebound.  No peritoneal signs. Back: Right nephrostomy tube in place which is without erythema, swelling or purulent discharge. Urine output is yellow and clear. Musculoskeletal: Positive bilateral left  greater than right lower extremity swelling. The left small toe is blackened and discolored and there is some erythema over the left foot but no warmth. Normal DP and PT pulses bilaterally.  Neurologic:  A&Ox3.  Speech is clear.  Face and smile are symmetric.  EOMI.  Moves all extremities well. Skin:  Skin is warm, dry. Psychiatric: Mood and affect are normal. Speech and behavior are normal.  Normal judgement.  ____________________________________________   LABS (all labs ordered are listed, but only abnormal results are displayed)  Labs Reviewed  CBC - Abnormal; Notable for the following:    WBC 20.1 (*)    Hemoglobin 12.6 (*)    MCV 72.9 (*)    MCH 21.9 (*)    MCHC 30.1 (*)    RDW 21.5 (*)    All other components within normal limits  COMPREHENSIVE METABOLIC PANEL - Abnormal; Notable for the following:    Sodium 134 (*)    Glucose, Bld 131 (*)    BUN 30 (*)    Creatinine, Ser 1.66 (*)    Calcium 8.0 (*)    Albumin 3.2 (*)    Alkaline Phosphatase 195 (*)    GFR calc non Af Amer 39 (*)    GFR calc Af Amer 46 (*)    All other components within normal limits  BRAIN NATRIURETIC PEPTIDE - Abnormal; Notable for the following:    B Natriuretic Peptide >4500.0 (*)    All other components within normal limits  TROPONIN I - Abnormal; Notable for the following:    Troponin I 0.16 (*)    All other components within normal limits  BLOOD GAS, VENOUS - Abnormal; Notable for the following:    Bicarbonate 28.5 (*)    All other components within normal limits  CULTURE, BLOOD (ROUTINE X 2)  CULTURE, BLOOD (ROUTINE X 2)  URINE CULTURE  RAPID INFLUENZA A&B ANTIGENS  ([facility] ONLY)  LACTIC ACID, PLASMA  LACTIC ACID, PLASMA   ____________________________________________  EKG  ED ECG REPORT I, Eula Listen, the attending physician, personally viewed and interpreted this ECG.   Date: 12/07/2015  EKG Time: 1137  Rate: 104  Rhythm: sinus tachycardia  Axis: Normal  Intervals:none  ST&T Change: No ST elevation. Positive nonspecific T-wave inversion in V1.  ____________________________________________  RADIOLOGY  Dg Chest 2 View  12/07/2015  CLINICAL DATA:  Shortness of breath for 3 days EXAM: CHEST  2 VIEW COMPARISON:  11/29/2015 FINDINGS: Cardiac shadow is again enlarged. Bilateral pleural effusions are again seen. Right PleurX catheter is noted and stable. Mild bibasilar infiltrative changes are noted slightly worse on the right than the left. These changes are new from the prior exam. IMPRESSION: Stable bilateral pleural effusions and right PleurX catheter. New bibasilar infiltrative changes worse on the right than the left. Electronically Signed   By: Inez Catalina M.D.   On: 12/07/2015 12:25    ____________________________________________   PROCEDURES  Procedure(s) performed: None  Critical Care performed: No ____________________________________________   INITIAL IMPRESSION / ASSESSMENT AND PLAN / ED COURSE  Pertinent labs & imaging results that were available during my care of the patient were reviewed by me and considered in my medical decision making (see chart for details).  75 y.o. L with multiple comorbidities and recent hospitalization presenting with cough, sore throat, peripheral edema. I am concerned about multiple possible sources of infection in this patient, including pulmonary in the lungs around the catheter, renal including around the nephrostomy tube, bacteremia. In addition, I'm concerned about  fluid overload given his significant edema in the abdomen and lower extremities as well as cough and shortness of breath  without fever. It is possible that he has acute congestive heart failure. His fluid status will be difficult due to infection versus congestive heart failure so we will start by diuresis which I think will help his symptoms the most. If he starts to have any instability in his vital signs, he will need fluid resuscitation. Patient will need admission to the hospital  ----------------------------------------- 3:17 PM on 12/07/2015 -----------------------------------------  The patient continues to be hemodynamically stable and oxygenating well at 3 L nasal cannula. His chest x-ray does show increased fluid, but could this be infection as well? He does have a BNP which is greater than 4500 so congestive heart failure is a great possibility. His troponin is 0.16 and the patient has not had any direct chest pain, so ACS or MI is possible but this also could be cardiac strain from his pulmonary disease. He does have an elevated white blood cell count of 20.1.  At this time, the patient has been treated with antibiotics and increased oxygen for pneumonia, diuretics for congestive heart failure.  Plan admission.  ____________________________________________  FINAL CLINICAL IMPRESSION(S) / ED DIAGNOSES  Final diagnoses:  Healthcare-associated pneumonia  NSTEMI (non-ST elevated myocardial infarction) ([place])  Acute on chronic systolic congestive heart failure ([place])      NEW MEDICATIONS STARTED DURING THIS VISIT:  New Prescriptions   No medications on file     Eula Listen, MD 12/07/15 [phone]

## 2015-12-08 LAB — CBC
HCT: 36.5 % — ABNORMAL LOW (ref 40.0–52.0)
HEMOGLOBIN: 11.2 g/dL — AB (ref 13.0–18.0)
MCH: 22.9 pg — AB (ref 26.0–34.0)
MCHC: 30.8 g/dL — ABNORMAL LOW (ref 32.0–36.0)
MCV: 74.4 fL — ABNORMAL LOW (ref 80.0–100.0)
Platelets: 188 10*3/uL (ref 150–440)
RBC: 4.91 MIL/uL (ref 4.40–5.90)
RDW: 22.4 % — ABNORMAL HIGH (ref 11.5–14.5)
WBC: 9.6 10*3/uL (ref 3.8–10.6)

## 2015-12-08 LAB — BASIC METABOLIC PANEL
Anion gap: 9 (ref 5–15)
BUN: 32 mg/dL — AB (ref 6–20)
CHLORIDE: 98 mmol/L — AB (ref 101–111)
CO2: 23 mmol/L (ref 22–32)
Calcium: 7.5 mg/dL — ABNORMAL LOW (ref 8.9–10.3)
Creatinine, Ser: 1.77 mg/dL — ABNORMAL HIGH (ref 0.61–1.24)
GFR calc non Af Amer: 36 mL/min — ABNORMAL LOW (ref >60)
GFR, EST AFRICAN AMERICAN: 42 mL/min — AB (ref >60)
Glucose, Bld: 293 mg/dL — ABNORMAL HIGH (ref 65–99)
POTASSIUM: 3.8 mmol/L (ref 3.5–5.1)
SODIUM: 130 mmol/L — AB (ref 135–145)

## 2015-12-08 LAB — GLUCOSE, CAPILLARY: GLUCOSE-CAPILLARY: 334 mg/dL — AB (ref 65–99)

## 2015-12-08 MED ORDER — METHYLPREDNISOLONE SODIUM SUCC 125 MG IJ SOLR
60.0000 mg | Freq: Two times a day (BID) | INTRAMUSCULAR | Status: DC
  Administered 2015-12-08 – 2015-12-09 (×3): 60 mg via INTRAVENOUS
  Filled 2015-12-08 (×3): qty 2

## 2015-12-08 MED ORDER — INSULIN ASPART 100 UNIT/ML ~~LOC~~ SOLN
0.0000 [IU] | Freq: Three times a day (TID) | SUBCUTANEOUS | Status: DC
  Administered 2015-12-08: 7 [IU] via SUBCUTANEOUS
  Administered 2015-12-09: 3 [IU] via SUBCUTANEOUS
  Administered 2015-12-09: 9 [IU] via SUBCUTANEOUS
  Administered 2015-12-09: 3 [IU] via SUBCUTANEOUS
  Administered 2015-12-10 (×2): 2 [IU] via SUBCUTANEOUS
  Filled 2015-12-08: qty 3
  Filled 2015-12-08: qty 2
  Filled 2015-12-08: qty 7
  Filled 2015-12-08: qty 9
  Filled 2015-12-08: qty 3
  Filled 2015-12-08: qty 2

## 2015-12-08 MED ORDER — FUROSEMIDE 10 MG/ML IJ SOLN
60.0000 mg | Freq: Two times a day (BID) | INTRAMUSCULAR | Status: DC
  Administered 2015-12-08 – 2015-12-10 (×4): 60 mg via INTRAVENOUS
  Filled 2015-12-08 (×4): qty 6

## 2015-12-08 MED ORDER — LEVOFLOXACIN IN D5W 250 MG/50ML IV SOLN
250.0000 mg | INTRAVENOUS | Status: DC
  Administered 2015-12-09: 250 mg via INTRAVENOUS
  Filled 2015-12-08: qty 50

## 2015-12-08 NOTE — Progress Notes (Signed)
Pharmacy Antibiotic Note  Collin [name] is a 75 y.o. male admitted on 12/07/2015 with COPD, CHF, and pneumoniaPharmacy has been consulted for Levaquin dosing. Zosyn 3.375 g iv once given in ED.   Plan: Current orders for levaquin 500 IV Q24H. Will adjust to levaquin 250mg  IV Q24H for renal function.   Pharmacy will continue to monitor and adjust per consult.   Height: 6\' 2"  (188 cm) Weight: 195 lb (88.451 kg) IBW/kg (Calculated) : 82.2  Temp (24hrs), Avg:98.3 F (36.8 C), Min:97.8 F (36.6 C), Max:98.8 F (37.1 C)   Recent Labs Lab 12/07/15 1137 12/07/15 1429 12/07/15 1717 12/08/15 0447  WBC 20.1*  --  14.7* 9.6  CREATININE 1.66*  --  1.45* 1.77*  LATICACIDVEN  --  1.3 1.4  --     Estimated Creatinine Clearance: 43.2 mL/min (by C-G formula based on Cr of 1.77).    Allergies  Allergen Reactions  . Lisinopril Anaphylaxis  . Metoprolol Other (See Comments)    Reaction:  Ringing in ears     Antimicrobials this admission: Zosyn 3/28 >> 3/28 vancomcyin 3/28 >> 3/28 Levaquin 3/28 >>  Dose adjustments this admission:   Microbiology results: 3/28 BCx: NGTD 3/28 UCx: NGTD  Thank you for allowing pharmacy to be a part of this patient's care.  Collin [name] C 12/08/2015 12:37 PM

## 2015-12-08 NOTE — Care Management Note (Signed)
Case Management Note  Patient Details  Name: MAJESTIC SPESSARD MRN: [id number] Date of Birth: 08-10-1941  Subjective/Objective:    Patient active with [facility] for [facility] program. As noted by CM  on previous visit, patient will only allow the agency to back 2x week visits. TC to [employer] with [facility]. Waiting for return call as to what services they are providing. Will follow progression. .                 Action/Plan:   Expected Discharge Date:                  Expected Discharge Plan:  [place]  In-House Referral:     Discharge planning Services  CM Consult  Post Acute Care Choice:  Home Health, Resumption of Svcs/PTA Provider Choice offered to:     DME Arranged:    DME Agency:     HH Arranged:    HH Agency:  [facility]  Status of Service:  In process, will continue to follow  Medicare Important Message Given:    Date Medicare IM Given:    Medicare IM give by:    Date Additional Medicare IM Given:    Additional Medicare Important Message give by:     If discussed at [place] of Stay Meetings, dates discussed:    Additional Comments:  Jolly Mango, RN 12/08/2015, 12:10 PM

## 2015-12-08 NOTE — Care Management (Signed)
Spoke with [facility]. The agency attempted to open case 2-3 times but patient refused start of care. Patient wanted to go to hospital for symptom management instead. Abby with [facility] plans to come to the hospital to speak further with patient to see what his discharge goals and post acute care are.

## 2015-12-08 NOTE — Progress Notes (Signed)
Inpatient Diabetes Program Recommendations  AACE/ADA: New Consensus Statement on Inpatient Glycemic Control (2015)  Target Ranges:  Prepandial:   less than 140 mg/dL      Peak postprandial:   less than 180 mg/dL (1-2 hours)      Critically ill patients:  140 - 180 mg/dL   Results for NADIM, ANDRETTA (MRN [id number]) as of 12/08/2015 09:43  Ref. Range 12/07/2015 11:37 12/08/2015 04:47  Glucose Latest Ref Range: 65-99 mg/dL 131 (H) 293 (H)    Admit with: SOB  History: CHF, COPD     MD- Note patient is currently receiving IV Solumedrol 60 mg bid.  Lab glucose elevated to 293 mg/dl this AM.  If this trend continues, please consider starting Novolog Sensitive Correction Scale/ SSI (0-9 units) TID AC + HS    --Will follow patient during hospitalization--  Wyn Quaker RN, MSN, CDE Diabetes Coordinator Inpatient Glycemic Control Team Team Pager: [phone] (8a-5p)

## 2015-12-08 NOTE — Progress Notes (Signed)
Initial appointment is already scheduled at the [facility] for December 29, 2015 at 10:30am. Thank you.

## 2015-12-08 NOTE — Progress Notes (Signed)
Patient ID: Collin [name], male   DOB: 25-Jan-1941, 75 y.o.   MRN: [id number] [facility] NAME: Collin [name]    MR#:  [id number]  DATE OF BIRTH:  07-Jan-1941  SUBJECTIVE:   Presented with worsening shortness of breath and lower extremity edema. Lower extremity swelling is slowly improving. Shortness of breath improved.  REVIEW OF SYSTEMS:   Review of Systems  Constitutional: Negative for fever, chills and weight loss.  HENT: Negative for ear discharge, ear pain and nosebleeds.   Eyes: Negative for blurred vision, pain and discharge.  Respiratory: Positive for cough, shortness of breath and wheezing. Negative for sputum production and stridor.   Cardiovascular: Negative for chest pain, palpitations, orthopnea and PND.  Gastrointestinal: Negative for nausea, vomiting, abdominal pain and diarrhea.  Genitourinary: Negative for urgency and frequency.  Musculoskeletal: Positive for back pain. Negative for joint pain.  Neurological: Positive for weakness. Negative for sensory change, speech change and focal weakness.  Psychiatric/Behavioral: Negative for depression and hallucinations. The patient is not nervous/anxious.   All other systems reviewed and are negative.  Tolerating diet: yes  DRUG ALLERGIES:   Allergies  Allergen Reactions  . Lisinopril Anaphylaxis  . Metoprolol Other (See Comments)    Reaction:  Ringing in ears     VITALS:  Blood pressure 139/89, pulse 97, temperature 98.2 F (36.8 C), temperature source Oral, resp. rate 18, height 6\' 2"  (1.88 m), weight 88.451 kg (195 lb), SpO2 98 %.  PHYSICAL EXAMINATION:  GENERAL:  75 y.o.-year-old patient lying in the bed with no acute distress. Chronically ill EYES: Pupils equal, round, reactive to light and accommodation. No scleral icterus. Extraocular muscles intact.  HEENT: Head atraumatic, normocephalic. Oropharynx and nasopharynx clear.  NECK:  Supple, no jugular  venous distention. No thyroid enlargement, no tenderness.  LUNGS: Coarse breath sounds bilaterally,rales,rhonchi or crepitation. No use of accessory muscles of respiration. pleurx catheter. Bilateral wheezing CARDIOVASCULAR: S1, S2 normal. No murmurs, rubs, or gallops.  ABDOMEN: Soft, nontender, nondistended. Bowel sounds present. No organomegaly or mass. Right side nephrostomy tube EXTREMITIES: chronic pedal edema, no cyanosis, or clubbing.  NEUROLOGIC: Cranial nerves II through XII are intact. Muscle strength 4/5 in all extremities. Sensation intact. Gait not checked.  PSYCHIATRIC: patient is alert and oriented x 3.  SKIN: Gangrenous left fifth toe  LABORATORY PANEL:   CBC  Recent Labs Lab 12/07/15 1717 12/08/15 0447  WBC 14.7* 9.6  HGB 12.2* 11.2*  HCT 40.5 36.5*  PLT 197 188   ------------------------------------------------------------------------------------------------------------------ Chemistries   Recent Labs Lab 12/07/15 1137 12/07/15 1717 12/08/15 0447  NA 134*  --  130*  K 3.5  --  3.8  CL 101  --  98*  CO2 26  --  23  GLUCOSE 131*  --  293*  BUN 30*  --  32*  CREATININE 1.66* 1.45* 1.77*  CALCIUM 8.0*  --  7.5*  AST 21  --   --   ALT 26  --   --   ALKPHOS 195*  --   --   BILITOT 1.0  --   --    ------------------------------------------------------------------------------------------------------------------  Cardiac Enzymes  Recent Labs Lab 12/07/15 1137  TROPONINI 0.16*   ------------------------------------------------------------------------------------------------------------------  RADIOLOGY:  Dg Chest 2 View  12/07/2015  CLINICAL DATA:  Shortness of breath for 3 days EXAM: CHEST  2 VIEW COMPARISON:  11/29/2015 FINDINGS: Cardiac shadow is again enlarged. Bilateral pleural effusions are again seen. Right PleurX catheter is  noted and stable. Mild bibasilar infiltrative changes are noted slightly worse on the right than the left. These changes  are new from the prior exam. IMPRESSION: Stable bilateral pleural effusions and right PleurX catheter. New bibasilar infiltrative changes worse on the right than the left. Electronically Signed   By: Inez Catalina M.D.   On: 12/07/2015 12:25   US Venous Img Lower Bilateral  12/07/2015  CLINICAL DATA:  Bilateral lower extremity edema EXAM: BILATERAL LOWER EXTREMITY VENOUS DUPLEX ULTRASOUND TECHNIQUE: Gray-scale sonography with graded compression, as well as color Doppler and duplex ultrasound were performed to evaluate the lower extremity deep venous systems from the level of the common femoral vein and including the common femoral, femoral, profunda femoral, popliteal and calf veins including the posterior tibial, peroneal and gastrocnemius veins when visible. The superficial great saphenous vein was also interrogated. Spectral Doppler was utilized to evaluate flow at rest and with distal augmentation maneuvers in the common femoral, femoral and popliteal veins. COMPARISON:  None. FINDINGS: RIGHT LOWER EXTREMITY Common Femoral Vein: No evidence of thrombus. Normal compressibility, respiratory phasicity and response to augmentation. Saphenofemoral Junction: No evidence of thrombus. Normal compressibility and flow on color Doppler imaging. Profunda Femoral Vein: No evidence of thrombus. Normal compressibility and flow on color Doppler imaging. Femoral Vein: No evidence of thrombus. Normal compressibility, respiratory phasicity and response to augmentation. Popliteal Vein: No evidence of thrombus. Normal compressibility, respiratory phasicity and response to augmentation. Calf Veins: No evidence of thrombus. Normal compressibility and flow on color Doppler imaging. Superficial Great Saphenous Vein: No evidence of thrombus. Normal compressibility and flow on color Doppler imaging. Venous Reflux:  None. Other Findings:  None. LEFT LOWER EXTREMITY Common Femoral Vein: No evidence of thrombus. Normal compressibility,  respiratory phasicity and response to augmentation. Saphenofemoral Junction: No evidence of thrombus. Normal compressibility and flow on color Doppler imaging. Profunda Femoral Vein: No evidence of thrombus. Normal compressibility and flow on color Doppler imaging. Femoral Vein: No evidence of thrombus. Normal compressibility, respiratory phasicity and response to augmentation. Popliteal Vein: No evidence of thrombus. Normal compressibility, respiratory phasicity and response to augmentation. Calf Veins: No evidence of thrombus. Normal compressibility and flow on color Doppler imaging. Superficial Great Saphenous Vein: No evidence of thrombus. Normal compressibility and flow on color Doppler imaging. Venous Reflux:  None. Other Findings:  None. IMPRESSION: No evidence of deep venous thrombosis in either lower extremity. Electronically Signed   By: Lowella Grip III M.D.   On: 12/07/2015 16:02    ASSESSMENT AND PLAN:  Collin [name] is a 75 y.o. male with a known history of 75 year old Caucasian male with a past medical history significant for CHF with EF of 26%, COPD, need for home oxygen, previous history of kidney stone, chronic kidney disease, hypertension, hyperlipidemia and peripheral vascular disease. He presents to ED today due to a two-day history of worsening shortness of breath. Patient developed shortness of breath about 2 days ago and has gradually worsened over the last 2 days  1). Acute CHF exacerbation with elevated troponin - ejection fraction 25% Will increase dose of IV Lasix today. - Continue carvedilol - Troponin is mildly elevated at 0.05.  - Measure daily weights, strict intake and output as well as fluid restriction. - Continue oxygen supplementation and DuoNeb's.  2). COPD exacerbation -IV steroids, Antibiotics - Scheduled Nebulizers - Inhalers -Wean O2 as tolerated - Consult pulmonary if no improvement  3). Acute or chronic respiratory failure Secondary to acute CHF  exacerbation and COPD exacerbation. Continue management  as stated above.  4). Recurrent pleural effusion -  chronic and will continue to drain chest tube(pleurx)  5). Tobacco abuse Counseled to quit smoking  6) nephrolithiasis s/p chronic nephrostomy tube placement -pt follows with Dr Erlene Quan as out pt  All the records are reviewed and case discussed with Care Management/Social Workerr. Management plans discussed with the patient, family and they are in agreement.  CODE STATUS: full  TOTAL TIME TAKING CARE OF THIS PATIENT: 30 minutes  POSSIBLE D/C IN 1-2 DEPENDING ON CLINICAL CONDITION.   Collin [name] R M.D on 12/08/2015 at 3:25 PM  Between 7am to 6pm - Pager - [phone]  After 6pm go to [email or web address] - password [email or web address] [facility]  Office  [phone]  CC: Primary care physician; [name], MD

## 2015-12-08 NOTE — Care Management (Signed)
Spoke with patient very firmly regarding not allowing agency to open to  services.  Spoke with Abby to inquire about [place] [facility] status since patient was never open to care.  She will check with her administrator to determine if referral should go to another agency if needs [facility].   Will have patient assess for ltac criteria due to frequent readmission.  Requesting palliative care consult for frequent admission. goals of treatment and sx management.

## 2015-12-09 LAB — BASIC METABOLIC PANEL
Anion gap: 8 (ref 5–15)
BUN: 42 mg/dL — AB (ref 6–20)
CHLORIDE: 97 mmol/L — AB (ref 101–111)
CO2: 25 mmol/L (ref 22–32)
Calcium: 8.4 mg/dL — ABNORMAL LOW (ref 8.9–10.3)
Creatinine, Ser: 1.59 mg/dL — ABNORMAL HIGH (ref 0.61–1.24)
GFR calc Af Amer: 48 mL/min — ABNORMAL LOW (ref >60)
GFR calc non Af Amer: 41 mL/min — ABNORMAL LOW (ref >60)
GLUCOSE: 316 mg/dL — AB (ref 65–99)
POTASSIUM: 4 mmol/L (ref 3.5–5.1)
Sodium: 130 mmol/L — ABNORMAL LOW (ref 135–145)

## 2015-12-09 LAB — URINE CULTURE: CULTURE: NO GROWTH

## 2015-12-09 LAB — GLUCOSE, CAPILLARY
GLUCOSE-CAPILLARY: 243 mg/dL — AB (ref 65–99)
GLUCOSE-CAPILLARY: 264 mg/dL — AB (ref 65–99)
Glucose-Capillary: 250 mg/dL — ABNORMAL HIGH (ref 65–99)
Glucose-Capillary: 366 mg/dL — ABNORMAL HIGH (ref 65–99)

## 2015-12-09 LAB — HEMOGLOBIN A1C: HEMOGLOBIN A1C: 6.7 % — AB (ref 4.0–6.0)

## 2015-12-09 MED ORDER — LEVOFLOXACIN 500 MG PO TABS
250.0000 mg | ORAL_TABLET | Freq: Every day | ORAL | Status: DC
  Administered 2015-12-10: 250 mg via ORAL
  Filled 2015-12-09: qty 1

## 2015-12-09 MED ORDER — METHYLPREDNISOLONE SODIUM SUCC 125 MG IJ SOLR
60.0000 mg | Freq: Every day | INTRAMUSCULAR | Status: DC
  Administered 2015-12-10: 60 mg via INTRAVENOUS
  Filled 2015-12-09: qty 2

## 2015-12-09 NOTE — Care Management (Signed)
Patient would meet criteria for ltac but patient is refusing.  He then agrees to speak with representative.  Notified representative. Says he does not need "people coming into my house as soon as I get home and not letting me rest. Discussed rationale for home health intervention and patient declines to discuss other than say "I am going to pray on this."   Wife verbalizes to patient that he  should "listen" and verbalizes she wishes he would consider these options

## 2015-12-09 NOTE — Progress Notes (Signed)
Inpatient Diabetes Program Recommendations  AACE/ADA: New Consensus Statement on Inpatient Glycemic Control (2015)  Target Ranges:  Prepandial:   less than 140 mg/dL      Peak postprandial:   less than 180 mg/dL (1-2 hours)      Critically ill patients:  140 - 180 mg/dL   Review of Glycemic Control  Results for Collin [name], Collin [name] (MRN [id number]) as of 12/09/2015 09:03  Ref. Range 12/08/2015 16:28 12/09/2015 07:58  Glucose-Capillary Latest Ref Range: 65-99 mg/dL 334 (H) 250 (H)   Admit with: SOB  History: CHF, COPD   MD- Note patient is currently receiving IV Solumedrol 60 mg bid. Lab glucose elevated to 250 mg/dl this AM.   Outpatient Diabetes medications: none Current orders for Inpatient glycemic control: Novolog 0-9 units tid  Inpatient Diabetes Program Recommendations: Please consider ordering an A1C and begin Lantus 9 units qday (0.1unit/kg).   Consider adding Novolog 3 units tid with meals while the patient is on steroids.  Gentry Fitz, RN, BA, MHA, CDE Diabetes Coordinator Inpatient Diabetes Program  [phone] (Team Pager) [phone] ([place]) 12/09/2015 10:02 AM'

## 2015-12-09 NOTE — Progress Notes (Signed)
CONCERNING: Antibiotic IV to Oral Route Change Policy  RECOMMENDATION: This patient is receiving levofloxacin by the intravenous route.  Based on criteria approved by the Pharmacy and Therapeutics Committee, the antibiotic(s) is/are being converted to the equivalent oral dose form(s).   DESCRIPTION: These criteria include:  Patient being treated for a respiratory tract infection, urinary tract infection, cellulitis or clostridium difficile associated diarrhea if on metronidazole  The patient is not neutropenic and does not exhibit a GI malabsorption state  The patient is eating (either orally or via tube) and/or has been taking other orally administered medications for a least 24 hours  The patient is improving clinically and has a Tmax < 100.5  If you have questions about this conversion, please contact the [place], PharmD Clinical Pharmacist 12/09/2015 1:11 PM

## 2015-12-09 NOTE — Progress Notes (Signed)
Patient ID: SAGAN MAIONE, male   DOB: 06/01/1941, 75 y.o.   MRN: [id number] [facility] NAME: Oswaldo Sailor    MR#:  [id number]  DATE OF BIRTH:  Feb 08, 1941  SUBJECTIVE:   Presented with worsening shortness of breath and lower extremity edema. Lower extremity swelling is slowly improving. Shortness of breath improved.  REVIEW OF SYSTEMS:   Review of Systems  Constitutional: Negative for fever, chills and weight loss.  HENT: Negative for ear discharge, ear pain and nosebleeds.   Eyes: Negative for blurred vision, pain and discharge.  Respiratory: Positive for cough, shortness of breath and wheezing. Negative for sputum production and stridor.   Cardiovascular: Negative for chest pain, palpitations, orthopnea and PND.  Gastrointestinal: Negative for nausea, vomiting, abdominal pain and diarrhea.  Genitourinary: Negative for urgency and frequency.  Musculoskeletal: Positive for back pain. Negative for joint pain.  Neurological: Positive for weakness. Negative for sensory change, speech change and focal weakness.  Psychiatric/Behavioral: Negative for depression and hallucinations. The patient is not nervous/anxious.   All other systems reviewed and are negative.  Tolerating diet: yes  DRUG ALLERGIES:   Allergies  Allergen Reactions  . Lisinopril Anaphylaxis  . Metoprolol Other (See Comments)    Reaction:  Ringing in ears     VITALS:  Blood pressure 130/75, pulse 95, temperature 97.8 F (36.6 C), temperature source Oral, resp. rate 18, height 6\' 2"  (1.88 m), weight 88.451 kg (195 lb), SpO2 93 %.  PHYSICAL EXAMINATION:  GENERAL:  75 y.o.-year-old patient lying in the bed with no acute distress. Chronically ill EYES: Pupils equal, round, reactive to light and accommodation. No scleral icterus. Extraocular muscles intact.  HEENT: Head atraumatic, normocephalic. Oropharynx and nasopharynx clear.  NECK:  Supple, no jugular  venous distention. No thyroid enlargement, no tenderness.  LUNGS: Mild expiratory wheezing.. No use of accessory muscles of respiration. pleurx catheter. CARDIOVASCULAR: S1, S2 normal. No murmurs, rubs, or gallops.  ABDOMEN: Soft, nontender, nondistended. Bowel sounds present. No organomegaly or mass. Right side nephrostomy tube EXTREMITIES: chronic pedal edema, no cyanosis, or clubbing.  NEUROLOGIC: Cranial nerves II through XII are intact. Muscle strength 4/5 in all extremities. Sensation intact. Gait not checked.  PSYCHIATRIC: patient is alert and oriented x 3.  SKIN: Gangrenous left fifth toe  LABORATORY PANEL:   CBC  Recent Labs Lab 12/07/15 1717 12/08/15 0447  WBC 14.7* 9.6  HGB 12.2* 11.2*  HCT 40.5 36.5*  PLT 197 188   ------------------------------------------------------------------------------------------------------------------ Chemistries   Recent Labs Lab 12/07/15 1137  12/08/15 0447 12/09/15 0427  NA 134*  --  130* 130*  K 3.5  --  3.8 4.0  CL 101  --  98* 97*  CO2 26  --  23 25  GLUCOSE 131*  --  293* 316*  BUN 30*  --  32* 42*  CREATININE 1.66*  < > 1.77* 1.59*  CALCIUM 8.0*  --  7.5* 8.4*  AST 21  --   --   --   ALT 26  --   --   --   ALKPHOS 195*  --   --   --   BILITOT 1.0  --   --   --   < > = values in this interval not displayed. ------------------------------------------------------------------------------------------------------------------  Cardiac Enzymes  Recent Labs Lab 12/07/15 1137  TROPONINI 0.16*   ------------------------------------------------------------------------------------------------------------------  RADIOLOGY:  US Venous Img Lower Bilateral  12/07/2015  CLINICAL DATA:  Bilateral lower extremity  edema EXAM: BILATERAL LOWER EXTREMITY VENOUS DUPLEX ULTRASOUND TECHNIQUE: Gray-scale sonography with graded compression, as well as color Doppler and duplex ultrasound were performed to evaluate the lower extremity deep  venous systems from the level of the common femoral vein and including the common femoral, femoral, profunda femoral, popliteal and calf veins including the posterior tibial, peroneal and gastrocnemius veins when visible. The superficial great saphenous vein was also interrogated. Spectral Doppler was utilized to evaluate flow at rest and with distal augmentation maneuvers in the common femoral, femoral and popliteal veins. COMPARISON:  None. FINDINGS: RIGHT LOWER EXTREMITY Common Femoral Vein: No evidence of thrombus. Normal compressibility, respiratory phasicity and response to augmentation. Saphenofemoral Junction: No evidence of thrombus. Normal compressibility and flow on color Doppler imaging. Profunda Femoral Vein: No evidence of thrombus. Normal compressibility and flow on color Doppler imaging. Femoral Vein: No evidence of thrombus. Normal compressibility, respiratory phasicity and response to augmentation. Popliteal Vein: No evidence of thrombus. Normal compressibility, respiratory phasicity and response to augmentation. Calf Veins: No evidence of thrombus. Normal compressibility and flow on color Doppler imaging. Superficial Great Saphenous Vein: No evidence of thrombus. Normal compressibility and flow on color Doppler imaging. Venous Reflux:  None. Other Findings:  None. LEFT LOWER EXTREMITY Common Femoral Vein: No evidence of thrombus. Normal compressibility, respiratory phasicity and response to augmentation. Saphenofemoral Junction: No evidence of thrombus. Normal compressibility and flow on color Doppler imaging. Profunda Femoral Vein: No evidence of thrombus. Normal compressibility and flow on color Doppler imaging. Femoral Vein: No evidence of thrombus. Normal compressibility, respiratory phasicity and response to augmentation. Popliteal Vein: No evidence of thrombus. Normal compressibility, respiratory phasicity and response to augmentation. Calf Veins: No evidence of thrombus. Normal  compressibility and flow on color Doppler imaging. Superficial Great Saphenous Vein: No evidence of thrombus. Normal compressibility and flow on color Doppler imaging. Venous Reflux:  None. Other Findings:  None. IMPRESSION: No evidence of deep venous thrombosis in either lower extremity. Electronically Signed   By: Lowella Grip III M.D.   On: 12/07/2015 16:02    ASSESSMENT AND PLAN:  Jasten Duckwall is a 75 y.o. male with a known history of 75 year old Caucasian male with a past medical history significant for CHF with EF of 26%, COPD, need for home oxygen, previous history of kidney stone, chronic kidney disease, hypertension, hyperlipidemia and peripheral vascular disease. He presents to ED today due to a two-day history of worsening shortness of breath. Patient developed shortness of breath about 2 days ago and has gradually worsened over the last 2 days  1). Acute CHF exacerbation with elevated troponin - ejection fraction 25% Increased dose of IV Lasix. Improve diuresis. - Continue carvedilol - Troponin is mildly elevated, chronic - Measure daily weights, strict intake and output as well as fluid restriction. - Continue oxygen supplementation and DuoNeb's. - Exacerbation due to dietary noncompliance  2). COPD exacerbation -IV steroids, Antibiotics. Decrease steroid dose - Scheduled Nebulizers - Inhalers -Wean O2 as tolerated  3). Acute or chronic respiratory failure Secondary to acute CHF exacerbation and COPD exacerbation. Continue management as stated above.  4). Recurrent pleural effusion -  chronic and will continue to drain chest tube(pleurx)  5). Tobacco abuse Counseled to quit smoking  6) nephrolithiasis s/p chronic nephrostomy tube placement -pt follows with Dr Erlene Quan as out pt  5). Hyperglycemia No history of diabetes. Check HbA1c. Likely due to steroids. Decrease dose of steroids today. Continue sliding scale and monitor blood sugars  All the records are reviewed  and  case discussed with Care Management/Social Workerr. Management plans discussed with the patient, family and they are in agreement.  CODE STATUS: full  TOTAL TIME TAKING CARE OF THIS PATIENT: 30 minutes  POSSIBLE D/C IN 1-2 DEPENDING ON CLINICAL CONDITION.   Hillary Bow R M.D on 12/09/2015 at 12:36 PM  Between 7am to 6pm - Pager - [phone]  After 6pm go to [email or web address] - password [email or web address] [facility]  Office  [phone]  CC: Primary care physician; [name], MD

## 2015-12-09 NOTE — Plan of Care (Signed)
Problem: Safety: Goal: Ability to remain free from injury will improve Outcome: Progressing Fall precautions in place  Problem: Pain Managment: Goal: General experience of comfort will improve Outcome: Not Progressing Frequent pain medications  Problem: Physical Regulation: Goal: Will remain free from infection Outcome: Not Progressing IV antibiotics   Problem: Tissue Perfusion: Goal: Risk factors for ineffective tissue perfusion will decrease Outcome: Progressing SQ Lovenox

## 2015-12-10 LAB — GLUCOSE, CAPILLARY
GLUCOSE-CAPILLARY: 179 mg/dL — AB (ref 65–99)
Glucose-Capillary: 199 mg/dL — ABNORMAL HIGH (ref 65–99)

## 2015-12-10 MED ORDER — FUROSEMIDE 80 MG PO TABS: 80.0000 mg | ORAL_TABLET | Freq: Two times a day (BID) | ORAL | Status: DC

## 2015-12-10 MED ORDER — ALPRAZOLAM 0.25 MG PO TABS: 0.2500 mg | ORAL_TABLET | Freq: Two times a day (BID) | ORAL | Status: DC | PRN

## 2015-12-10 MED ORDER — HYDROCODONE-ACETAMINOPHEN 10-325 MG PO TABS: 1.0000 | ORAL_TABLET | Freq: Three times a day (TID) | ORAL | Status: DC | PRN

## 2015-12-10 MED ORDER — FUROSEMIDE 10 MG/ML IJ SOLN
80.0000 mg | Freq: Once | INTRAMUSCULAR | Status: AC
  Administered 2015-12-10: 80 mg via INTRAVENOUS
  Filled 2015-12-10: qty 8

## 2015-12-10 NOTE — Plan of Care (Signed)
Problem: Safety: Goal: Ability to remain free from injury will improve Outcome: Progressing Fall precautions in place  Problem: Pain Managment: Goal: General experience of comfort will improve Outcome: Progressing PO pain meds  Problem: Physical Regulation: Goal: Will remain free from infection Outcome: Progressing PO antibiotics  Problem: Tissue Perfusion: Goal: Risk factors for ineffective tissue perfusion will decrease Outcome: Progressing SQ Lovenox

## 2015-12-10 NOTE — Discharge Instructions (Signed)
Heart Failure Clinic appointment on December 29, 2015 at 10:30am with [name], [place]. Please call [phone] to reschedule.    DIET:  Cardiac diet  DISCHARGE CONDITION:  Stable  ACTIVITY:  Activity as tolerated  OXYGEN:  Home Oxygen: Yes.     Oxygen Delivery: 2 liters/min via Patient connected to nasal cannula oxygen  DISCHARGE LOCATION:  home   If you experience worsening of your admission symptoms, develop shortness of breath, life threatening emergency, suicidal or homicidal thoughts you must seek medical attention immediately by calling 911 or calling your MD immediately  if symptoms less severe.  You Must read complete instructions/literature along with all the possible adverse reactions/side effects for all the Medicines you take and that have been prescribed to you. Take any new Medicines after you have completely understood and accpet all the possible adverse reactions/side effects.   Please note  You were cared for by a hospitalist during your hospital stay. If you have any questions about your discharge medications or the care you received while you were in the hospital after you are discharged, you can call the unit and asked to speak with the hospitalist on call if the hospitalist that took care of you is not available. Once you are discharged, your primary care physician will handle any further medical issues. Please note that NO REFILLS for any discharge medications will be authorized once you are discharged, as it is imperative that you return to your primary care physician (or establish a relationship with a primary care physician if you do not have one) for your aftercare needs so that they can reassess your need for medications and monitor your lab values.   - Daily fluids < 2 liters. - Low salt diet - Check weight everyday and keep log. Take to your doctors appt. - Take extra dose of lasix if you gain more than 3 pounds weight.

## 2015-12-10 NOTE — Progress Notes (Signed)
Ambulated pt around nursing station without any complaints of pain or SOB

## 2015-12-10 NOTE — Care Management Important Message (Signed)
Important Message  Patient Details  Name: LIAD MIU MRN: [id number] Date of Birth: 12/27/1940   Medicare Important Message Given:  Yes    Jolly Mango, RN 12/10/2015, 12:23 PM

## 2015-12-10 NOTE — Care Management (Signed)
Patient offered LTACH and HHA. He completely and adamantly refuses any home care or LTACH services. Wife at bedside and agrees with what ever the patient says. Instructed both wife and patient on how to initiated [facility] services once at home if they change their minds. Case Closed

## 2015-12-10 NOTE — Progress Notes (Signed)
Inpatient Diabetes Program Recommendations  AACE/ADA: New Consensus Statement on Inpatient Glycemic Control (2015)  Target Ranges:  Prepandial:   less than 140 mg/dL      Peak postprandial:   less than 180 mg/dL (1-2 hours)      Critically ill patients:  140 - 180 mg/dL   Review of Glycemic Control  Results for ARMAHNI, MUCKELROY (MRN [id number]) as of 12/10/2015 09:17  Ref. Range 12/09/2015 07:58 12/09/2015 11:53 12/09/2015 15:33 12/09/2015 21:15 12/10/2015 07:32  Glucose-Capillary Latest Ref Range: 65-99 mg/dL 250 (H) 366 (H) 243 (H) 264 (H) 199 (H)   Diabetes history: none noted- A1C 6.7%   Outpatient Diabetes medications: none Current orders for Inpatient glycemic control: Novolog 0-9 units tid  Inpatient Diabetes Program Recommendations: Steroid doses decreasing-  Please consider adding Novolog 3 units tid with meals while the patient is on steroids and decrease as  the post prandial blood sugars improve.   Gentry Fitz, RN, BA, MHA, CDE Diabetes Coordinator Inpatient Diabetes Program  [phone] (Team Pager) [phone] ([place]) 12/09/2015 10:02 AM'

## 2015-12-10 NOTE — Progress Notes (Signed)
Discharge  Patient discharged at 1600. Vitals stable, no complaints.  IVs removed. Discharge instructions and education given to patient.  Some prescriptions sent to pharmacy, the rest given to patient. Follow up MD appts made for patient. Patient leaving floor with wife and volunteer via wheelchair.

## 2015-12-12 LAB — CULTURE, BLOOD (ROUTINE X 2)
Culture: NO GROWTH
Culture: NO GROWTH

## 2015-12-13 NOTE — Discharge Summary (Signed)
[facility] [place] NAME: Collin [name]    MR#:  [id number]  DATE OF BIRTH:  04-30-1941  DATE OF ADMISSION:  12/07/2015 ADMITTING PHYSICIAN: Dustin Flock, MD  DATE OF DISCHARGE: 12/10/2015  4:02 PM  PRIMARY CARE PHYSICIAN: [name], MD   ADMISSION DIAGNOSIS:  Healthcare-associated pneumonia [J18.9] NSTEMI (non-ST elevated myocardial infarction) ([place]) [I21.4] Acute on chronic systolic congestive heart failure ([place]) [I50.23]  DISCHARGE DIAGNOSIS:  Active Problems:   Acute CHF (congestive heart failure) ([place])   SECONDARY DIAGNOSIS:   Past Medical History  Diagnosis Date  . COPD (chronic obstructive pulmonary disease) ([place])   . Chronic systolic CHF (congestive heart failure) ([place])   . Kidney stone   . Macular degeneration   . HLD (hyperlipidemia)   . Hypertensive retinopathy   . HTN (hypertension)   . GERD (gastroesophageal reflux disease)   . Peripheral vascular disease of lower extremity ([place])   . Pleural effusion      ADMITTING HISTORY  HISTORY OF PRESENT ILLNESS: Collin [name] is a 75 y.o. male with a known history of COPD, chronic systolic CHF, history of pleural effusion with chronic Pleurx catheter as well as a right-sided nephrostomy tube was presenting with worsening shortness of breath. Patient was recently in the hospital and discharged 6 days ago. She was in the hospital for acute on chronic systolic CHF. He states that he started having shortness of breath since yesterday. His notices legs are more swollen. And his abdomen is more swollen. His been coughing and having some wheezing as well. No chest pains  HOSPITAL COURSE:   Collin [name] is a 76 y.o. male with a known history of 75 year old Caucasian male with a past medical history significant for CHF with EF of 26%, COPD, need for home oxygen, previous history of kidney stone, chronic kidney disease, hypertension, hyperlipidemia and peripheral vascular  disease. He presents to ED today due to a two-day history of worsening shortness of breath. Patient developed shortness of breath about 2 days ago and has gradually worsened over the last 2 days  1). Acute CHF exacerbation with elevated troponin - Ejection fraction 25% Patient diuresed with IV Lasix in the hospital. Swelling has improved. With diuresis slowly as outpatient due to history of CKD. He has no shortness of breath or pulmonary edema at this point. - Continue carvedilol. - Troponin is mildly elevated, chronic. - Measure daily weights, strict intake and output as well as fluid restriction. - Continue oxygen supplementation and DuoNeb's. - Exacerbation due to dietary noncompliance  Patient counseled extensively to be compliant with salt and fluid which has not done.   high risk for readmission due to noncompliance with dietary restrictions.  2). COPD exacerbation Treated with IV steroids and nebulizers along with antibiotics during the hospital stay. Finished course in the hospital Has mild wheezing but which is chronic.  3). Acute or chronic respiratory failure Secondary to acute CHF exacerbation and COPD exacerbation.  Resolved  4). Recurrent pleural effusion - chronic and will continue to drain chest tube(pleurx)  5). Tobacco abuse Counseled to quit smoking  6) nephrolithiasis s/p chronic nephrostomy tube placement -pt follows with Dr Erlene Quan as out pt  5). Hyperglycemia History of onset 6.7. Patient has been prolonged steroid use which could be the reason. At this time recommended lifestyle changes. If still elevated in spite of stopping steroids and lifestyle changes, will need medications.  Stable for discharge home. Home health nursing set up for CHF.  Patient needs to be compliant with his dietary restrictions and stop smoking to prevent future deterioration and readmissions.  CONSULTS OBTAINED:     DRUG ALLERGIES:   Allergies  Allergen Reactions  .  Lisinopril Anaphylaxis  . Metoprolol Other (See Comments)    Reaction:  Ringing in ears     DISCHARGE MEDICATIONS:   Discharge Medication List as of 12/10/2015  3:34 PM    START taking these medications   Details  HYDROcodone-acetaminophen (NORCO) 10-325 MG tablet Take 1 tablet by mouth every 8 (eight) hours as needed for moderate pain., Starting 12/10/2015, Until Discontinued, Print      CONTINUE these medications which have CHANGED   Details  ALPRAZolam (XANAX) 0.25 MG tablet Take 1 tablet (0.25 mg total) by mouth 2 (two) times daily as needed for anxiety or sleep., Starting 12/10/2015, Until Discontinued, Print    furosemide (LASIX) 80 MG tablet Take 1 tablet (80 mg total) by mouth 2 (two) times daily., Starting 12/10/2015, Until Discontinued, Print      CONTINUE these medications which have NOT CHANGED   Details  acetaminophen (TYLENOL) 500 MG tablet Take 1,000 mg by mouth every 6 (six) hours as needed for mild pain., Until Discontinued, Historical Med    albuterol (PROVENTIL HFA;VENTOLIN HFA) 108 (90 BASE) MCG/ACT inhaler Inhale 2 puffs into the lungs every 4 (four) hours as needed for wheezing or shortness of breath. , Until Discontinued, Historical Med    aspirin EC 81 MG tablet Take 81 mg by mouth daily., Until Discontinued, Historical Med    carvedilol (COREG) 6.25 MG tablet Take 6.25 mg by mouth 2 (two) times daily. , Until Discontinued, Historical Med    Fluticasone-Salmeterol (ADVAIR) 100-50 MCG/DOSE AEPB Inhale 1 puff into the lungs 2 (two) times daily., Until Discontinued, Historical Med    Multiple Vitamin (MULTIVITAMIN WITH MINERALS) TABS tablet Take 1 tablet by mouth daily., Until Discontinued, Historical Med    nitroGLYCERIN (NITROSTAT) 0.4 MG SL tablet Take 0.4 mg by mouth every 5 (five) minutes x 3 doses as needed for chest pain. , Until Discontinued, Historical Med    omeprazole (PRILOSEC) 20 MG capsule Take 20 mg by mouth 2 (two) times daily. , Until  Discontinued, Historical Med    tiotropium (SPIRIVA HANDIHALER) 18 MCG inhalation capsule Place 1 capsule (18 mcg total) into inhaler and inhale daily., Starting 03/06/2015, Until Discontinued, Print      STOP taking these medications     HYDROcodone-acetaminophen (NORCO) 7.5-325 MG tablet      oxyCODONE-acetaminophen (PERCOCET/ROXICET) 5-325 MG tablet         Today   VITAL SIGNS:  Blood pressure 124/75, pulse 99, temperature 97.4 F (36.3 C), temperature source Oral, resp. rate 20, height 6\' 2"  (1.88 m), weight 88.451 kg (195 lb), SpO2 98 %.  I/O:  No intake or output data in the 24 hours ending 12/13/15 1415  PHYSICAL EXAMINATION:  Physical Exam  GENERAL:  75 y.o.-year-old patient lying in the bed with no acute distress.  LUNGS: Normal breath sounds bilaterally, no wheezing, rales,rhonchi or crepitation. No use of accessory muscles of respiration.  CARDIOVASCULAR: S1, S2 normal. No murmurs, rubs, or gallops.  ABDOMEN: Soft, non-tender, non-distended. Bowel sounds present. No organomegaly or mass.  NEUROLOGIC: Moves all 4 extremities. PSYCHIATRIC: The patient is alert and oriented x 3.  SKIN: No obvious rash, lesion, or ulcer.   1+ lower extremity edema  DATA REVIEW:   CBC  Recent Labs Lab 12/08/15 0447  WBC 9.6  HGB  11.2*  HCT 36.5*  PLT 188    Chemistries   Recent Labs Lab 12/07/15 1137  12/09/15 0427  NA 134*  < > 130*  K 3.5  < > 4.0  CL 101  < > 97*  CO2 26  < > 25  GLUCOSE 131*  < > 316*  BUN 30*  < > 42*  CREATININE 1.66*  < > 1.59*  CALCIUM 8.0*  < > 8.4*  AST 21  --   --   ALT 26  --   --   ALKPHOS 195*  --   --   BILITOT 1.0  --   --   < > = values in this interval not displayed.  Cardiac Enzymes  Recent Labs Lab 12/07/15 1137  TROPONINI 0.16*    Microbiology Results  Results for orders placed or performed during the hospital encounter of 12/07/15  Blood Culture (routine x 2)     Status: None   Collection Time: 12/07/15  2:29  PM  Result Value Ref Range Status   Specimen Description BLOOD RIGHT FATTY CASTS  Final   Special Requests BOTTLES DRAWN AEROBIC AND ANAEROBIC  10CC  Final   Culture NO GROWTH 5 DAYS  Final   Report Status 12/12/2015 FINAL  Final  Blood Culture (routine x 2)     Status: None   Collection Time: 12/07/15  2:29 PM  Result Value Ref Range Status   Specimen Description BLOOD LEFT ASSIST CONTROL  Final   Special Requests   Final    BOTTLES DRAWN AEROBIC AND ANAEROBIC AER 6CC ANA 5CC   Culture NO GROWTH 5 DAYS  Final   Report Status 12/12/2015 FINAL  Final  Rapid Influenza A&B Antigens ([facility] only)     Status: None   Collection Time: 12/07/15  3:21 PM  Result Value Ref Range Status   Influenza A ([facility]) NEGATIVE NEGATIVE Final   Influenza B ([facility]) NEGATIVE NEGATIVE Final  Urine culture     Status: None   Collection Time: 12/07/15  5:17 PM  Result Value Ref Range Status   Specimen Description URINE, SUPRAPUBIC  Final   Special Requests NONE  Final   Culture NO GROWTH 2 DAYS  Final   Report Status 12/09/2015 FINAL  Final    RADIOLOGY:  No results found.  Follow up with PCP in 1 week.  Management plans discussed with the patient, family and they are in agreement.  CODE STATUS:  Code Status History    Date Active Date Inactive Code Status Order ID Comments User Context   12/07/2015  4:43 PM 12/10/2015  7:02 PM Full Code [id number]  Dustin Flock, MD ED   11/29/2015  8:27 PM 12/01/2015  4:15 PM Full Code [id number]  Sylvan Cheese, MD ED   10/26/2015 10:08 PM 10/29/2015  5:25 PM Full Code [id number]  Samson Frederic, DO Inpatient   08/22/2015  5:44 PM 08/31/2015  4:39 PM Full Code [id number]  Theodoro Grist, MD Inpatient   07/07/2015  1:57 PM 07/08/2015  2:28 PM Full Code [id number]  Nestor Lewandowsky, MD Inpatient   06/12/2015  3:54 PM 06/14/2015  5:01 PM Full Code [id number]  Baxter Hire, MD Inpatient   03/20/2015 12:20 AM 03/21/2015  9:25 PM Full Code [id number]  Lance Coon, MD ED   03/04/2015   5:52 AM 03/06/2015  2:26 PM Full Code [id number]  Lance Coon, MD Inpatient      TOTAL TIME TAKING CARE OF THIS PATIENT ON DAY OF  DISCHARGE: more than 30 minutes.   Hillary Bow R M.D on 12/13/2015 at 2:15 PM  Between 7am to 6pm - Pager - [phone]  After 6pm go to [email or web address] - password [email or web address] [facility]  Office  [phone]  CC: Primary care physician; [name], MD  Note: This dictation was prepared with Dragon dictation along with smaller phrase technology. Any transcriptional errors that result from this process are unintentional.

## 2015-12-14 ENCOUNTER — Telehealth: Payer: Self-pay | Admitting: Urology

## 2015-12-14 DIAGNOSIS — N135 Crossing vessel and stricture of ureter without hydronephrosis: Secondary | ICD-10-CM

## 2015-12-14 NOTE — Telephone Encounter (Signed)
In light of the patient's recent readmission with elevated troponins, CHF exacerbation, multiple other medical comorbidities, I do not feel that he is a surgical candidate at this time. As such, we will arrange for nephrostomy tube exchange.  Please arrange for follow-up in 6 weeks following nephrostomy tube placement.  Hollice Espy, MD

## 2015-12-20 DIAGNOSIS — N183 Chronic kidney disease, stage 3 (moderate): Secondary | ICD-10-CM | POA: Diagnosis present

## 2015-12-20 DIAGNOSIS — M869 Osteomyelitis, unspecified: Secondary | ICD-10-CM | POA: Diagnosis present

## 2015-12-20 DIAGNOSIS — I130 Hypertensive heart and chronic kidney disease with heart failure and stage 1 through stage 4 chronic kidney disease, or unspecified chronic kidney disease: Secondary | ICD-10-CM | POA: Diagnosis present

## 2015-12-20 DIAGNOSIS — Z9889 Other specified postprocedural states: Secondary | ICD-10-CM | POA: Diagnosis not present

## 2015-12-20 DIAGNOSIS — N179 Acute kidney failure, unspecified: Secondary | ICD-10-CM | POA: Diagnosis present

## 2015-12-20 DIAGNOSIS — I5022 Chronic systolic (congestive) heart failure: Secondary | ICD-10-CM | POA: Diagnosis present

## 2015-12-20 DIAGNOSIS — E1152 Type 2 diabetes mellitus with diabetic peripheral angiopathy with gangrene: Secondary | ICD-10-CM | POA: Diagnosis present

## 2015-12-20 DIAGNOSIS — I771 Stricture of artery: Secondary | ICD-10-CM | POA: Diagnosis present

## 2015-12-20 DIAGNOSIS — J439 Emphysema, unspecified: Secondary | ICD-10-CM | POA: Diagnosis not present

## 2015-12-20 DIAGNOSIS — K219 Gastro-esophageal reflux disease without esophagitis: Secondary | ICD-10-CM | POA: Diagnosis present

## 2015-12-20 DIAGNOSIS — R570 Cardiogenic shock: Secondary | ICD-10-CM | POA: Diagnosis not present

## 2015-12-20 DIAGNOSIS — I96 Gangrene, not elsewhere classified: Secondary | ICD-10-CM | POA: Diagnosis not present

## 2015-12-20 DIAGNOSIS — R931 Abnormal findings on diagnostic imaging of heart and coronary circulation: Secondary | ICD-10-CM | POA: Diagnosis not present

## 2015-12-20 DIAGNOSIS — E1122 Type 2 diabetes mellitus with diabetic chronic kidney disease: Secondary | ICD-10-CM | POA: Diagnosis present

## 2015-12-20 DIAGNOSIS — I70292 Other atherosclerosis of native arteries of extremities, left leg: Secondary | ICD-10-CM | POA: Diagnosis not present

## 2015-12-20 DIAGNOSIS — I739 Peripheral vascular disease, unspecified: Secondary | ICD-10-CM | POA: Diagnosis not present

## 2015-12-20 DIAGNOSIS — I2582 Chronic total occlusion of coronary artery: Secondary | ICD-10-CM | POA: Diagnosis not present

## 2015-12-20 DIAGNOSIS — R262 Difficulty in walking, not elsewhere classified: Secondary | ICD-10-CM | POA: Diagnosis not present

## 2015-12-20 DIAGNOSIS — I509 Heart failure, unspecified: Secondary | ICD-10-CM | POA: Diagnosis not present

## 2015-12-20 DIAGNOSIS — Z9981 Dependence on supplemental oxygen: Secondary | ICD-10-CM | POA: Diagnosis not present

## 2015-12-20 DIAGNOSIS — J9611 Chronic respiratory failure with hypoxia: Secondary | ICD-10-CM | POA: Diagnosis present

## 2015-12-20 DIAGNOSIS — J449 Chronic obstructive pulmonary disease, unspecified: Secondary | ICD-10-CM | POA: Diagnosis present

## 2015-12-20 DIAGNOSIS — I517 Cardiomegaly: Secondary | ICD-10-CM | POA: Diagnosis not present

## 2015-12-20 DIAGNOSIS — Z936 Other artificial openings of urinary tract status: Secondary | ICD-10-CM | POA: Diagnosis not present

## 2015-12-20 DIAGNOSIS — N401 Enlarged prostate with lower urinary tract symptoms: Secondary | ICD-10-CM | POA: Diagnosis not present

## 2015-12-20 DIAGNOSIS — M79672 Pain in left foot: Secondary | ICD-10-CM | POA: Diagnosis not present

## 2015-12-20 DIAGNOSIS — B370 Candidal stomatitis: Secondary | ICD-10-CM | POA: Diagnosis not present

## 2015-12-20 DIAGNOSIS — A419 Sepsis, unspecified organism: Secondary | ICD-10-CM | POA: Diagnosis not present

## 2015-12-20 DIAGNOSIS — L03116 Cellulitis of left lower limb: Secondary | ICD-10-CM | POA: Diagnosis present

## 2015-12-20 DIAGNOSIS — I5023 Acute on chronic systolic (congestive) heart failure: Secondary | ICD-10-CM | POA: Diagnosis present

## 2015-12-20 DIAGNOSIS — M25572 Pain in left ankle and joints of left foot: Secondary | ICD-10-CM | POA: Diagnosis not present

## 2015-12-20 DIAGNOSIS — I5189 Other ill-defined heart diseases: Secondary | ICD-10-CM | POA: Diagnosis not present

## 2015-12-20 DIAGNOSIS — E11628 Type 2 diabetes mellitus with other skin complications: Secondary | ICD-10-CM | POA: Diagnosis present

## 2015-12-20 DIAGNOSIS — N200 Calculus of kidney: Secondary | ICD-10-CM | POA: Diagnosis present

## 2015-12-20 DIAGNOSIS — Z01810 Encounter for preprocedural cardiovascular examination: Secondary | ICD-10-CM | POA: Diagnosis not present

## 2015-12-20 DIAGNOSIS — R0602 Shortness of breath: Secondary | ICD-10-CM | POA: Diagnosis not present

## 2015-12-20 DIAGNOSIS — F17200 Nicotine dependence, unspecified, uncomplicated: Secondary | ICD-10-CM | POA: Diagnosis present

## 2015-12-20 DIAGNOSIS — L0390 Cellulitis, unspecified: Secondary | ICD-10-CM | POA: Diagnosis not present

## 2015-12-20 DIAGNOSIS — E440 Moderate protein-calorie malnutrition: Secondary | ICD-10-CM | POA: Diagnosis not present

## 2015-12-20 DIAGNOSIS — Z95820 Peripheral vascular angioplasty status with implants and grafts: Secondary | ICD-10-CM | POA: Diagnosis not present

## 2015-12-20 DIAGNOSIS — J9811 Atelectasis: Secondary | ICD-10-CM | POA: Diagnosis not present

## 2015-12-20 DIAGNOSIS — J90 Pleural effusion, not elsewhere classified: Secondary | ICD-10-CM | POA: Diagnosis not present

## 2015-12-20 DIAGNOSIS — R188 Other ascites: Secondary | ICD-10-CM | POA: Diagnosis present

## 2015-12-20 DIAGNOSIS — E785 Hyperlipidemia, unspecified: Secondary | ICD-10-CM | POA: Diagnosis present

## 2015-12-20 DIAGNOSIS — R809 Proteinuria, unspecified: Secondary | ICD-10-CM | POA: Diagnosis not present

## 2015-12-20 DIAGNOSIS — I2510 Atherosclerotic heart disease of native coronary artery without angina pectoris: Secondary | ICD-10-CM | POA: Diagnosis present

## 2015-12-20 DIAGNOSIS — J811 Chronic pulmonary edema: Secondary | ICD-10-CM | POA: Diagnosis not present

## 2015-12-20 DIAGNOSIS — M7989 Other specified soft tissue disorders: Secondary | ICD-10-CM | POA: Diagnosis not present

## 2015-12-20 DIAGNOSIS — I10 Essential (primary) hypertension: Secondary | ICD-10-CM | POA: Diagnosis not present

## 2015-12-20 DIAGNOSIS — M86172 Other acute osteomyelitis, left ankle and foot: Secondary | ICD-10-CM | POA: Diagnosis not present

## 2015-12-21 NOTE — Telephone Encounter (Signed)
LMOM

## 2015-12-21 NOTE — Telephone Encounter (Signed)
Collin [name] states pt is not doing well. He has been at [facility] d/t his foot & she wants to cancel surgery. She agrees that we should proceed with nephrostomy tube exchange & states she will call back to schedule it when he feels better.

## 2015-12-21 NOTE — Telephone Encounter (Signed)
Please arrange exchange.  Collin Espy, MD

## 2015-12-29 ENCOUNTER — Ambulatory Visit: Payer: Medicare Other | Admitting: Family

## 2016-01-10 NOTE — Telephone Encounter (Signed)
Collin [name] is having his leg amputated on 01/19/16 & he is going to have to have a pacemaker but Mrs Pitzen didn't know when that would take place. She asks when to try to get him scheduled for the nephrostomy tube exchange?

## 2016-01-11 NOTE — Telephone Encounter (Signed)
Notified pt's wife, Silva Bandy, of nephrostomy tube exchange scheduled 01/14/16 with arrival time to [facility] at 7:00. Pt should be npo after mn & take heart medications with a sip of water morning of procedure.

## 2016-01-11 NOTE — Telephone Encounter (Signed)
See if IR can do it before his surgery?   Order placed.    Hollice Espy, MD

## 2016-01-13 ENCOUNTER — Telehealth: Payer: Self-pay | Admitting: Radiology

## 2016-01-13 NOTE — Telephone Encounter (Signed)
Plan seems reasonable. One potential option is to have his nephrostomy tube exchanged during the admission for the amputation. If he stays for a day or 2, tell him to have [place] consult it and we can arrange for this potentially.  Hollice Espy, MD

## 2016-01-13 NOTE — Telephone Encounter (Signed)
Pt's wife called stating pt wants to cancel nephrostomy tube exchange on 01/14/16. He says his foot is too tender to travel to the hospital. Wife states she will call back after his amputation scheduled 01/19/16 to reschedule.

## 2016-01-14 ENCOUNTER — Ambulatory Visit: Admission: RE | Admit: 2016-01-14 | Payer: Medicare Other | Source: Ambulatory Visit

## 2016-01-21 DIAGNOSIS — J918 Pleural effusion in other conditions classified elsewhere: Secondary | ICD-10-CM | POA: Diagnosis not present

## 2016-01-28 DIAGNOSIS — H3500 Unspecified background retinopathy: Secondary | ICD-10-CM | POA: Diagnosis not present

## 2016-01-28 DIAGNOSIS — J449 Chronic obstructive pulmonary disease, unspecified: Secondary | ICD-10-CM | POA: Diagnosis not present

## 2016-01-28 DIAGNOSIS — Z960 Presence of urogenital implants: Secondary | ICD-10-CM | POA: Diagnosis not present

## 2016-01-28 DIAGNOSIS — N1330 Unspecified hydronephrosis: Secondary | ICD-10-CM | POA: Diagnosis not present

## 2016-01-28 DIAGNOSIS — I43 Cardiomyopathy in diseases classified elsewhere: Secondary | ICD-10-CM | POA: Diagnosis not present

## 2016-01-28 DIAGNOSIS — R05 Cough: Secondary | ICD-10-CM | POA: Diagnosis not present

## 2016-01-28 DIAGNOSIS — G8929 Other chronic pain: Secondary | ICD-10-CM | POA: Diagnosis not present

## 2016-01-28 DIAGNOSIS — G546 Phantom limb syndrome with pain: Secondary | ICD-10-CM | POA: Diagnosis not present

## 2016-01-28 DIAGNOSIS — G4700 Insomnia, unspecified: Secondary | ICD-10-CM | POA: Diagnosis not present

## 2016-01-28 DIAGNOSIS — F329 Major depressive disorder, single episode, unspecified: Secondary | ICD-10-CM | POA: Diagnosis not present

## 2016-01-28 DIAGNOSIS — D649 Anemia, unspecified: Secondary | ICD-10-CM | POA: Diagnosis not present

## 2016-01-28 DIAGNOSIS — Z89612 Acquired absence of left leg above knee: Secondary | ICD-10-CM | POA: Diagnosis not present

## 2016-01-28 DIAGNOSIS — R0600 Dyspnea, unspecified: Secondary | ICD-10-CM | POA: Diagnosis not present

## 2016-01-28 DIAGNOSIS — N390 Urinary tract infection, site not specified: Secondary | ICD-10-CM | POA: Diagnosis not present

## 2016-01-28 DIAGNOSIS — M869 Osteomyelitis, unspecified: Secondary | ICD-10-CM | POA: Diagnosis present

## 2016-01-28 DIAGNOSIS — N200 Calculus of kidney: Secondary | ICD-10-CM | POA: Diagnosis not present

## 2016-01-28 DIAGNOSIS — M48062 Spinal stenosis, lumbar region with neurogenic claudication: Secondary | ICD-10-CM | POA: Diagnosis not present

## 2016-01-28 DIAGNOSIS — K219 Gastro-esophageal reflux disease without esophagitis: Secondary | ICD-10-CM | POA: Diagnosis present

## 2016-01-28 DIAGNOSIS — I2510 Atherosclerotic heart disease of native coronary artery without angina pectoris: Secondary | ICD-10-CM | POA: Diagnosis present

## 2016-01-28 DIAGNOSIS — F419 Anxiety disorder, unspecified: Secondary | ICD-10-CM | POA: Diagnosis not present

## 2016-01-28 DIAGNOSIS — D72829 Elevated white blood cell count, unspecified: Secondary | ICD-10-CM | POA: Diagnosis not present

## 2016-01-28 DIAGNOSIS — Z9981 Dependence on supplemental oxygen: Secondary | ICD-10-CM | POA: Diagnosis not present

## 2016-01-28 DIAGNOSIS — N211 Calculus in urethra: Secondary | ICD-10-CM | POA: Diagnosis not present

## 2016-01-28 DIAGNOSIS — I739 Peripheral vascular disease, unspecified: Secondary | ICD-10-CM | POA: Diagnosis not present

## 2016-01-28 DIAGNOSIS — I70245 Atherosclerosis of native arteries of left leg with ulceration of other part of foot: Secondary | ICD-10-CM | POA: Diagnosis not present

## 2016-01-28 DIAGNOSIS — I429 Cardiomyopathy, unspecified: Secondary | ICD-10-CM | POA: Diagnosis present

## 2016-01-28 DIAGNOSIS — B999 Unspecified infectious disease: Secondary | ICD-10-CM | POA: Diagnosis not present

## 2016-01-28 DIAGNOSIS — J431 Panlobular emphysema: Secondary | ICD-10-CM | POA: Diagnosis not present

## 2016-01-28 DIAGNOSIS — I130 Hypertensive heart and chronic kidney disease with heart failure and stage 1 through stage 4 chronic kidney disease, or unspecified chronic kidney disease: Secondary | ICD-10-CM | POA: Diagnosis present

## 2016-01-28 DIAGNOSIS — J811 Chronic pulmonary edema: Secondary | ICD-10-CM | POA: Diagnosis not present

## 2016-01-28 DIAGNOSIS — I10 Essential (primary) hypertension: Secondary | ICD-10-CM | POA: Diagnosis not present

## 2016-01-28 DIAGNOSIS — J9611 Chronic respiratory failure with hypoxia: Secondary | ICD-10-CM | POA: Diagnosis present

## 2016-01-28 DIAGNOSIS — E785 Hyperlipidemia, unspecified: Secondary | ICD-10-CM | POA: Diagnosis present

## 2016-01-28 DIAGNOSIS — I998 Other disorder of circulatory system: Secondary | ICD-10-CM | POA: Diagnosis present

## 2016-01-28 DIAGNOSIS — I517 Cardiomegaly: Secondary | ICD-10-CM | POA: Diagnosis not present

## 2016-01-28 DIAGNOSIS — I272 Other secondary pulmonary hypertension: Secondary | ICD-10-CM | POA: Diagnosis present

## 2016-01-28 DIAGNOSIS — L03116 Cellulitis of left lower limb: Secondary | ICD-10-CM | POA: Diagnosis present

## 2016-01-28 DIAGNOSIS — G8918 Other acute postprocedural pain: Secondary | ICD-10-CM | POA: Diagnosis not present

## 2016-01-28 DIAGNOSIS — D509 Iron deficiency anemia, unspecified: Secondary | ICD-10-CM | POA: Diagnosis present

## 2016-01-28 DIAGNOSIS — J441 Chronic obstructive pulmonary disease with (acute) exacerbation: Secondary | ICD-10-CM | POA: Diagnosis present

## 2016-01-28 DIAGNOSIS — H35039 Hypertensive retinopathy, unspecified eye: Secondary | ICD-10-CM | POA: Diagnosis present

## 2016-01-28 DIAGNOSIS — R5381 Other malaise: Secondary | ICD-10-CM | POA: Diagnosis not present

## 2016-01-28 DIAGNOSIS — J9811 Atelectasis: Secondary | ICD-10-CM | POA: Diagnosis not present

## 2016-01-28 DIAGNOSIS — T8389XA Other specified complication of genitourinary prosthetic devices, implants and grafts, initial encounter: Secondary | ICD-10-CM | POA: Diagnosis present

## 2016-01-28 DIAGNOSIS — M6281 Muscle weakness (generalized): Secondary | ICD-10-CM | POA: Diagnosis not present

## 2016-01-28 DIAGNOSIS — N209 Urinary calculus, unspecified: Secondary | ICD-10-CM | POA: Diagnosis not present

## 2016-01-28 DIAGNOSIS — K567 Ileus, unspecified: Secondary | ICD-10-CM | POA: Diagnosis not present

## 2016-01-28 DIAGNOSIS — R918 Other nonspecific abnormal finding of lung field: Secondary | ICD-10-CM | POA: Diagnosis not present

## 2016-01-28 DIAGNOSIS — Z436 Encounter for attention to other artificial openings of urinary tract: Secondary | ICD-10-CM | POA: Diagnosis not present

## 2016-01-28 DIAGNOSIS — J910 Malignant pleural effusion: Secondary | ICD-10-CM | POA: Diagnosis not present

## 2016-01-28 DIAGNOSIS — N179 Acute kidney failure, unspecified: Secondary | ICD-10-CM | POA: Diagnosis present

## 2016-01-28 DIAGNOSIS — I2582 Chronic total occlusion of coronary artery: Secondary | ICD-10-CM | POA: Diagnosis present

## 2016-01-28 DIAGNOSIS — R931 Abnormal findings on diagnostic imaging of heart and coronary circulation: Secondary | ICD-10-CM | POA: Diagnosis not present

## 2016-01-28 DIAGNOSIS — Z4781 Encounter for orthopedic aftercare following surgical amputation: Secondary | ICD-10-CM | POA: Diagnosis not present

## 2016-01-28 DIAGNOSIS — M79672 Pain in left foot: Secondary | ICD-10-CM | POA: Diagnosis not present

## 2016-01-28 DIAGNOSIS — C4491 Basal cell carcinoma of skin, unspecified: Secondary | ICD-10-CM | POA: Diagnosis present

## 2016-01-28 DIAGNOSIS — D638 Anemia in other chronic diseases classified elsewhere: Secondary | ICD-10-CM | POA: Diagnosis present

## 2016-01-28 DIAGNOSIS — J439 Emphysema, unspecified: Secondary | ICD-10-CM | POA: Diagnosis not present

## 2016-01-28 DIAGNOSIS — M4806 Spinal stenosis, lumbar region: Secondary | ICD-10-CM | POA: Diagnosis not present

## 2016-01-28 DIAGNOSIS — R600 Localized edema: Secondary | ICD-10-CM | POA: Diagnosis not present

## 2016-01-28 DIAGNOSIS — C44719 Basal cell carcinoma of skin of left lower limb, including hip: Secondary | ICD-10-CM | POA: Diagnosis not present

## 2016-01-28 DIAGNOSIS — Z9181 History of falling: Secondary | ICD-10-CM | POA: Diagnosis not present

## 2016-01-28 DIAGNOSIS — N210 Calculus in bladder: Secondary | ICD-10-CM | POA: Diagnosis not present

## 2016-01-28 DIAGNOSIS — E782 Mixed hyperlipidemia: Secondary | ICD-10-CM | POA: Diagnosis not present

## 2016-01-28 DIAGNOSIS — H3530 Unspecified macular degeneration: Secondary | ICD-10-CM | POA: Diagnosis not present

## 2016-01-28 DIAGNOSIS — D519 Vitamin B12 deficiency anemia, unspecified: Secondary | ICD-10-CM | POA: Diagnosis not present

## 2016-01-28 DIAGNOSIS — A047 Enterocolitis due to Clostridium difficile: Secondary | ICD-10-CM | POA: Diagnosis present

## 2016-01-28 DIAGNOSIS — F17210 Nicotine dependence, cigarettes, uncomplicated: Secondary | ICD-10-CM | POA: Diagnosis present

## 2016-01-28 DIAGNOSIS — R000 Tachycardia, unspecified: Secondary | ICD-10-CM | POA: Diagnosis not present

## 2016-01-28 DIAGNOSIS — I96 Gangrene, not elsewhere classified: Secondary | ICD-10-CM | POA: Diagnosis present

## 2016-01-28 DIAGNOSIS — J90 Pleural effusion, not elsewhere classified: Secondary | ICD-10-CM | POA: Diagnosis not present

## 2016-01-28 DIAGNOSIS — Z741 Need for assistance with personal care: Secondary | ICD-10-CM | POA: Diagnosis not present

## 2016-01-28 DIAGNOSIS — L97529 Non-pressure chronic ulcer of other part of left foot with unspecified severity: Secondary | ICD-10-CM | POA: Diagnosis not present

## 2016-01-28 DIAGNOSIS — N183 Chronic kidney disease, stage 3 (moderate): Secondary | ICD-10-CM | POA: Diagnosis present

## 2016-01-28 DIAGNOSIS — R0602 Shortness of breath: Secondary | ICD-10-CM | POA: Diagnosis not present

## 2016-01-28 DIAGNOSIS — F4323 Adjustment disorder with mixed anxiety and depressed mood: Secondary | ICD-10-CM | POA: Diagnosis not present

## 2016-01-28 DIAGNOSIS — I5023 Acute on chronic systolic (congestive) heart failure: Secondary | ICD-10-CM | POA: Diagnosis present

## 2016-01-28 DIAGNOSIS — M79605 Pain in left leg: Secondary | ICD-10-CM | POA: Diagnosis not present

## 2016-01-28 DIAGNOSIS — G479 Sleep disorder, unspecified: Secondary | ICD-10-CM | POA: Diagnosis not present

## 2016-02-03 NOTE — Telephone Encounter (Signed)
Pt's wife called stating pt is currently admitted to [facility] & SPT exchange will be performed during admission.

## 2016-02-04 NOTE — Telephone Encounter (Signed)
Please arrange f/u in 3 months.  Hollice Espy, MD

## 2016-02-04 NOTE — Telephone Encounter (Signed)
LMOM regarding appt scheduled 05/12/16 for f/u with Dr Erlene Quan.

## 2016-03-04 DIAGNOSIS — H3500 Unspecified background retinopathy: Secondary | ICD-10-CM | POA: Diagnosis not present

## 2016-03-04 DIAGNOSIS — F4323 Adjustment disorder with mixed anxiety and depressed mood: Secondary | ICD-10-CM | POA: Diagnosis not present

## 2016-03-04 DIAGNOSIS — J90 Pleural effusion, not elsewhere classified: Secondary | ICD-10-CM | POA: Diagnosis not present

## 2016-03-04 DIAGNOSIS — B3749 Other urogenital candidiasis: Secondary | ICD-10-CM | POA: Diagnosis not present

## 2016-03-04 DIAGNOSIS — R05 Cough: Secondary | ICD-10-CM | POA: Diagnosis not present

## 2016-03-04 DIAGNOSIS — I2510 Atherosclerotic heart disease of native coronary artery without angina pectoris: Secondary | ICD-10-CM | POA: Diagnosis not present

## 2016-03-04 DIAGNOSIS — Z008 Encounter for other general examination: Secondary | ICD-10-CM | POA: Diagnosis not present

## 2016-03-04 DIAGNOSIS — N179 Acute kidney failure, unspecified: Secondary | ICD-10-CM | POA: Diagnosis not present

## 2016-03-04 DIAGNOSIS — M6281 Muscle weakness (generalized): Secondary | ICD-10-CM | POA: Diagnosis not present

## 2016-03-04 DIAGNOSIS — G479 Sleep disorder, unspecified: Secondary | ICD-10-CM | POA: Diagnosis not present

## 2016-03-04 DIAGNOSIS — I998 Other disorder of circulatory system: Secondary | ICD-10-CM | POA: Diagnosis not present

## 2016-03-04 DIAGNOSIS — Z741 Need for assistance with personal care: Secondary | ICD-10-CM | POA: Diagnosis not present

## 2016-03-04 DIAGNOSIS — Z89612 Acquired absence of left leg above knee: Secondary | ICD-10-CM | POA: Diagnosis not present

## 2016-03-04 DIAGNOSIS — Z936 Other artificial openings of urinary tract status: Secondary | ICD-10-CM | POA: Diagnosis not present

## 2016-03-04 DIAGNOSIS — Z960 Presence of urogenital implants: Secondary | ICD-10-CM | POA: Diagnosis not present

## 2016-03-04 DIAGNOSIS — I96 Gangrene, not elsewhere classified: Secondary | ICD-10-CM | POA: Diagnosis not present

## 2016-03-04 DIAGNOSIS — K219 Gastro-esophageal reflux disease without esophagitis: Secondary | ICD-10-CM | POA: Diagnosis not present

## 2016-03-04 DIAGNOSIS — G8929 Other chronic pain: Secondary | ICD-10-CM | POA: Diagnosis not present

## 2016-03-04 DIAGNOSIS — J189 Pneumonia, unspecified organism: Secondary | ICD-10-CM | POA: Diagnosis not present

## 2016-03-04 DIAGNOSIS — D519 Vitamin B12 deficiency anemia, unspecified: Secondary | ICD-10-CM | POA: Diagnosis not present

## 2016-03-04 DIAGNOSIS — I43 Cardiomyopathy in diseases classified elsewhere: Secondary | ICD-10-CM | POA: Diagnosis not present

## 2016-03-04 DIAGNOSIS — F419 Anxiety disorder, unspecified: Secondary | ICD-10-CM | POA: Diagnosis not present

## 2016-03-04 DIAGNOSIS — F329 Major depressive disorder, single episode, unspecified: Secondary | ICD-10-CM | POA: Diagnosis not present

## 2016-03-04 DIAGNOSIS — D649 Anemia, unspecified: Secondary | ICD-10-CM | POA: Diagnosis not present

## 2016-03-04 DIAGNOSIS — H3530 Unspecified macular degeneration: Secondary | ICD-10-CM | POA: Diagnosis not present

## 2016-03-04 DIAGNOSIS — Z89619 Acquired absence of unspecified leg above knee: Secondary | ICD-10-CM | POA: Diagnosis not present

## 2016-03-04 DIAGNOSIS — Z4781 Encounter for orthopedic aftercare following surgical amputation: Secondary | ICD-10-CM | POA: Diagnosis not present

## 2016-03-04 DIAGNOSIS — N189 Chronic kidney disease, unspecified: Secondary | ICD-10-CM | POA: Diagnosis not present

## 2016-03-04 DIAGNOSIS — N1330 Unspecified hydronephrosis: Secondary | ICD-10-CM | POA: Diagnosis not present

## 2016-03-04 DIAGNOSIS — N183 Chronic kidney disease, stage 3 (moderate): Secondary | ICD-10-CM | POA: Diagnosis not present

## 2016-03-04 DIAGNOSIS — M48062 Spinal stenosis, lumbar region with neurogenic claudication: Secondary | ICD-10-CM | POA: Diagnosis not present

## 2016-03-04 DIAGNOSIS — N390 Urinary tract infection, site not specified: Secondary | ICD-10-CM | POA: Diagnosis not present

## 2016-03-04 DIAGNOSIS — M4806 Spinal stenosis, lumbar region: Secondary | ICD-10-CM | POA: Diagnosis not present

## 2016-03-04 DIAGNOSIS — I739 Peripheral vascular disease, unspecified: Secondary | ICD-10-CM | POA: Diagnosis not present

## 2016-03-04 DIAGNOSIS — G4700 Insomnia, unspecified: Secondary | ICD-10-CM | POA: Diagnosis not present

## 2016-03-04 DIAGNOSIS — M79605 Pain in left leg: Secondary | ICD-10-CM | POA: Diagnosis not present

## 2016-03-04 DIAGNOSIS — J449 Chronic obstructive pulmonary disease, unspecified: Secondary | ICD-10-CM | POA: Diagnosis not present

## 2016-03-04 DIAGNOSIS — I509 Heart failure, unspecified: Secondary | ICD-10-CM | POA: Diagnosis not present

## 2016-03-04 DIAGNOSIS — I5023 Acute on chronic systolic (congestive) heart failure: Secondary | ICD-10-CM | POA: Diagnosis not present

## 2016-03-04 DIAGNOSIS — J910 Malignant pleural effusion: Secondary | ICD-10-CM | POA: Diagnosis not present

## 2016-03-04 DIAGNOSIS — Z9181 History of falling: Secondary | ICD-10-CM | POA: Diagnosis not present

## 2016-03-04 DIAGNOSIS — I214 Non-ST elevation (NSTEMI) myocardial infarction: Secondary | ICD-10-CM | POA: Diagnosis not present

## 2016-03-04 DIAGNOSIS — I10 Essential (primary) hypertension: Secondary | ICD-10-CM | POA: Diagnosis not present

## 2016-03-04 DIAGNOSIS — R635 Abnormal weight gain: Secondary | ICD-10-CM | POA: Diagnosis not present

## 2016-03-04 DIAGNOSIS — N209 Urinary calculus, unspecified: Secondary | ICD-10-CM | POA: Diagnosis not present

## 2016-03-04 DIAGNOSIS — F418 Other specified anxiety disorders: Secondary | ICD-10-CM | POA: Diagnosis not present

## 2016-03-04 DIAGNOSIS — E785 Hyperlipidemia, unspecified: Secondary | ICD-10-CM | POA: Diagnosis not present

## 2016-03-09 DIAGNOSIS — I214 Non-ST elevation (NSTEMI) myocardial infarction: Secondary | ICD-10-CM | POA: Diagnosis not present

## 2016-03-09 DIAGNOSIS — I2510 Atherosclerotic heart disease of native coronary artery without angina pectoris: Secondary | ICD-10-CM | POA: Diagnosis not present

## 2016-03-09 DIAGNOSIS — F419 Anxiety disorder, unspecified: Secondary | ICD-10-CM | POA: Diagnosis not present

## 2016-03-09 DIAGNOSIS — G8929 Other chronic pain: Secondary | ICD-10-CM | POA: Diagnosis not present

## 2016-03-09 DIAGNOSIS — I509 Heart failure, unspecified: Secondary | ICD-10-CM | POA: Diagnosis not present

## 2016-03-10 DIAGNOSIS — I509 Heart failure, unspecified: Secondary | ICD-10-CM | POA: Diagnosis not present

## 2016-03-10 DIAGNOSIS — Z89619 Acquired absence of unspecified leg above knee: Secondary | ICD-10-CM | POA: Diagnosis not present

## 2016-03-10 DIAGNOSIS — Z936 Other artificial openings of urinary tract status: Secondary | ICD-10-CM | POA: Diagnosis not present

## 2016-03-10 DIAGNOSIS — I96 Gangrene, not elsewhere classified: Secondary | ICD-10-CM | POA: Diagnosis not present

## 2016-03-31 DIAGNOSIS — J189 Pneumonia, unspecified organism: Secondary | ICD-10-CM | POA: Diagnosis not present

## 2016-03-31 DIAGNOSIS — G8929 Other chronic pain: Secondary | ICD-10-CM | POA: Diagnosis not present

## 2016-03-31 DIAGNOSIS — Z89612 Acquired absence of left leg above knee: Secondary | ICD-10-CM | POA: Diagnosis not present

## 2016-04-02 DIAGNOSIS — J189 Pneumonia, unspecified organism: Secondary | ICD-10-CM | POA: Diagnosis not present

## 2016-04-02 DIAGNOSIS — I509 Heart failure, unspecified: Secondary | ICD-10-CM | POA: Diagnosis not present

## 2016-04-02 DIAGNOSIS — J449 Chronic obstructive pulmonary disease, unspecified: Secondary | ICD-10-CM | POA: Diagnosis not present

## 2016-04-07 DIAGNOSIS — J90 Pleural effusion, not elsewhere classified: Secondary | ICD-10-CM | POA: Diagnosis not present

## 2016-04-07 DIAGNOSIS — R635 Abnormal weight gain: Secondary | ICD-10-CM | POA: Diagnosis not present

## 2016-04-07 DIAGNOSIS — I509 Heart failure, unspecified: Secondary | ICD-10-CM | POA: Diagnosis not present

## 2016-04-14 DIAGNOSIS — Z89612 Acquired absence of left leg above knee: Secondary | ICD-10-CM | POA: Diagnosis not present

## 2016-04-14 DIAGNOSIS — I509 Heart failure, unspecified: Secondary | ICD-10-CM | POA: Diagnosis not present

## 2016-04-14 DIAGNOSIS — J90 Pleural effusion, not elsewhere classified: Secondary | ICD-10-CM | POA: Diagnosis not present

## 2016-04-14 DIAGNOSIS — F418 Other specified anxiety disorders: Secondary | ICD-10-CM | POA: Diagnosis not present

## 2016-05-05 DIAGNOSIS — I96 Gangrene, not elsewhere classified: Secondary | ICD-10-CM | POA: Diagnosis not present

## 2016-05-05 DIAGNOSIS — J90 Pleural effusion, not elsewhere classified: Secondary | ICD-10-CM | POA: Diagnosis not present

## 2016-05-05 DIAGNOSIS — Z89612 Acquired absence of left leg above knee: Secondary | ICD-10-CM | POA: Diagnosis not present

## 2016-05-12 ENCOUNTER — Ambulatory Visit: Payer: Medicare Other | Admitting: Urology

## 2016-05-12 DIAGNOSIS — J189 Pneumonia, unspecified organism: Secondary | ICD-10-CM | POA: Diagnosis not present

## 2016-05-12 DIAGNOSIS — N390 Urinary tract infection, site not specified: Secondary | ICD-10-CM | POA: Diagnosis not present

## 2016-05-12 DIAGNOSIS — Z936 Other artificial openings of urinary tract status: Secondary | ICD-10-CM | POA: Diagnosis not present

## 2016-05-12 DIAGNOSIS — I509 Heart failure, unspecified: Secondary | ICD-10-CM | POA: Diagnosis not present

## 2016-05-16 ENCOUNTER — Telehealth: Payer: Self-pay | Admitting: Urology

## 2016-05-16 ENCOUNTER — Ambulatory Visit: Payer: Medicare Other | Admitting: Urology

## 2016-05-16 NOTE — Telephone Encounter (Signed)
Mr. Collin [name] was a no show to clinic today.    He is likely overdue for a nephrostomy tube exchange. Can you reach out to him and see how is doing?  If he is unable to make it to clinic, we can just arrange for tube exchange as outpatient.    Hollice Espy, MD

## 2016-05-17 ENCOUNTER — Encounter: Payer: Self-pay | Admitting: Urology

## 2016-05-17 NOTE — Telephone Encounter (Signed)
LMOM

## 2016-05-21 DIAGNOSIS — G8929 Other chronic pain: Secondary | ICD-10-CM | POA: Diagnosis not present

## 2016-05-21 DIAGNOSIS — E785 Hyperlipidemia, unspecified: Secondary | ICD-10-CM | POA: Diagnosis not present

## 2016-05-21 DIAGNOSIS — Z89612 Acquired absence of left leg above knee: Secondary | ICD-10-CM | POA: Diagnosis not present

## 2016-05-21 DIAGNOSIS — I739 Peripheral vascular disease, unspecified: Secondary | ICD-10-CM | POA: Diagnosis not present

## 2016-05-21 DIAGNOSIS — K219 Gastro-esophageal reflux disease without esophagitis: Secondary | ICD-10-CM | POA: Diagnosis not present

## 2016-05-21 DIAGNOSIS — I10 Essential (primary) hypertension: Secondary | ICD-10-CM | POA: Diagnosis not present

## 2016-05-21 DIAGNOSIS — I509 Heart failure, unspecified: Secondary | ICD-10-CM | POA: Diagnosis not present

## 2016-05-21 DIAGNOSIS — J449 Chronic obstructive pulmonary disease, unspecified: Secondary | ICD-10-CM | POA: Diagnosis not present

## 2016-05-22 DIAGNOSIS — F419 Anxiety disorder, unspecified: Secondary | ICD-10-CM | POA: Diagnosis not present

## 2016-05-22 DIAGNOSIS — G4700 Insomnia, unspecified: Secondary | ICD-10-CM | POA: Diagnosis not present

## 2016-05-23 NOTE — Telephone Encounter (Signed)
LMOM

## 2016-06-05 NOTE — Telephone Encounter (Signed)
Pt's wife called back & made appt for 06/09/16 @8 :30 with Dr Erlene Quan.

## 2016-06-06 ENCOUNTER — Ambulatory Visit (INDEPENDENT_AMBULATORY_CARE_PROVIDER_SITE_OTHER): Payer: Medicare Other

## 2016-06-06 VITALS — BP 133/67 | HR 74 | Ht 74.0 in | Wt 172.0 lb

## 2016-06-06 DIAGNOSIS — N189 Chronic kidney disease, unspecified: Secondary | ICD-10-CM | POA: Diagnosis not present

## 2016-06-06 DIAGNOSIS — N179 Acute kidney failure, unspecified: Secondary | ICD-10-CM

## 2016-06-06 NOTE — Progress Notes (Signed)
Pt called stating nephrostomy bag is tore and now leaking. Pt came into clinic where tubing was exchanged and new leg bag applied. Pt tolerated well. No s/s of adverse reaction noted.   Blood pressure 133/67, pulse 74, height 6\' 2"  (1.88 m), weight 172 lb (78 kg).

## 2016-06-09 ENCOUNTER — Telehealth: Payer: Self-pay | Admitting: Radiology

## 2016-06-09 ENCOUNTER — Ambulatory Visit (INDEPENDENT_AMBULATORY_CARE_PROVIDER_SITE_OTHER): Payer: Medicare Other | Admitting: Urology

## 2016-06-09 ENCOUNTER — Other Ambulatory Visit: Payer: Self-pay | Admitting: Radiology

## 2016-06-09 VITALS — BP 125/70 | HR 72 | Ht 74.0 in | Wt 170.0 lb

## 2016-06-09 DIAGNOSIS — B3749 Other urogenital candidiasis: Secondary | ICD-10-CM

## 2016-06-09 DIAGNOSIS — Z960 Presence of urogenital implants: Secondary | ICD-10-CM

## 2016-06-09 DIAGNOSIS — N189 Chronic kidney disease, unspecified: Secondary | ICD-10-CM

## 2016-06-09 DIAGNOSIS — N179 Acute kidney failure, unspecified: Secondary | ICD-10-CM

## 2016-06-09 DIAGNOSIS — N1330 Unspecified hydronephrosis: Secondary | ICD-10-CM

## 2016-06-09 DIAGNOSIS — N135 Crossing vessel and stricture of ureter without hydronephrosis: Secondary | ICD-10-CM

## 2016-06-09 NOTE — Telephone Encounter (Signed)
LMOM notifying pt's wife of appt with Dr Ubaldo Glassing on 06/12/16 with arrival time of 1:45 for 2:00 appt for surgical clearance.

## 2016-06-09 NOTE — Progress Notes (Signed)
9:04 AM  06/09/16  Collin [name] November 05, 1940 [id number]  Referring provider: [name], MD [facility], [place], [place] [address]  Chief Complaint  Patient presents with  . Hydronephrosis    nephrostomy tube     HPI: 75 yo male with significant comorbidities including COPD, chronic pleural effusion with Pleurx catheter, CHF, PAD s/p recent left AKA and acute on chronic renal failure with retained obstructed right ureteral stent managed with right nephrostomy tube who returns today after recent critical illness with prolonged admission to [facility] on 02/02/16.  His nephrostomy tube was exchanged during that admission.  He's had several episodes of acute on chronic renal failure requiring temporary dialysis in the past. His baseline creatinine is a proximally 1.6.   Definitive management of his encrusted ureteral stent has been delayed on numerous occasions secondary to multiple other critical illnesses such as CHF exacerbation or need for limb revascularization followed by ultimate amputation.  He is followed by Dr. Ubaldo Glassing and Dr. Raul Del for his pulmonary and cardiac issues. He's also been seeing Dr. Genevive Bi for chronic pleural effusion.  Today, he appears quite well compared to on previous occasions. He's feeling much stronger after his recovery from his June admission. He does report that he is passes kidney stones on a fairly regular basis. He reports that he passed 17 stones during his admission at [facility].  He did have a CT stone protocol on 02/10/2016 at 2, which showed a large right-sided stone burden measuring 2.3 x 1 x 2.1 cm on the right upper pole without hydronephrosis. There was stone debris along the ureteral stent as well as stone measuring 0.9 x 0.6 x 0.9 cm urinary bladder on the ureteral stent.  He also has a 1.3 x 1.1 x 1.0 cm within the left renal pelvis with left hydroureteronephrosis.  Most recent Cr stable 03/01/16 on 1.6.  Most recent UCx on  02/23/16 with mix flora.  He is chronically colonized with yeast.     PMH: Past Medical History:  Diagnosis Date  . Chronic systolic CHF (congestive heart failure) ([place])   . COPD (chronic obstructive pulmonary disease) ([place])   . GERD (gastroesophageal reflux disease)   . HLD (hyperlipidemia)   . HTN (hypertension)   . Hypertensive retinopathy   . Kidney stone   . Macular degeneration   . Peripheral vascular disease of lower extremity ([place])   . Pleural effusion     Surgical History: Past Surgical History:  Procedure Laterality Date  . ABDOMINAL AORTIC ANEURYSM REPAIR    . CHEST TUBE INSERTION Right 07/07/2015   Procedure: INSERTION PLEURAL DRAINAGE CATHETER;  Surgeon: Nestor Lewandowsky, MD;  Location: [facility] ORS;  Service: Thoracic;  Laterality: Right;  . HERNIA REPAIR    . KIDNEY STONE SURGERY    . NEPHROSTOMY TUBE PLACEMENT ([place] HX)    . PERIPHERAL VASCULAR CATHETERIZATION N/A 11/03/2015   Procedure: Abdominal Aortogram w/Lower Extremity;  Surgeon: Algernon Huxley, MD;  Location: [place] [facility];  Service: Cardiovascular;  Laterality: N/A;  . PERIPHERAL VASCULAR CATHETERIZATION  11/03/2015   Procedure: Lower Extremity Intervention;  Surgeon: Algernon Huxley, MD;  Location: [place] [facility];  Service: Cardiovascular;;  . PERIPHERAL VASCULAR CATHETERIZATION Left 11/10/2015   Procedure: Lower Extremity Angiography;  Surgeon: Algernon Huxley, MD;  Location: [place] [facility];  Service: Cardiovascular;  Laterality: Left;  . PERIPHERAL VASCULAR CATHETERIZATION  11/10/2015   Procedure: Lower Extremity Intervention;  Surgeon: Algernon Huxley, MD;  Location: [place] [facility];  Service: Cardiovascular;;  . peripheral vascular stent Left     Home Medications:    Medication List       Accurate as of 06/09/16  9:04 AM. Always use your most recent med list.          acetaminophen 500 MG tablet Commonly known as:  TYLENOL Take 1,000 mg by mouth every 6 (six) hours as needed for mild pain.     albuterol 108 (90 Base) MCG/ACT inhaler Commonly known as:  PROVENTIL HFA;VENTOLIN HFA Inhale 2 puffs into the lungs every 4 (four) hours as needed for wheezing or shortness of breath.   ALPRAZolam 0.25 MG tablet Commonly known as:  XANAX Take 1 tablet (0.25 mg total) by mouth 2 (two) times daily as needed for anxiety or sleep.   aspirin EC 81 MG tablet Take 81 mg by mouth daily.   BREO ELLIPTA 100-25 MCG/INH Aepb Generic drug:  fluticasone furoate-vilanterol Inhale 1 puff into the lungs daily.   ENTRESTO 49-51 MG Generic drug:  sacubitril-valsartan Take 1 tablet by mouth 2 (two) times daily.   furosemide 80 MG tablet Commonly known as:  LASIX Take 1 tablet (80 mg total) by mouth 2 (two) times daily.   HYDROcodone-acetaminophen 10-325 MG tablet Commonly known as:  NORCO Take 1 tablet by mouth every 8 (eight) hours as needed for moderate pain.   multivitamin with minerals Tabs tablet Take 1 tablet by mouth daily.   NITROSTAT 0.4 MG SL tablet Generic drug:  nitroGLYCERIN Take 0.4 mg by mouth every 5 (five) minutes x 3 doses as needed for chest pain.   omeprazole 20 MG capsule Commonly known as:  PRILOSEC Take 20 mg by mouth 2 (two) times daily.   tiotropium 18 MCG inhalation capsule Commonly known as:  SPIRIVA HANDIHALER Place 1 capsule (18 mcg total) into inhaler and inhale daily.       Allergies:  Allergies  Allergen Reactions  . Lisinopril Anaphylaxis  . Metoprolol Other (See Comments)    Reaction:  Ringing in ears   . Gabapentin Rash    Family History: Family History  Problem Relation Age of Onset  . Heart attack Mother   . Stroke Mother   . Cancer Father   . COPD Father   . Heart attack Father     Social History:  reports that he has been smoking Cigarettes.  He has a 11.25 pack-year smoking history. He has never used smokeless tobacco. He reports that he does not drink alcohol or use drugs.  ROS: UROLOGY Frequent Urination?: Yes Hard to  postpone urination?: Yes Burning/pain with urination?: Yes Get up at night to urinate?: No Leakage of urine?: No Urine stream starts and stops?: Yes Trouble starting stream?: No Do you have to strain to urinate?: No Blood in urine?: No Urinary tract infection?: Yes Sexually transmitted disease?: No Injury to kidneys or bladder?: No Painful intercourse?: No Weak stream?: No Erection problems?: No Penile pain?: No  Gastrointestinal Nausea?: No Vomiting?: No Indigestion/heartburn?: Yes Diarrhea?: No Constipation?: No  Constitutional Fever: No Night sweats?: No Weight loss?: No Fatigue?: No  Skin Skin rash/lesions?: No Itching?: Yes  Eyes Blurred vision?: No Double vision?: No  Ears/Nose/Throat Sore throat?: No Sinus problems?: No  Hematologic/Lymphatic Swollen glands?: No Easy bruising?: Yes  Cardiovascular Leg swelling?: Yes Chest pain?: No  Respiratory Cough?: Yes Shortness of breath?: No  Endocrine Excessive thirst?: Yes  Musculoskeletal Back pain?: Yes Joint pain?: No  Neurological Headaches?: No  Dizziness?: No  Psychologic Depression?: No Anxiety?: Yes  Physical Exam: BP 125/70   Pulse 72   Ht 6\' 2"  (1.88 m)   Wt 170 lb (77.1 kg)   BMI 21.83 kg/m  O2 sat 95% on room air. Constitutional:  Alert and oriented, No acute distress.  Sitting in wheelchair.. Wife is present with him today.   Clinically, he appears the best that I seen him today then compared to all previous encounters. HEENT: [place] AT, moist mucus membranes.  Trachea midline, no masses. Cardiovascular: No clubbing, cyanosis.   Respiratory: Normal respiratory effort, no increased work of breathing. GI: Abdomen is soft, nontender, nondistended, no abdominal masses. GU: No CVA tenderness. Right nephrostomy tube clear. Site clean dry and intact. Skin: No rashes, bruises or suspicious lesions.. MSK: left leg surgically absent. Neurologic: Grossly intact, no focal deficits, moving  all 4 extremities. Psychiatric: Normal mood and affect.  Laboratory Data: Lab Results  Component Value Date   WBC 9.6 12/08/2015   HGB 11.2 (L) 12/08/2015   HCT 36.5 (L) 12/08/2015   MCV 74.4 (L) 12/08/2015   PLT 188 12/08/2015    Lab Results  Component Value Date   CREATININE 1.59 (H) 12/09/2015    Lab Results  Component Value Date   HGBA1C 6.7 (H) 12/09/2015    Pertinent Imaging: CT abdomen and pelvis without IV contrast  Comparison: None.  Indication: KIDNEY STONE, , N20.0 Calculus of kidney, N21.1 Calculus in urethra.  Technique:CT imaging from the level of the kidneys to the pelvis was performed without intravenous or oral contrast. The patient was scanned in the prone position. Coronal reformatted images were generated and reviewed to assist with anatomic localization and lesion detection.  Findings: Pleural effusions are present, bilaterally. A right pleural drain is noted.  There are scattered subcentimeter low-attenuation lesions in the liver, too small to accurately characterize, but likely cysts (series 2, image 42 and image 54). No biliary ductal dilatation. The spleen, pancreas, adrenal glands are normal.   A right-sided nephrostomy tube and nephroureteral stent are present. There is a 2.3 x 1.0 x 2.1 cm calculus in the upper pole of the right kidney (series 2, image 50). No right hydronephrosis. Stone debris around the ureteral stent in the distal right ureter (series 2, image 105). There is a 1.3 x 1.1 x 1.0 cm calculus within the left renal pelvis. Moderate left hydroureteronephrosis with diffuse urothelial thickening. Left interpolar renal cyst (series 2, image 39). There is a 0.9 x 0.6 x 0.9 centimeter urinary bladder calculus around the loop of the ureteral stent (series 2, image 126).  No bowel wall thickening or dilatation. Normal appendix. No mesenteric or retroperitoneal lymphadenopathy. Aortic graft is present.  Atherosclerosis without aneurysm.  Small volume ascites. No pelvic masses or lymphadenopathy.  Degenerative changes of the spine. No destructive osseous lesions.  Impression: 1. Nephrolithiasis, bilaterally. Right-sided percutaneous nephrostomy tube and nephroureteral stent are present. There is stone debris within the distal right ureter adjacent to the ureteral stent, but no right hydroureteronephrosis. 2. Calculus within the left renal pelvis with mild left hydronephrosis and diffuse urothelial thickening, possibly inflammatory. Correlate with urinalysis. 3. Urinary bladder calculus around the loop of the ureteral stent.  Electronically Signed [id number] Zenia Resides, MD Electronically Signed on:02/10/2016 11:31 AM  Report for above CT was reviewed today however unable to review images as this was performed at an outside hospital.   Assessment & Plan:    1. Hydronephrosis with urinary obstruction due to renal calculus  We  had a lengthy discussion today again regarding whether or not to proceed with staged right ureteroscopy with the goal of removing his stent in treating his right-sided stone burden vs. Continuation of management with PCN tube.  He is very bothered by the pain from his stent and the nephrostomy tube itself.  Clinically, he appears better today than I have ever seen him look on any previous encounter including hospitalizations and as an outpatient.  I do think he will require multiple procedures and I reiterated today that he ishigh risk for anesthesia and postoperative complications.  Reviewed this carefully in detail today. He understands that he is at risk for sepsis with extensive complications including death. He understands all this.  We'll plan to go ahead and exchange his right nephrostomy tube as he is overdue for this.  In the meantime, he would also like to be scheduled for the first stage of several right-sided ureteroscopic procedures. Primary goal would be  to dislodge the stent and unobstruct the kidney.  - CULTURE, URINE COMPREHENSIVE (preop, from right nephrostomy tube) and UCx from bladder -preop BMP/ CBC -cardiac and pulmonary optimization, clearance needed from cardiology -OK to stay on ASA 81 mg  2. Retained ureteral stent As above   3. Acute on chronic renal failure (HCC) Recheck BMP preop  4. Candida UTI Colonized.  Plan for preop triple abx including diflucan.     Hollice Espy, MD  [place] [address] [place], [place] [address] [phone]  I spent 25  min with this patient of which greater than 50% was spent in counseling and coordination of care with the patient. All he and his wife's questions were answered in detail.

## 2016-06-12 DIAGNOSIS — F4323 Adjustment disorder with mixed anxiety and depressed mood: Secondary | ICD-10-CM | POA: Diagnosis not present

## 2016-06-12 DIAGNOSIS — I10 Essential (primary) hypertension: Secondary | ICD-10-CM | POA: Diagnosis not present

## 2016-06-12 DIAGNOSIS — J431 Panlobular emphysema: Secondary | ICD-10-CM | POA: Diagnosis not present

## 2016-06-12 DIAGNOSIS — I5023 Acute on chronic systolic (congestive) heart failure: Secondary | ICD-10-CM | POA: Diagnosis not present

## 2016-06-12 NOTE — Telephone Encounter (Signed)
Notified pt's wife of appt with Dr Ubaldo Glassing for surgical clearance on 06/12/16 @1 :73. Also notified pt's wife of right nephrostomy tube exchange scheduled 06/21/16 with arrival time to [facility] registration desk at 8:00. Advised that pt should be npo after mn except to take entresto with a sip of water & may use inhalers morning of procedure. Phyllis voices understanding.

## 2016-06-12 NOTE — Telephone Encounter (Signed)
LMOM

## 2016-06-13 ENCOUNTER — Other Ambulatory Visit: Payer: Self-pay | Admitting: Radiology

## 2016-06-13 DIAGNOSIS — Z960 Presence of urogenital implants: Secondary | ICD-10-CM

## 2016-06-13 NOTE — Telephone Encounter (Signed)
Notified wife, Nochum Akridge, of surgery scheduled with Dr Erlene Quan on 07/10/16, pre-admit testing appt on 06/26/16 @10 :00 & to call Friday prior to surgery for arrival time to [facility]. Phyllis voices understanding.

## 2016-06-13 NOTE — Telephone Encounter (Signed)
LMOM. Need to notify pt of surgery information. 

## 2016-06-16 ENCOUNTER — Telehealth: Payer: Self-pay | Admitting: Urology

## 2016-06-16 LAB — CULTURE, URINE COMPREHENSIVE

## 2016-06-16 NOTE — Telephone Encounter (Signed)
Called Dr. Ola Spurr to discuss this complex case. Preoperative urine culture from his nephrostomy tube is growing bank resistant enterococcus resistant to all medications tested as well as candida.    [employer] contacted out on sensitivities to Linazolid and and daptomycin.  Dr. Ola Spurr has agreed to see the patient for preoperative assessment and preoperative antibiotics to decrease his bacterial/fungal load prior to surgery.  Hollice Espy, MD

## 2016-06-21 ENCOUNTER — Ambulatory Visit
Admission: RE | Admit: 2016-06-21 | Discharge: 2016-06-21 | Disposition: A | Discharge status: Home / Self Care | Payer: Medicare Other | Source: Ambulatory Visit | Attending: Urology | Admitting: Urology

## 2016-06-21 ENCOUNTER — Other Ambulatory Visit (HOSPITAL_COMMUNITY): Payer: Self-pay | Admitting: Interventional Radiology

## 2016-06-21 DIAGNOSIS — N135 Crossing vessel and stricture of ureter without hydronephrosis: Secondary | ICD-10-CM | POA: Diagnosis not present

## 2016-06-21 DIAGNOSIS — Z960 Presence of urogenital implants: Secondary | ICD-10-CM | POA: Diagnosis not present

## 2016-06-21 DIAGNOSIS — Z436 Encounter for attention to other artificial openings of urinary tract: Secondary | ICD-10-CM | POA: Diagnosis not present

## 2016-06-21 HISTORY — PX: IR GENERIC HISTORICAL: IMG1180011

## 2016-06-21 MED ORDER — LIDOCAINE HCL (PF) 1 % IJ SOLN
INTRAMUSCULAR | Status: AC
  Filled 2016-06-21: qty 30

## 2016-06-21 MED ORDER — IOPAMIDOL (ISOVUE-300) INJECTION 61%
30.0000 mL | Freq: Once | INTRAVENOUS | Status: AC | PRN
  Administered 2016-06-21: 5 mL

## 2016-06-21 MED ORDER — HYDROCODONE-ACETAMINOPHEN 5-325 MG PO TABS
2.0000 | ORAL_TABLET | Freq: Once | ORAL | Status: AC
  Administered 2016-06-21: 2 via ORAL
  Filled 2016-06-21: qty 2

## 2016-06-21 MED ORDER — HYDROCODONE-ACETAMINOPHEN 10-325 MG PO TABS: 2.0000 | ORAL_TABLET | Freq: Once | ORAL | Status: DC

## 2016-06-21 NOTE — Procedures (Signed)
Successful right sided PCN exchange.   EBL: None No immediate complications.   Ronny Bacon, MD Pager #: [phone]

## 2016-06-21 NOTE — OR Nursing (Signed)
Per Dr Pascal Lux, no IV or antibiotic

## 2016-06-25 LAB — MIN INHIBITORY CONC (2 DRUGS)

## 2016-06-25 LAB — SPECIMEN STATUS REPORT

## 2016-06-26 ENCOUNTER — Other Ambulatory Visit: Payer: Medicare Other

## 2016-06-26 DIAGNOSIS — Z1621 Resistance to vancomycin: Secondary | ICD-10-CM | POA: Diagnosis not present

## 2016-06-26 DIAGNOSIS — B3749 Other urogenital candidiasis: Secondary | ICD-10-CM | POA: Diagnosis not present

## 2016-06-26 DIAGNOSIS — N390 Urinary tract infection, site not specified: Secondary | ICD-10-CM | POA: Diagnosis not present

## 2016-06-26 DIAGNOSIS — F4323 Adjustment disorder with mixed anxiety and depressed mood: Secondary | ICD-10-CM | POA: Diagnosis not present

## 2016-06-26 DIAGNOSIS — A491 Streptococcal infection, unspecified site: Secondary | ICD-10-CM | POA: Diagnosis not present

## 2016-06-26 NOTE — Telephone Encounter (Signed)
Pt's wife called to r/s pre-admit appt. Appt r/s'd to 10/19 @12 :00. Wife aware.

## 2016-06-29 ENCOUNTER — Other Ambulatory Visit: Payer: Medicare Other

## 2016-06-29 ENCOUNTER — Encounter
Admission: RE | Admit: 2016-06-29 | Discharge: 2016-06-29 | Disposition: A | Discharge status: Home / Self Care | Payer: Medicare Other | Source: Ambulatory Visit | Attending: Urology | Admitting: Urology

## 2016-06-29 DIAGNOSIS — Z01818 Encounter for other preprocedural examination: Secondary | ICD-10-CM | POA: Diagnosis not present

## 2016-06-29 DIAGNOSIS — R05 Cough: Secondary | ICD-10-CM | POA: Diagnosis not present

## 2016-06-29 HISTORY — DX: Unspecified osteoarthritis, unspecified site: M19.90

## 2016-06-29 HISTORY — DX: Spinal stenosis, lumbar region without neurogenic claudication: M48.061

## 2016-06-29 HISTORY — DX: Reserved for concepts with insufficient information to code with codable children: IMO0002

## 2016-06-29 HISTORY — DX: Anxiety disorder, unspecified: F41.9

## 2016-06-29 HISTORY — DX: Dependence on supplemental oxygen: Z99.81

## 2016-06-29 HISTORY — DX: Anemia, unspecified: D64.9

## 2016-06-29 HISTORY — DX: Unspecified asthma, uncomplicated: J45.909

## 2016-06-29 LAB — BASIC METABOLIC PANEL
Anion gap: 8 (ref 5–15)
BUN: 28 mg/dL — AB (ref 6–20)
CHLORIDE: 109 mmol/L (ref 101–111)
CO2: 21 mmol/L — ABNORMAL LOW (ref 22–32)
Calcium: 9.3 mg/dL (ref 8.9–10.3)
Creatinine, Ser: 1.57 mg/dL — ABNORMAL HIGH (ref 0.61–1.24)
GFR calc non Af Amer: 42 mL/min — ABNORMAL LOW (ref >60)
GFR, EST AFRICAN AMERICAN: 49 mL/min — AB (ref >60)
Glucose, Bld: 101 mg/dL — ABNORMAL HIGH (ref 65–99)
POTASSIUM: 4.9 mmol/L (ref 3.5–5.1)
SODIUM: 138 mmol/L (ref 135–145)

## 2016-06-29 LAB — CBC
HEMATOCRIT: 40.1 % (ref 40.0–52.0)
HEMOGLOBIN: 12.8 g/dL — AB (ref 13.0–18.0)
MCH: 23.1 pg — ABNORMAL LOW (ref 26.0–34.0)
MCHC: 32 g/dL (ref 32.0–36.0)
MCV: 72.3 fL — AB (ref 80.0–100.0)
Platelets: 265 10*3/uL (ref 150–440)
RBC: 5.54 MIL/uL (ref 4.40–5.90)
RDW: 22.4 % — ABNORMAL HIGH (ref 11.5–14.5)
WBC: 14.1 10*3/uL — AB (ref 3.8–10.6)

## 2016-06-29 LAB — SURGICAL PCR SCREEN
MRSA, PCR: NEGATIVE
Staphylococcus aureus: NEGATIVE

## 2016-06-29 NOTE — Pre-Procedure Instructions (Signed)
EKG from December 07, 2015 reviewed by Kerin Perna (anesthesia nurse) and stated ok.

## 2016-06-29 NOTE — Pre-Procedure Instructions (Signed)
Cardiac clearance received from Dr. Ubaldo Glassing and on chart.

## 2016-06-29 NOTE — Patient Instructions (Signed)
  Your procedure is scheduled on: July 10, 2016 (Monday )Report to Same Day Surgery 2nd floor Medical Mall To find out your arrival time please call [phone] between 1PM - 3PM on July 07, 2016 (Friday)  Remember: Instructions that are not followed completely may result in serious medical risk, up to and including death, or upon the discretion of your surgeon and anesthesiologist your surgery may need to be rescheduled.    _x___ 1. Do not eat food or drink liquids after midnight. No gum chewing or hard candies.     __x__ 2. No Alcohol for 24 hours before or after surgery.   __x__3. No Smoking for 24 prior to surgery.   ____  4. Bring all medications with you on the day of surgery if instructed.    __x__ 5. Notify your doctor if there is any change in your medical condition     (cold, fever, infections).     Do not wear jewelry, make-up, hairpins, clips or nail polish.  Do not wear lotions, powders, or perfumes. You may wear deodorant.  Do not shave 48 hours prior to surgery. Men may shave face and neck.  Do not bring valuables to the hospital.    [facility] is not responsible for any belongings or valuables.               Contacts, dentures or bridgework may not be worn into surgery.  Leave your suitcase in the car. After surgery it may be brought to your room.  For patients admitted to the hospital, discharge time is determined by your treatment team.   Patients discharged the day of surgery will not be allowed to drive home.    Please read over the following fact sheets that you were given:   [facility] Preparing for Surgery and or MRSA Information   _x___ Take these medicines the morning of surgery with A SIP OF WATER:    1. Bisoprolol  2. Omeprazole  3. Eplerenone  4. Sacubitril-Valsartan  5.  6.  ____Fleets enema or Magnesium Citrate as directed.   _x___ Use CHG Soap or sage wipes as directed on instruction sheet   _x___ Use inhalers on the day of  surgery and bring to hospital day of surgery (Use Pro-Air, Breo Ellipta, Advair inhalers and Spiriva the morning of surgery and bring inhalers to hospital)  ____ Stop metformin 2 days prior to surgery    ____ Take 1/2 of usual insulin dose the night before surgery and none on the morning of           surgery.   _x_ Stop aspirin or coumadin, or plavix (STOP ASPIRIN AS DR. BRANDON INSTRUCTED--the day before surgery)   x__ Stop Anti-inflammatories such as Advil, Aleve, Ibuprofen, Motrin, Naproxen,          Naprosyn, Goodies powders or aspirin products. Ok to take Tylenol.   ____ Stop supplements until after surgery.    ____ Bring C-Pap to the hospital.

## 2016-07-03 DIAGNOSIS — N189 Chronic kidney disease, unspecified: Secondary | ICD-10-CM | POA: Diagnosis not present

## 2016-07-03 LAB — URINE CULTURE: Culture: >=100000 — AB

## 2016-07-03 NOTE — Telephone Encounter (Signed)
Hollice Espy, MD  Venice Marcucci Linton Flemings, RN        Yes, he is high risk and I'd prefer to have a negative culture. Can we move him back just a few days?   Hollice Espy, MD   Previous Messages    ----- Message -----  From: Ranell Patrick, RN  Sent: 07/03/2016  2:13 PM  To: Hollice Espy, MD   Per Theadora Rama at Dr Blane Ohara office, pt is now admitted to [facility]. Dr Ola Spurr has ordered fosfomycin 3g po qod & has requested a repeat ucx to be done at the facility following antibiotic. Theadora Rama states results will not be available prior to surgery scheduled 07/10/16 & that this is their last option before using IV abx. Dr Ola Spurr asks if you have any other recommendations? Would you like me to find a new date for surgery?   ----- Message -----  From: Hollice Espy, MD  Sent: 07/03/2016 12:40 PM  To: [place] Chelf Linton Flemings, RN   Can you make sure Ola Spurr gets this result?   Hollice Espy, MD    ----- Message -----  From: Interface, Lab In [place]  Sent: 06/29/2016  1:45 PM  To: Hollice Espy, MD

## 2016-07-03 NOTE — Pre-Procedure Instructions (Signed)
Urine culture results faxed and called to Dr. Erlene Quan office (spoke to Amy).

## 2016-07-03 NOTE — Telephone Encounter (Signed)
Notified pt's wife, Silva Bandy, of change in surgery date to 07/17/16. Advised that pt should follow all instructions that were given at pre-admit testing appt. Phyllis voices understanding.

## 2016-07-10 DIAGNOSIS — I110 Hypertensive heart disease with heart failure: Secondary | ICD-10-CM | POA: Diagnosis not present

## 2016-07-10 DIAGNOSIS — N189 Chronic kidney disease, unspecified: Secondary | ICD-10-CM | POA: Diagnosis not present

## 2016-07-10 DIAGNOSIS — N390 Urinary tract infection, site not specified: Secondary | ICD-10-CM | POA: Diagnosis not present

## 2016-07-10 DIAGNOSIS — G8929 Other chronic pain: Secondary | ICD-10-CM | POA: Diagnosis not present

## 2016-07-10 DIAGNOSIS — I739 Peripheral vascular disease, unspecified: Secondary | ICD-10-CM | POA: Diagnosis not present

## 2016-07-10 DIAGNOSIS — J449 Chronic obstructive pulmonary disease, unspecified: Secondary | ICD-10-CM | POA: Diagnosis not present

## 2016-07-10 DIAGNOSIS — I509 Heart failure, unspecified: Secondary | ICD-10-CM | POA: Diagnosis not present

## 2016-07-10 DIAGNOSIS — F418 Other specified anxiety disorders: Secondary | ICD-10-CM | POA: Diagnosis not present

## 2016-07-10 DIAGNOSIS — E785 Hyperlipidemia, unspecified: Secondary | ICD-10-CM | POA: Diagnosis not present

## 2016-07-14 NOTE — Telephone Encounter (Signed)
Notified pt's wife that pt needs to RTC for repeat ucx prior to surgery & that surgery will be rescheduled to a later date once results are available. Phyllis voices understanding.

## 2016-07-17 ENCOUNTER — Encounter: Payer: Self-pay | Admitting: Urology

## 2016-07-17 ENCOUNTER — Ambulatory Visit: Payer: Medicare Other

## 2016-07-21 ENCOUNTER — Ambulatory Visit: Payer: Medicare Other | Admitting: Family Medicine

## 2016-07-21 DIAGNOSIS — N132 Hydronephrosis with renal and ureteral calculous obstruction: Secondary | ICD-10-CM

## 2016-07-21 LAB — MICROSCOPIC EXAMINATION
Epithelial Cells (non renal): NONE SEEN /hpf (ref 0–10)
RBC, UA: >30 /hpf — AB (ref 0–?)
WBC, UA: >30 /hpf — AB (ref 0–?)

## 2016-07-21 LAB — URINALYSIS, COMPLETE
Bilirubin, UA: NEGATIVE
GLUCOSE, UA: NEGATIVE
KETONES UA: NEGATIVE
Nitrite, UA: POSITIVE — AB
SPEC GRAV UA: 1.02 (ref 1.005–1.030)
Urobilinogen, Ur: 0.2 mg/dL (ref 0.2–1.0)
pH, UA: 7 (ref 5.0–7.5)

## 2016-07-21 NOTE — Progress Notes (Signed)
Patient was seen today for UA Culture from Nephrostomy tube and a culture from the bladder. A 10cc syringe was placed on the tube and 5cc was drained for culture. Patient gave a urine specimen and it was sent for culture.

## 2016-07-24 ENCOUNTER — Telehealth: Payer: Self-pay | Admitting: Urology

## 2016-07-24 LAB — CULTURE, URINE COMPREHENSIVE

## 2016-07-24 NOTE — Telephone Encounter (Signed)
Discussed case today with Dr. Ola Spurr. It does not appear that the fosfomycin was effective in clearing his urinary tract infection.  He is now growing Citrobacter which is resistant to oral antibiotics.  We discussed that given that the patient has an indwelling foreign bodies, we will not be able to clear him of his bacturia, however can significantly reduce his bacterial load and thus his risk of sepsis. Dr. Ola Spurr will arrange for IV PICC line and IV antibiotics starting approximately one week before surgery and he will remain on this for a period postoperatively as well.  Hollice Espy, MD

## 2016-07-25 LAB — CULTURE, URINE COMPREHENSIVE

## 2016-07-27 ENCOUNTER — Telehealth: Payer: Self-pay | Admitting: Radiology

## 2016-07-27 NOTE — Telephone Encounter (Signed)
Pt's wife called stating pt c/o right lower back & kidney pain & burning with urination. States pain is unbearable. Pt has been taking Tylenol 500mg  x 3 tablets every 4 hours without relief & can't take NSAIDS d/t kidney function. Advised pt that this dose is too strong to be taking that frequently. He will be getting a PICC placed on 08/08/16 for IV antibiotics with surgery on 08/15/16. Please advise.

## 2016-07-27 NOTE — Telephone Encounter (Signed)
Wife states pt's temp is 99.8 with a normal baseline of 97.5. Per Dr Erlene Quan advised pt to either go to the emergency room or call Dr Ola Spurr to see if PICC placement can be moved up so antibiotics can start sooner. Wife states she will try to get him to go to the ER but pt is reluctant to go. Again encouraged pt to only take Tylenol as directed on the bottle. Wife voices understanding.

## 2016-07-28 DIAGNOSIS — J431 Panlobular emphysema: Secondary | ICD-10-CM | POA: Diagnosis not present

## 2016-07-28 DIAGNOSIS — G8929 Other chronic pain: Secondary | ICD-10-CM | POA: Diagnosis not present

## 2016-07-28 DIAGNOSIS — R0609 Other forms of dyspnea: Secondary | ICD-10-CM | POA: Diagnosis not present

## 2016-07-28 DIAGNOSIS — J90 Pleural effusion, not elsewhere classified: Secondary | ICD-10-CM | POA: Diagnosis not present

## 2016-07-28 DIAGNOSIS — M542 Cervicalgia: Secondary | ICD-10-CM | POA: Diagnosis not present

## 2016-08-02 DIAGNOSIS — I5023 Acute on chronic systolic (congestive) heart failure: Secondary | ICD-10-CM | POA: Diagnosis not present

## 2016-08-02 DIAGNOSIS — N183 Chronic kidney disease, stage 3 (moderate): Secondary | ICD-10-CM | POA: Diagnosis not present

## 2016-08-02 DIAGNOSIS — F17200 Nicotine dependence, unspecified, uncomplicated: Secondary | ICD-10-CM | POA: Diagnosis not present

## 2016-08-02 DIAGNOSIS — N200 Calculus of kidney: Secondary | ICD-10-CM | POA: Diagnosis not present

## 2016-08-07 DIAGNOSIS — J90 Pleural effusion, not elsewhere classified: Secondary | ICD-10-CM | POA: Diagnosis not present

## 2016-08-08 ENCOUNTER — Ambulatory Visit: Payer: Medicare Other

## 2016-08-09 NOTE — Telephone Encounter (Signed)
LMOM

## 2016-08-09 NOTE — Telephone Encounter (Signed)
Per Theadora Rama at Dr Blane Ohara office, pt cancelled picc placement on 08/08/16. Multiple messages left at both pt & wife's phone numbers without return call. Need to discuss rescheduling picc placement prior to surgery per Dr Erlene Quan.

## 2016-08-10 NOTE — Telephone Encounter (Signed)
LMOM

## 2016-08-14 ENCOUNTER — Other Ambulatory Visit: Payer: Self-pay | Admitting: Radiology

## 2016-08-14 NOTE — Telephone Encounter (Signed)
Pt's wife, Silva Bandy called to reschedule surgery. Notified her of surgery scheduled 08/29/16 with Dr Erlene Quan, pre-admit testing appt scheduled 08/18/16 @10 :30 & that they will be contacted by either Brandy from Dr Blane Ohara office or myself regarding PICC placement & IV antibiotics prior to surgery. Reiterated that this appt must be kept in order to facilitate surgery. Phyllis voices understanding.

## 2016-08-15 ENCOUNTER — Encounter: Admission: RE | Payer: Self-pay | Source: Ambulatory Visit

## 2016-08-15 ENCOUNTER — Other Ambulatory Visit: Payer: Self-pay | Admitting: Radiology

## 2016-08-15 ENCOUNTER — Ambulatory Visit: Admission: RE | Admit: 2016-08-15 | Payer: Medicare Other | Source: Ambulatory Visit | Admitting: Urology

## 2016-08-15 DIAGNOSIS — Z960 Presence of urogenital implants: Secondary | ICD-10-CM

## 2016-08-15 SURGERY — URETEROSCOPY, WITH LITHOTRIPSY USING HOLMIUM LASER
Anesthesia: Choice | Laterality: Right

## 2016-08-15 NOTE — Telephone Encounter (Signed)
LMOM. Need to notify pt of PICC placement appt.

## 2016-08-17 ENCOUNTER — Encounter: Payer: Self-pay | Admitting: Radiology

## 2016-08-17 NOTE — Telephone Encounter (Signed)
Multiple messages left on wife's cell number without return call. Mailed letter instructing pt of PICC placement scheduled on 08/22/16 @10 :00.

## 2016-08-18 ENCOUNTER — Inpatient Hospital Stay: Admission: RE | Admit: 2016-08-18 | Payer: Medicare Other | Source: Ambulatory Visit

## 2016-08-22 NOTE — Telephone Encounter (Signed)
LMOM [facility]

## 2016-08-23 ENCOUNTER — Encounter: Payer: Self-pay | Admitting: Radiology

## 2016-08-29 ENCOUNTER — Ambulatory Visit: Admission: RE | Admit: 2016-08-29 | Payer: Medicare Other | Source: Ambulatory Visit | Admitting: Urology

## 2016-08-29 ENCOUNTER — Encounter: Admission: RE | Payer: Self-pay | Source: Ambulatory Visit

## 2016-08-29 SURGERY — URETEROSCOPY, WITH LITHOTRIPSY USING HOLMIUM LASER
Anesthesia: Choice | Laterality: Right

## 2016-09-08 DIAGNOSIS — J90 Pleural effusion, not elsewhere classified: Secondary | ICD-10-CM | POA: Diagnosis not present

## 2016-09-09 ENCOUNTER — Inpatient Hospital Stay
Admission: EM | Admit: 2016-09-09 | Discharge: 2016-09-15 | DRG: 872 | Discharge status: Against Medical Advice | Payer: Medicare Other | Attending: Internal Medicine | Admitting: Internal Medicine

## 2016-09-09 ENCOUNTER — Encounter: Payer: Self-pay | Admitting: Emergency Medicine

## 2016-09-09 ENCOUNTER — Emergency Department: Payer: Medicare Other

## 2016-09-09 DIAGNOSIS — Z79899 Other long term (current) drug therapy: Secondary | ICD-10-CM | POA: Diagnosis not present

## 2016-09-09 DIAGNOSIS — Z936 Other artificial openings of urinary tract status: Secondary | ICD-10-CM

## 2016-09-09 DIAGNOSIS — Z9981 Dependence on supplemental oxygen: Secondary | ICD-10-CM | POA: Diagnosis not present

## 2016-09-09 DIAGNOSIS — I5022 Chronic systolic (congestive) heart failure: Secondary | ICD-10-CM | POA: Diagnosis present

## 2016-09-09 DIAGNOSIS — I10 Essential (primary) hypertension: Secondary | ICD-10-CM | POA: Diagnosis present

## 2016-09-09 DIAGNOSIS — Z9889 Other specified postprocedural states: Secondary | ICD-10-CM

## 2016-09-09 DIAGNOSIS — Z8249 Family history of ischemic heart disease and other diseases of the circulatory system: Secondary | ICD-10-CM

## 2016-09-09 DIAGNOSIS — I509 Heart failure, unspecified: Secondary | ICD-10-CM | POA: Diagnosis not present

## 2016-09-09 DIAGNOSIS — E785 Hyperlipidemia, unspecified: Secondary | ICD-10-CM | POA: Diagnosis present

## 2016-09-09 DIAGNOSIS — R0600 Dyspnea, unspecified: Secondary | ICD-10-CM | POA: Diagnosis not present

## 2016-09-09 DIAGNOSIS — N401 Enlarged prostate with lower urinary tract symptoms: Secondary | ICD-10-CM | POA: Diagnosis not present

## 2016-09-09 DIAGNOSIS — Z89612 Acquired absence of left leg above knee: Secondary | ICD-10-CM

## 2016-09-09 DIAGNOSIS — Z79891 Long term (current) use of opiate analgesic: Secondary | ICD-10-CM | POA: Diagnosis not present

## 2016-09-09 DIAGNOSIS — N1339 Other hydronephrosis: Secondary | ICD-10-CM | POA: Diagnosis not present

## 2016-09-09 DIAGNOSIS — Z8679 Personal history of other diseases of the circulatory system: Secondary | ICD-10-CM

## 2016-09-09 DIAGNOSIS — R05 Cough: Secondary | ICD-10-CM | POA: Diagnosis not present

## 2016-09-09 DIAGNOSIS — R0603 Acute respiratory distress: Secondary | ICD-10-CM | POA: Diagnosis not present

## 2016-09-09 DIAGNOSIS — N183 Chronic kidney disease, stage 3 (moderate): Secondary | ICD-10-CM | POA: Diagnosis present

## 2016-09-09 DIAGNOSIS — N179 Acute kidney failure, unspecified: Secondary | ICD-10-CM | POA: Diagnosis present

## 2016-09-09 DIAGNOSIS — N136 Pyonephrosis: Secondary | ICD-10-CM | POA: Diagnosis not present

## 2016-09-09 DIAGNOSIS — I739 Peripheral vascular disease, unspecified: Secondary | ICD-10-CM | POA: Diagnosis present

## 2016-09-09 DIAGNOSIS — A419 Sepsis, unspecified organism: Secondary | ICD-10-CM | POA: Diagnosis present

## 2016-09-09 DIAGNOSIS — F419 Anxiety disorder, unspecified: Secondary | ICD-10-CM | POA: Diagnosis present

## 2016-09-09 DIAGNOSIS — J449 Chronic obstructive pulmonary disease, unspecified: Secondary | ICD-10-CM | POA: Diagnosis present

## 2016-09-09 DIAGNOSIS — Z7982 Long term (current) use of aspirin: Secondary | ICD-10-CM

## 2016-09-09 DIAGNOSIS — Z5329 Procedure and treatment not carried out because of patient's decision for other reasons: Secondary | ICD-10-CM | POA: Diagnosis present

## 2016-09-09 DIAGNOSIS — N1330 Unspecified hydronephrosis: Secondary | ICD-10-CM | POA: Diagnosis not present

## 2016-09-09 DIAGNOSIS — H35039 Hypertensive retinopathy, unspecified eye: Secondary | ICD-10-CM | POA: Diagnosis present

## 2016-09-09 DIAGNOSIS — N200 Calculus of kidney: Secondary | ICD-10-CM | POA: Diagnosis not present

## 2016-09-09 DIAGNOSIS — E875 Hyperkalemia: Secondary | ICD-10-CM | POA: Diagnosis present

## 2016-09-09 DIAGNOSIS — E872 Acidosis: Secondary | ICD-10-CM | POA: Diagnosis present

## 2016-09-09 DIAGNOSIS — Z825 Family history of asthma and other chronic lower respiratory diseases: Secondary | ICD-10-CM | POA: Diagnosis not present

## 2016-09-09 DIAGNOSIS — J9611 Chronic respiratory failure with hypoxia: Secondary | ICD-10-CM | POA: Diagnosis present

## 2016-09-09 DIAGNOSIS — K219 Gastro-esophageal reflux disease without esophagitis: Secondary | ICD-10-CM | POA: Diagnosis present

## 2016-09-09 DIAGNOSIS — N359 Urethral stricture, unspecified: Secondary | ICD-10-CM | POA: Diagnosis present

## 2016-09-09 DIAGNOSIS — J9612 Chronic respiratory failure with hypercapnia: Secondary | ICD-10-CM | POA: Diagnosis present

## 2016-09-09 DIAGNOSIS — I714 Abdominal aortic aneurysm, without rupture: Secondary | ICD-10-CM | POA: Diagnosis present

## 2016-09-09 DIAGNOSIS — N358 Other urethral stricture: Secondary | ICD-10-CM | POA: Diagnosis not present

## 2016-09-09 DIAGNOSIS — A408 Other streptococcal sepsis: Secondary | ICD-10-CM | POA: Diagnosis not present

## 2016-09-09 DIAGNOSIS — N132 Hydronephrosis with renal and ureteral calculous obstruction: Secondary | ICD-10-CM | POA: Diagnosis present

## 2016-09-09 DIAGNOSIS — Z7951 Long term (current) use of inhaled steroids: Secondary | ICD-10-CM

## 2016-09-09 DIAGNOSIS — J441 Chronic obstructive pulmonary disease with (acute) exacerbation: Secondary | ICD-10-CM | POA: Diagnosis present

## 2016-09-09 DIAGNOSIS — R0602 Shortness of breath: Secondary | ICD-10-CM | POA: Diagnosis not present

## 2016-09-09 DIAGNOSIS — I130 Hypertensive heart and chronic kidney disease with heart failure and stage 1 through stage 4 chronic kidney disease, or unspecified chronic kidney disease: Secondary | ICD-10-CM | POA: Diagnosis present

## 2016-09-09 DIAGNOSIS — F17210 Nicotine dependence, cigarettes, uncomplicated: Secondary | ICD-10-CM | POA: Diagnosis present

## 2016-09-09 DIAGNOSIS — J90 Pleural effusion, not elsewhere classified: Secondary | ICD-10-CM | POA: Diagnosis present

## 2016-09-09 DIAGNOSIS — Z9119 Patient's noncompliance with other medical treatment and regimen: Secondary | ICD-10-CM

## 2016-09-09 DIAGNOSIS — N320 Bladder-neck obstruction: Secondary | ICD-10-CM | POA: Diagnosis present

## 2016-09-09 DIAGNOSIS — M48061 Spinal stenosis, lumbar region without neurogenic claudication: Secondary | ICD-10-CM | POA: Diagnosis present

## 2016-09-09 DIAGNOSIS — Z809 Family history of malignant neoplasm, unspecified: Secondary | ICD-10-CM

## 2016-09-09 DIAGNOSIS — M1990 Unspecified osteoarthritis, unspecified site: Secondary | ICD-10-CM | POA: Diagnosis present

## 2016-09-09 DIAGNOSIS — Z888 Allergy status to other drugs, medicaments and biological substances status: Secondary | ICD-10-CM

## 2016-09-09 DIAGNOSIS — Z823 Family history of stroke: Secondary | ICD-10-CM

## 2016-09-09 DIAGNOSIS — H3530 Unspecified macular degeneration: Secondary | ICD-10-CM | POA: Diagnosis present

## 2016-09-09 LAB — TROPONIN I: Troponin I: 0.04 ng/mL (ref <0.03)

## 2016-09-09 LAB — URINALYSIS, COMPLETE (UACMP) WITH MICROSCOPIC
BILIRUBIN URINE: NEGATIVE
Glucose, UA: NEGATIVE mg/dL
KETONES UR: NEGATIVE mg/dL
Nitrite: NEGATIVE
PROTEIN: 100 mg/dL — AB
Specific Gravity, Urine: 1.018 (ref 1.005–1.030)
pH: 5 (ref 5.0–8.0)

## 2016-09-09 LAB — LACTIC ACID, PLASMA
LACTIC ACID, VENOUS: 2.5 mmol/L — AB (ref 0.5–1.9)
Lactic Acid, Venous: 2.5 mmol/L (ref 0.5–1.9)

## 2016-09-09 LAB — CBC
HEMATOCRIT: 38 % — AB (ref 40.0–52.0)
HEMOGLOBIN: 11.2 g/dL — AB (ref 13.0–18.0)
MCH: 20.4 pg — AB (ref 26.0–34.0)
MCHC: 29.5 g/dL — AB (ref 32.0–36.0)
MCV: 68.9 fL — ABNORMAL LOW (ref 80.0–100.0)
Platelets: 355 10*3/uL (ref 150–440)
RBC: 5.52 MIL/uL (ref 4.40–5.90)
RDW: 21.4 % — ABNORMAL HIGH (ref 11.5–14.5)
WBC: 12.8 10*3/uL — ABNORMAL HIGH (ref 3.8–10.6)

## 2016-09-09 LAB — BASIC METABOLIC PANEL
Anion gap: 10 (ref 5–15)
BUN: 50 mg/dL — ABNORMAL HIGH (ref 6–20)
CHLORIDE: 107 mmol/L (ref 101–111)
CO2: 21 mmol/L — AB (ref 22–32)
CREATININE: 1.74 mg/dL — AB (ref 0.61–1.24)
Calcium: 9.1 mg/dL (ref 8.9–10.3)
GFR calc non Af Amer: 37 mL/min — ABNORMAL LOW (ref >60)
GFR, EST AFRICAN AMERICAN: 43 mL/min — AB (ref >60)
Glucose, Bld: 132 mg/dL — ABNORMAL HIGH (ref 65–99)
POTASSIUM: 4.7 mmol/L (ref 3.5–5.1)
Sodium: 138 mmol/L (ref 135–145)

## 2016-09-09 MED ORDER — PANTOPRAZOLE SODIUM 40 MG PO TBEC
40.0000 mg | DELAYED_RELEASE_TABLET | Freq: Every day | ORAL | Status: DC
  Administered 2016-09-10 – 2016-09-15 (×6): 40 mg via ORAL
  Filled 2016-09-09 (×6): qty 1

## 2016-09-09 MED ORDER — SODIUM CHLORIDE 0.9 % IV SOLN
INTRAVENOUS | Status: AC
  Administered 2016-09-09: 23:00:00 via INTRAVENOUS

## 2016-09-09 MED ORDER — GUAIFENESIN-DM 100-10 MG/5ML PO SYRP
5.0000 mL | ORAL_SOLUTION | ORAL | Status: DC | PRN
  Administered 2016-09-10 – 2016-09-13 (×4): 5 mL via ORAL
  Filled 2016-09-09 (×3): qty 5

## 2016-09-09 MED ORDER — IPRATROPIUM-ALBUTEROL 0.5-2.5 (3) MG/3ML IN SOLN
3.0000 mL | Freq: Once | RESPIRATORY_TRACT | Status: AC
  Administered 2016-09-09: 3 mL via RESPIRATORY_TRACT
  Filled 2016-09-09: qty 3

## 2016-09-09 MED ORDER — SPIRONOLACTONE 25 MG PO TABS
25.0000 mg | ORAL_TABLET | Freq: Every day | ORAL | Status: DC
  Administered 2016-09-10 – 2016-09-11 (×2): 25 mg via ORAL
  Filled 2016-09-09 (×2): qty 1

## 2016-09-09 MED ORDER — SODIUM CHLORIDE 0.9 % IV SOLN: 1.0000 g | Freq: Two times a day (BID) | INTRAVENOUS | Status: DC

## 2016-09-09 MED ORDER — LABETALOL HCL 5 MG/ML IV SOLN
INTRAVENOUS | Status: AC
  Administered 2016-09-09: 10 mg via INTRAVENOUS
  Filled 2016-09-09: qty 4

## 2016-09-09 MED ORDER — MORPHINE SULFATE (PF) 4 MG/ML IV SOLN
4.0000 mg | Freq: Once | INTRAVENOUS | Status: AC
  Administered 2016-09-09: 4 mg via INTRAVENOUS
  Filled 2016-09-09: qty 1

## 2016-09-09 MED ORDER — VANCOMYCIN HCL IN DEXTROSE 1-5 GM/200ML-% IV SOLN
1000.0000 mg | Freq: Once | INTRAVENOUS | Status: AC
  Administered 2016-09-09: 1000 mg via INTRAVENOUS
  Filled 2016-09-09: qty 200

## 2016-09-09 MED ORDER — ASPIRIN EC 81 MG PO TBEC
81.0000 mg | DELAYED_RELEASE_TABLET | Freq: Every day | ORAL | Status: DC
  Administered 2016-09-10 – 2016-09-15 (×6): 81 mg via ORAL
  Filled 2016-09-09 (×6): qty 1

## 2016-09-09 MED ORDER — LOSARTAN POTASSIUM 50 MG PO TABS: 50.0000 mg | ORAL_TABLET | Freq: Every day | ORAL | Status: DC

## 2016-09-09 MED ORDER — LABETALOL HCL 5 MG/ML IV SOLN
10.0000 mg | INTRAVENOUS | Status: DC | PRN
  Administered 2016-09-09: 10 mg via INTRAVENOUS

## 2016-09-09 MED ORDER — LORAZEPAM 2 MG/ML IJ SOLN
0.5000 mg | Freq: Once | INTRAMUSCULAR | Status: AC
  Administered 2016-09-09: 0.5 mg via INTRAVENOUS
  Filled 2016-09-09: qty 1

## 2016-09-09 MED ORDER — ATORVASTATIN CALCIUM 20 MG PO TABS
80.0000 mg | ORAL_TABLET | Freq: Every day | ORAL | Status: DC
  Administered 2016-09-10 – 2016-09-14 (×5): 80 mg via ORAL
  Filled 2016-09-09 (×5): qty 4

## 2016-09-09 MED ORDER — TETRAHYDROZOLINE HCL 0.05 % OP SOLN
2.0000 [drp] | Freq: Two times a day (BID) | OPHTHALMIC | Status: DC | PRN
  Filled 2016-09-09: qty 15

## 2016-09-09 MED ORDER — ONDANSETRON HCL 4 MG PO TABS: 4.0000 mg | ORAL_TABLET | Freq: Four times a day (QID) | ORAL | Status: DC | PRN

## 2016-09-09 MED ORDER — ONDANSETRON HCL 4 MG/2ML IJ SOLN: 4.0000 mg | Freq: Four times a day (QID) | INTRAMUSCULAR | Status: DC | PRN

## 2016-09-09 MED ORDER — ALBUTEROL SULFATE (2.5 MG/3ML) 0.083% IN NEBU
2.5000 mg | INHALATION_SOLUTION | RESPIRATORY_TRACT | Status: AC
  Administered 2016-09-09: 2.5 mg via RESPIRATORY_TRACT
  Filled 2016-09-09: qty 3

## 2016-09-09 MED ORDER — TIOTROPIUM BROMIDE MONOHYDRATE 18 MCG IN CAPS
18.0000 ug | ORAL_CAPSULE | Freq: Every day | RESPIRATORY_TRACT | Status: DC
  Administered 2016-09-10 – 2016-09-15 (×6): 18 ug via RESPIRATORY_TRACT
  Filled 2016-09-09: qty 5

## 2016-09-09 MED ORDER — BISOPROLOL FUMARATE 5 MG PO TABS
5.0000 mg | ORAL_TABLET | Freq: Every day | ORAL | Status: DC
  Administered 2016-09-10 – 2016-09-15 (×6): 5 mg via ORAL
  Filled 2016-09-09 (×6): qty 1

## 2016-09-09 MED ORDER — CEFEPIME-DEXTROSE 1 GM/50ML IV SOLR
1.0000 g | INTRAVENOUS | Status: AC
  Administered 2016-09-09: 1 g via INTRAVENOUS
  Filled 2016-09-09: qty 50

## 2016-09-09 MED ORDER — SODIUM CHLORIDE 0.9 % IV BOLUS (SEPSIS)
1000.0000 mL | Freq: Once | INTRAVENOUS | Status: AC
  Administered 2016-09-09: 1000 mL via INTRAVENOUS

## 2016-09-09 MED ORDER — HEPARIN SODIUM (PORCINE) 5000 UNIT/ML IJ SOLN: 5000.0000 [IU] | Freq: Three times a day (TID) | INTRAMUSCULAR | Status: DC

## 2016-09-09 MED ORDER — SODIUM CHLORIDE 0.9 % IV SOLN
1250.0000 mg | INTRAVENOUS | Status: DC
  Administered 2016-09-10 – 2016-09-12 (×3): 1250 mg via INTRAVENOUS
  Filled 2016-09-09 (×3): qty 1250

## 2016-09-09 MED ORDER — IPRATROPIUM-ALBUTEROL 0.5-2.5 (3) MG/3ML IN SOLN
3.0000 mL | RESPIRATORY_TRACT | Status: DC | PRN
  Administered 2016-09-10 – 2016-09-15 (×2): 3 mL via RESPIRATORY_TRACT
  Filled 2016-09-09: qty 3

## 2016-09-09 MED ORDER — SODIUM CHLORIDE 0.9% FLUSH
3.0000 mL | Freq: Two times a day (BID) | INTRAVENOUS | Status: DC
  Administered 2016-09-09 – 2016-09-15 (×11): 3 mL via INTRAVENOUS

## 2016-09-09 MED ORDER — FLUTICASONE FUROATE-VILANTEROL 100-25 MCG/INH IN AEPB
1.0000 | INHALATION_SPRAY | Freq: Every day | RESPIRATORY_TRACT | Status: DC
  Administered 2016-09-10 – 2016-09-15 (×6): 1 via RESPIRATORY_TRACT
  Filled 2016-09-09: qty 28

## 2016-09-09 MED ORDER — HYDROCODONE-ACETAMINOPHEN 10-325 MG PO TABS
1.0000 | ORAL_TABLET | Freq: Three times a day (TID) | ORAL | Status: DC | PRN
  Administered 2016-09-09 – 2016-09-10 (×2): 1 via ORAL
  Filled 2016-09-09 (×2): qty 1

## 2016-09-09 MED ORDER — ACETAMINOPHEN 325 MG PO TABS
650.0000 mg | ORAL_TABLET | Freq: Four times a day (QID) | ORAL | Status: DC | PRN
  Administered 2016-09-10: 650 mg via ORAL
  Filled 2016-09-09: qty 2

## 2016-09-09 MED ORDER — ACETAMINOPHEN 650 MG RE SUPP: 650.0000 mg | Freq: Four times a day (QID) | RECTAL | Status: DC | PRN

## 2016-09-09 MED ORDER — MEROPENEM-SODIUM CHLORIDE 1 GM/50ML IV SOLR
1.0000 g | Freq: Two times a day (BID) | INTRAVENOUS | Status: DC
  Administered 2016-09-10 – 2016-09-15 (×12): 1 g via INTRAVENOUS
  Filled 2016-09-09 (×15): qty 50

## 2016-09-09 MED ORDER — MAGNESIUM SULFATE IN D5W 1-5 GM/100ML-% IV SOLN
1.0000 g | Freq: Once | INTRAVENOUS | Status: AC
  Administered 2016-09-09: 1 g via INTRAVENOUS
  Filled 2016-09-09: qty 100

## 2016-09-09 NOTE — H&P (Signed)
[facility] NAME: Collin [name]    MR#:  [id number]  DATE OF BIRTH:  02/20/1941  DATE OF ADMISSION:  09/09/2016  PRIMARY CARE PHYSICIAN: [name], MD   REQUESTING/REFERRING PHYSICIAN: Jacqualine Code, MD  CHIEF COMPLAINT:   Chief Complaint  Patient presents with  . Respiratory Distress    HISTORY OF PRESENT ILLNESS:  Collin [name]  is a 75 y.o. male who presents with Progressive shortness of breath. 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 His had a Pleurx catheter in for the past year per his and his wife's report. However, over the last couple months it has not been draining very much. For the last several days his shortness of breath increased so he came to the ED for evaluation. Here he was found to have his chronic effusion, though now it appears to be loculated. He also had a little bit of an elevated white blood cell count. He was started on antibiotics and hospitalists were called for admission. Of note, he has severe heart failure with significantly reduced ejection fraction, less than 15% on his last echocardiogram which was back in May 2017.  PAST MEDICAL HISTORY:   Past Medical History:  Diagnosis Date  . Anemia   . Anxiety   . Arthritis   . Asthma   . Chronic systolic CHF (congestive heart failure) ([place])   . COPD (chronic obstructive pulmonary disease) ([place])   . GERD (gastroesophageal reflux disease)   . History of home oxygen therapy   . HLD (hyperlipidemia)   . HTN (hypertension)   . Hx of nephrostomy   . Hypertensive retinopathy   . Kidney stone   . Macular degeneration   . Peripheral vascular disease of lower extremity ([place])   . Pleural effusion   . Spinal stenosis of lumbar region     PAST SURGICAL HISTORY:   Past Surgical History:  Procedure Laterality Date  . ABDOMINAL AORTIC ANEURYSM REPAIR    . CHEST TUBE INSERTION Right 07/07/2015   Procedure: INSERTION  PLEURAL DRAINAGE CATHETER;  Surgeon: Nestor Lewandowsky, MD;  Location: [facility] ORS;  Service: Thoracic;  Laterality: Right;  . HERNIA REPAIR     Umbilical  . IR GENERIC HISTORICAL  06/21/2016   IR NEPHROSTOMY EXCHANGE RIGHT 06/21/2016 Sandi Mariscal, MD [facility]INTER[facility] RAD  . KIDNEY STONE SURGERY    . LEG AMPUTATION Left    above knee amputation  . NEPHROSTOMY TUBE PLACEMENT ([place] [facility])    . PERIPHERAL VASCULAR CATHETERIZATION N/A 11/03/2015   Procedure: Abdominal Aortogram w/Lower Extremity;  Surgeon: Algernon Huxley, MD;  Location: [place] [facility];  Service: Cardiovascular;  Laterality: N/A;  . PERIPHERAL VASCULAR CATHETERIZATION  11/03/2015   Procedure: Lower Extremity Intervention;  Surgeon: Algernon Huxley, MD;  Location: [place] [facility];  Service: Cardiovascular;;  . PERIPHERAL VASCULAR CATHETERIZATION Left 11/10/2015   Procedure: Lower Extremity Angiography;  Surgeon: Algernon Huxley, MD;  Location: [place] [facility];  Service: Cardiovascular;  Laterality: Left;  . PERIPHERAL VASCULAR CATHETERIZATION  11/10/2015   Procedure: Lower Extremity Intervention;  Surgeon: Algernon Huxley, MD;  Location: [place] [facility];  Service: Cardiovascular;;  . peripheral vascular stent Left     SOCIAL HISTORY:   Social History  Substance Use Topics  . Smoking status: Current Some Day Smoker    Packs/day: 0.25    Years: 45.00    Types: Cigarettes  . Smokeless tobacco: Never Used  .  Alcohol use No    FAMILY HISTORY:   Family History  Problem Relation Age of Onset  . Heart attack Mother   . Stroke Mother   . Cancer Father   . COPD Father   . Heart attack Father     DRUG ALLERGIES:   Allergies  Allergen Reactions  . Lisinopril Anaphylaxis  . Metoprolol Other (See Comments)    Reaction:  Ringing in ears   . Gabapentin Rash    MEDICATIONS AT HOME:   Prior to Admission medications   Medication Sig Start Date End Date Taking? Authorizing Provider  acetaminophen (TYLENOL) 500 MG tablet Take 1,000 mg by  mouth 3 (three) times daily.     Historical Provider, MD  albuterol (PROVENTIL HFA;VENTOLIN HFA) 108 (90 BASE) MCG/ACT inhaler Inhale 2 puffs into the lungs every 4 (four) hours as needed for wheezing or shortness of breath.     Historical Provider, MD  ALPRAZolam Duanne Moron) 0.25 MG tablet Take 1 tablet (0.25 mg total) by mouth 2 (two) times daily as needed for anxiety or sleep. Patient not taking: Reported on 06/29/2016 12/10/15   Hillary Bow, MD  aspirin EC 81 MG tablet Take 81 mg by mouth daily.    Historical Provider, MD  atorvastatin (LIPITOR) 80 MG tablet Take 80 mg by mouth daily at 6 PM.    Historical Provider, MD  bisoprolol (ZEBETA) 5 MG tablet Take 5 mg by mouth daily.    Historical Provider, MD  cyanocobalamin 1000 MCG tablet Take 1,000 mcg by mouth daily.    Historical Provider, MD  eplerenone (INSPRA) 25 MG tablet TAKE 1 TABLET ONCE DAILY 08/02/16   Historical Provider, MD  fluticasone furoate-vilanterol (BREO ELLIPTA) 100-25 MCG/INH AEPB Inhale 1 puff into the lungs daily.    Historical Provider, MD  furosemide (LASIX) 80 MG tablet Take 1 tablet (80 mg total) by mouth 2 (two) times daily. Patient taking differently: Take 80 mg by mouth daily.  12/10/15   Hillary Bow, MD  HYDROcodone-acetaminophen (NORCO) 10-325 MG tablet Take 1 tablet by mouth every 8 (eight) hours as needed for moderate pain. 12/10/15   Srikar Sudini, MD  hydrocortisone 2.5 % cream Apply 1 application topically 3 (three) times daily as needed ([place]).     Historical Provider, MD  losartan (COZAAR) 50 MG tablet TAKE 1 TABLET BY MOUTH DAILY 08/02/16   Historical Provider, MD  Multiple Vitamin (MULTIVITAMIN WITH MINERALS) TABS tablet Take 1 tablet by mouth daily.    Historical Provider, MD  nitroGLYCERIN (NITROSTAT) 0.4 MG SL tablet Take 0.4 mg by mouth every 5 (five) minutes x 3 doses as needed for chest pain.     Historical Provider, MD  omeprazole (PRILOSEC) 20 MG capsule Take 20 mg by mouth daily.     Historical  Provider, MD  Tetrahydrozoline HCl (VISINE OP) Place 2 drops into both eyes 2 (two) times daily as needed ([place]).    Historical Provider, MD  tiotropium (SPIRIVA HANDIHALER) 18 MCG inhalation capsule Place 1 capsule (18 mcg total) into inhaler and inhale daily. 03/06/15   Hillary Bow, MD    REVIEW OF SYSTEMS:  Review of Systems  Constitutional: Positive for malaise/fatigue. Negative for chills, fever and weight loss.  HENT: Negative for ear pain, hearing loss and tinnitus.   Eyes: Negative for blurred vision, double vision, pain and redness.  Respiratory: Positive for shortness of breath and wheezing. Negative for cough and hemoptysis.   Cardiovascular: Negative for chest pain, palpitations, orthopnea and leg swelling.  Gastrointestinal: Negative for abdominal pain, constipation, diarrhea, nausea and vomiting.  Genitourinary: Negative for dysuria, frequency and hematuria.  Musculoskeletal: Negative for back pain, joint pain and neck pain.  Skin:       No acne, rash, or lesions  Neurological: Negative for dizziness, tremors, focal weakness and weakness.  Endo/Heme/Allergies: Negative for polydipsia. Does not bruise/bleed easily.  Psychiatric/Behavioral: Negative for depression. The patient is not nervous/anxious and does not have insomnia.      VITAL SIGNS:   Vitals:   09/09/16 1803 09/09/16 1900 09/09/16 1930 09/09/16 2000  BP:  (!) 141/95  (!) 146/90  Pulse:  (!) 112 (!) 108 (!) 107  Resp:  (!) 25 (!) 24 (!) 24  Temp:      TempSrc:      SpO2: 100% 92% 98% 95%  Weight:      Height:       Wt Readings from Last 3 Encounters:  09/09/16 82.3 kg (181 lb 7 oz)  06/29/16 76.2 kg (168 lb)  06/21/16 76.2 kg (168 lb)    PHYSICAL EXAMINATION:  Physical Exam  Vitals reviewed. Constitutional: He is oriented to person, place, and time. He appears well-developed and well-nourished. No distress.  HENT:  Head: Normocephalic and atraumatic.  Mouth/Throat: Oropharynx is clear and  moist.  Eyes: Conjunctivae and EOM are normal. Pupils are equal, round, and reactive to light. No scleral icterus.  Neck: Normal range of motion. Neck supple. No JVD present. No thyromegaly present.  Cardiovascular: Normal rate, regular rhythm and intact distal pulses.  Exam reveals no gallop and no friction rub.   No murmur heard. Respiratory: He is in respiratory distress (mild). He has wheezes. He has rales.  GI: Soft. Bowel sounds are normal. He exhibits no distension. There is no tenderness.  Musculoskeletal: Normal range of motion. He exhibits no edema.  No arthritis, no gout  Lymphadenopathy:    He has no cervical adenopathy.  Neurological: He is alert and oriented to person, place, and time. No cranial nerve deficit.  No dysarthria, no aphasia  Skin: Skin is warm and dry. No rash noted. No erythema.  Psychiatric: He has a normal mood and affect. His behavior is normal. Judgment and thought content normal.    LABORATORY PANEL:   CBC  Recent Labs Lab 09/09/16 1817  WBC 12.8*  HGB 11.2*  HCT 38.0*  PLT 355   ------------------------------------------------------------------------------------------------------------------  Chemistries   Recent Labs Lab 09/09/16 1817  NA 138  K 4.7  CL 107  CO2 21*  GLUCOSE 132*  BUN 50*  CREATININE 1.74*  CALCIUM 9.1   ------------------------------------------------------------------------------------------------------------------  Cardiac Enzymes  Recent Labs Lab 09/09/16 1817  TROPONINI 0.04*   ------------------------------------------------------------------------------------------------------------------  RADIOLOGY:  Dg Chest Portable 1 View  Result Date: 09/09/2016 CLINICAL DATA:  Progressive shortness of breath and productive cough. EXAM: PORTABLE CHEST 1 VIEW COMPARISON:  Chest x-rays dated 12/07/2015, 11/29/2015 and 10/26/2015 FINDINGS: There has been a marked increase in the loculated right lateral pleural  effusion since the prior study of 12/07/2015. Chronic cardiomegaly. Pulmonary vascularity is normal. No appreciable left effusion. IMPRESSION: Large loculated right lateral pleural effusion. Electronically Signed   By: Lorriane Shire M.D.   On: 09/09/2016 19:03    EKG:   Orders placed or performed during the hospital encounter of 09/09/16  . EKG 12-Lead  . EKG 12-Lead    IMPRESSION AND PLAN:  Principal Problem:   Sepsis ([place]) - suspected source is infection in his effusion. 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 IV antibiotics started in the ED and continued on admission. Patient is hemodynamically stable, lactic acid was mildly elevated. We are giving him fluids very cautiously given his heart failure, and we'll stop them as soon as his lactic acid has normalized. Active Problems:   Recurrent right pleural effusion - pulmonary consult for potential procedure in order to break up the loculations and drain his effusion, antibiotics as above   COPD (chronic obstructive pulmonary disease) ([place]) - continue home inhalers and medications, when necessary duo nebs   Chronic systolic CHF (congestive heart failure) ([place]) - continue home meds, repeat echocardiogram in the morning   HTN (hypertension) - stable, continue home meds   GERD (gastroesophageal reflux disease) - home dose PPI  All the records are reviewed and case discussed with ED provider. Management plans discussed with the patient and/or family.  DVT PROPHYLAXIS: SubQ heparin  GI PROPHYLAXIS: PPI  ADMISSION STATUS: Inpatient  CODE STATUS: Full Code Status History    Date Active Date Inactive Code Status Order ID Comments User Context   12/07/2015  4:43 PM 12/10/2015  7:02 PM Full Code [id number]  Dustin Flock, MD ED   11/29/2015  8:27 PM 12/01/2015  4:15 PM Full Code [id number]  Sylvan Cheese, MD ED   10/26/2015 10:08 PM 10/29/2015  5:25 PM Full Code [id number]   Samson Frederic, DO Inpatient   08/22/2015  5:44 PM 08/31/2015  4:39 PM Full Code [id number]  Theodoro Grist, MD Inpatient   07/07/2015  1:57 PM 07/08/2015  2:28 PM Full Code [id number]  Nestor Lewandowsky, MD Inpatient   06/12/2015  3:54 PM 06/14/2015  5:01 PM Full Code [id number]  Baxter Hire, MD Inpatient   03/20/2015 12:20 AM 03/21/2015  9:25 PM Full Code [id number]  Lance Coon, MD ED   03/04/2015  5:52 AM 03/06/2015  2:26 PM Full Code [id number]  Lance Coon, MD Inpatient      TOTAL TIME TAKING CARE OF THIS PATIENT: 45 minutes.    Rosezella Kronick FIELDING 09/09/2016, 8:31 PM  [facility]  Office  [phone]  CC: Primary care physician; [name], MD

## 2016-09-09 NOTE — Progress Notes (Signed)
Pharmacy Antibiotic Note  Collin [name] is a 75 y.o. male admitted on 09/09/2016 with sepsis.  Pharmacy has been consulted for vancomycin and meropenem dosing.  Plan: DW 82kg  Vd 58L kei 0.036 hr-1  T1/2 19 hours Vancomycin 1250 mg q 24 hours ordered with stacked dosing. Level before 5th dose. Goal trough 15-20.  Meropenem 1 gram q 12 hours ordered.  Height: 5\' 9"  (175.3 cm) Weight: 181 lb 7 oz (82.3 kg) IBW/kg (Calculated) : 70.7  Temp (24hrs), Avg:97.3 F (36.3 C), Min:97.3 F (36.3 C), Max:97.3 F (36.3 C)   Recent Labs Lab 09/09/16 1817 09/09/16 2143  WBC 12.8*  --   CREATININE 1.74*  --   LATICACIDVEN 2.5* 2.5*    Estimated Creatinine Clearance: 37.8 mL/min (by C-G formula based on SCr of 1.74 mg/dL (H)).    Allergies  Allergen Reactions  . Lisinopril Anaphylaxis  . Metoprolol Other (See Comments)    Reaction:  Ringing in ears   . Gabapentin Rash    Antimicrobials this admission: vancomycin 12/30 >>  meropenem 12/31 >>  Cefepime x1 in ED  Dose adjustments this admission:   Microbiology results: 12/30 BCx: pending 12/30 UCx: pending      12/30 UA: LE(+) NO2(-) WBC TNTC 12/30 CXR R pleural effusion  Thank you for allowing pharmacy to be a part of this patient's care.  Keta Vanvalkenburgh S 09/09/2016 11:16 PM

## 2016-09-09 NOTE — ED Triage Notes (Signed)
Pt to ED via EMS from home c/o difficulty breathing for 2 months worsening past 3 days.  Pt with positive productive cough.  Hx of COPD and pneumonia last being 2 months ago.  Pt with wheezing throughout lungs, labored breathing and retractions, sinus tach on monitor.  EMS administered 2 duonebs, 125mg  solumedrol and placed on 5L [place].  Pt is A&Ox4, skin warm and dry.

## 2016-09-09 NOTE — ED Provider Notes (Signed)
[facility] Emergency Department Provider Note   ____________________________________________   First MD Initiated Contact with Patient 09/09/16 1801     (approximate)  I have reviewed the triage vital signs and the nursing notes.   HISTORY  Chief Complaint Respiratory Distress  EM caveat: Some limitation due to dyspnea and acuity  HPI Collin [name] is a 75 y.o. male here for evaluation of shortness of breath. Reports 4 days of increasing shortness of breath, associated with a productive cough and feels like he is developed "pneumonia" with sweats and chills at night. He's been having wheezing, using nebulizers without improvement, at present time reports he feels slightly improved but is starting to get "jittery" after steroids and tells me that he has to receive Ativan esterase make him jittery.  This is 4-5 L nasal cannula at baseline. He continues to feel mildly short of breath, but reports his symptoms are somewhat better after EMS treated him.  No nausea or vomiting. No chest pain. No fevers, no diarrhea.  He has a right-sided chest tube/catheter that he has not had to utilize for some time to drain fluid off the right lung. In addition, he has a right-sided nephrostomy tube.   Past Medical History:  Diagnosis Date  . Anemia   . Anxiety   . Arthritis   . Asthma   . Chronic systolic CHF (congestive heart failure) ([place])   . COPD (chronic obstructive pulmonary disease) ([place])   . GERD (gastroesophageal reflux disease)   . History of home oxygen therapy   . HLD (hyperlipidemia)   . HTN (hypertension)   . Hx of nephrostomy   . Hypertensive retinopathy   . Kidney stone   . Macular degeneration   . Peripheral vascular disease of lower extremity ([place])   . Pleural effusion   . Spinal stenosis of lumbar region     Patient Active Problem List   Diagnosis Date Noted  . Lumbar canal stenosis 12/03/2015  . COPD exacerbation ([place]) 11/29/2015  .  Tobacco use 11/29/2015  . Sepsis ([place]) 10/26/2015  . Chronic respiratory failure with hypoxia ([place]) 10/26/2015  . Gangrenous toe ([place]) 10/26/2015  . UTI (lower urinary tract infection)   . Hydronephrosis with urinary obstruction due to renal calculus   . Acute on chronic respiratory failure ([place]) 06/12/2015  . Acute on chronic systolic CHF (congestive heart failure) ([place]) 03/19/2015  . Open wound of left heel 03/19/2015  . Open wnd of foot 03/19/2015  . Acute on chronic systolic heart failure ([place]) 03/19/2015  . Recurrent right pleural effusion 03/04/2015  . COPD (chronic obstructive pulmonary disease) ([place]) 03/04/2015  . HLD (hyperlipidemia) 03/04/2015  . Chronic systolic CHF (congestive heart failure) ([place]) 03/04/2015  . HTN (hypertension) 03/04/2015  . GERD (gastroesophageal reflux disease) 03/04/2015  . Pleural cavity effusion 03/04/2015  . Compulsive tobacco user syndrome 01/07/2015  . Lumbar radiculopathy 06/12/2014  . Neuritis or radiculitis due to rupture of lumbar intervertebral disc 06/12/2014  . Calculus of kidney 12/09/2013  . Lower urinary tract symptoms 12/09/2013  . Benign prostatic hyperplasia with urinary obstruction 12/09/2013  . Back pain, chronic 04/04/2013  . Age-related macular degeneration 03/11/2012  . 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 Cataract, nuclear sclerotic senile 11/10/2011  . Nonexudative age-related macular degeneration 11/10/2011  . Hypertensive retinopathy 11/10/2011  . 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 Cataract cortical, senile 11/10/2011  . Loss of teeth due to gum disease 05/22/2011  . Complete loss of  teeth due to periodontal disease 05/22/2011    Past Surgical History:  Procedure Laterality Date  . ABDOMINAL AORTIC ANEURYSM REPAIR    . CHEST TUBE INSERTION Right 07/07/2015   Procedure: INSERTION PLEURAL DRAINAGE CATHETER;  Surgeon: Nestor Lewandowsky, MD;  Location: [facility] ORS;  Service: Thoracic;  Laterality:  Right;  . HERNIA REPAIR     Umbilical  . IR GENERIC HISTORICAL  06/21/2016   IR NEPHROSTOMY EXCHANGE RIGHT 06/21/2016 Sandi Mariscal, MD [facility]-INTERV RAD  . KIDNEY STONE SURGERY    . LEG AMPUTATION Left    above knee amputation  . NEPHROSTOMY TUBE PLACEMENT ([place] HX)    . PERIPHERAL VASCULAR CATHETERIZATION N/A 11/03/2015   Procedure: Abdominal Aortogram w/Lower Extremity;  Surgeon: Algernon Huxley, MD;  Location: [place] CV LAB;  Service: Cardiovascular;  Laterality: N/A;  . PERIPHERAL VASCULAR CATHETERIZATION  11/03/2015   Procedure: Lower Extremity Intervention;  Surgeon: Algernon Huxley, MD;  Location: [place] CV LAB;  Service: Cardiovascular;;  . PERIPHERAL VASCULAR CATHETERIZATION Left 11/10/2015   Procedure: Lower Extremity Angiography;  Surgeon: Algernon Huxley, MD;  Location: [place] CV LAB;  Service: Cardiovascular;  Laterality: Left;  . PERIPHERAL VASCULAR CATHETERIZATION  11/10/2015   Procedure: Lower Extremity Intervention;  Surgeon: Algernon Huxley, MD;  Location: [place] [facility] LAB;  Service: Cardiovascular;;  . peripheral vascular stent Left     Prior to Admission medications   Medication Sig Start Date End Date Taking? Authorizing Provider  acetaminophen (TYLENOL) 500 MG tablet Take 1,000 mg by mouth 3 (three) times daily.     Historical Provider, MD  albuterol (PROVENTIL HFA;VENTOLIN HFA) 108 (90 BASE) MCG/ACT inhaler Inhale 2 puffs into the lungs every 4 (four) hours as needed for wheezing or shortness of breath.     Historical Provider, MD  ALPRAZolam Duanne Moron) 0.25 MG tablet Take 1 tablet (0.25 mg total) by mouth 2 (two) times daily as needed for anxiety or sleep. Patient not taking: Reported on 06/29/2016 12/10/15   Hillary Bow, MD  aspirin EC 81 MG tablet Take 81 mg by mouth daily.    Historical Provider, MD  atorvastatin (LIPITOR) 80 MG tablet Take 80 mg by mouth daily at 6 PM.    Historical Provider, MD  bisoprolol (ZEBETA) 5 MG tablet Take 5 mg by mouth daily.     Historical Provider, MD  cyanocobalamin 1000 MCG tablet Take 1,000 mcg by mouth daily.    Historical Provider, MD  eplerenone (INSPRA) 25 MG tablet TAKE 1 TABLET ONCE DAILY 08/02/16   Historical Provider, MD  fluticasone furoate-vilanterol (BREO ELLIPTA) 100-25 MCG/INH AEPB Inhale 1 puff into the lungs daily.    Historical Provider, MD  furosemide (LASIX) 80 MG tablet Take 1 tablet (80 mg total) by mouth 2 (two) times daily. Patient taking differently: Take 80 mg by mouth daily.  12/10/15   Hillary Bow, MD  HYDROcodone-acetaminophen (NORCO) 10-325 MG tablet Take 1 tablet by mouth every 8 (eight) hours as needed for moderate pain. 12/10/15   Srikar Sudini, MD  hydrocortisone 2.5 % cream Apply 1 application topically 3 (three) times daily as needed ([place]).     Historical Provider, MD  losartan (COZAAR) 50 MG tablet TAKE 1 TABLET BY MOUTH DAILY 08/02/16   Historical Provider, MD  Multiple Vitamin (MULTIVITAMIN WITH MINERALS) TABS tablet Take 1 tablet by mouth daily.    Historical Provider, MD  nitroGLYCERIN (NITROSTAT) 0.4 MG SL tablet Take 0.4 mg by mouth every 5 (five) minutes x 3 doses as  needed for chest pain.     Historical Provider, MD  omeprazole (PRILOSEC) 20 MG capsule Take 20 mg by mouth daily.     Historical Provider, MD  Tetrahydrozoline HCl (VISINE OP) Place 2 drops into both eyes 2 (two) times daily as needed ([place]).    Historical Provider, MD  tiotropium (SPIRIVA HANDIHALER) 18 MCG inhalation capsule Place 1 capsule (18 mcg total) into inhaler and inhale daily. 03/06/15   Hillary Bow, MD    Allergies Lisinopril; Metoprolol; and Gabapentin  Family History  Problem Relation Age of Onset  . Heart attack Mother   . Stroke Mother   . Cancer Father   . COPD Father   . Heart attack Father     Social History Social History  Substance Use Topics  . Smoking status: Current Some Day Smoker    Packs/day: 0.25    Years: 45.00    Types: Cigarettes  . Smokeless tobacco: Never  Used  . Alcohol use No    Review of Systems Constitutional: Generally sleeping weak, chills in the evening and more fatigued than normal Eyes: No visual changes. ENT: No sore throat. Cardiovascular: Denies chest pain. Respiratory: See history of present illness Gastrointestinal: No abdominal pain.  No nausea, no vomiting.  No diarrhea.  No constipation. Does note mild pain over his right side nephrostomy, this is chronic Genitourinary: Negative for dysuria. Musculoskeletal: Negative for back pain. Skin: Negative for rash. Neurological: Negative for headaches, focal weakness or numbness.  10-point ROS otherwise negative.  ____________________________________________   PHYSICAL EXAM:  VITAL SIGNS: ED Triage Vitals  Enc Vitals Group     BP 09/09/16 1759 (!) 147/98     Pulse Rate 09/09/16 1759 (!) 107     Resp 09/09/16 1759 (!) 27     Temp 09/09/16 1759 97.3 F (36.3 C)     Temp Source 09/09/16 1759 Oral     SpO2 09/09/16 1759 100 %     Weight 09/09/16 1802 181 lb 7 oz (82.3 kg)     Height 09/09/16 1802 5\' 9"  (1.753 m)     Head Circumference --      Peak Flow --      Pain Score 09/09/16 1803 6     Pain Loc --      Pain Edu? --      Excl. in [place]? --     Constitutional: Alert and oriented.Mildly dyspneic, slightly tachypnea with mild use of accessory muscles. He is able to speak in short phrases Eyes: Conjunctivae are normal. PERRL. EOMI. Head: Atraumatic. Nose: No congestion/rhinnorhea. Mouth/Throat: Mucous membranes are slightly dry.  Oropharynx non-erythematous. Neck: No stridor.   Cardiovascular: Slightly tachycardic rate, regular rhythm. Grossly normal heart sounds.  Good peripheral circulation. Respiratory: Mild accessory muscle use, speaks in short phrases, mild end expiratory wheezing heard throughout all lobes without focal rales noted, perhaps some slight diminishment over the right lower lung fields where a Plourde X catheter is in place Gastrointestinal: Soft  and nontender. No distention. No abdominal bruits. No CVA tenderness. Right-sided nephrostomy tube noted Musculoskeletal: No lower extremity tenderness nor edema.  No joint effusions. Left below the knee amputation. Neurologic:  Normal speech and language. No gross focal neurologic deficits are appreciated. Skin:  Skin is warm, dry and intact. No rash noted. Psychiatric: Mood and affect are normal. Speech and behavior are normal.  ____________________________________________   LABS (all labs ordered are listed, but only abnormal results are displayed)  Labs Reviewed  LACTIC ACID, PLASMA -  Abnormal; Notable for the following:       Result Value   Lactic Acid, Venous 2.5 (*)    All other components within normal limits  LACTIC ACID, PLASMA - Abnormal; Notable for the following:    Lactic Acid, Venous 2.5 (*)    All other components within normal limits  CBC - Abnormal; Notable for the following:    WBC 12.8 (*)    Hemoglobin 11.2 (*)    HCT 38.0 (*)    MCV 68.9 (*)    MCH 20.4 (*)    MCHC 29.5 (*)    RDW 21.4 (*)    All other components within normal limits  BASIC METABOLIC PANEL - Abnormal; Notable for the following:    CO2 21 (*)    Glucose, Bld 132 (*)    BUN 50 (*)    Creatinine, Ser 1.74 (*)    GFR calc non Af Amer 37 (*)    GFR calc Af Amer 43 (*)    All other components within normal limits  TROPONIN I - Abnormal; Notable for the following:    Troponin I 0.04 (*)    All other components within normal limits  BLOOD GAS, VENOUS - Abnormal; Notable for the following:    pCO2, Ven 38 (*)    Acid-base deficit 5.4 (*)    All other components within normal limits  URINALYSIS, COMPLETE (UACMP) WITH MICROSCOPIC - Abnormal; Notable for the following:    Color, Urine YELLOW (*)    APPearance CLOUDY (*)    Hgb urine dipstick MODERATE (*)    Protein, ur 100 (*)    Leukocytes, UA LARGE (*)    Bacteria, UA FEW (*)    Squamous Epithelial / LPF 0-5 (*)    All other components  within normal limits  CULTURE, BLOOD (ROUTINE X 2)  CULTURE, BLOOD (ROUTINE X 2)  URINE CULTURE  BASIC METABOLIC PANEL  CBC  TROPONIN I  TROPONIN I  TROPONIN I   ____________________________________________  EKG  Reviewed and interpreted by me at 1759 Heart rate 110 QRS 85 QTC 500 Sinus tachycardia, normal axis, possible old anteroseptal infarct, nonspecific T-wave abnormality noted lateral precordial leads, no evidence of acute ST elevation ____________________________________________  RADIOLOGY  Dg Chest Portable 1 View  Result Date: 09/09/2016 CLINICAL DATA:  Progressive shortness of breath and productive cough. EXAM: PORTABLE CHEST 1 VIEW COMPARISON:  Chest x-rays dated 12/07/2015, 11/29/2015 and 10/26/2015 FINDINGS: There has been a marked increase in the loculated right lateral pleural effusion since the prior study of 12/07/2015. Chronic cardiomegaly. Pulmonary vascularity is normal. No appreciable left effusion. IMPRESSION: Large loculated right lateral pleural effusion. Electronically Signed   By: Lorriane Shire M.D.   On: 09/09/2016 19:03    ____________________________________________   PROCEDURES  Procedure(s) performed: None  Procedures  Critical Care performed: No  ____________________________________________   INITIAL IMPRESSION / ASSESSMENT AND PLAN / ED COURSE  Pertinent labs & imaging results that were available during my care of the patient were reviewed by me and considered in my medical decision making (see chart for details).  Patient presents for dyspnea, mild to moderate increased work of breathing noted with wheezing and diminished lung sounds over the right. Of note, chest x-ray demonstrates a recurrent pleural effusion which now appears large loculated. I discussed with Dr. Wallis Bamberg of pulmonary critical care who reviewed the case and the and recommends the patient be admitted, and the patient will have a pulmonary consultation performed  tomorrow at [facility]  regional.  The patient showed significant improvement in his work of breathing afternebulizers, steroids, and magnesium, but continues to have diminished lung sounds over the right. This presentation appears most con COPD exacerbation, but given his leukocytosis and a loculated effusion on chest x-ray I covered him empirically for pneumonia as well.  No cardiac symptoms. No evidence of acute coronary syndrome. No evidence of pneumothorax, dissection, or other acute cardiac or pulmonary process identified the unlikely COPD loculated effusion. Patient reporting improvement, comfortable the plan to be admitted value by pulmonology here tomorrow.  Clinical Course      ____________________________________________   FINAL CLINICAL IMPRESSION(S) / ED DIAGNOSES  Final diagnoses:  COPD exacerbation ([place])  Loculated pleural effusion      NEW MEDICATIONS STARTED DURING THIS VISIT:  Current Discharge Medication List       Note:  This document was prepared using Dragon voice recognition software and may include unintentional dictation errors.     Delman Kitten, MD 09/09/16 [phone]

## 2016-09-09 NOTE — ED Notes (Signed)
Pt given graham crackers.

## 2016-09-10 ENCOUNTER — Inpatient Hospital Stay
Admit: 2016-09-10 | Discharge: 2016-09-10 | Disposition: A | Discharge status: Home / Self Care | Payer: Medicare Other | Attending: Internal Medicine | Admitting: Internal Medicine

## 2016-09-10 ENCOUNTER — Inpatient Hospital Stay: Payer: Medicare Other

## 2016-09-10 LAB — TROPONIN I
TROPONIN I: 0.03 ng/mL — AB (ref <0.03)
TROPONIN I: 0.69 ng/mL — AB (ref <0.03)
Troponin I: 0.03 ng/mL (ref <0.03)

## 2016-09-10 LAB — CBC
HEMATOCRIT: 34.3 % — AB (ref 40.0–52.0)
HEMOGLOBIN: 10.2 g/dL — AB (ref 13.0–18.0)
MCH: 20.3 pg — ABNORMAL LOW (ref 26.0–34.0)
MCHC: 29.7 g/dL — ABNORMAL LOW (ref 32.0–36.0)
MCV: 68.2 fL — AB (ref 80.0–100.0)
PLATELETS: 275 10*3/uL (ref 150–440)
RBC: 5.03 MIL/uL (ref 4.40–5.90)
RDW: 21.6 % — ABNORMAL HIGH (ref 11.5–14.5)
WBC: 8.3 10*3/uL (ref 3.8–10.6)

## 2016-09-10 LAB — PROTIME-INR
INR: 1.43
Prothrombin Time: 17.6 seconds — ABNORMAL HIGH (ref 11.4–15.2)

## 2016-09-10 LAB — BASIC METABOLIC PANEL
ANION GAP: 7 (ref 5–15)
BUN: 49 mg/dL — ABNORMAL HIGH (ref 6–20)
CHLORIDE: 110 mmol/L (ref 101–111)
CO2: 19 mmol/L — ABNORMAL LOW (ref 22–32)
CREATININE: 1.63 mg/dL — AB (ref 0.61–1.24)
Calcium: 8.8 mg/dL — ABNORMAL LOW (ref 8.9–10.3)
GFR calc non Af Amer: 40 mL/min — ABNORMAL LOW (ref >60)
GFR, EST AFRICAN AMERICAN: 47 mL/min — AB (ref >60)
Glucose, Bld: 236 mg/dL — ABNORMAL HIGH (ref 65–99)
POTASSIUM: 4.8 mmol/L (ref 3.5–5.1)
SODIUM: 136 mmol/L (ref 135–145)

## 2016-09-10 LAB — BRAIN NATRIURETIC PEPTIDE: B Natriuretic Peptide: >4500 pg/mL — ABNORMAL HIGH (ref 0.0–100.0)

## 2016-09-10 LAB — LACTIC ACID, PLASMA
Lactic Acid, Venous: 2.3 mmol/L (ref 0.5–1.9)
Lactic Acid, Venous: 1.5 mmol/L (ref 0.5–1.9)

## 2016-09-10 LAB — APTT: aPTT: 34 seconds (ref 24–36)

## 2016-09-10 MED ORDER — SACUBITRIL-VALSARTAN 24-26 MG PO TABS
1.0000 | ORAL_TABLET | Freq: Two times a day (BID) | ORAL | Status: DC
  Administered 2016-09-10 – 2016-09-11 (×4): 1 via ORAL
  Filled 2016-09-10 (×4): qty 1

## 2016-09-10 MED ORDER — SODIUM CHLORIDE 0.9 % IV SOLN
INTRAVENOUS | Status: DC
  Administered 2016-09-10: 05:00:00 via INTRAVENOUS

## 2016-09-10 MED ORDER — HEPARIN BOLUS VIA INFUSION
4000.0000 [IU] | Freq: Once | INTRAVENOUS | Status: AC
  Administered 2016-09-10: 4000 [IU] via INTRAVENOUS
  Filled 2016-09-10: qty 4000

## 2016-09-10 MED ORDER — HYDROCODONE-ACETAMINOPHEN 10-325 MG PO TABS
1.0000 | ORAL_TABLET | Freq: Four times a day (QID) | ORAL | Status: DC | PRN
  Administered 2016-09-10 – 2016-09-15 (×20): 1 via ORAL
  Filled 2016-09-10 (×20): qty 1

## 2016-09-10 MED ORDER — LABETALOL HCL 5 MG/ML IV SOLN
10.0000 mg | Freq: Once | INTRAVENOUS | Status: AC
  Administered 2016-09-10: 10 mg via INTRAVENOUS
  Filled 2016-09-10: qty 4

## 2016-09-10 MED ORDER — FUROSEMIDE 10 MG/ML IJ SOLN
60.0000 mg | Freq: Once | INTRAMUSCULAR | Status: AC
  Administered 2016-09-10: 60 mg via INTRAVENOUS
  Filled 2016-09-10: qty 6

## 2016-09-10 MED ORDER — HEPARIN (PORCINE) IN NACL 100-0.45 UNIT/ML-% IJ SOLN
1100.0000 [IU]/h | INTRAMUSCULAR | Status: DC
  Administered 2016-09-10: 1100 [IU]/h via INTRAVENOUS

## 2016-09-10 NOTE — Progress Notes (Signed)
Patient was admitted at 2330 12/30. Spouse is at the bed side. Patient is dyspneic at rest. Patient is upset about NPO order after midnight. Patient demanded to have some ice.   Notified Dr. Marcille Blanco of abnormal labs at 0330. Orders were placed for lasix due BNP level and iv fluids continuation for elevated lactic acid.

## 2016-09-10 NOTE — Progress Notes (Signed)
Dr. Margaretmary Eddy and Dr. Noah Delaine notified that CT Abd/Pelvis results are back.

## 2016-09-10 NOTE — Progress Notes (Signed)
ANTICOAGULATION CONSULT NOTE - Initial Consult  Pharmacy Consult for heparin dri[ Indication: chest pain/ACS/STEMI  Allergies  Allergen Reactions  . Lisinopril Anaphylaxis  . Metoprolol Other (See Comments)    Reaction:  Ringing in ears   . Gabapentin Rash    Patient Measurements: Height: 5\' 9"  (175.3 cm) Weight: 186 lb 3.2 oz (84.5 kg) IBW/kg (Calculated) : 70.7 Heparin Dosing Weight: 84.5 kg  Vital Signs: Temp: 97.3 F (36.3 C) (12/30 1759) Temp Source: Oral (12/30 1759) BP: 138/86 (12/31 0129) Pulse Rate: 86 (12/31 0129)  Labs:  Recent Labs  09/09/16 1817 09/10/16 0014 09/10/16 0139  HGB 11.2*  --   --   HCT 38.0*  --   --   PLT 355  --   --   APTT  --   --  34  LABPROT  --   --  17.6*  INR  --   --  1.43  CREATININE 1.74*  --   --   TROPONINI 0.04* 0.69*  --     Estimated Creatinine Clearance: 37.8 mL/min (by C-G formula based on SCr of 1.74 mg/dL (H)).   Medical History: Past Medical History:  Diagnosis Date  . Anemia   . Anxiety   . Arthritis   . Asthma   . Chronic systolic CHF (congestive heart failure) ([place])   . COPD (chronic obstructive pulmonary disease) ([place])   . GERD (gastroesophageal reflux disease)   . History of home oxygen therapy   . HLD (hyperlipidemia)   . HTN (hypertension)   . Hx of nephrostomy   . Hypertensive retinopathy   . Kidney stone   . Macular degeneration   . Peripheral vascular disease of lower extremity ([place])   . Pleural effusion   . Spinal stenosis of lumbar region     Medications:  No anticoagulation in PTA meds.  Assessment:  Goal of Therapy:  Heparin level 0.3-0.7 units/ml Monitor platelets by anticoagulation protocol: Yes   Plan:  4000 unit bolus and initial rate of 1100 units/hr. First heparin level 8 hours after start of infusion.  Donis Kotowski S 09/10/2016,2:12 AM

## 2016-09-10 NOTE — Progress Notes (Signed)
At this time patient has refused to have foley placed. Explained physician's order/rationale for foley. Patient states he needs time to think about it.

## 2016-09-10 NOTE — Progress Notes (Signed)
*  PRELIMINARY RESULTS* Echocardiogram 2D Echocardiogram has been performed.  Collin [name] 09/10/2016, 10:55 AM

## 2016-09-10 NOTE — Progress Notes (Signed)
Bedside rounds with Dr. Margaretmary Eddy. Order to stop continuous fluids and heparin gtt.

## 2016-09-10 NOTE — Consult Note (Signed)
[name]  CARDIOLOGY CONSULT NOTE  Patient ID: Collin [name] MRN: [id number] DOB/AGE: 10-31-1940 75 y.o.  Admit date: 09/09/2016 Referring Physician  Primary Physician   Primary Cardiologist Dr. Ubaldo [name] Reason for Consultation abnormal troponin/chf  HPI: Patient is  a 75 year old male with history of systolic heart failure with a chronic ejection fraction of approximately 15% which is both ischemic and nonischemic, history of COPD with continued tobacco abuse, history of a chronic right pleural effusion with a Pleurx in place for approximately one year, history of nephrolithiasis status post right percutaneous nephrostomy tube placement. 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 He had been on Entresto as an outpatient and was doing well with this however was changed back to losartan and for cost issues. He states he began getting short of breath at that time. On presentation his chest x-ray revealed a large loculated right lateral pleural effusion. His pulmonary vasculature was normal with no evidence of left pleural effusion. EKG showed sinus arrhythmia with no ischemia. Initial serum troponin was 0.06 with a second troponin of 0.69 followed by a level of 0.03. Patient has no symptoms of ischemia. Transient elevation in troponin does not appear to suggest non-ST elevation myocardial  infarction or acute coronary syndrome. He is currently hemodynamically stable.  Review of Systems  HENT: Negative.   Eyes: Negative.   Respiratory: Positive for shortness of breath.   Cardiovascular: Positive for chest pain.  Gastrointestinal: Negative.   Genitourinary: Negative.   Musculoskeletal: Negative.   Skin: Negative.   Neurological: Positive for weakness.  Endo/Heme/Allergies: Negative.    Psychiatric/Behavioral: Negative.     Past Medical History:  Diagnosis Date  . Anemia   . Anxiety   . Arthritis   . Asthma   . Chronic systolic CHF (congestive heart failure) ([place])   . COPD (chronic obstructive pulmonary disease) ([place])   . GERD (gastroesophageal reflux disease)   . History of home oxygen therapy   . HLD (hyperlipidemia)   . HTN (hypertension)   . Hx of nephrostomy   . Hypertensive retinopathy   . Kidney stone   . Macular degeneration   . Peripheral vascular disease of lower extremity ([place])   . Pleural effusion   . Spinal stenosis of lumbar region     Family History  Problem Relation Age of Onset  . Heart attack Mother   . Stroke Mother   . Cancer Father   . COPD Father   . Heart attack Father     Social History   Social History  . Marital status: Married    Spouse name: N/A  . Number of children: N/A  . Years of education: N/A   Occupational History  . Not on file.   Social History Main Topics  . Smoking status: Current Some Day Smoker    Packs/day: 0.25    Years: 45.00    Types: Cigarettes  . Smokeless tobacco: Never Used  . Alcohol use No  . Drug use: No  . Sexual activity: No   Other Topics Concern  . Not on file   Social History Narrative  . No narrative on file    Past Surgical History:  Procedure Laterality Date  . ABDOMINAL AORTIC ANEURYSM REPAIR    . CHEST TUBE INSERTION Right 07/07/2015   Procedure: INSERTION PLEURAL DRAINAGE  CATHETER;  Surgeon: Collin Lewandowsky, MD;  Location: [facility] ORS;  Service: Thoracic;  Laterality: Right;  . HERNIA REPAIR     Umbilical  . IR GENERIC HISTORICAL  06/21/2016   IR NEPHROSTOMY EXCHANGE RIGHT 06/21/2016 Collin Mariscal, MD [facility]-INTERV RAD  . KIDNEY STONE SURGERY    . LEG AMPUTATION Left    above knee amputation  . NEPHROSTOMY TUBE PLACEMENT ([place] [facility]X)    . PERIPHERAL VASCULAR CATHETERIZATION N/A 11/03/2015   Procedure: Abdominal Aortogram w/Lower Extremity;  Surgeon: Collin Huxley, MD;  Location:  [facility];  Service: Cardiovascular;  Laterality: N/A;  . PERIPHERAL VASCULAR CATHETERIZATION  11/03/2015   Procedure: Lower Extremity Intervention;  Surgeon: Collin Huxley, MD;  Location: [place];  Service: Cardiovascular;;  . PERIPHERAL VASCULAR CATHETERIZATION Left 11/10/2015   Procedure: Lower Extremity Angiography;  Surgeon: Collin Huxley, MD;  Location: [facility];  Service: Cardiovascular;  Laterality: Left;  . PERIPHERAL VASCULAR CATHETERIZATION  11/10/2015   Procedure: Lower Extremity Intervention;  Surgeon: Collin Huxley, MD;  Location: [facility] [place] [facility];  Service: Cardiovascular;;  . peripheral vascular stent Left      Prescriptions Prior to Admission  Medication Sig Dispense Refill Last Dose  . acetaminophen (TYLENOL) 500 MG tablet Take 1,000 mg by mouth 3 (three) times daily.    06/20/2016 at Unknown time  . albuterol (PROVENTIL HFA;VENTOLIN HFA) 108 (90 BASE) MCG/ACT inhaler Inhale 2 puffs into the lungs every 4 (four) hours as needed for wheezing or shortness of breath.    06/20/2016 at Unknown time  . ALPRAZolam (XANAX) 0.25 MG tablet Take 1 tablet (0.25 mg total) by mouth 2 (two) times daily as needed for anxiety or sleep. (Patient not taking: Reported on 06/29/2016) 20 tablet 0 Not Taking at Unknown time  . aspirin EC 81 MG tablet Take 81 mg by mouth daily.   06/20/2016 at Unknown time  . atorvastatin (LIPITOR) 80 MG tablet Take 80 mg by mouth daily at 6 PM.     . bisoprolol (ZEBETA) 5 MG tablet Take 5 mg by mouth daily.     . cyanocobalamin 1000 MCG tablet Take 1,000 mcg by mouth daily.     Marland Kitchen eplerenone (INSPRA) 25 MG tablet TAKE 1 TABLET ONCE DAILY  0   . fluticasone furoate-vilanterol (BREO ELLIPTA) 100-25 MCG/INH AEPB Inhale 1 puff into the lungs daily.   06/20/2016 at Unknown time  . furosemide (LASIX) 80 MG tablet Take 1 tablet (80 mg total) by mouth 2 (two) times daily. (Patient taking differently: Take 80 mg by mouth daily. ) 60 tablet 0  06/20/2016 at Unknown time  . HYDROcodone-acetaminophen (NORCO) 10-325 MG tablet Take 1 tablet by mouth every 8 (eight) hours as needed for moderate pain. 20 tablet 0 Not Taking at Unknown time  . hydrocortisone 2.5 % cream Apply 1 application topically 3 (three) times daily as needed (ITCHING).      Marland Kitchen losartan (COZAAR) 50 MG tablet TAKE 1 TABLET BY MOUTH DAILY  3   . Multiple Vitamin (MULTIVITAMIN WITH MINERALS) TABS tablet Take 1 tablet by mouth daily.   06/20/2016 at Unknown time  . nitroGLYCERIN (NITROSTAT) 0.4 MG SL tablet Take 0.4 mg by mouth every 5 (five) minutes x 3 doses as needed for chest pain.    Completed Course at Unknown time  . omeprazole (PRILOSEC) 20 MG capsule Take 20 mg by mouth daily.    06/20/2016 at Unknown time  . Tetrahydrozoline HCl (VISINE OP)  Place 2 drops into both eyes 2 (two) times daily as needed (DRYNESS).     Marland Kitchen tiotropium (SPIRIVA HANDIHALER) 18 MCG inhalation capsule Place 1 capsule (18 mcg total) into inhaler and inhale daily. 30 capsule 0 06/20/2016 at Unknown time    Physical Exam: Blood pressure (!) 152/95, pulse 97, temperature 97.4 F (36.3 C), temperature source Oral, resp. rate 18, height 5\' 9"  (1.753 m), weight 187 lb 1.6 oz (84.9 kg), SpO2 97 %.   Wt Readings from Last 1 Encounters:  09/10/16 187 lb 1.6 oz (84.9 kg)     General appearance: alert and cooperative Resp: Left lung clear, diminished breath sounds on the rightThree quarters the way up. Chest wall: no tenderness Cardio: regular rate and rhythm GI: soft, non-tender; bowel sounds normal; no masses,  no organomegaly Extremities: extremities normal, atraumatic, no cyanosis or edema Pulses: 2+ and symmetric Neurologic: Grossly normal  Labs:   Lab Results  Component Value Date   WBC 8.3 09/10/2016   HGB 10.2 (L) 09/10/2016   HCT 34.3 (L) 09/10/2016   MCV 68.2 (L) 09/10/2016   PLT 275 09/10/2016    Recent Labs Lab 09/10/16 0604  NA 136  K 4.8  CL 110  CO2 19*  BUN 49*   CREATININE 1.63*  CALCIUM 8.8*  GLUCOSE 236*   Lab Results  Component Value Date   CKTOTAL 41 (L) 12/23/2014   CKMB 4.2 12/23/2014   TROPONINI 0.03 (HH) 09/10/2016      Radiology: Large loculated right pleural effusion EKG: Regular rate and rhythm with no ischemia  ASSESSMENT AND PLAN:  75 year old male with history of systolic heart failure with a chronic ejection fraction of approximately 15% which is both ischemic and nonischemic, history of COPD with continued tobacco abuse, history of a chronic right pleural effusion with a Pleurx in place for approximately one year, history of nephrolithiasis status post right percutaneous nephrostomy tube placement. 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 He had been on Entresto as an outpatient and was doing well with this however was changed back to losartan and for cost issues. He states he began getting short of breath at that time. On presentation his chest x-ray revealed a large loculated right lateral pleural effusion. His elevated troponin does not appear to suggest ischemia. Will discontinue losartan and place back on Entresto. We'll continue the remainder of his home medications. Continue follow-up as of his effusion by thoracic surgery. Further recommendations based on his course. He does not appear to be ischemic or infarct pulmonary edema present. Signed: Teodoro Spray MD, [facility] 09/10/2016, 10:28 AM

## 2016-09-10 NOTE — Plan of Care (Signed)
Problem: Health Behavior/Discharge Planning: Goal: Ability to manage health-related needs will improve Outcome: Not Progressing Patient is not in agreement with plan of care. Patient has reservations about interventions and refuses to be treated for certain procedures. Provided an explanation for rational. Spouse/Significant other tries to persuade patient to cooperate.

## 2016-09-10 NOTE — Progress Notes (Signed)
Troponin level was elevated notified Dr.willis, new orders placed.

## 2016-09-10 NOTE — Progress Notes (Signed)
CXR reveiwed  Patient with RT pleur-x catheter placed over 1 year ago Recommend CT chest and Evaluation by Dr Genevive Bi  Will wait for CT chest, note to follow    Corrin Parker, M.D.  [facility]  Medical Director [place] Director [facility] Cardio-Pulmonary Department

## 2016-09-10 NOTE — Progress Notes (Signed)
[facility] [place] NAME: Collin [name]    MR#:  [id number]  DATE OF BIRTH:  1941-07-02  SUBJECTIVE:  CHIEF COMPLAINT:  Patient has some productive cough and shortness of breath with exertion Patient with RT pleur-x catheter placed over 1 year ago   REVIEW OF SYSTEMS:  CONSTITUTIONAL: No fever, fatigue or weakness.  EYES: No blurred or double vision.  EARS, NOSE, AND THROAT: No tinnitus or ear pain.  RESPIRATORY: reports cough, exertional shortness of breath, no wheezing or hemoptysis.  Right-sided Pleurx catheter CARDIOVASCULAR: No chest pain, orthopnea, edema.  GASTROINTESTINAL: No nausea, vomiting, diarrhea or abdominal pain.  GENITOURINARY: No dysuria, hematuria. Left-sided nephrostomy tube ENDOCRINE: No polyuria, nocturia,  HEMATOLOGY: No anemia, easy bruising or bleeding SKIN: No rash or lesion. MUSCULOSKELETAL: No joint pain or arthritis.   NEUROLOGIC: No tingling, numbness, weakness.  PSYCHIATRY: No anxiety or depression.   DRUG ALLERGIES:   Allergies  Allergen Reactions  . Lisinopril Anaphylaxis  . Metoprolol Other (See Comments)    Reaction:  Ringing in ears   . Gabapentin Rash    VITALS:  Blood pressure (!) 147/95, pulse 98, temperature 97.6 F (36.4 C), temperature source Oral, resp. rate 19, height 5\' 9"  (1.753 m), weight 84.9 kg (187 lb 1.6 oz), SpO2 98 %.  PHYSICAL EXAMINATION:  GENERAL:  75 y.o.-year-old patient lying in the bed with no acute distress.  EYES: Pupils equal, round, reactive to light and accommodation. No scleral icterus. Extraocular muscles intact.  HEENT: Head atraumatic, normocephalic. Oropharynx and nasopharynx clear.  NECK:  Supple, no jugular venous distention. No thyroid enlargement, no tenderness.  LUNGS: Moderate breath sounds bilaterally, diminished on right side. Intact Pleurx catheter no wheezing, rales,rhonchi or crepitation. No use of accessory muscles of respiration.   CARDIOVASCULAR: S1, S2 normal. No murmurs, rubs, or gallops.  ABDOMEN: Soft, nontender, nondistended. Bowel sounds present. No organomegaly or mass.  EXTREMITIES:Left AKA No pedal edema, cyanosis, or clubbing. Has left-sided nephrostomy tube NEUROLOGIC: Cranial nerves II through XII are intact. Muscle strength 5/5 in all extremities. Sensation intact. Gait not checked.  PSYCHIATRIC: The patient is alert and oriented x 3.  SKIN: No obvious rash, lesion, or ulcer.    LABORATORY PANEL:   CBC  Recent Labs Lab 09/10/16 0604  WBC 8.3  HGB 10.2*  HCT 34.3*  PLT 275   ------------------------------------------------------------------------------------------------------------------  Chemistries   Recent Labs Lab 09/10/16 0604  NA 136  K 4.8  CL 110  CO2 19*  GLUCOSE 236*  BUN 49*  CREATININE 1.63*  CALCIUM 8.8*   ------------------------------------------------------------------------------------------------------------------  Cardiac Enzymes  Recent Labs Lab 09/10/16 1212  TROPONINI 0.03*   ------------------------------------------------------------------------------------------------------------------  RADIOLOGY:  Ct Chest Wo Contrast  Result Date: 09/10/2016 CLINICAL DATA:  Loculated right lateral pleural effusion. History a systolic are failure. Chronic ejection fracture of 15%. Chronic renal failure. EXAM: CT CHEST WITHOUT CONTRAST TECHNIQUE: Multidetector CT imaging of the chest was performed following the standard protocol without IV contrast. COMPARISON:  09/09/2016 abdominal ultrasound and CT of the chest on 03/04/2015 FINDINGS: Cardiovascular: Heart is enlarged. Extensive coronary artery calcifications. Trace pericardial effusion. Extensive atherosclerotic calcification of the thoracic aorta. Aorta is tortuous but not aneurysmal. Mediastinum/Nodes: The visualized portion of the thyroid gland has a normal appearance. Enlarged mediastinal lymph nodes are present.  Precarinal lymph node is 1.9 cm. AP window lymph node is 0.9 cm. Right hilar lymph node is 1.6 cm. Mediastinal adenopathy appears stable. Lungs/Pleura: Bilateral pleural effusions are identified.  On the left, effusion appears simple and is associated with atelectasis at the base. On the right, effusion is loculated. PleurX catheter appears well positioned with tip in the right lower component of the fusion, raising the question of patency of the catheter. Emphysematous changes are identified in the upper lobes. There is thickening of the inner septal lines the dependent aspect of the right lung. No pulmonary nodules. Airways are patent. Upper Abdomen: Infrarenal abdominal aortic aneurysm measures 4.7 x 3.9 cm. Left-sided hydronephrosis is new since December 2016. Left kidney is only partially imaged. Right kidney is not imaged. There is extensive atherosclerotic calcification of the aortic branches in the upper abdomen. Gallbladder is present. Calcified gallstone is present. Small amount of ascites is noted in the upper abdomen. Musculoskeletal: Degenerative changes are seen in the thoracic spine. IMPRESSION: 1. Loculated right pleural effusion despite appropriate position of the PleurX catheter. 2. Simple appearing left pleural effusion. 3. Enlarged mediastinal lymph nodes which may be reactive. 4. Cardiomegaly and coronary artery disease. 5. New left hydronephrosis. Consider CT of the abdomen and pelvis to evaluate for source of obstruction. 6. Small amount of abdominal ascites partially imaged. 7. Infrarenal abdominal aortic aneurysm measures 4.7 cm. Recommend followup by abdomen and pelvis CTA in 6 months, and vascular surgery referral/consultation if not already obtained. This recommendation follows ACR consensus guidelines: White Paper of the ACR Incidental Findings Committee II on Vascular Findings. J Am Coll Radiol 2013; 10:789-794. Electronically Signed   By: Nolon Nations M.D.   On: 09/10/2016 11:27    Dg Chest Portable 1 View  Result Date: 09/09/2016 CLINICAL DATA:  Progressive shortness of breath and productive cough. EXAM: PORTABLE CHEST 1 VIEW COMPARISON:  Chest x-rays dated 12/07/2015, 11/29/2015 and 10/26/2015 FINDINGS: There has been a marked increase in the loculated right lateral pleural effusion since the prior study of 12/07/2015. Chronic cardiomegaly. Pulmonary vascularity is normal. No appreciable left effusion. IMPRESSION: Large loculated right lateral pleural effusion. Electronically Signed   By: Lorriane Shire M.D.   On: 09/09/2016 19:03    EKG:   Orders placed or performed during the hospital encounter of 09/09/16  . EKG 12-Lead  . EKG 12-Lead    ASSESSMENT AND PLAN:     * Sepsis ([place]) - suspected source is infection in his effusion. 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 Discontinue IV fluids .     *Recurrent right pleural effusion -  CT chest has revealed loculated right-sided pleural effusion  -Appreciate pulmonology recommendations  -Consult placed to thoracic surgery Dr. Faith Rogue, patient has right-sided Pleurx catheter placed one year ago   *Left-sided hydronephrosis -CT chest reveals this finding Patient is urinating. Patient has nephrostomy tube CT abdomen and pelvis without contrast is ordered. Discussed with urology  *Infrarenal abdominal aortic aneurysm 4.7 cm The patient is asymptomatic. Vascular surgery consult placed  *Abnormal troponin-0.04-0.69-0.03-0.03 Troponin is elevated only once which could be a lab error. Patient is asymptomatic. Heparin  drip discontinued Cardiac consult placed , discussed with Dr. Ubaldo Glassing he is agreeable   * COPD (chronic obstructive pulmonary disease)  ([place]) -  no exacerbation continue home inhalers and medications, when necessary duo nebs  * Chronic systolic CHF (congestive heart failure) ([place]) - continue home meds, repeat echocardiogram in the  morning     follow-up with pulmonology, cardiology, thoracic surgery, vascular surgery   All the records are reviewed and case discussed with Care Management/Social Workerr. Management plans discussed with the patient, family and they are in agreement.  CODE STATUS: FC   TOTAL TIME TAKING CARE OF THIS PATIENT: 38  minutes.   POSSIBLE D/C IN 2-3  DAYS, DEPENDING ON CLINICAL CONDITION.  Note: This dictation was prepared with Dragon dictation along with smaller phrase technology. Any transcriptional errors that result from this process are unintentional.   Nicholes Mango M.D on 09/10/2016 at 2:11 PM  Between 7am to 6pm - Pager - [phone] After 6pm go to [email or web address] - password [email or web address] [facility]  Office  [phone]  CC: Primary care physician; [name], MD

## 2016-09-10 NOTE — Consult Note (Deleted)
Name: Collin [name] MRN: [id number] DOB: [birth year]    ADMISSION DATE:  09/09/2016 CONSULTATION DATE:  09/10/16  REFERRING MD :  Dr. Jannifer [name]  CHIEF COMPLAINT:  Increased  Shortness of brain  STUDIES:  None  HISTORY OF PRESENT ILLNESS:   Collin [name] is 75 yo male with PMH significant for CHF, COPD, GERD,PVD,Abdominal Aortic Aneurysm,  Right sided Pleural effusion with chest tube insertion(07/07/15) and Cardiac Catheterization(11/10/2015).  Patient presents to the ED on 12/30 with progressive shortness of breath.Patient has known right sided pleural effusion with Pleuradex Catheter. His wife states that over the last couple of months it has not been draining very much.She tried to drain the fluid today but she states "It would not come".  PCCM team was consulted for further management. PAST MEDICAL HISTORY :   has a past medical history of Anemia; Anxiety; Arthritis; Asthma; Chronic systolic CHF (congestive heart failure) ([place]); COPD (chronic obstructive pulmonary disease) ([place]); GERD (gastroesophageal reflux disease); History of home oxygen therapy; HLD (hyperlipidemia); HTN (hypertension); nephrostomy; Hypertensive retinopathy; Kidney stone; Macular degeneration; Peripheral vascular disease of lower extremity ([place]); Pleural effusion; and Spinal stenosis of lumbar region.  has a past surgical history that includes Abdominal aortic aneurysm repair; Kidney stone surgery; Chest tube insertion (Right, 07/07/2015); Cardiac catheterization (N/A, 11/03/2015); Cardiac catheterization (11/03/2015); Cardiac catheterization (Left, 11/10/2015); Cardiac catheterization (11/10/2015); peripheral vascular stent (Left); NEPHROSTOMY TUBE PLACEMENT ([place] [facility]); Leg amputation (Left); ir generic historical (06/21/2016); and Hernia repair. Prior to Admission medications   Medication Sig Start Date End Date Taking? Authorizing Provider  acetaminophen (TYLENOL) 500 MG tablet Take 1,000 mg by mouth 3 (three) times daily.      Historical Provider, MD  albuterol (PROVENTIL HFA;VENTOLIN HFA) 108 (90 BASE) MCG/ACT inhaler Inhale 2 puffs into the lungs every 4 (four) hours as needed for wheezing or shortness of breath.     Historical Provider, MD  ALPRAZolam Duanne Moron) 0.25 MG tablet Take 1 tablet (0.25 mg total) by mouth 2 (two) times daily as needed for anxiety or sleep. Patient not taking: Reported on 06/29/2016 12/10/15   Hillary Bow, MD  aspirin EC 81 MG tablet Take 81 mg by mouth daily.    Historical Provider, MD  atorvastatin (LIPITOR) 80 MG tablet Take 80 mg by mouth daily at 6 PM.    Historical Provider, MD  bisoprolol (ZEBETA) 5 MG tablet Take 5 mg by mouth daily.    Historical Provider, MD  cyanocobalamin 1000 MCG tablet Take 1,000 mcg by mouth daily.    Historical Provider, MD  eplerenone (INSPRA) 25 MG tablet TAKE 1 TABLET ONCE DAILY 08/02/16   Historical Provider, MD  fluticasone furoate-vilanterol (BREO ELLIPTA) 100-25 MCG/INH AEPB Inhale 1 puff into the lungs daily.    Historical Provider, MD  furosemide (LASIX) 80 MG tablet Take 1 tablet (80 mg total) by mouth 2 (two) times daily. Patient taking differently: Take 80 mg by mouth daily.  12/10/15   Hillary Bow, MD  HYDROcodone-acetaminophen (NORCO) 10-325 MG tablet Take 1 tablet by mouth every 8 (eight) hours as needed for moderate pain. 12/10/15   Srikar Sudini, MD  hydrocortisone 2.5 % cream Apply 1 application topically 3 (three) times daily as needed ([place]).     Historical Provider, MD  losartan (COZAAR) 50 MG tablet TAKE 1 TABLET BY MOUTH DAILY 08/02/16   Historical Provider, MD  Multiple Vitamin (MULTIVITAMIN WITH MINERALS) TABS tablet Take 1 tablet by mouth daily.    Historical Provider, MD  nitroGLYCERIN (NITROSTAT) 0.4 MG SL tablet  Take 0.4 mg by mouth every 5 (five) minutes x 3 doses as needed for chest pain.     Historical Provider, MD  omeprazole (PRILOSEC) 20 MG capsule Take 20 mg by mouth daily.     Historical Provider, MD  Tetrahydrozoline HCl  (VISINE OP) Place 2 drops into both eyes 2 (two) times daily as needed ([place]).    Historical Provider, MD  tiotropium (SPIRIVA HANDIHALER) 18 MCG inhalation capsule Place 1 capsule (18 mcg total) into inhaler and inhale daily. 03/06/15   Hillary Bow, MD   Allergies  Allergen Reactions  . Lisinopril Anaphylaxis  . Metoprolol Other (See Comments)    Reaction:  Ringing in ears   . Gabapentin Rash    FAMILY HISTORY:  family history includes COPD in his father; Cancer in his father; Heart attack in his father and mother; Stroke in his mother. SOCIAL HISTORY:  reports that he has been smoking Cigarettes.  He has a 11.25 pack-year smoking history. He has never used smokeless tobacco. He reports that he does not drink alcohol or use drugs.  REVIEW OF SYSTEMS:   Constitutional: Negative for fever, chills, weight loss, malaise/fatigue and diaphoresis.  HENT: Negative for hearing loss, ear pain, nosebleeds, congestion, sore throat, neck pain, tinnitus and ear discharge.   Eyes: Negative for blurred vision, double vision, photophobia, pain, discharge and redness.  Respiratory: Negative for cough, hemoptysis, sputum production, shortness of breath, wheezing and stridor.   Cardiovascular: Negative for chest pain, palpitations, orthopnea, claudication, leg swelling and PND.  Gastrointestinal: Negative for heartburn, nausea, vomiting, abdominal pain, diarrhea, constipation, blood in stool and melena.  Genitourinary: Negative for dysuria, urgency, frequency, hematuria and flank pain.  Musculoskeletal: Negative for myalgias, back pain, joint pain and falls.  Skin: Negative for itching and rash.  Neurological: Negative for dizziness, tingling, tremors, sensory change, speech change, focal weakness, seizures, loss of consciousness, weakness and headaches.  Endo/Heme/Allergies: Negative for environmental allergies and polydipsia. Does not bruise/bleed easily.  SUBJECTIVE:  Patient states that he has"  increasedshortness of breath and increased cough"  VITAL SIGNS: Temp:  [97.3 F (36.3 C)] 97.3 F (36.3 C) (12/30 1759) Pulse Rate:  [70-120] 96 (12/30 2302) Resp:  [18-28] 18 (12/30 2302) BP: (141-167)/(90-98) 150/96 (12/30 2302) SpO2:  [92 %-100 %] 99 % (12/30 2302) Weight:  [82.3 kg (181 lb 7 oz)-84.5 kg (186 lb 3.2 oz)] 84.5 kg (186 lb 3.2 oz) (12/30 2302)  PHYSICAL EXAMINATION: General:  Elderly Gentleman,found sitting on the bed and coughing Neuro:  Awake, Alert and oriented HEENT: AT,[place], No discharge, no JVD appreciated Cardiovascular:  S1S2,regular, no MRG noted Lungs:  Faint expiratory wheezes, no wheezes, crackles and rhonchi  Abdomen:  Soft. Non distended, Active bowel sounds Musculoskeletal:  No Inflammation/deformity noted Skin:  Grossly Intact   Recent Labs Lab 09/09/16 1817  NA 138  K 4.7  CL 107  CO2 21*  BUN 50*  CREATININE 1.74*  GLUCOSE 132*    Recent Labs Lab 09/09/16 1817  HGB 11.2*  HCT 38.0*  WBC 12.8*  PLT 355   Dg Chest Portable 1 View  Result Date: 09/09/2016 CLINICAL DATA:  Progressive shortness of breath and productive cough. EXAM: PORTABLE CHEST 1 VIEW COMPARISON:  Chest x-rays dated 12/07/2015, 11/29/2015 and 10/26/2015 FINDINGS: There has been a marked increase in the loculated right lateral pleural effusion since the prior study of 12/07/2015. Chronic cardiomegaly. Pulmonary vascularity is normal. No appreciable left effusion. IMPRESSION: Large loculated right lateral pleural effusion. Electronically Signed  By: Lorriane Shire M.D.   On: 09/09/2016 19:03    ASSESSMENT / PLAN:  Large loculated right lateral pleural effusion.-Right Pleuradex  In place(06/2015) Tobacco Abuse COPD Cough  PLAN Will obtain CT chest Lasix 40 MG x2 doses Tobacco cessation Counseling Robitussin DM F/U ECHO Rest per primary      Caroll Weinheimer,AG-ACNP [facility] [place]   09/10/2016, 12:25 AM

## 2016-09-11 DIAGNOSIS — N358 Other urethral stricture: Secondary | ICD-10-CM

## 2016-09-11 DIAGNOSIS — I5022 Chronic systolic (congestive) heart failure: Secondary | ICD-10-CM

## 2016-09-11 DIAGNOSIS — J90 Pleural effusion, not elsewhere classified: Secondary | ICD-10-CM

## 2016-09-11 DIAGNOSIS — N200 Calculus of kidney: Secondary | ICD-10-CM

## 2016-09-11 DIAGNOSIS — I714 Abdominal aortic aneurysm, without rupture: Secondary | ICD-10-CM

## 2016-09-11 DIAGNOSIS — N1330 Unspecified hydronephrosis: Secondary | ICD-10-CM

## 2016-09-11 DIAGNOSIS — N401 Enlarged prostate with lower urinary tract symptoms: Secondary | ICD-10-CM

## 2016-09-11 LAB — CBC WITH DIFFERENTIAL/PLATELET
BASOS PCT: 0 %
Basophils Absolute: 0 10*3/uL (ref 0–0.1)
EOS ABS: 0 10*3/uL (ref 0–0.7)
Eosinophils Relative: 0 %
HCT: 32 % — ABNORMAL LOW (ref 40.0–52.0)
HEMOGLOBIN: 9.9 g/dL — AB (ref 13.0–18.0)
LYMPHS PCT: 6 %
Lymphs Abs: 1.1 10*3/uL (ref 1.0–3.6)
MCH: 20.8 pg — AB (ref 26.0–34.0)
MCHC: 30.9 g/dL — ABNORMAL LOW (ref 32.0–36.0)
MCV: 67.3 fL — ABNORMAL LOW (ref 80.0–100.0)
MONOS PCT: 8 %
Monocytes Absolute: 1.4 10*3/uL — ABNORMAL HIGH (ref 0.2–1.0)
Neutro Abs: 15.5 10*3/uL — ABNORMAL HIGH (ref 1.4–6.5)
Neutrophils Relative %: 86 %
Platelets: 290 10*3/uL (ref 150–440)
RBC: 4.76 MIL/uL (ref 4.40–5.90)
RDW: 21.1 % — ABNORMAL HIGH (ref 11.5–14.5)
WBC: 18 10*3/uL — ABNORMAL HIGH (ref 3.8–10.6)
nRBC: 4 /100 WBC — ABNORMAL HIGH

## 2016-09-11 LAB — BASIC METABOLIC PANEL
Anion gap: 7 (ref 5–15)
BUN: 56 mg/dL — AB (ref 6–20)
CHLORIDE: 109 mmol/L (ref 101–111)
CO2: 21 mmol/L — ABNORMAL LOW (ref 22–32)
Calcium: 8.5 mg/dL — ABNORMAL LOW (ref 8.9–10.3)
Creatinine, Ser: 2.04 mg/dL — ABNORMAL HIGH (ref 0.61–1.24)
GFR calc Af Amer: 36 mL/min — ABNORMAL LOW (ref >60)
GFR calc non Af Amer: 31 mL/min — ABNORMAL LOW (ref >60)
Glucose, Bld: 172 mg/dL — ABNORMAL HIGH (ref 65–99)
POTASSIUM: 5.2 mmol/L — AB (ref 3.5–5.1)
SODIUM: 137 mmol/L (ref 135–145)

## 2016-09-11 LAB — ECHOCARDIOGRAM COMPLETE
Height: 69 in
Weight: 2993.6 oz

## 2016-09-11 LAB — URINE CULTURE

## 2016-09-11 MED ORDER — SODIUM POLYSTYRENE SULFONATE 15 GM/60ML PO SUSP
15.0000 g | Freq: Once | ORAL | Status: AC
  Administered 2016-09-11: 15 g via ORAL
  Filled 2016-09-11: qty 60

## 2016-09-11 MED ORDER — TAMSULOSIN HCL 0.4 MG PO CAPS
0.4000 mg | ORAL_CAPSULE | Freq: Every day | ORAL | Status: DC
  Administered 2016-09-11 – 2016-09-15 (×5): 0.4 mg via ORAL
  Filled 2016-09-11 (×5): qty 1

## 2016-09-11 NOTE — Progress Notes (Addendum)
[facility] NAME: Ether Provencio    MR#:  [id number]  DATE OF BIRTH:  10-31-1940  SUBJECTIVE:  CHIEF COMPLAINT:  Patient has  productive cough and refusing foley cath as per Uro recommendations  REVIEW OF SYSTEMS:  CONSTITUTIONAL: No fever, fatigue or weakness.  EYES: No blurred or double vision.  EARS, NOSE, AND THROAT: No tinnitus or ear pain.  RESPIRATORY: reports cough, exertional shortness of breath, no wheezing or hemoptysis.  Right-sided Pleurx catheter CARDIOVASCULAR: No chest pain, orthopnea, edema.  GASTROINTESTINAL: No nausea, vomiting, diarrhea or abdominal pain.  GENITOURINARY: No dysuria, hematuria. Left-sided nephrostomy tube ENDOCRINE: No polyuria, nocturia,  HEMATOLOGY: No anemia, easy bruising or bleeding SKIN: No rash or lesion. MUSCULOSKELETAL: No joint pain or arthritis.   NEUROLOGIC: No tingling, numbness, weakness.  PSYCHIATRY: No anxiety or depression.   DRUG ALLERGIES:   Allergies  Allergen Reactions  . Lisinopril Anaphylaxis  . Metoprolol Other (See Comments)    Reaction:  Ringing in ears   . Gabapentin Rash    VITALS:  Blood pressure 109/60, pulse (!) 118, temperature 97.4 F (36.3 C), temperature source Oral, resp. rate 19, height 5\' 9"  (1.753 m), weight 85.6 kg (188 lb 11.2 oz), SpO2 91 %.  PHYSICAL EXAMINATION:  GENERAL:  76 y.o.-year-old patient lying in the bed with no acute distress.  EYES: Pupils equal, round, reactive to light and accommodation. No scleral icterus. Extraocular muscles intact.  HEENT: Head atraumatic, normocephalic. Oropharynx and nasopharynx clear.  NECK:  Supple, no jugular venous distention. No thyroid enlargement, no tenderness.  LUNGS: Moderate breath sounds bilaterally, diminished on right side. Intact Pleurx catheter no wheezing, rales,rhonchi or crepitation. No use of accessory muscles of respiration.  CARDIOVASCULAR: S1, S2 normal. No murmurs, rubs, or  gallops.  ABDOMEN: Soft, nontender, nondistended. Bowel sounds present. No organomegaly or mass.  EXTREMITIES:Left AKA No pedal edema, cyanosis, or clubbing. Has left-sided nephrostomy tube NEUROLOGIC: Cranial nerves II through XII are intact. Muscle strength 5/5 in all extremities. Sensation intact. Gait not checked.  PSYCHIATRIC: The patient is alert and oriented x 3.  SKIN: No obvious rash, lesion, or ulcer.    LABORATORY PANEL:   CBC  Recent Labs Lab 09/11/16 0515  WBC 18.0*  HGB 9.9*  HCT 32.0*  PLT 290   ------------------------------------------------------------------------------------------------------------------  Chemistries   Recent Labs Lab 09/11/16 0515  NA 137  K 5.2*  CL 109  CO2 21*  GLUCOSE 172*  BUN 56*  CREATININE 2.04*  CALCIUM 8.5*   ------------------------------------------------------------------------------------------------------------------  Cardiac Enzymes  Recent Labs Lab 09/10/16 1212  TROPONINI 0.03*   ------------------------------------------------------------------------------------------------------------------  RADIOLOGY:  Ct Abdomen Pelvis Wo Contrast  Result Date: 09/10/2016 CLINICAL DATA:  Hydronephrosis. History of nephrolithiasis. Chronic kidney disease. EXAM: CT ABDOMEN AND PELVIS WITHOUT CONTRAST TECHNIQUE: Multidetector CT imaging of the abdomen and pelvis was performed following the standard protocol without IV contrast. COMPARISON:  CT 06/06/2015 FINDINGS: Lower chest: Partially loculated moderate right pleural effusion noted with PleurX catheter in place. Moderate left pleural effusion, free flowing and layering posteriorly. Atelectasis in both lower lobes. Mild cardiomegaly. Hepatobiliary: Small gallstone noted within the gallbladder. Scattered hypodensities in the liver, likely small cysts. No biliary ductal dilatation. Pancreas: No focal abnormality or ductal dilatation. Spleen: No focal abnormality.  Normal size.  Adrenals/Urinary Tract: Right nephrostomy catheter can't ureteral stent in place. No hydronephrosis on the right. There is severe left hydronephrosis. No visible ureteral stone. Multiple nonobstructing stones in the left kidney. Extensive  vascular calcifications in the renal hila bilaterally. 11 mm stone layering dependently in the urinary bladder. Gas noted in the urinary bladder, presumably from recent catheterization. Adrenal glands are unremarkable. Stomach/Bowel: Area of asymmetric wall thickening within the sigmoid colon concerning for possible mass/cancer, best seen on image 62 of series 2. Sigmoid diverticulosis. No active diverticulitis. Stomach and small bowel unremarkable. Vascular/Lymphatic: Prior aortic aneurysm repair. Diffuse vascular calcifications. No adenopathy. Reproductive: No visible focal abnormality. Other: Small amount of ascites noted adjacent to the liver and spleen. Musculoskeletal: No acute bony abnormality or focal bone lesion. IMPRESSION: Moderate bilateral pleural effusions, partially loculated on the right. Right PleurX catheter in place. Right nephrostomy catheter and ureteral stent in place. No hydronephrosis on the right. Moderate to severe left hydronephrosis without obstructing stone visualized. 11 mm stone layering dependently in the bladder. Small amount of free fluid adjacent to the liver and spleen. Cholelithiasis. Irregular asymmetric wall thickening within the sigmoid colon concerning for colon mass/cancer. Recommend further evaluation and tissue sampling with colonoscopy. Electronically Signed   By: Rolm Baptise M.D.   On: 09/10/2016 15:29   Ct Chest Wo Contrast  Result Date: 09/10/2016 CLINICAL DATA:  Loculated right lateral pleural effusion. History a systolic are failure. Chronic ejection fracture of 15%. Chronic renal failure. EXAM: CT CHEST WITHOUT CONTRAST TECHNIQUE: Multidetector CT imaging of the chest was performed following the standard protocol without IV  contrast. COMPARISON:  09/09/2016 abdominal ultrasound and CT of the chest on 03/04/2015 FINDINGS: Cardiovascular: Heart is enlarged. Extensive coronary artery calcifications. Trace pericardial effusion. Extensive atherosclerotic calcification of the thoracic aorta. Aorta is tortuous but not aneurysmal. Mediastinum/Nodes: The visualized portion of the thyroid gland has a normal appearance. Enlarged mediastinal lymph nodes are present. Precarinal lymph node is 1.9 cm. AP window lymph node is 0.9 cm. Right hilar lymph node is 1.6 cm. Mediastinal adenopathy appears stable. Lungs/Pleura: Bilateral pleural effusions are identified. On the left, effusion appears simple and is associated with atelectasis at the base. On the right, effusion is loculated. PleurX catheter appears well positioned with tip in the right lower component of the fusion, raising the question of patency of the catheter. Emphysematous changes are identified in the upper lobes. There is thickening of the inner septal lines the dependent aspect of the right lung. No pulmonary nodules. Airways are patent. Upper Abdomen: Infrarenal abdominal aortic aneurysm measures 4.7 x 3.9 cm. Left-sided hydronephrosis is new since December 2016. Left kidney is only partially imaged. Right kidney is not imaged. There is extensive atherosclerotic calcification of the aortic branches in the upper abdomen. Gallbladder is present. Calcified gallstone is present. Small amount of ascites is noted in the upper abdomen. Musculoskeletal: Degenerative changes are seen in the thoracic spine. IMPRESSION: 1. Loculated right pleural effusion despite appropriate position of the PleurX catheter. 2. Simple appearing left pleural effusion. 3. Enlarged mediastinal lymph nodes which may be reactive. 4. Cardiomegaly and coronary artery disease. 5. New left hydronephrosis. Consider CT of the abdomen and pelvis to evaluate for source of obstruction. 6. Small amount of abdominal ascites  partially imaged. 7. Infrarenal abdominal aortic aneurysm measures 4.7 cm. Recommend followup by abdomen and pelvis CTA in 6 months, and vascular surgery referral/consultation if not already obtained. This recommendation follows ACR consensus guidelines: White Paper of the ACR Incidental Findings Committee II on Vascular Findings. J Am Coll Radiol 2013; 10:789-794. Electronically Signed   By: Nolon Nations M.D.   On: 09/10/2016 11:27   Dg Chest Portable 1 View  Result  Date: 09/09/2016 CLINICAL DATA:  Progressive shortness of breath and productive cough. EXAM: PORTABLE CHEST 1 VIEW COMPARISON:  Chest x-rays dated 12/07/2015, 11/29/2015 and 10/26/2015 FINDINGS: There has been a marked increase in the loculated right lateral pleural effusion since the prior study of 12/07/2015. Chronic cardiomegaly. Pulmonary vascularity is normal. No appreciable left effusion. IMPRESSION: Large loculated right lateral pleural effusion. Electronically Signed   By: Lorriane Shire M.D.   On: 09/09/2016 19:03    EKG:   Orders placed or performed during the hospital encounter of 09/09/16  . EKG 12-Lead  . EKG 12-Lead    ASSESSMENT AND PLAN:     * Sepsis ([place]) - suspected source is infection in his effusion. 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 Discontinue IV fluids .     *Recurrent right pleural effusion -  CT chest has revealed loculated right-sided pleural effusion  -Appreciate pulmonology recommendations  -Consult placed to thoracic surgery Dr. Faith Rogue, patient has right-sided Pleurx catheter placed one year ago   *Left-sided hydronephrosis and nephrolithiasis, benign prostatic hypertrophy, meatus  stenosis Patient is urinating,But incomplete emptying , refusing Foley catheter placement , he will think about it  Flomax started  Patient has nephrostomy tube which is overdue to be changed, urology is recommending that to be changed by interventional radiology tomorrow Discussed with urology Patient is deferring meatotomy and urethroplasty for meatal stenosis  *Hyperkalemia with renal failure Kayexalate and discontinue spironolactone for now  check BMP in a.m.  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  The patient has aneurysmal degeneration of the proximal anastomosis of his open aneurysm repair performed in [place] in 2006.  Maximum diameter is 4.7.   Vascular is recommending follow-up ultrasound as an outpatient in 6 months.  *Abnormal troponin-0.04-0.69-0.03-0.03 Troponin is elevated only once which could be a lab error. Patient is asymptomatic. Heparin  drip discontinued Cardiac consult placed , discussed with Dr. Ubaldo Glassing he is agreeable   * COPD (chronic obstructive pulmonary disease) ([place]) -  no exacerbation continue home inhalers and medications, when necessary duo nebs  * Chronic systolic CHF (congestive heart failure) ([place]) - continue home meds, repeat echocardiogram in the morning     follow-up with pulmonology, cardiology, thoracic surgery, vascular surgery   All the records are reviewed and case discussed with Care Management/Social Workerr. Management plans discussed with the patient, wife and they are in agreement.  CODE STATUS: FC   TOTAL TIME TAKING CARE OF THIS PATIENT: 38  minutes.   POSSIBLE D/C IN 2-3  DAYS, DEPENDING ON CLINICAL CONDITION.  Note: This dictation was prepared with Dragon dictation along with smaller phrase technology. Any transcriptional errors that result from this process are unintentional.   Nicholes Mango M.D on 09/11/2016 at 4:09 PM  Between 7am to 6pm - Pager - [phone] After 6pm go to  [email or web address] - password [email or web address] [facility]  Office  [phone]  CC: Primary care physician; [name], MD

## 2016-09-11 NOTE — Progress Notes (Signed)
[facility] Consultation     Date: 09/11/2016,   MRN# [id number] TYRICO ROGUS [id number] Code Status:     Code Status Orders        Start     Ordered   09/09/16 2252  Full code  Continuous     09/09/16 2251    Code Status History    Date Active Date Inactive Code Status Order ID Comments User Context   12/07/2015  4:43 PM 12/10/2015  7:02 PM Full Code [id number]  Dustin Flock, MD ED   11/29/2015  8:27 PM 12/01/2015  4:15 PM Full Code [id number]  Sylvan Cheese, MD ED   10/26/2015 10:08 PM 10/29/2015  5:25 PM Full Code [id number]  Samson Frederic, DO Inpatient   08/22/2015  5:44 PM 08/31/2015  4:39 PM Full Code [id number]  Theodoro Grist, MD Inpatient   07/07/2015  1:57 PM 07/08/2015  2:28 PM Full Code [id number]  Nestor Lewandowsky, MD Inpatient   06/12/2015  3:54 PM 06/14/2015  5:01 PM Full Code [id number]  Baxter Hire, MD Inpatient   03/20/2015 12:20 AM 03/21/2015  9:25 PM Full Code [id number]  Lance Coon, MD ED   03/04/2015  5:52 AM 03/06/2015  2:26 PM Full Code [id number]  Lance Coon, MD Inpatient     Hosp day:@LENGTHOFSTAYDAYS @ Referring MD: @ATDPROV @     PCP:      AdmissionWeight: 82.3 kg (181 lb 7 oz)                 CurrentWeight: 85.6 kg (188 lb 11.2 oz) Nathanial Rancher Zaugg is a 76 y.o. old male seen in consultation for effusion at the request of Dr. 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 Genevive Bi in the past as per patient   Now sees Dr. Raul Del as outpatient Has daily drainage from catheter  No fevers, chills, NVD.  CT hest reveiwed, will need to get Dr. Genevive Bi opinion   ALLERGIES   Lisinopril; Metoprolol; and Gabapentin     REVIEW OF SYSTEMS   Review of Systems  Constitutional: Positive for  malaise/fatigue. Negative for chills and fever.  HENT: Negative for congestion.   Respiratory: Positive for shortness of breath. Negative for hemoptysis.   Cardiovascular: Negative for chest pain, palpitations, orthopnea and leg swelling.  Gastrointestinal: Negative for abdominal pain, heartburn, nausea and vomiting.  Musculoskeletal: Negative for myalgias.     VS: BP 124/68 (BP Location: Right Arm)   Pulse 81   Temp 97.5 F (36.4 C) (Oral)   Resp 18   Ht 5\' 9"  (1.753 m)   Wt 85.6 kg (188 lb 11.2 oz)   SpO2 98%   BMI 27.87 kg/m      PHYSICAL EXAM   Physical Exam  Constitutional: He is oriented to person, place, and time. No distress.  HENT:  Head: Atraumatic.  Eyes: Pupils are equal, round, and reactive to light.  Cardiovascular: Normal rate, regular rhythm and normal heart sounds.   No murmur heard. Pulmonary/Chest: Effort normal. No respiratory distress. He has no wheezes. He has rales.  Abdominal: Soft.  Musculoskeletal: He exhibits no edema.  Neurological: He is alert and oriented to person, place, and time.  Skin: Skin is warm. He  is not diaphoretic.        LABS    Recent Labs     09/09/16  1817  09/10/16  0139  09/10/16  0604  09/11/16  0515  HGB  11.2*   --   10.2*  9.9*  HCT  38.0*   --   34.3*  32.0*  MCV  68.9*   --   68.2*  67.3*  WBC  12.8*   --   8.3  18.0*  BUN  50*   --   49*  56*  CREATININE  1.74*   --   1.63*  2.04*  GLUCOSE  132*   --   236*  172*  CALCIUM  9.1   --   8.8*  8.5*  INR   --   1.43   --    --   ,       CULTURE RESULTS   Recent Results (from the past 240 hour(s))  Culture, blood (Routine X 2) w Reflex to ID Panel     Status: None (Preliminary result)   Collection Time: 09/09/16  6:21 PM  Result Value Ref Range Status   Specimen Description BLOOD LEFT WRIST  Final   Special Requests   Final    BOTTLES DRAWN AEROBIC AND ANAEROBIC AERB [place] ANA 14CC   Culture NO GROWTH 2 DAYS  Final   Report Status PENDING   Incomplete  Culture, blood (Routine X 2) w Reflex to ID Panel     Status: None (Preliminary result)   Collection Time: 09/09/16  6:21 PM  Result Value Ref Range Status   Specimen Description BLOOD LEFT ANTECUBITAL  Final   Special Requests   Final    BOTTLES DRAWN AEROBIC AND ANAEROBIC  ANA 12CC AER 13CC   Culture NO GROWTH 2 DAYS  Final   Report Status PENDING  Incomplete  Urine culture     Status: Abnormal   Collection Time: 09/09/16  9:18 PM  Result Value Ref Range Status   Specimen Description URINE, RANDOM  Final   Special Requests NONE  Final   Culture MULTIPLE SPECIES PRESENT, SUGGEST RECOLLECTION (A)  Final   Report Status 09/11/2016 FINAL  Final          IMAGING   CT chest RT sided loculated effusion Images reviewed   ASSESSMENT/PLAN   76 yo white male with chronic RT sided pleural effusion deemed to 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 Raul Del, which has now extended Will likely need definitive dx with VATS/pleural biopsy  Recommend CT surgery consult with Dr Genevive Bi. Dr. Raul Del to follow as this patient is followed by him as outpatient     The Patient requires high complexity decision making for assessment and support, frequent evaluation and titration of therapies, application of advanced monitoring technologies and extensive interpretation of multiple databases.   Patient/Family are satisfied with Plan of action and management. All questions answered   Corrin Parker, M.D.  [facility]  Medical Director [place] Director [facility] Cardio-Pulmonary Department
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
## 2016-09-11 NOTE — Consult Note (Signed)
Consult Note  Patient name: Collin [name] MRN: [id number] DOB: August 21, 1941 Sex: male  Consulting Physician:  Hospitalist service  Reason for Consult:  Chief Complaint  Patient presents with  . Respiratory Distress    HISTORY OF PRESENT ILLNESS: This is a 76 year old gentleman who was initially admitted with shortness of breath.  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  The patient has undergone percutaneous revascularization by Dr. dew in the past.  He has gone on to require a left leg amputation which was done at [facility].  He has a history of an open abdominal aortic aneurysm repair on an Ernest and 2006.  He has not had follow-up of this and quite a while.  He denies any abdominal pain or back pain.  The patient has multiple medical problems.  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  Past Medical History:  Diagnosis Date  . Anemia   . Anxiety   . Arthritis   . Asthma   . Chronic systolic CHF (congestive heart failure) ([place])   . COPD (chronic obstructive pulmonary disease) ([place])   . GERD (gastroesophageal reflux disease)   . History of home oxygen therapy   . HLD (hyperlipidemia)   . HTN (hypertension)   . Hx of nephrostomy   . Hypertensive retinopathy   . Kidney stone   . Macular degeneration   . Peripheral vascular disease of lower extremity ([place])   . Pleural effusion   . Spinal stenosis of lumbar region     Past Surgical History:  Procedure Laterality Date  . ABDOMINAL AORTIC ANEURYSM REPAIR    . CHEST TUBE INSERTION Right 07/07/2015   Procedure: INSERTION PLEURAL DRAINAGE CATHETER;  Surgeon: Nestor Lewandowsky, MD;  Location: [facility] ORS;  Service: Thoracic;  Laterality: Right;  . HERNIA REPAIR     Umbilical  . IR GENERIC HISTORICAL  06/21/2016   IR NEPHROSTOMY EXCHANGE RIGHT 06/21/2016 Sandi Mariscal, MD  [facility]-INTERV RAD  . KIDNEY STONE SURGERY    . LEG AMPUTATION Left    above knee amputation  . NEPHROSTOMY TUBE PLACEMENT ([place] HX)    . PERIPHERAL VASCULAR CATHETERIZATION N/A 11/03/2015   Procedure: Abdominal Aortogram w/Lower Extremity;  Surgeon: Algernon Huxley, MD;  Location: [place] [facility];  Service: Cardiovascular;  Laterality: N/A;  . PERIPHERAL VASCULAR CATHETERIZATION  11/03/2015   Procedure: Lower Extremity Intervention;  Surgeon: Algernon Huxley, MD;  Location: [place] [facility];  Service: Cardiovascular;;  . PERIPHERAL VASCULAR CATHETERIZATION Left 11/10/2015   Procedure: Lower Extremity Angiography;  Surgeon: Algernon Huxley, MD;  Location: [place] [facility];  Service: Cardiovascular;  Laterality: Left;  . PERIPHERAL VASCULAR CATHETERIZATION  11/10/2015   Procedure: Lower Extremity Intervention;  Surgeon: Algernon Huxley, MD;  Location: [place] [facility];  Service: Cardiovascular;;  . peripheral vascular stent Left     Social History   Social History  . Marital status: Married    Spouse name: N/A  . Number of children: N/A  . Years of education: N/A   Occupational History  . Not on file.   Social History Main Topics  . Smoking status: Current Some Day Smoker    Packs/day: 0.25    Years: 45.00    Types: Cigarettes  . Smokeless tobacco: Never Used  . Alcohol use No  . Drug  use: No  . Sexual activity: No   Other Topics Concern  . Not on file   Social History Narrative  . No narrative on file    Family History  Problem Relation Age of Onset  . Heart attack Mother   . Stroke Mother   . Cancer Father   . COPD Father   . Heart attack Father     Allergies as of 09/09/2016 - Review Complete 09/09/2016  Allergen Reaction Noted  . Lisinopril Anaphylaxis 03/04/2015  . Metoprolol Other (See Comments) 06/12/2015  . Gabapentin Rash 02/29/2016    No current facility-administered medications on file prior to encounter.    Current Outpatient Prescriptions on File  Prior to Encounter  Medication Sig Dispense Refill  . acetaminophen (TYLENOL) 500 MG tablet Take 1,000 mg by mouth 3 (three) times daily.     Marland Kitchen albuterol (PROVENTIL HFA;VENTOLIN HFA) 108 (90 BASE) MCG/ACT inhaler Inhale 2 puffs into the lungs every 4 (four) hours as needed for wheezing or shortness of breath.     . ALPRAZolam (XANAX) 0.25 MG tablet Take 1 tablet (0.25 mg total) by mouth 2 (two) times daily as needed for anxiety or sleep. (Patient not taking: Reported on 06/29/2016) 20 tablet 0  . aspirin EC 81 MG tablet Take 81 mg by mouth daily.    Marland Kitchen atorvastatin (LIPITOR) 80 MG tablet Take 80 mg by mouth daily at 6 PM.    . bisoprolol (ZEBETA) 5 MG tablet Take 5 mg by mouth daily.    . cyanocobalamin 1000 MCG tablet Take 1,000 mcg by mouth daily.    Marland Kitchen eplerenone (INSPRA) 25 MG tablet TAKE 1 TABLET ONCE DAILY  0  . fluticasone furoate-vilanterol (BREO ELLIPTA) 100-25 MCG/INH AEPB Inhale 1 puff into the lungs daily.    . furosemide (LASIX) 80 MG tablet Take 1 tablet (80 mg total) by mouth 2 (two) times daily. (Patient taking differently: Take 80 mg by mouth daily. ) 60 tablet 0  . HYDROcodone-acetaminophen (NORCO) 10-325 MG tablet Take 1 tablet by mouth every 8 (eight) hours as needed for moderate pain. 20 tablet 0  . hydrocortisone 2.5 % cream Apply 1 application topically 3 (three) times daily as needed (ITCHING).     Marland Kitchen losartan (COZAAR) 50 MG tablet TAKE 1 TABLET BY MOUTH DAILY  3  . Multiple Vitamin (MULTIVITAMIN WITH MINERALS) TABS tablet Take 1 tablet by mouth daily.    . nitroGLYCERIN (NITROSTAT) 0.4 MG SL tablet Take 0.4 mg by mouth every 5 (five) minutes x 3 doses as needed for chest pain.     Marland Kitchen omeprazole (PRILOSEC) 20 MG capsule Take 20 mg by mouth daily.     . Tetrahydrozoline HCl (VISINE OP) Place 2 drops into both eyes 2 (two) times daily as needed (DRYNESS).    Marland Kitchen tiotropium (SPIRIVA HANDIHALER) 18 MCG inhalation capsule Place 1 capsule (18 mcg total) into inhaler and inhale daily.  30 capsule 0     REVIEW OF SYSTEMS: Constitutional: Positive for malaise/fatigue. Negative for chills, fever and weight loss.  HENT: Negative for ear pain, hearing loss and tinnitus.   Eyes: Negative for blurred vision, double vision, pain and redness.  Respiratory: Positive for shortness of breath and wheezing. Negative for cough and hemoptysis.   Cardiovascular: Negative for chest pain, palpitations, orthopnea and leg swelling.  Gastrointestinal: Negative for abdominal pain, constipation, diarrhea, nausea and vomiting.  Genitourinary: Negative for dysuria, frequency and hematuria.  Musculoskeletal: Negative for back pain, joint pain and neck pain.  Skin:       No acne, rash, or lesions  Neurological: Negative for dizziness, tremors, focal weakness and weakness.  Endo/Heme/Allergies: Negative for polydipsia. Does not bruise/bleed easily.  Psychiatric/Behavioral: Negative for depression. The patient is not nervous/anxious and does not have insomnia.       PHYSICAL EXAMINATION: General: The patient appears their stated age.  Vital signs are BP 109/60 (BP Location: Left Arm)   Pulse (!) 118   Temp 97.4 F (36.3 C) (Oral)   Resp 19   Ht 5\' 9"  (1.753 m)   Wt 188 lb 11.2 oz (85.6 kg)   SpO2 91%   BMI 27.87 kg/m  Pulmonary: Respirations are non-labored HEENT:  No gross abnormalities Abdomen: Soft and non-tender .  I cannot palpate his aneurysm Musculoskeletal: There are no major deformities.   Neurologic: No focal weakness or paresthesias are detected, Skin: There are no ulcer or rashes noted. Psychiatric: The patient has normal affect. Cardiovascular: There is a regular rate and rhythm without significant murmur appreciated.  Diagnostic Studies: I have reviewed his CT scan today which shows a 4.7 cm aneurysmal degeneration just below the renal arteries    Assessment:  AAA Plan: The patient has aneurysmal degeneration of the proximal anastomosis of his open aneurysm repair  performed in [place] in 2006.  Maximum diameter is 4.7.  The patient is asymptomatic.  At this time I would recommend continued surveillance.  I will have him scheduled for follow-up ultrasound as an outpatient in 6 months.     Eldridge Abrahams, M.D. [facility] Office: [phone] Pager:  [phone]

## 2016-09-12 DIAGNOSIS — N136 Pyonephrosis: Secondary | ICD-10-CM

## 2016-09-12 LAB — CBC WITH DIFFERENTIAL/PLATELET
BAND NEUTROPHILS: 0 %
BASOS PCT: 0 %
Basophils Absolute: 0 10*3/uL (ref 0–0.1)
Blasts: 0 %
EOS ABS: 0.2 10*3/uL (ref 0–0.7)
Eosinophils Relative: 1 %
HCT: 34.1 % — ABNORMAL LOW (ref 40.0–52.0)
Hemoglobin: 10.2 g/dL — ABNORMAL LOW (ref 13.0–18.0)
LYMPHS PCT: 20 %
Lymphs Abs: 3 10*3/uL (ref 1.0–3.6)
MCH: 20.2 pg — ABNORMAL LOW (ref 26.0–34.0)
MCHC: 29.8 g/dL — AB (ref 32.0–36.0)
MCV: 67.8 fL — ABNORMAL LOW (ref 80.0–100.0)
MONO ABS: 1.4 10*3/uL — AB (ref 0.2–1.0)
Metamyelocytes Relative: 1 %
Monocytes Relative: 9 %
Myelocytes: 0 %
NEUTROS ABS: 10.5 10*3/uL — AB (ref 1.4–6.5)
Neutrophils Relative %: 69 %
OTHER: 0 %
PROMYELOCYTES ABS: 0 %
Platelets: 265 10*3/uL (ref 150–440)
RBC: 5.03 MIL/uL (ref 4.40–5.90)
RDW: 21.3 % — AB (ref 11.5–14.5)
WBC: 15.1 10*3/uL — ABNORMAL HIGH (ref 3.8–10.6)
nRBC: 4 /100 WBC — ABNORMAL HIGH

## 2016-09-12 LAB — BASIC METABOLIC PANEL
ANION GAP: 7 (ref 5–15)
BUN: 70 mg/dL — AB (ref 6–20)
CALCIUM: 8.6 mg/dL — AB (ref 8.9–10.3)
CO2: 22 mmol/L (ref 22–32)
Chloride: 105 mmol/L (ref 101–111)
Creatinine, Ser: 2.23 mg/dL — ABNORMAL HIGH (ref 0.61–1.24)
GFR calc Af Amer: 32 mL/min — ABNORMAL LOW (ref >60)
GFR calc non Af Amer: 28 mL/min — ABNORMAL LOW (ref >60)
GLUCOSE: 133 mg/dL — AB (ref 65–99)
Potassium: 5.4 mmol/L — ABNORMAL HIGH (ref 3.5–5.1)
Sodium: 134 mmol/L — ABNORMAL LOW (ref 135–145)

## 2016-09-12 LAB — POTASSIUM
Potassium: 5.1 mmol/L (ref 3.5–5.1)
Potassium: 5.3 mmol/L — ABNORMAL HIGH (ref 3.5–5.1)
Potassium: 5.1 mmol/L (ref 3.5–5.1)

## 2016-09-12 LAB — OSMOLALITY, URINE: Osmolality, Ur: 395 mOsm/kg (ref 300–900)

## 2016-09-12 LAB — CREATININE, URINE, RANDOM: Creatinine, Urine: 43 mg/dL

## 2016-09-12 MED ORDER — ALTEPLASE 2 MG IJ SOLR
Freq: Once | INTRAMUSCULAR | Status: AC
  Administered 2016-09-12: 15:00:00 via INTRAPLEURAL
  Filled 2016-09-12: qty 10

## 2016-09-12 MED ORDER — DEXTROSE 50 % IV SOLN
1.0000 | Freq: Once | INTRAVENOUS | Status: AC
  Administered 2016-09-12: 50 mL via INTRAVENOUS
  Filled 2016-09-12: qty 50

## 2016-09-12 MED ORDER — SODIUM POLYSTYRENE SULFONATE 15 GM/60ML PO SUSP: 15.0000 g | Freq: Once | ORAL | Status: DC

## 2016-09-12 MED ORDER — INSULIN ASPART 100 UNIT/ML IV SOLN
10.0000 [IU] | Freq: Once | INTRAVENOUS | Status: AC
  Administered 2016-09-12: 10 [IU] via INTRAVENOUS
  Filled 2016-09-12: qty 0.1

## 2016-09-12 MED ORDER — FUROSEMIDE 10 MG/ML IJ SOLN
80.0000 mg | Freq: Once | INTRAMUSCULAR | Status: AC
  Administered 2016-09-12: 80 mg via INTRAVENOUS
  Filled 2016-09-12: qty 8

## 2016-09-12 MED ORDER — SODIUM CHLORIDE 0.9 % IV SOLN
INTRAVENOUS | Status: DC
  Administered 2016-09-13: 13:00:00 via INTRAVENOUS
  Administered 2016-09-15: 10 mL/h via INTRAVENOUS

## 2016-09-12 NOTE — Progress Notes (Signed)
Urology Consult Follow Up  Subjective: Patient states that he is voiding with minor difficulty.  Nephrostomy tube is to be exchanged today.    Anti-infectives: Anti-infectives    Start     Dose/Rate Route Frequency Ordered Stop   09/10/16 0300  vancomycin (VANCOCIN) 1,250 mg in sodium chloride 0.9 % 250 mL IVPB     1,250 mg 166.7 mL/hr over 90 Minutes Intravenous Every 24 hours 09/09/16 2311     09/10/16 0200  meropenem (MERREM) 1 g in sodium chloride 0.9 % 100 mL IVPB  Status:  Discontinued     1 g 200 mL/hr over 30 Minutes Intravenous Every 12 hours 09/09/16 2311 09/09/16 2347   09/10/16 0200  meropenem (MERREM) IVPB SOLR 1 g     1 g 100 mL/hr over 30 Minutes Intravenous Every 12 hours 09/09/16 2349     09/09/16 1915  vancomycin (VANCOCIN) IVPB 1000 mg/200 mL premix     1,000 mg 200 mL/hr over 60 Minutes Intravenous  Once 09/09/16 1914 09/09/16 2146   09/09/16 1915  ceFEPIme (MAXIPIME) 1 GM / 41mL IVPB premix     1 g 100 mL/hr over 30 Minutes Intravenous STAT 09/09/16 1914 09/09/16 2027      Current Facility-Administered Medications  Medication Dose Route Frequency Provider Last Rate Last Dose  . acetaminophen (TYLENOL) tablet 650 mg  650 mg Oral Q6H PRN Lance Coon, MD   650 mg at 09/10/16 0308   Or  . acetaminophen (TYLENOL) suppository 650 mg  650 mg Rectal Q6H PRN Lance Coon, MD      . aspirin EC tablet 81 mg  81 mg Oral Daily Lance Coon, MD   81 mg at 09/11/16 0900  . atorvastatin (LIPITOR) tablet 80 mg  80 mg Oral q1800 Lance Coon, MD   80 mg at 09/11/16 1830  . bisoprolol (ZEBETA) tablet 5 mg  5 mg Oral Daily Lance Coon, MD   5 mg at 09/11/16 0859  . fluticasone furoate-vilanterol (BREO ELLIPTA) 100-25 MCG/INH 1 puff  1 puff Inhalation Daily Lance Coon, MD   1 puff at 09/11/16 0901  . guaiFENesin-dextromethorphan (ROBITUSSIN DM) 100-10 MG/5ML syrup 5 mL  5 mL Oral Q4H PRN Lance Coon, MD   5 mL at 09/11/16 1122  . HYDROcodone-acetaminophen (NORCO) 10-325 MG  per tablet 1 tablet  1 tablet Oral Q6H PRN Nicholes Mango, MD   1 tablet at 09/12/16 0334  . ipratropium-albuterol (DUONEB) 0.5-2.5 (3) MG/3ML nebulizer solution 3 mL  3 mL Nebulization Q4H PRN Lance Coon, MD   3 mL at 09/10/16 0458  . labetalol (NORMODYNE,TRANDATE) injection 10 mg  10 mg Intravenous Q2H PRN Lance Coon, MD   10 mg at 09/09/16 2153  . meropenem (MERREM) IVPB SOLR 1 g  1 g Intravenous Q12H Lance Coon, MD   1 g at 09/12/16 0210  . ondansetron (ZOFRAN) tablet 4 mg  4 mg Oral Q6H PRN Lance Coon, MD       Or  . ondansetron [facility]) injection 4 mg  4 mg Intravenous Q6H PRN Lance Coon, MD      . pantoprazole (PROTONIX) EC tablet 40 mg  40 mg Oral Daily Lance Coon, MD   40 mg at 09/11/16 0900  . sacubitril-valsartan (ENTRESTO) 24-26 mg per tablet  1 tablet Oral BID Teodoro Spray, MD   1 tablet at 09/11/16 2124  . sodium chloride flush (NS) 0.9 % injection 3 mL  3 mL Intravenous Q12H Lance Coon, MD   3  mL at 09/11/16 2200  . sodium polystyrene (KAYEXALATE) 15 GM/60ML suspension 15 g  15 g Oral Once Harrie Foreman, MD   Stopped at 09/12/16 [phone]  . tamsulosin (FLOMAX) capsule 0.4 mg  0.4 mg Oral Daily Nicholes Mango, MD   0.4 mg at 09/11/16 1122  . tetrahydrozoline 0.05 % ophthalmic solution 2 drop  2 drop Both Eyes BID PRN Lance Coon, MD      . tiotropium [facility]) inhalation capsule 18 mcg  18 mcg Inhalation Daily Lance Coon, MD   18 mcg at 09/11/16 0900  . vancomycin (VANCOCIN) 1,250 mg in sodium chloride 0.9 % 250 mL IVPB  1,250 mg Intravenous Q24H Lance Coon, MD   1,250 mg at 09/12/16 0210     Objective: Vital signs in last 24 hours: Temp:  [97.4 F (36.3 C)-97.9 F (36.6 C)] 97.9 F (36.6 C) (01/02 0457) Pulse Rate:  [59-118] 71 (01/02 0753) Resp:  [18-20] 20 (01/02 0753) BP: (107-130)/(60-74) 117/73 (01/02 0753) SpO2:  [91 %-98 %] 97 % (01/02 0753) Weight:  [191 lb 6.4 oz (86.8 kg)] 191 lb 6.4 oz (86.8 kg) (01/02 0500)  Intake/Output from previous  day: 01/01 0701 - 01/02 0700 In: 240 [P.O.:240] Out: 300 [Urine:300] Intake/Output this shift: No intake/output data recorded.   Physical Exam Constitutional: Well nourished. Alert and oriented, No acute distress. HEENT: [place] AT, moist mucus membranes. Trachea midline, no masses. Cardiovascular: No clubbing, cyanosis, or edema. Respiratory: Normal respiratory effort, no increased work of breathing. GI: Abdomen is soft, non tender, non distended, no abdominal masses. Liver and spleen not palpable.  No hernias appreciated.  Stool sample for occult testing is not indicated.   GU: No CVA tenderness.  No bladder fullness or masses.   Skin: No rashes, bruises or suspicious lesions. Lymph: No cervical or inguinal adenopathy. Neurologic: Grossly intact, no focal deficits, moving all 3 extremities.  Left AKA.   Psychiatric: Normal mood and affect.  Lab Results:   Recent Labs  09/11/16 0515 09/12/16 0230  WBC 18.0* 15.1*  HGB 9.9* 10.2*  HCT 32.0* 34.1*  PLT 290 265   BMET  Recent Labs  09/11/16 0515 09/12/16 0230  NA 137 134*  K 5.2* 5.4*  CL 109 105  CO2 21* 22  GLUCOSE 172* 133*  BUN 56* 70*  CREATININE 2.04* 2.23*  CALCIUM 8.5* 8.6*   PT/INR  Recent Labs  09/10/16 0139  LABPROT 17.6*  INR 1.43   ABG  Recent Labs  09/09/16 1817  HCO3 20.0    Studies/Results: Ct Abdomen Pelvis Wo Contrast  Result Date: 09/10/2016 CLINICAL DATA:  Hydronephrosis. History of nephrolithiasis. Chronic kidney disease. EXAM: CT ABDOMEN AND PELVIS WITHOUT CONTRAST TECHNIQUE: Multidetector CT imaging of the abdomen and pelvis was performed following the standard protocol without IV contrast. COMPARISON:  CT 06/06/2015 FINDINGS: Lower chest: Partially loculated moderate right pleural effusion noted with PleurX catheter in place. Moderate left pleural effusion, free flowing and layering posteriorly. Atelectasis in both lower lobes. Mild cardiomegaly. Hepatobiliary: Small gallstone noted  within the gallbladder. Scattered hypodensities in the liver, likely small cysts. No biliary ductal dilatation. Pancreas: No focal abnormality or ductal dilatation. Spleen: No focal abnormality.  Normal size. Adrenals/Urinary Tract: Right nephrostomy catheter can't ureteral stent in place. No hydronephrosis on the right. There is severe left hydronephrosis. No visible ureteral stone. Multiple nonobstructing stones in the left kidney. Extensive vascular calcifications in the renal hila bilaterally. 11 mm stone layering dependently in the urinary bladder. Gas noted in the  urinary bladder, presumably from recent catheterization. Adrenal glands are unremarkable. Stomach/Bowel: Area of asymmetric wall thickening within the sigmoid colon concerning for possible mass/cancer, best seen on image 62 of series 2. Sigmoid diverticulosis. No active diverticulitis. Stomach and small bowel unremarkable. Vascular/Lymphatic: Prior aortic aneurysm repair. Diffuse vascular calcifications. No adenopathy. Reproductive: No visible focal abnormality. Other: Small amount of ascites noted adjacent to the liver and spleen. Musculoskeletal: No acute bony abnormality or focal bone lesion. IMPRESSION: Moderate bilateral pleural effusions, partially loculated on the right. Right PleurX catheter in place. Right nephrostomy catheter and ureteral stent in place. No hydronephrosis on the right. Moderate to severe left hydronephrosis without obstructing stone visualized. 11 mm stone layering dependently in the bladder. Small amount of free fluid adjacent to the liver and spleen. Cholelithiasis. Irregular asymmetric wall thickening within the sigmoid colon concerning for colon mass/cancer. Recommend further evaluation and tissue sampling with colonoscopy. Electronically Signed   By: Rolm Baptise M.D.   On: 09/10/2016 15:29   Ct Chest Wo Contrast  Result Date: 09/10/2016 CLINICAL DATA:  Loculated right lateral pleural effusion. History a systolic  are failure. Chronic ejection fracture of 15%. Chronic renal failure. EXAM: CT CHEST WITHOUT CONTRAST TECHNIQUE: Multidetector CT imaging of the chest was performed following the standard protocol without IV contrast. COMPARISON:  09/09/2016 abdominal ultrasound and CT of the chest on 03/04/2015 FINDINGS: Cardiovascular: Heart is enlarged. Extensive coronary artery calcifications. Trace pericardial effusion. Extensive atherosclerotic calcification of the thoracic aorta. Aorta is tortuous but not aneurysmal. Mediastinum/Nodes: The visualized portion of the thyroid gland has a normal appearance. Enlarged mediastinal lymph nodes are present. Precarinal lymph node is 1.9 cm. AP window lymph node is 0.9 cm. Right hilar lymph node is 1.6 cm. Mediastinal adenopathy appears stable. Lungs/Pleura: Bilateral pleural effusions are identified. On the left, effusion appears simple and is associated with atelectasis at the base. On the right, effusion is loculated. PleurX catheter appears well positioned with tip in the right lower component of the fusion, raising the question of patency of the catheter. Emphysematous changes are identified in the upper lobes. There is thickening of the inner septal lines the dependent aspect of the right lung. No pulmonary nodules. Airways are patent. Upper Abdomen: Infrarenal abdominal aortic aneurysm measures 4.7 x 3.9 cm. Left-sided hydronephrosis is new since December 2016. Left kidney is only partially imaged. Right kidney is not imaged. There is extensive atherosclerotic calcification of the aortic branches in the upper abdomen. Gallbladder is present. Calcified gallstone is present. Small amount of ascites is noted in the upper abdomen. Musculoskeletal: Degenerative changes are seen in the thoracic spine. IMPRESSION: 1. Loculated right pleural effusion despite appropriate position of the PleurX catheter. 2. Simple appearing left pleural effusion. 3. Enlarged mediastinal lymph nodes which  may be reactive. 4. Cardiomegaly and coronary artery disease. 5. New left hydronephrosis. Consider CT of the abdomen and pelvis to evaluate for source of obstruction. 6. Small amount of abdominal ascites partially imaged. 7. Infrarenal abdominal aortic aneurysm measures 4.7 cm. Recommend followup by abdomen and pelvis CTA in 6 months, and vascular surgery referral/consultation if not already obtained. This recommendation follows ACR consensus guidelines: White Paper of the ACR Incidental Findings Committee II on Vascular Findings. J Am Coll Radiol 2013; 10:789-794. Electronically Signed   By: Nolon Nations M.D.   On: 09/10/2016 11:27    Impression/Assessment:  75yo with nephrolithiasis, left hydronephrosis, BPH with LUTS, incomplete emptying, meatal stenosis  Plan:  1. Nephrolithiasis: continue observation. The patient's nephrostomy tube is  scheduled to be changed by Interventional radiology 2. BPH with LUTS, incomplete emptying:  recommended foley catheter placement since the patient has bladder outlet obstruction with likely associated hydronephrosis. The patient refused foley catheter placement. Please start Flomax 0.4mg  daily 3. Meatal stenosis: patient defers treatment at this time.  4. Left hydronephrosis: He has refused foley placement. We will initiate alpha blocker therapy and follow his hydronephrosis as an outpatient.    LOS: 3 days    [facility] 09/12/2016

## 2016-09-12 NOTE — Progress Notes (Signed)
Notified MD Marcille Blanco of AM potassium 5.4, up from 5.2 yesterday. Patient was given 15g of kayexalate at 1830 on 1/1. MD to place new orders. Patient currently resting in bed in no acute distress. Nursing staff will continue to monitor for any changes in patient status. Earleen Reaper, RN

## 2016-09-12 NOTE — Progress Notes (Signed)
Attempted to place foley catheter. Attempt unsuccessful. MD notified. Will page urologist to see if they can place.

## 2016-09-12 NOTE — Care Management (Signed)
Patient presents from home where lives with his wife.  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  it stopped draining several months ago.   Thoracic surgeon is going to try TPA through current catheter.  Also has nephrostomy tube of chronic nature but needs to be replaced.  Vascular consult to follow up aneurysm and no intervention needed at this time. He has chronic home 02 through [place]. has a walker and wheelchair at home.    Says he has never had home health services and "in no way will I consider skilled nursing."  Discussed with patient it may be of benefit to bring prosthesis to the hospital.  Patient says that he does not need it because he is too sick to get up and walk."  Denies issues accessing medical care, obtaining medications or with transportation. Requested PT and OT consults.  Discussed during progression of need to have prosthesis brought to hospital

## 2016-09-12 NOTE — Progress Notes (Signed)
Pharmacy Antibiotic Note  Collin [name] is a 76 y.o. male admitted on 09/09/2016 with sepsis Pharmacy has been consulted for vancomycin and meropenem dosing.  This is day #3 of IV antibiotics.   Plan: Patient has been receiving vancomycin 1250 mg IV q24h, however SCr has increased since admission. Given worsening renal function, I calculate estimated vancomycin trough of ~23 mcg/mL and anticipate patient will need a lower dose.  Vancomycin level was ordered to be drawn prior to dose on 1/2 but unfortunately dose was given prior to vancomycin level being able to be drawn. SCr increased again today. I have discontinued current vancomycin order and will check a level on 1/3 at 0200 (24 hour post-dose) to assess dose.  Goal vancomycin trough 15-20 mcg/mL  Current kinetic calculations are as follows but may be inaccurate due to worsening renal function: Adjusted body weight = 76 kg Ke: 0.031 Half-life: 22 hrs Vd: 53 L Cmin ~23 mcg/mL  Continue meropenem 1 gram IV q12h  Height: 5\' 9"  (175.3 cm) Weight: 191 lb 6.4 oz (86.8 kg) IBW/kg (Calculated) : 70.7  Temp (24hrs), Avg:97.7 F (36.5 C), Min:97.4 F (36.3 C), Max:97.9 F (36.6 C)   Recent Labs Lab 09/09/16 1817 09/09/16 2143 09/10/16 0139 09/10/16 0604 09/11/16 0515 09/12/16 0230  WBC 12.8*  --   --  8.3 18.0* 15.1*  CREATININE 1.74*  --   --  1.63* 2.04* 2.23*  LATICACIDVEN 2.5* 2.5* 2.3* 1.5  --   --     Estimated Creatinine Clearance: 32.2 mL/min (by C-G formula based on SCr of 2.23 mg/dL (H)).    Allergies  Allergen Reactions  . Lisinopril Anaphylaxis  . Metoprolol Other (See Comments)    Reaction:  Ringing in ears   . Gabapentin Rash    Antimicrobials this admission: vancomycin 12/30 >>  meropenem 12/31 >>  Cefepime x1 in ED  Dose adjustments this admission:  Microbiology results: 12/30 BCx: No growth 3 days 12/30 UCx: multiple species, suggest recollection  Thank you for allowing pharmacy to be a part of  this patient's care.  Lenis Noon, PharmD, BCPS Clinical Pharmacist 09/12/2016 11:43 AM

## 2016-09-12 NOTE — Care Management Important Message (Signed)
Important Message  Patient Details  Name: Collin [name] MRN: [id number] Date of Birth: [birth year]   Medicare Important Message Given:  Yes  Initial signed IM printed from Epic and given to patient.     Katrina Stack, RN 09/12/2016, 8:56 AM

## 2016-09-12 NOTE — Progress Notes (Signed)
  Patient ID: Collin [name], male   DOB: 1941/06/11, 76 y.o.   MRN: [id number]  HISTORY: This patient is well known to me. He has a history of renal failure and congestive heart failure with a chronic right-sided pleural effusion. Proximally a year ago I placed a right-sided Pleurx catheter and his wife has been managing this at home. She states that she's been draining it every couple of days but several months ago the drainage was essentially nonexistent and she stopped. She states that she has tried to drain it every so often but there is been no drainage from the catheter in several months. She did follow-up with Dr. Raul Del and supposedly an x-ray was made which did not reveal any evidence of fluid. However the patient was admitted to the hospital with increasing shortness of breath and a chest x-ray showed a large right-sided loculated pleural effusion and a smaller left-sided free-flowing effusion. The Pleurx catheter was in place and appeared to be in good position. The patient also has a history of a right sided percutaneous nephrostomy tube and he is scheduled to undergo exchange of that tube today. He is being followed by the urology service.   Vitals:   09/12/16 0559 09/12/16 0753  BP: 130/74 117/73  Pulse: 71 71  Resp: 18 20  Temp:       EXAM:    Resp: Lungs Show bilateral rhonchi and rales. There are diminished throughout..  No respiratory distress, normal effort. Heart:  Regular without murmurs Abd:  Abdomen is soft, non distended and non tender. No masses are palpable.  There is no rebound and no guarding.  Neurological: Alert and oriented to person, place, and time. Coordination normal.  Skin: Skin is warm and dry. No rash noted. No diaphoretic. No erythema. No pallor.  the Pleurx catheter site is clean and dry. There is no erythema or drainage  Psychiatric: Normal mood and affect. Normal behavior. Judgment and thought content normal.    ASSESSMENT: I have independently  reviewed the chest x-ray. There is a large right-sided loculated pleural effusion. There is a more free-flowing left-sided pleural effusion.   PLAN:   I had a long discussion with his wife and him regarding the options. I think it would be reasonable to try TPA through the existing Pleurx catheter. He understands the indications and risks and would like to proceed. We will try to do that today.    Nestor Lewandowsky, MD

## 2016-09-12 NOTE — Progress Notes (Signed)
Urology paged twice. Awaiting response to see if urologist can place foley.

## 2016-09-12 NOTE — Progress Notes (Signed)
Attempted to drain PleurX catheter at 1830. Very few drops came out of catheter. MD Oaks notified.

## 2016-09-12 NOTE — Plan of Care (Signed)
Problem: Safety: Goal: Ability to remain free from injury will improve Outcome: Progressing Bed alarm on, patient remains free of fall/injury, calls for assistance.  Problem: Health Behavior/Discharge Planning: Goal: Ability to manage health-related needs will improve Outcome: Not Progressing Patient noncompliant with foley catheter insertion. Patient doesn't seem to comprehend importance of health issues and related interventions even after education from RN and MD.

## 2016-09-12 NOTE — Progress Notes (Signed)
Subjective:  1 - Nephrolithiasis - large volume Rt > Lt nephrolithaisis managed at present with Rt neproureteral stent, may eventually get PCNL if becomes operative candidate.  2 - Acute on Chronic Renal Failure - baseline Cr 1.5-1.75 x years. Acute rise to 2.23 durting this admission. PVR 36mL (emptying bladder completely), reciiving lasix + vanc. Imaging with approx stable chronic bilateral hydro.  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 Presently not bothored by obstructive voiding complaints. PVR 09/12/16 "61mL" (normal).    4 - Sepsis of Unknown Origin - lactic acidosis and ? Sepsis on admit. BCX negative. UCX non-clonal.  Today "Colburn" is seen to consider catheter placement. He reports no change in baseline voiding status and is presently emptying bladder well.   Objective: Vital signs in last 24 hours: Temp:  [97.6 F (36.4 C)-97.9 F (36.6 C)] 97.6 F (36.4 C) (01/02 1216) Pulse Rate:  [58-71] 58 (01/02 1216) Resp:  [18-21] 21 (01/02 1216) BP: (107-130)/(64-74) 119/64 (01/02 1216) SpO2:  [96 %-98 %] 97 % (01/02 1216) Weight:  [86.8 kg (191 lb 6.4 oz)] 86.8 kg (191 lb 6.4 oz) (01/02 0500) Last BM Date: 09/12/16  Intake/Output from previous day: 01/01 0701 - 01/02 0700 In: 240 [P.O.:240] Out: 300 [Urine:300] Intake/Output this shift: Total I/O In: -  Out: 600 [Urine:600]  General appearance: alert, cooperative, appears stated age and family at bedside.  Eyes: negative Nose: Nares normal. Septum midline. Mucosa normal. No drainage or sinus tenderness. Throat: lips, mucosa, and tongue normal; teeth and gums normal Neck: supple, symmetrical, trachea midline Resp: shallow, mild increase WOB in [place] O2.  GI: soft, non-tender; bowel sounds normal; no masses,  no organomegaly and large midline scar w/o hernias.  Male genitalia: normal, bladder non-palpable. Copious clear urine from Rt nephroureteral tube. clear yellow urine in bedside urinal.   Neurologic: Grossly normal  Lab Results:   Recent Labs  09/11/16 0515 09/12/16 0230  WBC 18.0* 15.1*  HGB 9.9* 10.2*  HCT 32.0* 34.1*  PLT 290 265   BMET  Recent Labs  09/11/16 0515 09/12/16 0230  09/12/16 1304 09/12/16 1549  NA 137 134*  --   --   --   K 5.2* 5.4*  < > 5.3* 5.1  CL 109 105  --   --   --   CO2 21* 22  --   --   --   GLUCOSE 172* 133*  --   --   --   BUN 56* 70*  --   --   --   CREATININE 2.04* 2.23*  --   --   --   CALCIUM 8.5* 8.6*  --   --   --   < > = values in this interval not displayed. PT/INR  Recent Labs  09/10/16 0139  LABPROT 17.6*  INR 1.43   ABG No results for input(s): PHART, HCO3 in the last 72 hours.  Invalid input(s): PCO2, PO2  Studies/Results: No results found.  Anti-infectives: Anti-infectives    Start     Dose/Rate Route Frequency Ordered Stop   09/10/16 0300  vancomycin (VANCOCIN) 1,250 mg in sodium chloride 0.9 % 250 mL IVPB  Status:  Discontinued     1,250 mg 166.7 mL/hr over 90 Minutes Intravenous Every 24 hours 09/09/16 2311 09/12/16 1143   09/10/16 0200  meropenem (MERREM) 1 g in sodium chloride 0.9 % 100 mL IVPB  Status:  Discontinued     1 g 200 mL/hr over 30 Minutes Intravenous  Every 12 hours 09/09/16 2311 09/09/16 2347   09/10/16 0200  meropenem (MERREM) IVPB SOLR 1 g     1 g 100 mL/hr over 30 Minutes Intravenous Every 12 hours 09/09/16 2349     09/09/16 1915  vancomycin (VANCOCIN) IVPB 1000 mg/200 mL premix     1,000 mg 200 mL/hr over 60 Minutes Intravenous  Once 09/09/16 1914 09/09/16 2146   09/09/16 1915  ceFEPIme (MAXIPIME) 1 GM / 75mL IVPB premix     1 g 100 mL/hr over 30 Minutes Intravenous STAT 09/09/16 1914 09/09/16 2027      Assessment/Plan:  1 - Nephrolithiasis - consider PCNL if becomes operative candidate, this seems unlikely given his CV comorbidity.   2 - Acute on Chronic Renal Failure - present Cr rise most likely pre-renal + vanco induced. No indication for additional tubes /  drains. I do agree with Rt nephroureteral stent exchange as he is due for this.   3 - Urethral Stricture - asymptomatic. No indication for putting him through aggressive dilation procedures as he empties well.   4 - Sepsis of Unknown Origin - low suspicion for urinary soursce as UCX non-clonal.   Please call with questions.   [facility], Nuchem Grattan 09/12/2016

## 2016-09-12 NOTE — Progress Notes (Signed)
Spoke with Dr. Tresa Moore from urology. Will bladder scan patient to see if he has any residual urine and Dr. Tresa Moore will come and place foley catheter later if appropriate.

## 2016-09-12 NOTE — Progress Notes (Signed)
[facility] [place] NAME: Collin [name]    MR#:  [id number]  DATE OF BIRTH:  Feb 09, 1941  SUBJECTIVE:  CHIEF COMPLAINT:  Patient has  productive cough and agreed for foley cath as per Uro recommendations, Urology will place it today  REVIEW OF SYSTEMS:  CONSTITUTIONAL: No fever, fatigue or weakness.  EYES: No blurred or double vision.  EARS, NOSE, AND THROAT: No tinnitus or ear pain.  RESPIRATORY: reports cough, exertional shortness of breath, no wheezing or hemoptysis.  Right-sided Pleurx catheter CARDIOVASCULAR: No chest pain, orthopnea, edema.  GASTROINTESTINAL: No nausea, vomiting, diarrhea or abdominal pain.  GENITOURINARY: No dysuria, hematuria. Left-sided nephrostomy tube ENDOCRINE: No polyuria, nocturia,  HEMATOLOGY: No anemia, easy bruising or bleeding SKIN: No rash or lesion. MUSCULOSKELETAL: No joint pain or arthritis.   NEUROLOGIC: No tingling, numbness, weakness.  PSYCHIATRY: No anxiety or depression.   DRUG ALLERGIES:   Allergies  Allergen Reactions  . Lisinopril Anaphylaxis  . Metoprolol Other (See Comments)    Reaction:  Ringing in ears   . Gabapentin Rash    VITALS:  Blood pressure 119/64, pulse (!) 58, temperature 97.6 F (36.4 C), temperature source Oral, resp. rate (!) 21, height 5\' 9"  (1.753 m), weight 86.8 kg (191 lb 6.4 oz), SpO2 97 %.  PHYSICAL EXAMINATION:  GENERAL:  76 y.o.-year-old patient lying in the bed with no acute distress.  EYES: Pupils equal, round, reactive to light and accommodation. No scleral icterus. Extraocular muscles intact.  HEENT: Head atraumatic, normocephalic. Oropharynx and nasopharynx clear.  NECK:  Supple, no jugular venous distention. No thyroid enlargement, no tenderness.  LUNGS: Moderate breath sounds bilaterally, diminished on right side. Intact Pleurx catheter no wheezing, rales,rhonchi or crepitation. No use of accessory muscles of respiration.  CARDIOVASCULAR: S1, S2  normal. No murmurs, rubs, or gallops.  ABDOMEN: Soft, nontender, nondistended. Bowel sounds present. No organomegaly or mass.  EXTREMITIES:Left AKA No pedal edema, cyanosis, or clubbing. Has left-sided nephrostomy tube NEUROLOGIC: Cranial nerves II through XII are intact. Muscle strength 5/5 in all extremities. Sensation intact. Gait not checked.  PSYCHIATRIC: The patient is alert and oriented x 3.  SKIN: No obvious rash, lesion, or ulcer.    LABORATORY PANEL:   CBC  Recent Labs Lab 09/12/16 0230  WBC 15.1*  HGB 10.2*  HCT 34.1*  PLT 265   ------------------------------------------------------------------------------------------------------------------  Chemistries   Recent Labs Lab 09/12/16 0230  09/12/16 1304  NA 134*  --   --   K 5.4*  < > 5.3*  CL 105  --   --   CO2 22  --   --   GLUCOSE 133*  --   --   BUN 70*  --   --   CREATININE 2.23*  --   --   CALCIUM 8.6*  --   --   < > = values in this interval not displayed. ------------------------------------------------------------------------------------------------------------------  Cardiac Enzymes  Recent Labs Lab 09/10/16 1212  TROPONINI 0.03*   ------------------------------------------------------------------------------------------------------------------  RADIOLOGY:  Ct Abdomen Pelvis Wo Contrast  Result Date: 09/10/2016 CLINICAL DATA:  Hydronephrosis. History of nephrolithiasis. Chronic kidney disease. EXAM: CT ABDOMEN AND PELVIS WITHOUT CONTRAST TECHNIQUE: Multidetector CT imaging of the abdomen and pelvis was performed following the standard protocol without IV contrast. COMPARISON:  CT 06/06/2015 FINDINGS: Lower chest: Partially loculated moderate right pleural effusion noted with PleurX catheter in place. Moderate left pleural effusion, free flowing and layering posteriorly. Atelectasis in both lower lobes. Mild cardiomegaly. Hepatobiliary: Small  gallstone noted within the gallbladder. Scattered  hypodensities in the liver, likely small cysts. No biliary ductal dilatation. Pancreas: No focal abnormality or ductal dilatation. Spleen: No focal abnormality.  Normal size. Adrenals/Urinary Tract: Right nephrostomy catheter can't ureteral stent in place. No hydronephrosis on the right. There is severe left hydronephrosis. No visible ureteral stone. Multiple nonobstructing stones in the left kidney. Extensive vascular calcifications in the renal hila bilaterally. 11 mm stone layering dependently in the urinary bladder. Gas noted in the urinary bladder, presumably from recent catheterization. Adrenal glands are unremarkable. Stomach/Bowel: Area of asymmetric wall thickening within the sigmoid colon concerning for possible mass/cancer, best seen on image 62 of series 2. Sigmoid diverticulosis. No active diverticulitis. Stomach and small bowel unremarkable. Vascular/Lymphatic: Prior aortic aneurysm repair. Diffuse vascular calcifications. No adenopathy. Reproductive: No visible focal abnormality. Other: Small amount of ascites noted adjacent to the liver and spleen. Musculoskeletal: No acute bony abnormality or focal bone lesion. IMPRESSION: Moderate bilateral pleural effusions, partially loculated on the right. Right PleurX catheter in place. Right nephrostomy catheter and ureteral stent in place. No hydronephrosis on the right. Moderate to severe left hydronephrosis without obstructing stone visualized. 11 mm stone layering dependently in the bladder. Small amount of free fluid adjacent to the liver and spleen. Cholelithiasis. Irregular asymmetric wall thickening within the sigmoid colon concerning for colon mass/cancer. Recommend further evaluation and tissue sampling with colonoscopy. Electronically Signed   By: Rolm Baptise M.D.   On: 09/10/2016 15:29    EKG:   Orders placed or performed during the hospital encounter of 09/09/16  . EKG 12-Lead  . EKG 12-Lead  . EKG 12-Lead  . EKG 12-Lead    ASSESSMENT  AND PLAN:     * Sepsis ([place]) - suspected source is infection in his effusion. 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 Discontinue IV fluids .     *Recurrent right pleural effusion -  CT chest has revealed loculated right-sided pleural effusion  -Appreciate pulmonology recommendations  -Consult placed to thoracic surgery Dr. Faith Rogue, patient has right-sided Pleurx catheter placed one year ago , will place TPA into Pleurex cath today  *Left-sided hydronephrosis and nephrolithiasis, benign prostatic hypertrophy, meatus stenosis Patient is urinating,But incomplete emptying , refusing Foley catheter placement , he will think about it  Flomax started  Patient has nephrostomy tube which is overdue to be changed, urology is recommending that to be changed by interventional radiology tomorrow, that is planning to replace it tomorrow Discussed with urology-we will place a Foley catheter today, RN could not place it because of the meatal stenosis Patient is deferring meatotomy and urethroplasty for meatal stenosis  *Hyperkalemia with renal failure Kayexalate 15 g given yesterday and today and discontinue spironolactone for now  check BMP in a.m. 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  The  patient has aneurysmal degeneration of the proximal anastomosis of  his open aneurysm repair performed in [place] in 2006.  Maximum diameter is 4.7.   Vascular is recommending follow-up ultrasound as an outpatient in 6 months.  *Abnormal troponin-0.04-0.69-0.03-0.03 Troponin is elevated only once which could be a lab error. Patient is asymptomatic. Heparin  drip discontinued Cardiac consult placed , discussed with Dr. Ubaldo Glassing he is agreeable   * COPD (chronic obstructive pulmonary disease) ([place]) -  no exacerbation continue home inhalers and medications, when necessary duo nebs  * Chronic systolic CHF (congestive heart failure) ([place]) - continue home meds, repeat echocardiogram 10-15% ejection fraction     follow-up with urology, pulmonology, cardiology, thoracic surgery, vascular surgery   All the records are reviewed and case discussed with Care Management/Social Workerr. Management plans discussed with the patient, wife and they are in agreement.  CODE STATUS: FC   TOTAL TIME TAKING CARE OF THIS PATIENT: 38  minutes.   POSSIBLE D/C IN 2-3  DAYS, DEPENDING ON CLINICAL CONDITION.  Note: This dictation was prepared with Dragon dictation along with smaller phrase technology. Any transcriptional errors that result from this process are unintentional.   Nicholes Mango M.D on 09/12/2016 at 1:51 PM  Between 7am to 6pm - Pager - [phone] After 6pm go to [email or web address] - password [email or web address] [place] [facility]  Office  [phone]  CC: Primary care physician; [name], MD

## 2016-09-13 ENCOUNTER — Inpatient Hospital Stay: Payer: Medicare Other

## 2016-09-13 ENCOUNTER — Other Ambulatory Visit: Payer: Medicare Other

## 2016-09-13 DIAGNOSIS — N1339 Other hydronephrosis: Secondary | ICD-10-CM

## 2016-09-13 DIAGNOSIS — A408 Other streptococcal sepsis: Secondary | ICD-10-CM

## 2016-09-13 LAB — BASIC METABOLIC PANEL
ANION GAP: 7 (ref 5–15)
BUN: 66 mg/dL — ABNORMAL HIGH (ref 6–20)
CHLORIDE: 109 mmol/L (ref 101–111)
CO2: 21 mmol/L — ABNORMAL LOW (ref 22–32)
Calcium: 8.4 mg/dL — ABNORMAL LOW (ref 8.9–10.3)
Creatinine, Ser: 2.17 mg/dL — ABNORMAL HIGH (ref 0.61–1.24)
GFR calc Af Amer: 33 mL/min — ABNORMAL LOW (ref >60)
GFR, EST NON AFRICAN AMERICAN: 28 mL/min — AB (ref >60)
Glucose, Bld: 140 mg/dL — ABNORMAL HIGH (ref 65–99)
POTASSIUM: 4.8 mmol/L (ref 3.5–5.1)
SODIUM: 137 mmol/L (ref 135–145)

## 2016-09-13 LAB — CBC
HEMATOCRIT: 32.1 % — AB (ref 40.0–52.0)
HEMOGLOBIN: 9.5 g/dL — AB (ref 13.0–18.0)
MCH: 20.2 pg — ABNORMAL LOW (ref 26.0–34.0)
MCHC: 29.8 g/dL — ABNORMAL LOW (ref 32.0–36.0)
MCV: 68 fL — ABNORMAL LOW (ref 80.0–100.0)
Platelets: 235 10*3/uL (ref 150–440)
RBC: 4.72 MIL/uL (ref 4.40–5.90)
RDW: 21.6 % — ABNORMAL HIGH (ref 11.5–14.5)
WBC: 16.5 10*3/uL — AB (ref 3.8–10.6)

## 2016-09-13 LAB — VANCOMYCIN, TROUGH: VANCOMYCIN TR: 25 ug/mL — AB (ref 15–20)

## 2016-09-13 MED ORDER — FENTANYL CITRATE (PF) 100 MCG/2ML IJ SOLN
INTRAMUSCULAR | Status: AC
  Filled 2016-09-13: qty 4

## 2016-09-13 MED ORDER — MIDAZOLAM HCL 5 MG/5ML IJ SOLN
INTRAMUSCULAR | Status: AC
  Filled 2016-09-13: qty 5

## 2016-09-13 MED ORDER — VANCOMYCIN HCL IN DEXTROSE 750-5 MG/150ML-% IV SOLN
750.0000 mg | INTRAVENOUS | Status: DC
  Administered 2016-09-13 – 2016-09-14 (×2): 750 mg via INTRAVENOUS
  Filled 2016-09-13 (×3): qty 150

## 2016-09-13 MED ORDER — SODIUM CHLORIDE 0.9 % IV SOLN
INTRAVENOUS | Status: DC
  Administered 2016-09-13: 09:00:00 via INTRAVENOUS

## 2016-09-13 NOTE — Progress Notes (Signed)
  Patient ID: Collin [name], male   DOB: Nov 08, 1940, 76 y.o.   MRN: [id number]  HISTORY: Instilled TPA yesterday into right PleurX catheter without any results upon trying to drain.  He states he is still somewhat short of breath.  Very disappointed with his health issues and is frustrated with his many medical problems.  Wondering if it is "all worht it".     Vitals:   09/13/16 0912 09/13/16 1158  BP: 127/62 122/61  Pulse: 85 79  Resp: 18   Temp: 97.7 F (36.5 C)      EXAM:    Resp: Lungs are coarse bilaterally.  No respiratory distress, normal effort. Heart:  Regular without murmurs Neurological: Alert and oriented to person, place, and time. Coordination normal.  Skin: Skin is warm and dry. No rash noted. No diaphoretic. No erythema. No pallor.  Psychiatric: Normal mood and affect. Normal behavior. Judgment and thought content normal.    ASSESSMENT: The right PleurX drain is nonfunctional and I reviewed with him the option of removal.  In the meantime, he would like to talk to his wife.  Also, I think that an ultrasound guided thoracentesis on the right may be of benefit to him.   PLAN:   Right sided thoracentesis Remove PleurX if patient and his wife consent.  Could do Thursday of this week but he will need to be NPO after midnight tonight if we do this tomorrow and he will need to off all anticoagulants for 24 hours.    Nestor Lewandowsky, MD

## 2016-09-13 NOTE — Progress Notes (Signed)
Urology follow up   09/13/16  Subjective:  1 - Nephrolithiasis - large volume Rt > Lt nephrolithiasis, history of retained / encrusted/ obstructed indwelling double-J right nephrostomy tube, managed with percutaneous nephrostomy tube. Last exchanged in 06/2016, due for exchange.  Previously was considering staged ureteroscopy to attempt to dislodge retained stent, however, failed to present on multiple occasions for preop PICC line/IV antibiotics, significantly noncomplaint.    2 - Acute on Chronic Renal Failure - baseline Cr 1.5-1.75 x years. Acute rise to 2.23 durting this admission. PVR 65mL (emptying bladder completely), reciving lasix + vanc.  3- Left hydropyonephrosis- hydronephrosis on left side down to bladder, previously felt to be related to incomplete bladder emptying but has been voiding well. No clear obstruction identified on CT scan.  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 Presently not bothored by obstructive voiding complaints. PVR 09/12/16 "46mL" (normal).    5 - Sepsis of Unknown Origin - lactic acidosis and ? Sepsis on admit. BCX negative. UCX non-clonal.    Objective: Vital signs in last 24 hours: Temp:  [97.6 F (36.4 C)-97.8 F (36.6 C)] 97.7 F (36.5 C) (01/03 0912) Pulse Rate:  [78-85] 79 (01/03 1158) Resp:  [18] 18 (01/03 0912) BP: (122-142)/(57-74) 122/61 (01/03 1158) SpO2:  [96 %-100 %] 97 % (01/03 1158) Weight:  [189 lb 12.8 oz (86.1 kg)] 189 lb 12.8 oz (86.1 kg) (01/03 0317) Last BM Date: 09/13/16  Intake/Output from previous day: 01/02 0701 - 01/03 0700 In: -  Out: [id number] [Urine:1650] Intake/Output this shift: No intake/output data recorded.  General appearance: alert, cooperative, appears stated age Eyes: negative Nose: Nares normal. Septum midline. Mucosa normal. No drainage or sinus tenderness. Throat: lips, mucosa, and tongue normal; teeth and gums normal Neck: supple, symmetrical, trachea midline Resp: shallow,  mild increase WOB in [place] O2.  GI: soft, non-tender; bowel sounds normal; no masses,  no organomegaly and large midline scar w/o hernias.  Male genitalia: normal, bladder non-palpable. Copious clear urine from Rt nephroureteral tube. clear yellow urine in bedside urinal.  Neurologic: Grossly normal  Lab Results:   Recent Labs  09/12/16 0230 09/13/16 0436  WBC 15.1* 16.5*  HGB 10.2* 9.5*  HCT 34.1* 32.1*  PLT 265 235   BMET  Recent Labs  09/12/16 0230  09/12/16 1549 09/13/16 0436  NA 134*  --   --  137  K 5.4*  < > 5.1 4.8  CL 105  --   --  109  CO2 22  --   --  21*  GLUCOSE 133*  --   --  140*  BUN 70*  --   --  66*  CREATININE 2.23*  --   --  2.17*  CALCIUM 8.6*  --   --  8.4*  < > = values in this interval not displayed. PT/INR No results for input(s): LABPROT, INR in the last 72 hours. ABG No results for input(s): PHART, HCO3 in the last 72 hours.  Invalid input(s): PCO2, PO2  Studies/Results: No results found.  Anti-infectives: Anti-infectives    Start     Dose/Rate Route Frequency Ordered Stop   09/13/16 1600  vancomycin (VANCOCIN) IVPB 750 mg/150 ml premix     750 mg 150 mL/hr over 60 Minutes Intravenous Every 24 hours 09/13/16 0529     09/10/16 0300  vancomycin (VANCOCIN) 1,250 mg in sodium chloride 0.9 % 250 mL IVPB  Status:  Discontinued     1,250 mg 166.7 mL/hr over 90 Minutes  Intravenous Every 24 hours 09/09/16 2311 09/12/16 1143   09/10/16 0200  meropenem (MERREM) 1 g in sodium chloride 0.9 % 100 mL IVPB  Status:  Discontinued     1 g 200 mL/hr over 30 Minutes Intravenous Every 12 hours 09/09/16 2311 09/09/16 2347   09/10/16 0200  meropenem (MERREM) IVPB SOLR 1 g     1 g 100 mL/hr over 30 Minutes Intravenous Every 12 hours 09/09/16 2349     09/09/16 1915  vancomycin (VANCOCIN) IVPB 1000 mg/200 mL premix     1,000 mg 200 mL/hr over 60 Minutes Intravenous  Once 09/09/16 1914 09/09/16 2146   09/09/16 1915  ceFEPIme (MAXIPIME) 1 GM / 85mL IVPB premix      1 g 100 mL/hr over 30 Minutes Intravenous STAT 09/09/16 1914 09/09/16 2027      Assessment/Plan:  1 - Nephrolithiasis/ retained right ureteral stent -not currently operative candidate, this seems unlikely given his CV comorbidity.   2 - Acute on Chronic Renal Failure/ new left hydronephrosis- present Cr rise most likely pre-renal + vanco induced.  Improving but not back to baseline ~1.6.  CT findings were reviewed with Dr. Yehuda Mao. I recommended consideration of a left-sided percutaneous nephrostomy tube in addition to his right-sided tube. Diagnostic imaging in the form of antegrade nephrostogram could help define the underlying etiology.  Both Mr. Edmonds and Dr. Yehuda Mao are agreeable to proceed with left percutaneous nephrostomy tube in addition to right-sided exchange today. Order placed. Patient advised to remain nothing by mouth until the procedure.  3 - Urethral Stricture - asymptomatic. No indication for putting him through aggressive dilation procedures as he empties well.   4 - Sepsis of Unknown Origin - low suspicion for urinary soursce as UCX non-clonal.    Hollice Espy 09/13/2016

## 2016-09-13 NOTE — Progress Notes (Signed)
Pharmacy Antibiotic Note  Collin [name] is a 76 y.o. male admitted on 09/09/2016 with sepsis Pharmacy has been consulted for vancomycin and meropenem dosing.  This is day #4 of IV antibiotics.   Plan: 1/3 0436 vancomycin trough 25 mcg/mL. Recalculated Ke 0.027 hr-1, T1/2 25.4 hr. Decrease dose to vancomycin 750 mg IV Q24H starting at 1600, predicted trough 15 mcg/mL. Pharmacy will continue to follow and adjust as needed to maintain trough 15 to 20 mcg/mL.  Continue meropenem 1 gram IV q12h  Height: 5\' 9"  (175.3 cm) Weight: 189 lb 12.8 oz (86.1 kg) IBW/kg (Calculated) : 70.7  Temp (24hrs), Avg:97.7 F (36.5 C), Min:97.6 F (36.4 C), Max:97.8 F (36.6 C)   Recent Labs Lab 09/09/16 1817 09/09/16 2143 09/10/16 0139 09/10/16 0604 09/11/16 0515 09/12/16 0230 09/13/16 0436  WBC 12.8*  --   --  8.3 18.0* 15.1* 16.5*  CREATININE 1.74*  --   --  1.63* 2.04* 2.23* 2.17*  LATICACIDVEN 2.5* 2.5* 2.3* 1.5  --   --   --   VANCOTROUGH  --   --   --   --   --   --  25*    Estimated Creatinine Clearance: 33 mL/min (by C-G formula based on SCr of 2.17 mg/dL (H)).    Allergies  Allergen Reactions  . Lisinopril Anaphylaxis  . Metoprolol Other (See Comments)    Reaction:  Ringing in ears   . Gabapentin Rash    Antimicrobials this admission: vancomycin 12/30 >>  meropenem 12/31 >>  Cefepime x1 in ED  Dose adjustments this admission:  Microbiology results: 12/30 BCx: No growth 3 days 12/30 UCx: multiple species, suggest recollection  Thank you for allowing pharmacy to be a part of this patient's care.  Laural Benes, PharmD, BCPS Clinical Pharmacist 09/13/2016 5:31 AM

## 2016-09-13 NOTE — Progress Notes (Signed)
Patient refused for his procedure to be done today, DR Gouru made aware . Stated he will think about it and consult with his wife before he sign the consent for the procedure .

## 2016-09-13 NOTE — Progress Notes (Signed)
PT Cancellation Note  Patient Details Name: Collin [name] MRN: [id number] DOB: 07-25-1941   Cancelled Treatment:    Reason Eval/Treat Not Completed: Other (comment) (" Not now!  I can't do it").  Will try later as time and pt allow.   Ramond Dial 09/13/2016, 11:40 AM   Mee Hives, PT MS Acute Rehab Dept. Number: [place] and [place]

## 2016-09-13 NOTE — Progress Notes (Signed)
[facility] [place] NAME: Collin [name]    MR#:  [id number]  DATE OF BIRTH:  [birth year]  SUBJECTIVE:  CHIEF COMPLAINT:  Patient it's very upset as he needs to be nothing by mouth for the procedure Wants to eat. Wife at bedside, convinced him not to eat dental procedures  REVIEW OF SYSTEMS:  CONSTITUTIONAL: No fever, fatigue or weakness.  EYES: No blurred or double vision.  EARS, NOSE, AND THROAT: No tinnitus or ear pain.  RESPIRATORY: reports cough, exertional shortness of breath, no wheezing or hemoptysis.  Right-sided Pleurx catheter CARDIOVASCULAR: No chest pain, orthopnea, edema.  GASTROINTESTINAL: No nausea, vomiting, diarrhea or abdominal pain.  GENITOURINARY: No dysuria, hematuria. Left-sided nephrostomy tube ENDOCRINE: No polyuria, nocturia,  HEMATOLOGY: No anemia, easy bruising or bleeding SKIN: No rash or lesion. MUSCULOSKELETAL: No joint pain or arthritis.   NEUROLOGIC: No tingling, numbness, weakness.  PSYCHIATRY: No anxiety or depression.   DRUG ALLERGIES:   Allergies  Allergen Reactions  . Lisinopril Anaphylaxis  . Metoprolol Other (See Comments)    Reaction:  Ringing in ears   . Gabapentin Rash    VITALS:  Blood pressure 122/61, pulse 79, temperature 97.7 F (36.5 C), temperature source Oral, resp. rate 18, height 5\' 9"  (1.753 m), weight 86.1 kg (189 lb 12.8 oz), SpO2 97 %.  PHYSICAL EXAMINATION:  GENERAL:  76 y.o.-year-old patient lying in the bed with no acute distress.  EYES: Pupils equal, round, reactive to light and accommodation. No scleral icterus. Extraocular muscles intact.  HEENT: Head atraumatic, normocephalic. Oropharynx and nasopharynx clear.  NECK:  Supple, no jugular venous distention. No thyroid enlargement, no tenderness.  LUNGS: Moderate breath sounds bilaterally, diminished on right side. Intact Pleurx catheter no wheezing, rales,rhonchi or crepitation. No use of accessory muscles of  respiration.  CARDIOVASCULAR: S1, S2 normal. No murmurs, rubs, or gallops.  ABDOMEN: Soft, nontender, nondistended. Bowel sounds present. No organomegaly or mass.  EXTREMITIES:Left AKA No pedal edema, cyanosis, or clubbing. Has left-sided nephrostomy tube NEUROLOGIC: Cranial nerves II through XII are intact. Muscle strength 5/5 in all extremities. Sensation intact. Gait not checked.  PSYCHIATRIC: The patient is alert and oriented x 3.  SKIN: No obvious rash, lesion, or ulcer.    LABORATORY PANEL:   CBC  Recent Labs Lab 09/13/16 0436  WBC 16.5*  HGB 9.5*  HCT 32.1*  PLT 235   ------------------------------------------------------------------------------------------------------------------  Chemistries   Recent Labs Lab 09/13/16 0436  NA 137  K 4.8  CL 109  CO2 21*  GLUCOSE 140*  BUN 66*  CREATININE 2.17*  CALCIUM 8.4*   ------------------------------------------------------------------------------------------------------------------  Cardiac Enzymes  Recent Labs Lab 09/10/16 1212  TROPONINI 0.03*   ------------------------------------------------------------------------------------------------------------------  RADIOLOGY:  No results found.  EKG:   Orders placed or performed during the hospital encounter of 09/09/16  . EKG 12-Lead  . EKG 12-Lead  . EKG 12-Lead  . EKG 12-Lead    ASSESSMENT AND PLAN:     * Sepsis ([place]) - suspected source is infection in his effusion. 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 Discontinue IV fluids .     *Recurrent right pleural effusion -  CT chest has revealed loculated right-sided pleural effusion  -  Appreciate  pulmonology recommendations  -Consult placed to thoracic surgery Dr. Faith Rogue, patient has right-sided Pleurx catheter placed one year ago ,  TPA into Pleurex cath on 1/2 2018 with no success -dr. Faith Rogue has recommended ultrasound-guided right-sided thoracocentesis today, patient went to radiology Department and refuses the procedure -Nothing by mouth after midnight for possible Pleurx catheter removal tomorrow, if patient is agreeable   *Left-sided hydronephrosis and nephrolithiasis, benign prostatic hypertrophy, meatus stenosis Patient is urinating,But incomplete emptying  Flomax started  No need of Foley catheter placement per urology Patient refused nephrostomy tubes placement today Patient is deferring meatotomy and urethroplasty for meatal stenosis  *Hyperkalemia with renal failure Kayexalate 15 g given yesterday and today and discontinue spironolactone for now  check BMP in a.m. 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  The patient has aneurysmal degeneration of the proximal anastomosis of his open aneurysm repair performed in [place] in 2006.  Maximum diameter is 4.7.   Vascular is recommending follow-up ultrasound as an outpatient in 6 months.  *Abnormal troponin-0.04-0.69-0.03-0.03 Troponin is elevated only once which could be a lab error. Patient is asymptomatic. Heparin  drip discontinued Cardiac consult placed , discussed with Dr. Ubaldo Glassing he is agreeable   * COPD (chronic obstructive pulmonary disease) ([place]) -  no  exacerbation continue home inhalers and medications, when necessary duo nebs  * Chronic systolic CHF (congestive heart failure) ([place]) - continue home meds, repeat echocardiogram 10-15% ejection fraction     follow-up with urology, pulmonology, cardiology, thoracic surgery, vascular surgery  Patient is very noncompliant refusing medications and procedures,the consequences of refusing refusing the procedures were discussed with the patient he is aware and states he doesn't care  Poor prognosis  All the records are reviewed and case discussed with Care Management/Social Workerr. Management plans discussed with the patient, wife and they are in agreement.  CODE STATUS: FC   TOTAL TIME TAKING CARE OF THIS PATIENT: 36  minutes.   POSSIBLE D/C IN 2-3  DAYS, DEPENDING ON CLINICAL CONDITION.  Note: This dictation was prepared with Dragon dictation along with smaller phrase technology. Any transcriptional errors that result from this process are unintentional.   Nicholes Mango M.D on 09/13/2016 at 4:34 PM  Between 7am to 6pm - Pager - [phone] After 6pm go to [email or web address] - password [email or web address] [facility]  Office  [phone]  CC: Primary care physician; [name], MD

## 2016-09-14 ENCOUNTER — Inpatient Hospital Stay: Payer: Medicare Other

## 2016-09-14 DIAGNOSIS — N1330 Unspecified hydronephrosis: Secondary | ICD-10-CM

## 2016-09-14 LAB — CULTURE, BLOOD (ROUTINE X 2)
CULTURE: NO GROWTH
Culture: NO GROWTH

## 2016-09-14 LAB — BASIC METABOLIC PANEL
Anion gap: 6 (ref 5–15)
BUN: 65 mg/dL — ABNORMAL HIGH (ref 6–20)
CALCIUM: 8.1 mg/dL — AB (ref 8.9–10.3)
CHLORIDE: 108 mmol/L (ref 101–111)
CO2: 22 mmol/L (ref 22–32)
Creatinine, Ser: 2 mg/dL — ABNORMAL HIGH (ref 0.61–1.24)
GFR calc non Af Amer: 31 mL/min — ABNORMAL LOW (ref >60)
GFR, EST AFRICAN AMERICAN: 36 mL/min — AB (ref >60)
Glucose, Bld: 125 mg/dL — ABNORMAL HIGH (ref 65–99)
Potassium: 5.1 mmol/L (ref 3.5–5.1)
Sodium: 136 mmol/L (ref 135–145)

## 2016-09-14 NOTE — Progress Notes (Signed)
Patient made aware of NPO status at [facility] on 1/4. Patient refused to be NPO and stated he would be "eating breakfast in the morning."

## 2016-09-14 NOTE — Care Management (Signed)
Patient taken downstairs for thoracentesis and CM informed patient refused the procedure.  Said he and his wife needed time to pray about things.

## 2016-09-14 NOTE — Care Management (Signed)
Patient declined nephrostomy and pleurx tube replacements 1.3.18 after being taken to specials for the procedures.  he is very verbal regarding his desire NOT to be Npo for the procedures. Procedures rescheduled for today.  Patient is again verbalizing that he is going to eat breakfast this morning.

## 2016-09-14 NOTE — Progress Notes (Signed)
  Patient ID: Collin [name], male   DOB: [birth year], 76 y.o.   MRN: [id number]  HISTORY: He continues to be frustrated with all of his multiple medical problems. He was scheduled to have a an ultrasound-guided thoracentesis but he declined that. Otherwise he remains weak and somewhat short of breath.   Vitals:   09/14/16 1010 09/14/16 1210  BP: 122/76 115/64  Pulse: 81 64  Resp: 20 (!) 22  Temp: 97.5 F (36.4 C) 97.6 F (36.4 C)     EXAM:    Resp: Lungs show diminished breath sounds bilaterally..  No respiratory distress, poor effort. Heart:  Regular without murmurs Abd:  Abdomen is soft, non distended and non tender. No masses are palpable.  There is no rebound and no guarding.  Neurological: Alert and oriented to person, place, and time. Coordination normal.  Skin: Skin is warm and dry. No rash noted. No diaphoretic. No erythema. No pallor.  left AKA stump looks well.  Psychiatric: Normal mood and affect. Normal behavior. Judgment and thought content normal.    ASSESSMENT: I again reviewed with the patient and his wife the indications and risks of Pleurx catheter removal. I do not believe that the catheter will serve him any purpose since it does not communicate with the loculated right-sided pleural effusion.   PLAN:   My recommendation be to remove the catheter at his convenience. This can be performed in the operating room under local anesthesia. I would also recommend a percutaneous drainage of his right pleural effusion. This will suffice and allow him to be less short of breath. I will continue to follow with you.    Nestor Lewandowsky, MD

## 2016-09-14 NOTE — Care Management (Signed)
Dr Erlene Quan and CM spoke with patient and his wife.  Patient says he did not refuse treatments yesterday.  Says "my head is all messed up and I am nauseasted and hungry.  I will have the procedures tomorrow.  Discussed medical necessity for continued stay and if he continued to refuse the procedures, he will  be discharged home.  Had staff bring his something to drink and graham crackers during the night so he is not NPO for procedures this morning.  Dr Erlene Quan to order ultrasound of left kidney and will assess whether patient could have the exchange of his right nephrostomy tube today- if not- 1.5..Asked wife to bring prosthesis and verbalized she would do so- even though patient is saying "no."  It has been verbally reported that patient has agreed to pleurx chest tube exchange today.  Dr Erlene Quan informed CM that she has made attempts to arrange for outpatient iv antibiotic therapy but patient has been noncompliant with the process.  Discussed home IV therapy may not be a good treatment option for this patient due to  compliance issues.

## 2016-09-14 NOTE — Progress Notes (Addendum)
Urology follow up   09/14/16  Subjective:  1 - Nephrolithiasis - large volume Rt > Lt nephrolithiasis, history of retained / encrusted/ obstructed indwelling double-J right nephrostomy tube, managed with percutaneous nephrostomy tube. Last exchanged in 06/2016, due for exchange.  Previously was considering staged ureteroscopy to attempt to dislodge retained stent, however, failed to present on multiple occasions for preop PICC line/IV antibiotics, significantly noncomplaint.    2 - Acute on Chronic Renal Failure - baseline Cr 1.5-1.75 x years. Acute rise to 2.23 durting this admission. PVR 29mL (emptying bladder completely), reciving lasix + vanc.  3- Left hydropyonephrosis- hydronephrosis on left side down to bladder, previously felt to be related to incomplete bladder emptying but has been voiding well. No clear obstruction identified on CT scan.  Taken to IR yesterday 09/13/16, refused left percutaneous nephrostomy tube after being NPO all day.    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 Presently not bothored by obstructive voiding complaints. PVR 09/12/16 "71mL" (normal).    5 - Sepsis of Unknown Origin - lactic acidosis and ? Sepsis on admit. BCX negative. UCX non-clonal.    Objective: Vital signs in last 24 hours: Temp:  [97.8 F (36.6 C)-98.1 F (36.7 C)] 97.8 F (36.6 C) (01/04 0417) Pulse Rate:  [79-80] 80 (01/04 0417) Resp:  [18] 18 (01/04 0417) BP: (114-122)/(56-65) 121/65 (01/04 0417) SpO2:  [97 %-98 %] 98 % (01/04 0417) Weight:  [192 lb 11.2 oz (87.4 kg)] 192 lb 11.2 oz (87.4 kg) (01/04 0417) Last BM Date: 09/13/16  Intake/Output from previous day: 01/03 0701 - 01/04 0700 In: 505.5 [I.V.:55.5; IV Piggyback:450] Out: 700 [Urine:700] Intake/Output this shift: No intake/output data recorded.  General appearance: alert, cooperative, appears stated age.  Wife at bedside this AM. Eyes: negative Nose: Nares normal. Septum midline. Mucosa normal. No  drainage or sinus tenderness. Throat: lips, mucosa, and tongue normal; teeth and gums normal Neck: supple, symmetrical, trachea midline Resp: shallow, mild increase WOB in [place] O2.  GI: soft, non-tender; bowel sounds normal; no masses,  no organomegaly and large midline scar w/o hernias.  Male genitalia: Copious clear urine from Rta nephroureteral tube. clear yellow urine in bedside urinal.  Neurologic: Grossly normal  Lab Results:   Recent Labs  09/12/16 0230 09/13/16 0436  WBC 15.1* 16.5*  HGB 10.2* 9.5*  HCT 34.1* 32.1*  PLT 265 235   BMET  Recent Labs  09/13/16 0436 09/14/16 0540  NA 137 136  K 4.8 5.1  CL 109 108  CO2 21* 22  GLUCOSE 140* 125*  BUN 66* 65*  CREATININE 2.17* 2.00*  CALCIUM 8.4* 8.1*   PT/INR No results for input(s): LABPROT, INR in the last 72 hours. ABG No results for input(s): PHART, HCO3 in the last 72 hours.  Invalid input(s): PCO2, PO2  Studies/Results: No results found.  Anti-infectives: Anti-infectives    Start     Dose/Rate Route Frequency Ordered Stop   09/13/16 1600  vancomycin (VANCOCIN) IVPB 750 mg/150 ml premix     750 mg 150 mL/hr over 60 Minutes Intravenous Every 24 hours 09/13/16 0529     09/10/16 0300  vancomycin (VANCOCIN) 1,250 mg in sodium chloride 0.9 % 250 mL IVPB  Status:  Discontinued     1,250 mg 166.7 mL/hr over 90 Minutes Intravenous Every 24 hours 09/09/16 2311 09/12/16 1143   09/10/16 0200  meropenem (MERREM) 1 g in sodium chloride 0.9 % 100 mL IVPB  Status:  Discontinued     1  g 200 mL/hr over 30 Minutes Intravenous Every 12 hours 09/09/16 2311 09/09/16 2347   09/10/16 0200  meropenem (MERREM) IVPB SOLR 1 g     1 g 100 mL/hr over 30 Minutes Intravenous Every 12 hours 09/09/16 2349     09/09/16 1915  vancomycin (VANCOCIN) IVPB 1000 mg/200 mL premix     1,000 mg 200 mL/hr over 60 Minutes Intravenous  Once 09/09/16 1914 09/09/16 2146   09/09/16 1915  ceFEPIme (MAXIPIME) 1 GM / 3mL IVPB premix     1 g 100  mL/hr over 30 Minutes Intravenous STAT 09/09/16 1914 09/09/16 2027      Assessment/Plan:  1 - Nephrolithiasis/ retained right ureteral stent -not currently operative candidate, this seems unlikely given his CV comorbidity.   2 - Acute on Chronic Renal Failure/ new left hydronephrosis- present Cr rise most likely pre-renal + vanco induced.  Improving but not back to baseline ~1.6.  CT findings were reviewed with Dr. Yehuda Mao. I recommended consideration of a left-sided percutaneous nephrostomy tube in addition to his right-sided tube. Diagnostic imaging in the form of antegrade nephrostogram could help define the underlying etiology.  Refused procedure yesterday.  He has already eaten today despite NPO order.  Agreed for renal ultrasound today and if left hydronephrosis persists, then agrees to left PCN and nephrostomy tube exchange only if first procedure of the morning, patient advised not able to guarantee this.    3 - Urethral Stricture - asymptomatic. No indication for putting him through aggressive dilation procedures as he empties well.   4 - Sepsis of Unknown Origin - low suspicion for urinary soursce as UCX non-clonal.    Hollice Espy 09/14/2016   Addendum: Discussed patient with Aletta Edouard this morning, persistent hydronephrosis on ultrasound They've agreed to put the patient on for early morning tomorrow morning for left nephrostomy, left antegrade, and right nephrostomy tube exchange. We will make him nothing by mouth at midnight. Pilar Plate discussion today with the patient about importance of diet, has agreed to proceed with the procedure tomorrow.

## 2016-09-14 NOTE — Progress Notes (Signed)
Collin [name] at [place] NAME: Collin [name]    MR#:  [id number]  DATE OF BIRTH:  [birth year]  SUBJECTIVE:  CHIEF COMPLAINT:  Patient Had a renal ultrasound and refused his procedure today and wants to get it tomorrow. Wife is not at bedside  REVIEW OF SYSTEMS:  CONSTITUTIONAL: No fever, fatigue or weakness.  EYES: No blurred or double vision.  EARS, NOSE, AND THROAT: No tinnitus or ear pain.  RESPIRATORY: reports cough, exertional shortness of breath, no wheezing or hemoptysis.  Right-sided Pleurx catheter CARDIOVASCULAR: No chest pain, orthopnea, edema.  GASTROINTESTINAL: No nausea, vomiting, diarrhea or abdominal pain.  GENITOURINARY: No dysuria, hematuria. Left-sided nephrostomy tube ENDOCRINE: No polyuria, nocturia,  HEMATOLOGY: No anemia, easy bruising or bleeding SKIN: No rash or lesion. MUSCULOSKELETAL: No joint pain or arthritis.   NEUROLOGIC: No tingling, numbness, weakness.  PSYCHIATRY: No anxiety or depression.   DRUG ALLERGIES:   Allergies  Allergen Reactions  . Lisinopril Anaphylaxis  . Metoprolol Other (See Comments)    Reaction:  Ringing in ears   . Gabapentin Rash    VITALS:  Blood pressure 115/64, pulse 64, temperature 97.6 F (36.4 C), temperature source Oral, resp. rate (!) 22, height 5\' 9"  (1.753 m), weight 87.4 kg (192 lb 11.2 oz), SpO2 100 %.  PHYSICAL EXAMINATION:  GENERAL:  76 y.o.-year-old patient lying in the bed with no acute distress.  EYES: Pupils equal, round, reactive to light and accommodation. No scleral icterus. Extraocular muscles intact.  HEENT: Head atraumatic, normocephalic. Oropharynx and nasopharynx clear.  NECK:  Supple, no jugular venous distention. No thyroid enlargement, no tenderness.  LUNGS: Moderate breath sounds bilaterally, diminished on right side. Intact Pleurx catheter no wheezing, rales,rhonchi or crepitation. No use of accessory muscles of respiration.  CARDIOVASCULAR: S1, S2  normal. No murmurs, rubs, or gallops.  ABDOMEN: Soft, nontender, nondistended. Bowel sounds present. No organomegaly or mass.  EXTREMITIES:Left AKA No pedal edema, cyanosis, or clubbing. Has left-sided nephrostomy tube NEUROLOGIC: Cranial nerves II through XII are intact. Muscle strength 5/5 in all extremities. Sensation intact. Gait not checked.  PSYCHIATRIC: The patient is alert and oriented x 3.  SKIN: No obvious rash, lesion, or ulcer.    LABORATORY PANEL:   CBC  Recent Labs Lab 09/13/16 0436  WBC 16.5*  HGB 9.5*  HCT 32.1*  PLT 235   ------------------------------------------------------------------------------------------------------------------  Chemistries   Recent Labs Lab 09/14/16 0540  NA 136  K 5.1  CL 108  CO2 22  GLUCOSE 125*  BUN 65*  CREATININE 2.00*  CALCIUM 8.1*   ------------------------------------------------------------------------------------------------------------------  Cardiac Enzymes  Recent Labs Lab 09/10/16 1212  TROPONINI 0.03*   ------------------------------------------------------------------------------------------------------------------  RADIOLOGY:  US Renal  Result Date: 09/14/2016 CLINICAL DATA:  Nephrolithiasis, renal obstruction and renal failure. Status post previous placement of right-sided percutaneous nephrostomy to on 08/25/2015. EXAM: RENAL / URINARY TRACT ULTRASOUND COMPLETE COMPARISON:  CT of the abdomen and pelvis without contrast on 09/10/2016 and renal ultrasound on 08/23/2015 FINDINGS: Right Kidney: Length: 12.5 cm. The renal collecting system it is decompressed by a nephrostomy tube. Nonobstructing calculi are seen, especially in the upper pole. The renal cortex is mildly echogenic, likely reflecting a component of chronic kidney disease. No significant atrophy of the right kidney is identified. Left Kidney: Length: 10.3 cm. Moderate hydronephrosis. Renal parenchyma is thinned and atrophic. Parenchymal loss of the  left kidney does appear to be progressive since the prior renal ultrasound in 2016. Multiple shadowing calculi are seen scattered  throughout the collecting system. Dilated extrarenal pelvis present. Bladder: Mildly distended. Distal portion of a ureteral stent is visualized in the dependent bladder. There may be a mild amount of urinary debris in the bladder. IMPRESSION: 1. No evidence of right hydronephrosis. The right renal cortex is mildly echogenic, likely reflecting a component of chronic kidney disease. Nonobstructing calculi are seen on the right. 2. Moderate left hydronephrosis with thinned and atrophic left renal parenchyma. Parenchymal loss appears to be progressive since a prior renal ultrasound in 2016. Electronically Signed   By: Aletta Edouard M.D.   On: 09/14/2016 13:45    EKG:   Orders placed or performed during the hospital encounter of 09/09/16  . EKG 12-Lead  . EKG 12-Lead  . EKG 12-Lead  . EKG 12-Lead    ASSESSMENT AND PLAN:     * Sepsis ([place]) - suspected source is infection in his effusion. 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 Discontinue IV fluids .     *Recurrent right pleural effusion -  CT chest has revealed loculated right-sided pleural effusion  -Appreciate pulmonology recommendations  -Consult placed to thoracic surgery Dr. Faith Rogue, patient has right-sided Pleurx catheter placed one year ago ,  TPA into Pleurex cath on 1/2 2018 with no success -dr. Faith Rogue has recommended ultrasound-guided right-sided thoracocentesis today,But patient refused to get the procedure , promised to get it done tomorrow -Nothing by mouth after midnight   Pleurx catheter removal as  an outpatient at patient's convenience per dr.Oaks   *Left-sided hydronephrosis and nephrolithiasis, benign prostatic hypertrophy, meatus stenosis Renal ultrasound with moderate left-sided hydronephrosis no right-sided hydronephrosis  Patient is urinating,But incomplete emptying  Flomax started  No need of Foley catheter placement per urology Patient refused nephrostomy tubes placement yesterday and anticipating procedures tomorrow Patient is deferring meatotomy and urethroplasty for meatal stenosis  *Hyperkalemia with renal failure Potassium at 5.1 today Follow up with nephrology. Not considering dialysis at this time as renal function is acceptable Discontinued entresto  *aki with hyperkalemia-post renal, probably component of renal etiology Avoid nephrotoxins,   patient temporarily had hemodialysis in the past Follow-up with nephrology. 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  The patient has aneurysmal degeneration of the proximal anastomosis of his open aneurysm repair performed in [place] in 2006.  Maximum diameter is 4.7.   Vascular is recommending follow-up ultrasound as an outpatient in 6 months.  *Abnormal troponin-0.04-0.69-0.03-0.03 Troponin is elevated only once which could be a lab error. Patient is asymptomatic. Heparin  drip discontinued Cardiac consult placed , discussed with Dr. Ubaldo Glassing he is agreeable   * COPD (chronic obstructive pulmonary disease) ([place]) -  no exacerbation continue home inhalers and medications, when necessary duo nebs  * Chronic systolic CHF (congestive heart failure) ([place]) - continue home meds, repeat echocardiogram 10-15% ejection fraction     follow-up with urology, nephrology  pulmonology, cardiology, thoracic  surgery, vascular surgery  Patient is very noncompliant refusing medications and procedures,the consequences of refusing refusing the procedures were discussed with the patient he  is aware and states he doesn't care  Poor prognosis  All the records are reviewed and case discussed with Care Management/Social Workerr. Management plans discussed with the patient, wife and they are in agreement.  CODE STATUS: FC   TOTAL TIME TAKING CARE OF THIS PATIENT: 36  minutes.   POSSIBLE D/C IN 2-3  DAYS, DEPENDING ON CLINICAL CONDITION.  Note: This dictation was prepared with Dragon dictation along with smaller phrase technology. Any transcriptional errors that result from this process are unintentional.   Nicholes Mango M.D on 09/14/2016 at 4:21 PM  Between 7am to 6pm - Pager - [phone] After 6pm go to [email or web address] - password [email or web address] [facility]  Office  [phone]  CC: Primary care physician; [name], MD

## 2016-09-14 NOTE — Progress Notes (Signed)
  PT Cancellation Note  Patient Details Name: Collin [name] MRN: [id number] DOB: [birth year]   Cancelled Treatment:    Reason Eval/Treat Not Completed: Patient declined, no reason specified.  Pt adamantly refusing PT and stating to PT "How can you help?  I have one leg".  Pt also reporting that he was too weak to do anything at this time.  PT attempted to educated pt on role of PT and how PT can help but pt stating "I can help myself with that".  PT stated that they would come back at another time to try to work with pt and pt stated "don't bother".  Raquel Sarna Zuriah Bordas 09/14/2016, 9:24 AM Leitha Bleak, [place]

## 2016-09-14 NOTE — Progress Notes (Signed)
Subjective:   Patient known to our practice from outpatient. He was last seen in December 2016. He has a history of right nephrostomy tube He has chronic kidney disease with baseline close to 1.63 noted on December 31/GFR 40 He presents for acute on chronic renal failure. He is noted to have severe left hydronephrosis. Patient has refused percutaneous nephrostomy Today's serum creatinine is 2.0 He does not have any acute shortness of breath   Objective:  Vital signs in last 24 hours:  Temp:  [97.5 F (36.4 C)-98.1 F (36.7 C)] 97.6 F (36.4 C) (01/04 1210) Pulse Rate:  [64-81] 64 (01/04 1210) Resp:  [18-22] 22 (01/04 1210) BP: (114-122)/(56-76) 115/64 (01/04 1210) SpO2:  [97 %-100 %] 100 % (01/04 1210) Weight:  [87.4 kg (192 lb 11.2 oz)] 87.4 kg (192 lb 11.2 oz) (01/04 0417)  Weight change: 1.315 kg (2 lb 14.4 oz) Filed Weights   09/12/16 0500 09/13/16 0317 09/14/16 0417  Weight: 86.8 kg (191 lb 6.4 oz) 86.1 kg (189 lb 12.8 oz) 87.4 kg (192 lb 11.2 oz)    Intake/Output:    Intake/Output Summary (Last 24 hours) at 09/14/16 1316 Last data filed at 09/14/16 0417  Gross per 24 hour  Intake            505.5 ml  Output              700 ml  Net           -194.5 ml     Physical Exam: General: Chronically ill-appearing   HEENT Moist oral mucous membranes   Neck Supple   Pulm/lungs Coarse crackles bilaterally   CVS/Heart Irregular   Abdomen:  Soft, nontender   Extremities: Some dependent edema   Neurologic: Alert, oriented   Skin: No acute rashes           Basic Metabolic Panel:   Recent Labs Lab 09/10/16 0604 09/11/16 0515 09/12/16 0230 09/12/16 0929 09/12/16 1304 09/12/16 1549 09/13/16 0436 09/14/16 0540  NA 136 137 134*  --   --   --  137 136  K 4.8 5.2* 5.4* 5.1 5.3* 5.1 4.8 5.1  CL 110 109 105  --   --   --  109 108  CO2 19* 21* 22  --   --   --  21* 22  GLUCOSE 236* 172* 133*  --   --   --  140* 125*  BUN 49* 56* 70*  --   --   --  66* 65*   CREATININE 1.63* 2.04* 2.23*  --   --   --  2.17* 2.00*  CALCIUM 8.8* 8.5* 8.6*  --   --   --  8.4* 8.1*     CBC:  Recent Labs Lab 09/09/16 1817 09/10/16 0604 09/11/16 0515 09/12/16 0230 09/13/16 0436  WBC 12.8* 8.3 18.0* 15.1* 16.5*  NEUTROABS  --   --  15.5* 10.5*  --   HGB 11.2* 10.2* 9.9* 10.2* 9.5*  HCT 38.0* 34.3* 32.0* 34.1* 32.1*  MCV 68.9* 68.2* 67.3* 67.8* 68.0*  PLT 355 275 290 265 235      Microbiology:  Recent Results (from the past 720 hour(s))  Culture, blood (Routine X 2) w Reflex to ID Panel     Status: None   Collection Time: 09/09/16  6:21 PM  Result Value Ref Range Status   Specimen Description BLOOD LEFT WRIST  Final   Special Requests   Final    BOTTLES DRAWN AEROBIC AND  ANAEROBIC AERB [place] ANA 14CC   Culture NO GROWTH 5 DAYS  Final   Report Status 09/14/2016 FINAL  Final  Culture, blood (Routine X 2) w Reflex to ID Panel     Status: None   Collection Time: 09/09/16  6:21 PM  Result Value Ref Range Status   Specimen Description BLOOD LEFT ANTECUBITAL  Final   Special Requests   Final    BOTTLES DRAWN AEROBIC AND ANAEROBIC  ANA 12CC AER 13CC   Culture NO GROWTH 5 DAYS  Final   Report Status 09/14/2016 FINAL  Final  Urine culture     Status: Abnormal   Collection Time: 09/09/16  9:18 PM  Result Value Ref Range Status   Specimen Description URINE, RANDOM  Final   Special Requests NONE  Final   Culture MULTIPLE SPECIES PRESENT, SUGGEST RECOLLECTION (A)  Final   Report Status 09/11/2016 FINAL  Final    Coagulation Studies: No results for input(s): LABPROT, INR in the last 72 hours.  Urinalysis: No results for input(s): COLORURINE, LABSPEC, PHURINE, GLUCOSEU, HGBUR, BILIRUBINUR, KETONESUR, PROTEINUR, UROBILINOGEN, NITRITE, LEUKOCYTESUR in the last 72 hours.  Invalid input(s): APPERANCEUR    Imaging: No results found.   Medications:   . sodium chloride Stopped (09/13/16 1844)   . aspirin EC  81 mg Oral Daily  . atorvastatin  80 mg  Oral q1800  . bisoprolol  5 mg Oral Daily  . fluticasone furoate-vilanterol  1 puff Inhalation Daily  . meropenem  1 g Intravenous Q12H  . pantoprazole  40 mg Oral Daily  . sodium chloride flush  3 mL Intravenous Q12H  . sodium polystyrene  15 g Oral Once  . tamsulosin  0.4 mg Oral Daily  . tiotropium  18 mcg Inhalation Daily  . vancomycin  750 mg Intravenous Q24H   acetaminophen **OR** acetaminophen, guaiFENesin-dextromethorphan, HYDROcodone-acetaminophen, ipratropium-albuterol, labetalol, ondansetron **OR** ondansetron (ZOFRAN) IV, tetrahydrozoline  Assessment/ Plan:  76 y.o. male with chronic kidney disease stage III, nephrolithiasis, retained right ureteral stent, percutaneous nephrostomy on the right, new left hydronephrosis, ureteral stricture,  1. Acute renal failure, chronic kidney disease stage III, nephrolithiasis, right nephrostomy Renal failure is likely multifactorial. Serum creatinine has started to improve. His baseline creatinine is 1.6 Patient has refused placement of left nephrostomy which is required for severe left hydronephrosis Electrolytes and volume status are acceptable. No acute indication for dialysis. We will continue to follow closely.   LOS: 5 Takeyah Wieman 1/4/20181:16 PM

## 2016-09-15 ENCOUNTER — Other Ambulatory Visit: Payer: Medicare Other

## 2016-09-15 ENCOUNTER — Inpatient Hospital Stay: Payer: Medicare Other

## 2016-09-15 LAB — BODY FLUID CELL COUNT WITH DIFFERENTIAL
Eos, Fluid: 0 %
LYMPHS FL: 8 %
Monocyte-Macrophage-Serous Fluid: 15 %
NEUTROPHIL FLUID: 77 %
OTHER CELLS FL: 0 %
WBC FLUID: 2288 uL

## 2016-09-15 LAB — BLOOD GAS, VENOUS
Acid-base deficit: 5.4 mmol/L — ABNORMAL HIGH (ref 0.0–2.0)
BICARBONATE: 20 mmol/L (ref 20.0–28.0)
O2 Saturation: 54 %
PATIENT TEMPERATURE: 37
PCO2 VEN: 38 mmHg — AB (ref 44.0–60.0)
PH VEN: 7.33 (ref 7.250–7.430)

## 2016-09-15 LAB — AMYLASE, BODY FLUID: AMYLASE FL: 71 U/L

## 2016-09-15 LAB — LACTATE DEHYDROGENASE, PLEURAL OR PERITONEAL FLUID: LD FL: 76 U/L — AB (ref 3–23)

## 2016-09-15 LAB — GLUCOSE, SEROUS FLUID: Glucose, Fluid: 98 mg/dL

## 2016-09-15 LAB — PROTEIN, BODY FLUID: Total protein, fluid: <3 g/dL

## 2016-09-15 MED ORDER — IPRATROPIUM-ALBUTEROL 0.5-2.5 (3) MG/3ML IN SOLN
RESPIRATORY_TRACT | Status: AC
  Administered 2016-09-15: 3 mL via RESPIRATORY_TRACT
  Filled 2016-09-15: qty 3

## 2016-09-15 NOTE — Progress Notes (Signed)
Patient ID: Collin [name], male   DOB: 24-Sep-1940, 76 y.o.   MRN: [id number]  Patient was scheduled for left PCN placement and right PCN exchange. Brought patient into IR suite.  He was placed on his left side and experienced severe respiratory distress and said "he felt like he was going to die."  Patient also reportedly turned "blue" according to the IR nurse.  Patient refused the procedures at that time. Patient agreed to an US guided right thoracentesis.  Successfully removed 1.3 liters from right chest and CXR showed markedly improved aeration in right chest.  Patient was also breathing better after the thoracentesis.  Asked patient if he would consider letting us exchange the right PCN while he was breathing better but he refused.  Hopefully, breathing will continue to improve and he will reconsider the PCN procedures next week.

## 2016-09-15 NOTE — Progress Notes (Signed)
Pharmacy Antibiotic Note  Collin [name] is a 76 y.o. male admitted on 09/09/2016 with sepsis Pharmacy has been consulted for vancomycin and meropenem dosing.  This is day #6 of IV antibiotics.   Plan: Continue meropenem 1 gram IV q12h  Continue vancomycin 750 mg IV q24h Vancomycin trough scheduled for 1/6 @ 1530 Goal vancomycin trough 15-20 mcg/mL  Height: 6\' 2"  (188 cm) Weight: 191 lb (86.6 kg) IBW/kg (Calculated) : 82.2  Temp (24hrs), Avg:98 F (36.7 C), Min:97.6 F (36.4 C), Max:98.3 F (36.8 C)   Recent Labs Lab 09/09/16 1817 09/09/16 2143 09/10/16 0139 09/10/16 0604 09/11/16 0515 09/12/16 0230 09/13/16 0436 09/14/16 0540  WBC 12.8*  --   --  8.3 18.0* 15.1* 16.5*  --   CREATININE 1.74*  --   --  1.63* 2.04* 2.23* 2.17* 2.00*  LATICACIDVEN 2.5* 2.5* 2.3* 1.5  --   --   --   --   VANCOTROUGH  --   --   --   --   --   --  25*  --     Estimated Creatinine Clearance: 38.2 mL/min (by C-G formula based on SCr of 2 mg/dL (H)).    Allergies  Allergen Reactions  . Lisinopril Anaphylaxis  . Metoprolol Other (See Comments)    Reaction:  Ringing in ears   . Gabapentin Rash   Antimicrobials this admission: vancomycin 12/30 >>  meropenem 12/31 >>  Cefepime x1 in ED  Dose adjustments this admission:  Microbiology results: 12/30 BCx: No growth final 12/30 UCx: multiple species, suggest recollection  Thank you for allowing pharmacy to be a part of this patient's care.  Lenis Noon, PharmD, BCPS Clinical Pharmacist 09/15/2016 11:27 AM

## 2016-09-15 NOTE — Care Management Important Message (Signed)
Important Message  Patient Details  Name: BRITISH LENDER MRN: [id number] Date of Birth: [birth year]   Medicare Important Message Given:  Yes    Katrina Stack, RN 09/15/2016, 10:09 AM

## 2016-09-15 NOTE — OR Nursing (Signed)
Pt placed on IR table prone, became cyanotic, increased dyspnea, reported sensation of dying, removed back to bed and returned to specials recovery,

## 2016-09-15 NOTE — Progress Notes (Signed)
Pt wants to go AMA. Wife is at the bedside. IV and tele removed. AMA form signed.

## 2016-09-15 NOTE — Progress Notes (Signed)
A & O. Pt went for nephro placement however was unable to complete. Pt did do a L thoracentesis and ~1200 was removed. Wife at the bedside. Pt reported pain and received PO meds. Takes meds ok. Pt has no further concerns at this time.

## 2016-09-15 NOTE — Discharge Summary (Signed)
Date of admission 09/09/2016 Date  leaving AGAINST MEDICAL ADVICE 09/15/2016   Brief history and physical  Collin [name]  is a 76 y.o. male who presents with Progressive shortness of breath. 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 His had a Pleurx catheter in for the past year per his and his wife's report. However, over the last couple months it has not been draining very much. For the last several days his shortness of breath increased so he came to the ED for evaluation. Here he was found to have his chronic effusion, though now it appears to be loculated. He also had a little bit of an elevated white blood cell count. He was started on antibiotics and hospitalists were called for admission. Of note, he has severe heart failure with significantly reduced ejection fraction, less than 15% on his last echocardiogram which was back in May 2017. Review history and physical, Hospital course and consultant notes for complete details  Hospital course  Review of system-reports he is doing okay Physical examination patient refused   * Sepsis ([place]) - suspected source is infection in his effusion. 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 Discontinue IV fluids .   *Recurrent right pleural effusion -  CT chest has revealed loculated right-sided pleural effusion  -Appreciate pulmonology recommendations  -Consult placed to thoracic surgery Dr. Faith [name], patient has right-sided Pleurx catheter placed one year ago ,  TPA into Pleurex cath on 1/2 2018 with no success - Pleurx catheter removal as an outpatient at patient's convenience per [facility] -Patient finally had  right-sided thoracocentesis and 1300 mL fluid taken out by interventional radiology. Fluid sent out for cytology and other labs -Outpatient follow-up with Dr. Genevive [name] is recommended. Patient wants to leave AGAINST MEDICAL ADVICE   *Left-sided hydronephrosis and nephrolithiasis, benign prostatic hypertrophy, meatus stenosis Renal ultrasound with moderate left-sided hydronephrosis no right-sided hydronephrosis  Patient is urinating,But incomplete emptying  Flomax started  Patient refused nephrostomy tubes placement during the hospital course today he is agreeable but could not tolerated the procedure , patient became cyanotic and procedure was canceled  Patient is deferring meatotomy and urethroplasty for meatal stenosis  *Hyperkalemia with renal failure Potassium at 5.1 today Follow up with nephrology. Not considering dialysis at this time as renal function is acceptable Discontinued entresto  *aki with hyperkalemia-post renal, probably component of renal etiology Avoid nephrotoxins,   patient temporarily had hemodialysis in the past Follow-up with nephrology. 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  The patient has aneurysmal degeneration of the proximal anastomosis of his open aneurysm repair performed in [place] in 2006. Maximum diameter is 4.7.  Vascular is recommending follow-up ultrasound as an outpatient in 6 months.  *Abnormal troponin-0.04-0.69-0.03-0.03 Troponin is elevated only once which could be a lab error. Patient is asymptomatic. Heparin  drip discontinued Cardiac consult placed , discussed with Collin [name] he is agreeable   *COPD (chronic obstructive pulmonary disease) ([place]) -  no exacerbation continue home inhalers and medications,  when necessary duo nebs  *  Chronic systolic CHF (congestive heart failure) ([facility]) - continue home meds, repeat echocardiogram 10-15% ejection fraction   follow-up with urology, nephrology  pulmonology, cardiology, thoracic surgery, vascular surgery  Patient is very noncompliant refusing medications and procedures,the consequences of refusing refusing the procedures were discussed with the patient he is aware and states he doesn't care  Poor prognosis  Patient was seen and evaluated after procedure. Stable but wants to leave the hospital [facility]. He understands the risks of leaving the hospital [place], he says he might come back to the hospital but today  he has to go home. Wife at bedside, agreeable with husband's decision   Patient will leave the hospital [place]  Time spent 43 minutes

## 2016-09-15 NOTE — Care Management (Addendum)
Thoracentesis today with removal of 1200 fluid.  Dr Genevive Bi relayed to attending and CM yesterday that switching out pleurx cath can be done as an outpatient.  Patient refused nephrostomy tube exchange today.  Patient is refusing to participate with physical therapy treatment. "I am not doing this while I am here."  Patient will not give permission for CM to give heads up referral to any home health agency

## 2016-09-15 NOTE — Progress Notes (Signed)
Urology follow up   09/15/16  Subjective:  1 - Nephrolithiasis - large volume Rt > Lt nephrolithiasis, history of retained / encrusted/ obstructed indwelling double-J right nephrostomy tube, managed with percutaneous nephrostomy tube. Last exchanged in 06/2016, due for exchange.  Previously was considering staged ureteroscopy to attempt to dislodge retained stent, however, failed to present on multiple occasions for preop PICC line/IV antibiotics, significantly noncomplaint.    2 - Acute on Chronic Renal Failure - baseline Cr 1.5-1.75 x years. Acute rise to 2.23 durting this admission. PVR 67mL (emptying bladder completely), reciving lasix + vanc.  Cr down to 2.0 yesterday.  3- Left hydropyonephrosis- hydronephrosis on left side down to bladder, previously felt to be related to incomplete bladder emptying but has been voiding well. No clear obstruction identified on CT scan.  Follow up RUS with persistent hydro.  Refused intervention on previous days.  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 Presently not bothored by obstructive voiding complaints. PVR 09/12/16 "55mL" (normal).    5 - Sepsis of Unknown Origin - lactic acidosis and ? Sepsis on admit. BCX negative. UCX non-clonal.  Today, scheduled for L perc, right tube exchange.  NPO for procedure,   Objective: Vital signs in last 24 hours: Temp:  [97.5 F (36.4 C)-98.3 F (36.8 C)] 98.3 F (36.8 C) (01/05 0337) Pulse Rate:  [64-81] 76 (01/05 0337) Resp:  [16-22] 16 (01/05 0337) BP: (115-140)/(64-76) 140/66 (01/05 0337) SpO2:  [98 %-100 %] 100 % (01/05 0337) Weight:  [191 lb 6.4 oz (86.8 kg)] 191 lb 6.4 oz (86.8 kg) (01/05 0337) Last BM Date: 09/14/16  Intake/Output from previous day: 01/04 0701 - 01/05 0700 In: 0  Out: 1600 [Urine:1500; Chest Tube:100] Intake/Output this shift: No intake/output data recorded.  General appearance: alert, cooperative, appears stated age.  Wife at bedside this AM. Eyes:  negative Nose: Nares normal. Septum midline. Mucosa normal. No drainage or sinus tenderness. Throat: lips, mucosa, and tongue normal; teeth and gums normal Neck: supple, symmetrical, trachea midline Resp: shallow, mild increase WOB in [place] O2.  GI: soft, non-tender; bowel sounds normal; no masses,  no organomegaly and large midline scar w/o hernias.  Male genitalia: Copious clear urine from Rt PCN tube. clear yellow urine in bedside urinal.  Neurologic: Grossly normal  Lab Results:   Recent Labs  09/13/16 0436  WBC 16.5*  HGB 9.5*  HCT 32.1*  PLT 235   BMET  Recent Labs  09/13/16 0436 09/14/16 0540  NA 137 136  K 4.8 5.1  CL 109 108  CO2 21* 22  GLUCOSE 140* 125*  BUN 66* 65*  CREATININE 2.17* 2.00*  CALCIUM 8.4* 8.1*   PT/INR No results for input(s): LABPROT, INR in the last 72 hours. ABG No results for input(s): PHART, HCO3 in the last 72 hours.  Invalid input(s): PCO2, PO2  Studies/Results: US Renal  Result Date: 09/14/2016 CLINICAL DATA:  Nephrolithiasis, renal obstruction and renal failure. Status post previous placement of right-sided percutaneous nephrostomy to on 08/25/2015. EXAM: RENAL / URINARY TRACT ULTRASOUND COMPLETE COMPARISON:  CT of the abdomen and pelvis without contrast on 09/10/2016 and renal ultrasound on 08/23/2015 FINDINGS: Right Kidney: Length: 12.5 cm. The renal collecting system it is decompressed by a nephrostomy tube. Nonobstructing calculi are seen, especially in the upper pole. The renal cortex is mildly echogenic, likely reflecting a component of chronic kidney disease. No significant atrophy of the right kidney is identified. Left Kidney: Length: 10.3 cm. Moderate hydronephrosis. Renal parenchyma is  thinned and atrophic. Parenchymal loss of the left kidney does appear to be progressive since the prior renal ultrasound in 2016. Multiple shadowing calculi are seen scattered throughout the collecting system. Dilated extrarenal pelvis present.  Bladder: Mildly distended. Distal portion of a ureteral stent is visualized in the dependent bladder. There may be a mild amount of urinary debris in the bladder. IMPRESSION: 1. No evidence of right hydronephrosis. The right renal cortex is mildly echogenic, likely reflecting a component of chronic kidney disease. Nonobstructing calculi are seen on the right. 2. Moderate left hydronephrosis with thinned and atrophic left renal parenchyma. Parenchymal loss appears to be progressive since a prior renal ultrasound in 2016. Electronically Signed   By: Aletta Edouard M.D.   On: 09/14/2016 13:45    Anti-infectives: Anti-infectives    Start     Dose/Rate Route Frequency Ordered Stop   09/13/16 1600  vancomycin (VANCOCIN) IVPB 750 mg/150 ml premix     750 mg 150 mL/hr over 60 Minutes Intravenous Every 24 hours 09/13/16 0529     09/10/16 0300  vancomycin (VANCOCIN) 1,250 mg in sodium chloride 0.9 % 250 mL IVPB  Status:  Discontinued     1,250 mg 166.7 mL/hr over 90 Minutes Intravenous Every 24 hours 09/09/16 2311 09/12/16 1143   09/10/16 0200  meropenem (MERREM) 1 g in sodium chloride 0.9 % 100 mL IVPB  Status:  Discontinued     1 g 200 mL/hr over 30 Minutes Intravenous Every 12 hours 09/09/16 2311 09/09/16 2347   09/10/16 0200  meropenem (MERREM) IVPB SOLR 1 g     1 g 100 mL/hr over 30 Minutes Intravenous Every 12 hours 09/09/16 2349     09/09/16 1915  vancomycin (VANCOCIN) IVPB 1000 mg/200 mL premix     1,000 mg 200 mL/hr over 60 Minutes Intravenous  Once 09/09/16 1914 09/09/16 2146   09/09/16 1915  ceFEPIme (MAXIPIME) 1 GM / 52mL IVPB premix     1 g 100 mL/hr over 30 Minutes Intravenous STAT 09/09/16 1914 09/09/16 2027      Assessment/Plan:  1 - Nephrolithiasis/ retained right ureteral stent -not currently operative candidate, this seems unlikely given his CV comorbidity.   2 - Acute on Chronic Renal Failure/ new left hydronephrosis- present Cr rise most likely pre-renal + vanco induced.   Improving but not back to baseline ~1.6.  CT findings were reviewed with Dr. Yehuda Mao. I recommended consideration of a left-sided percutaneous nephrostomy tube in addition to his right-sided tube. Diagnostic imaging in the form of antegrade nephrostogram could help define the underlying etiology.  Refused procedure yesterday..  Agreed for left PCN today with antegrade and right tube exchange.  Lengthy discussion this AM about importance of compliance.   3 - Urethral Stricture - asymptomatic. No indication for putting him through aggressive dilation procedures as he empties well.   4 - Sepsis of Unknown Origin - low suspicion for urinary soursce as UCX non-clonal.    Hollice Espy 09/15/2016

## 2016-09-15 NOTE — Procedures (Signed)
Successful US guided right thoracentesis.  Removed 1.3 liters of yellow fluid.  No immediate complication.   Discussed attempting right PCN exchange or left PCN placement with patient after thoracentesis.  Patient refuses these procedures at this time because "he feels like he will die" if he lies on his side or stomach.   Will get CXR after thoracentesis.

## 2016-09-15 NOTE — Progress Notes (Signed)
Subjective: Patient is known to IR.  Scheduled for Left PCN and right PCN exchange. No new complaints today. Persistent shortness of breath. Patient is agreeable to procedures today.   Objective: Vital signs in last 24 hours: Temp:  [97.5 F (36.4 C)-98.3 F (36.8 C)] 98.3 F (36.8 C) (01/05 0337) Pulse Rate:  [64-81] 66 (01/05 0846) Resp:  [12-22] 12 (01/05 0846) BP: (115-149)/(64-83) 149/83 (01/05 0846) SpO2:  [91 %-100 %] 91 % (01/05 0846) Weight:  [191 lb (86.6 kg)-191 lb 6.4 oz (86.8 kg)] 191 lb (86.6 kg) (01/05 0846) Last BM Date: 09/14/16  Intake/Output from previous day: 01/04 0701 - 01/05 0700 In: 0  Out: 1600 [Urine:1500; Chest Tube:100] Intake/Output this shift: No intake/output data recorded.  PE: Lungs:  Extensive wheezing in both lungs. Heart:  Cannot hear heart sounds due to chest wheezing Abd:  Soft, bowel sounds present.  Rt PCN not evaluated at this time.   Lab Results:   Recent Labs  09/13/16 0436  WBC 16.5*  HGB 9.5*  HCT 32.1*  PLT 235   BMET  Recent Labs  09/13/16 0436 09/14/16 0540  NA 137 136  K 4.8 5.1  CL 109 108  CO2 21* 22  GLUCOSE 140* 125*  BUN 66* 65*  CREATININE 2.17* 2.00*  CALCIUM 8.4* 8.1*   PT/INR No results for input(s): LABPROT, INR in the last 72 hours. ABG No results for input(s): PHART, HCO3 in the last 72 hours.  Invalid input(s): PCO2, PO2  Studies/Results: US Renal  Result Date: 09/14/2016 CLINICAL DATA:  Nephrolithiasis, renal obstruction and renal failure. Status post previous placement of right-sided percutaneous nephrostomy to on 08/25/2015. EXAM: RENAL / URINARY TRACT ULTRASOUND COMPLETE COMPARISON:  CT of the abdomen and pelvis without contrast on 09/10/2016 and renal ultrasound on 08/23/2015 FINDINGS: Right Kidney: Length: 12.5 cm. The renal collecting system it is decompressed by a nephrostomy tube. Nonobstructing calculi are seen, especially in the upper pole. The renal cortex is mildly  echogenic, likely reflecting a component of chronic kidney disease. No significant atrophy of the right kidney is identified. Left Kidney: Length: 10.3 cm. Moderate hydronephrosis. Renal parenchyma is thinned and atrophic. Parenchymal loss of the left kidney does appear to be progressive since the prior renal ultrasound in 2016. Multiple shadowing calculi are seen scattered throughout the collecting system. Dilated extrarenal pelvis present. Bladder: Mildly distended. Distal portion of a ureteral stent is visualized in the dependent bladder. There may be a mild amount of urinary debris in the bladder. IMPRESSION: 1. No evidence of right hydronephrosis. The right renal cortex is mildly echogenic, likely reflecting a component of chronic kidney disease. Nonobstructing calculi are seen on the right. 2. Moderate left hydronephrosis with thinned and atrophic left renal parenchyma. Parenchymal loss appears to be progressive since a prior renal ultrasound in 2016. Electronically Signed   By: Aletta Edouard M.D.   On: 09/14/2016 13:45    Anti-infectives: Anti-infectives    Start     Dose/Rate Route Frequency Ordered Stop   09/13/16 1600  vancomycin (VANCOCIN) IVPB 750 mg/150 ml premix     750 mg 150 mL/hr over 60 Minutes Intravenous Every 24 hours 09/13/16 0529     09/10/16 0300  vancomycin (VANCOCIN) 1,250 mg in sodium chloride 0.9 % 250 mL IVPB  Status:  Discontinued     1,250 mg 166.7 mL/hr over 90 Minutes Intravenous Every 24 hours 09/09/16 2311 09/12/16 1143   09/10/16 0200  meropenem (MERREM) 1 g in sodium chloride 0.9 %  100 mL IVPB  Status:  Discontinued     1 g 200 mL/hr over 30 Minutes Intravenous Every 12 hours 09/09/16 2311 09/09/16 2347   09/10/16 0200  meropenem (MERREM) IVPB SOLR 1 g     1 g 100 mL/hr over 30 Minutes Intravenous Every 12 hours 09/09/16 2349     09/09/16 1915  vancomycin (VANCOCIN) IVPB 1000 mg/200 mL premix     1,000 mg 200 mL/hr over 60 Minutes Intravenous  Once 09/09/16  1914 09/09/16 2146   09/09/16 1915  ceFEPIme (MAXIPIME) 1 GM / 31mL IVPB premix     1 g 100 mL/hr over 30 Minutes Intravenous STAT 09/09/16 1914 09/09/16 2027      Assessment/Plan: 76 yo with multiple medical problems including acute on chronic renal disease, nephrolithiasis and new left hydronephrosis.  Plan for right PCN exchange and attempt left PCN placement.  Patient is high risk for even moderate sedation.  This was discussed with patient and wife.  Will attempt left PCN with minimal sedation or no sedation.  Plan for right PCN exchange.  Patient is receiving scheduled antibiotics in hospital, will not give additional antibiotics.     LOS: 6 days    Collin [name] Collin [name] 09/15/2016

## 2016-09-15 NOTE — Progress Notes (Signed)
PT Cancellation Note  Patient Details Name: Collin [name] MRN: [id number] DOB: 1941/05/19   Cancelled Treatment:    Reason Eval/Treat Not Completed: Patient declined, no reason specified.  Pt refusing PT today.  Discussed with pt attempting PT at another time (d/t pt s/p procedure this morning) but pt stating that he is not going to do any therapy when he is in the hospital.  When therapist asked why, pt stated that he is "not able" to do it in the hospital (did not specify further) and that he would do it when he returned home when he was able.  Due to multiple attempts to eval pt and pt continuously adamantly refusing to participate in PT during hospital stay, will discontinue current PT order (CM notified).  Please re-consult PT as appropriate and if pt is willing to participate in PT.  Raquel Sarna Lillyanna Glandon 09/15/2016, 12:19 PM Leitha Bleak, [place]

## 2016-09-16 LAB — MISC LABCORP TEST (SEND OUT): Labcorp test code: 19588

## 2016-09-16 LAB — TRIGLYCERIDES, BODY FLUIDS: Triglycerides, Fluid: <9 mg/dL

## 2016-09-17 LAB — PH, BODY FLUID: pH, Body Fluid: 7.6

## 2016-09-18 LAB — CYTOLOGY - NON PAP

## 2016-09-19 DIAGNOSIS — R918 Other nonspecific abnormal finding of lung field: Secondary | ICD-10-CM | POA: Diagnosis not present

## 2016-09-19 DIAGNOSIS — I5023 Acute on chronic systolic (congestive) heart failure: Secondary | ICD-10-CM | POA: Diagnosis not present

## 2016-09-19 DIAGNOSIS — I517 Cardiomegaly: Secondary | ICD-10-CM | POA: Diagnosis not present

## 2016-09-19 DIAGNOSIS — E875 Hyperkalemia: Secondary | ICD-10-CM | POA: Diagnosis not present

## 2016-09-19 DIAGNOSIS — I130 Hypertensive heart and chronic kidney disease with heart failure and stage 1 through stage 4 chronic kidney disease, or unspecified chronic kidney disease: Secondary | ICD-10-CM | POA: Diagnosis not present

## 2016-09-19 DIAGNOSIS — R079 Chest pain, unspecified: Secondary | ICD-10-CM | POA: Diagnosis not present

## 2016-09-19 DIAGNOSIS — R0602 Shortness of breath: Secondary | ICD-10-CM | POA: Diagnosis not present

## 2016-09-19 DIAGNOSIS — J9611 Chronic respiratory failure with hypoxia: Secondary | ICD-10-CM | POA: Diagnosis not present

## 2016-09-19 DIAGNOSIS — A0472 Enterocolitis due to Clostridium difficile, not specified as recurrent: Secondary | ICD-10-CM | POA: Diagnosis not present

## 2016-09-19 DIAGNOSIS — A419 Sepsis, unspecified organism: Secondary | ICD-10-CM | POA: Diagnosis not present

## 2016-09-19 DIAGNOSIS — N179 Acute kidney failure, unspecified: Secondary | ICD-10-CM | POA: Diagnosis not present

## 2016-09-19 DIAGNOSIS — R062 Wheezing: Secondary | ICD-10-CM | POA: Diagnosis not present

## 2016-09-19 DIAGNOSIS — R1013 Epigastric pain: Secondary | ICD-10-CM | POA: Diagnosis not present

## 2016-09-19 DIAGNOSIS — J90 Pleural effusion, not elsewhere classified: Secondary | ICD-10-CM | POA: Diagnosis not present

## 2016-09-19 DIAGNOSIS — R0902 Hypoxemia: Secondary | ICD-10-CM | POA: Diagnosis not present

## 2016-09-20 DIAGNOSIS — H2510 Age-related nuclear cataract, unspecified eye: Secondary | ICD-10-CM | POA: Diagnosis present

## 2016-09-20 DIAGNOSIS — K219 Gastro-esophageal reflux disease without esophagitis: Secondary | ICD-10-CM | POA: Diagnosis present

## 2016-09-20 DIAGNOSIS — A419 Sepsis, unspecified organism: Secondary | ICD-10-CM | POA: Diagnosis not present

## 2016-09-20 DIAGNOSIS — I130 Hypertensive heart and chronic kidney disease with heart failure and stage 1 through stage 4 chronic kidney disease, or unspecified chronic kidney disease: Secondary | ICD-10-CM | POA: Diagnosis present

## 2016-09-20 DIAGNOSIS — N183 Chronic kidney disease, stage 3 (moderate): Secondary | ICD-10-CM | POA: Diagnosis present

## 2016-09-20 DIAGNOSIS — E1122 Type 2 diabetes mellitus with diabetic chronic kidney disease: Secondary | ICD-10-CM | POA: Diagnosis present

## 2016-09-20 DIAGNOSIS — I5023 Acute on chronic systolic (congestive) heart failure: Secondary | ICD-10-CM | POA: Diagnosis present

## 2016-09-20 DIAGNOSIS — E875 Hyperkalemia: Secondary | ICD-10-CM | POA: Diagnosis not present

## 2016-09-20 DIAGNOSIS — R0602 Shortness of breath: Secondary | ICD-10-CM | POA: Diagnosis not present

## 2016-09-20 DIAGNOSIS — R0902 Hypoxemia: Secondary | ICD-10-CM | POA: Diagnosis not present

## 2016-09-20 DIAGNOSIS — M48061 Spinal stenosis, lumbar region without neurogenic claudication: Secondary | ICD-10-CM | POA: Diagnosis present

## 2016-09-20 DIAGNOSIS — R000 Tachycardia, unspecified: Secondary | ICD-10-CM | POA: Diagnosis not present

## 2016-09-20 DIAGNOSIS — J90 Pleural effusion, not elsewhere classified: Secondary | ICD-10-CM | POA: Diagnosis present

## 2016-09-20 DIAGNOSIS — N1330 Unspecified hydronephrosis: Secondary | ICD-10-CM | POA: Diagnosis not present

## 2016-09-20 DIAGNOSIS — C187 Malignant neoplasm of sigmoid colon: Secondary | ICD-10-CM | POA: Diagnosis present

## 2016-09-20 DIAGNOSIS — D509 Iron deficiency anemia, unspecified: Secondary | ICD-10-CM | POA: Diagnosis present

## 2016-09-20 DIAGNOSIS — Z9981 Dependence on supplemental oxygen: Secondary | ICD-10-CM | POA: Diagnosis not present

## 2016-09-20 DIAGNOSIS — Z89612 Acquired absence of left leg above knee: Secondary | ICD-10-CM | POA: Diagnosis not present

## 2016-09-20 DIAGNOSIS — Z436 Encounter for attention to other artificial openings of urinary tract: Secondary | ICD-10-CM | POA: Diagnosis not present

## 2016-09-20 DIAGNOSIS — R079 Chest pain, unspecified: Secondary | ICD-10-CM | POA: Diagnosis not present

## 2016-09-20 DIAGNOSIS — J9811 Atelectasis: Secondary | ICD-10-CM | POA: Diagnosis not present

## 2016-09-20 DIAGNOSIS — N132 Hydronephrosis with renal and ureteral calculous obstruction: Secondary | ICD-10-CM | POA: Diagnosis present

## 2016-09-20 DIAGNOSIS — N2889 Other specified disorders of kidney and ureter: Secondary | ICD-10-CM | POA: Diagnosis not present

## 2016-09-20 DIAGNOSIS — E785 Hyperlipidemia, unspecified: Secondary | ICD-10-CM | POA: Diagnosis present

## 2016-09-20 DIAGNOSIS — B9689 Other specified bacterial agents as the cause of diseases classified elsewhere: Secondary | ICD-10-CM | POA: Diagnosis not present

## 2016-09-20 DIAGNOSIS — R918 Other nonspecific abnormal finding of lung field: Secondary | ICD-10-CM | POA: Diagnosis not present

## 2016-09-20 DIAGNOSIS — J441 Chronic obstructive pulmonary disease with (acute) exacerbation: Secondary | ICD-10-CM | POA: Diagnosis present

## 2016-09-20 DIAGNOSIS — N179 Acute kidney failure, unspecified: Secondary | ICD-10-CM | POA: Diagnosis present

## 2016-09-20 DIAGNOSIS — R140 Abdominal distension (gaseous): Secondary | ICD-10-CM | POA: Diagnosis not present

## 2016-09-20 DIAGNOSIS — F17210 Nicotine dependence, cigarettes, uncomplicated: Secondary | ICD-10-CM | POA: Diagnosis present

## 2016-09-20 DIAGNOSIS — E1151 Type 2 diabetes mellitus with diabetic peripheral angiopathy without gangrene: Secondary | ICD-10-CM | POA: Diagnosis present

## 2016-09-20 DIAGNOSIS — R1013 Epigastric pain: Secondary | ICD-10-CM | POA: Diagnosis not present

## 2016-09-20 DIAGNOSIS — N1339 Other hydronephrosis: Secondary | ICD-10-CM | POA: Diagnosis not present

## 2016-09-20 DIAGNOSIS — H3530 Unspecified macular degeneration: Secondary | ICD-10-CM | POA: Diagnosis present

## 2016-09-20 DIAGNOSIS — N390 Urinary tract infection, site not specified: Secondary | ICD-10-CM | POA: Diagnosis present

## 2016-09-20 DIAGNOSIS — R062 Wheezing: Secondary | ICD-10-CM | POA: Diagnosis not present

## 2016-09-20 DIAGNOSIS — I517 Cardiomegaly: Secondary | ICD-10-CM | POA: Diagnosis not present

## 2016-09-20 DIAGNOSIS — J9611 Chronic respiratory failure with hypoxia: Secondary | ICD-10-CM | POA: Diagnosis present

## 2016-09-20 DIAGNOSIS — N401 Enlarged prostate with lower urinary tract symptoms: Secondary | ICD-10-CM | POA: Diagnosis present

## 2016-09-20 DIAGNOSIS — R1312 Dysphagia, oropharyngeal phase: Secondary | ICD-10-CM | POA: Diagnosis not present

## 2016-09-20 DIAGNOSIS — A0472 Enterocolitis due to Clostridium difficile, not specified as recurrent: Secondary | ICD-10-CM | POA: Diagnosis not present

## 2016-09-20 LAB — CHOLESTEROL, BODY FLUID: Cholesterol, Fluid: 15 mg/dL

## 2016-09-21 DIAGNOSIS — J90 Pleural effusion, not elsewhere classified: Secondary | ICD-10-CM | POA: Diagnosis not present

## 2016-09-29 IMAGING — CT CT CHEST W/O CM
1 series · 15 of 31 positions shown, 19 images · non-contrast
Comparison: 03/04/2015

CLINICAL DATA: Admitted to hospital for shortness of breath and
chest pain

EXAM:
CT CHEST WITHOUT CONTRAST
TECHNIQUE: Multidetector CT imaging of the chest was performed following the
standard protocol without IV contrast.

[Series 2: routine chest wo · axial · 0.70mm/px · z∈[-100,+230]mm · 15 of 72 slices shown, 19 images]
[im 3/72  mediastinal]
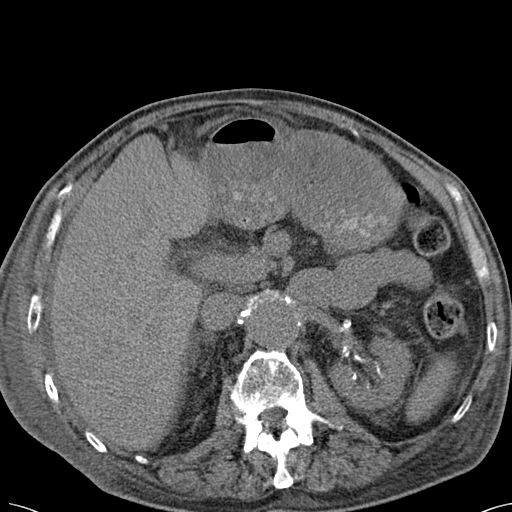
[im 3/72  lung]
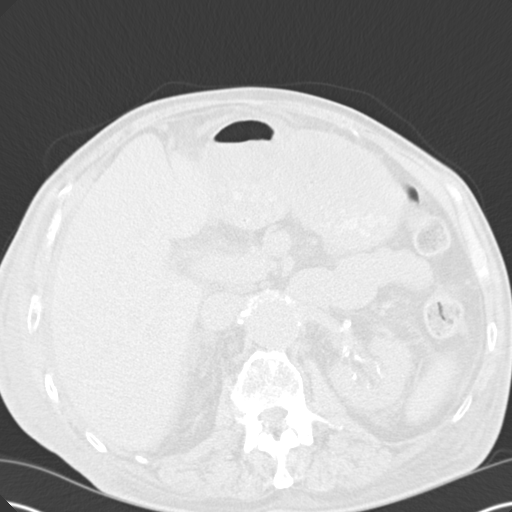
[im 8/72  lung]
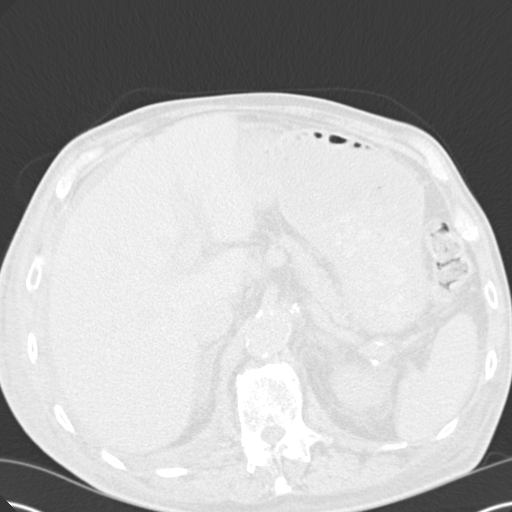
[im 14/72  lung]
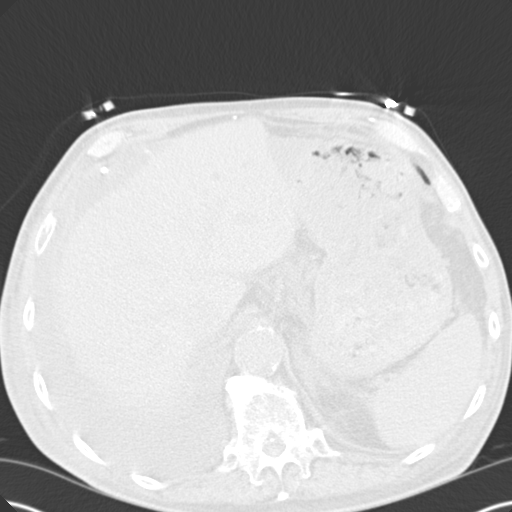
[im 16/72  lung]
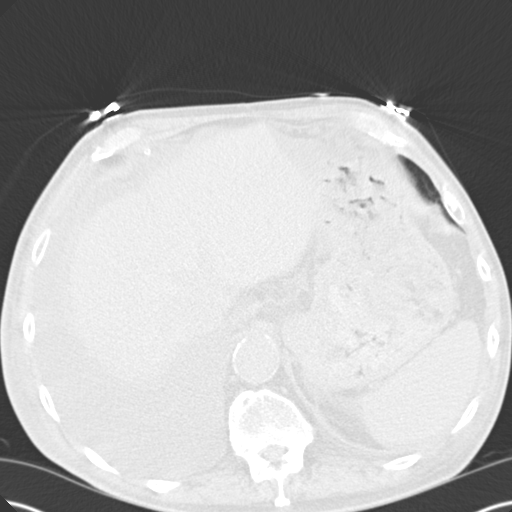
[im 22/72  mediastinal]
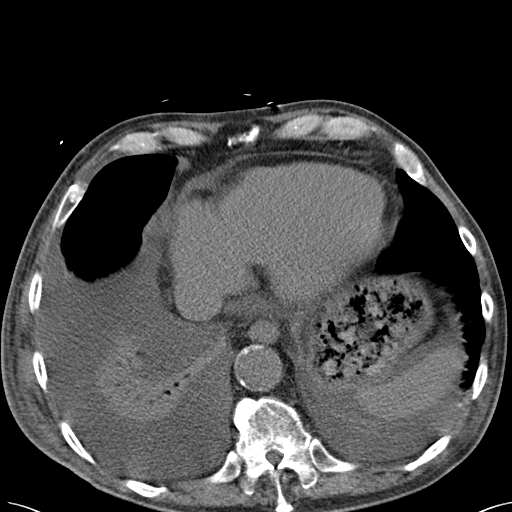
[im 22/72  lung]
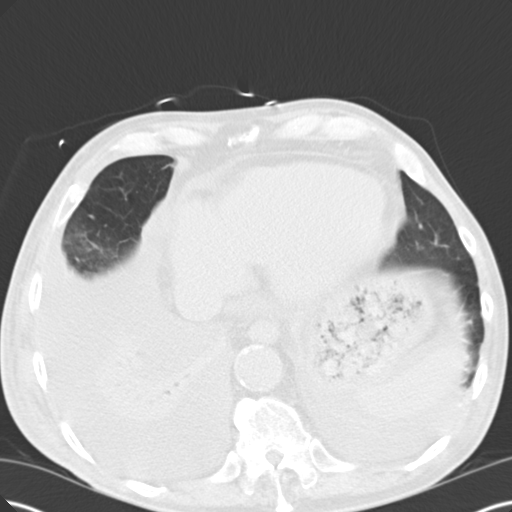
[im 27/72  lung]
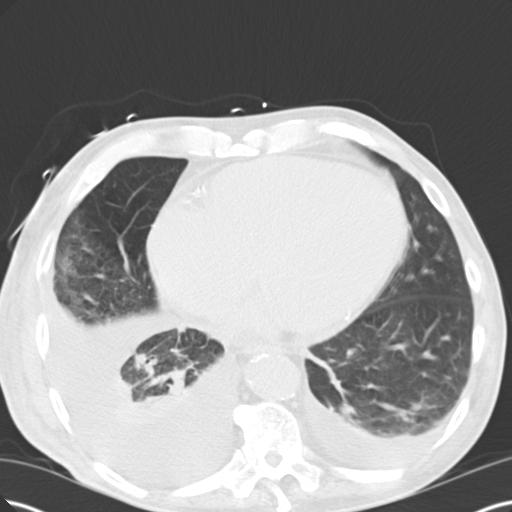
[im 32/72  lung]
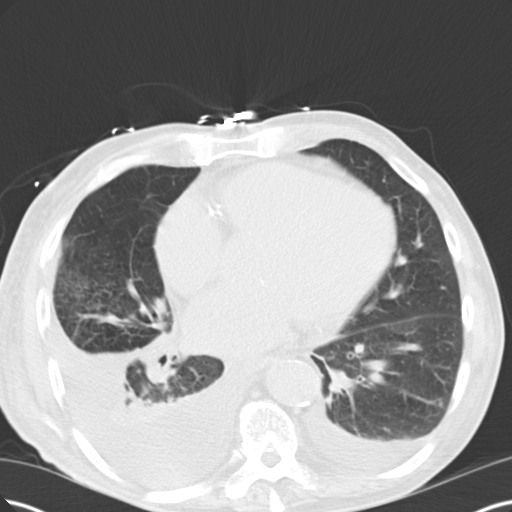
[im 37/72  lung]
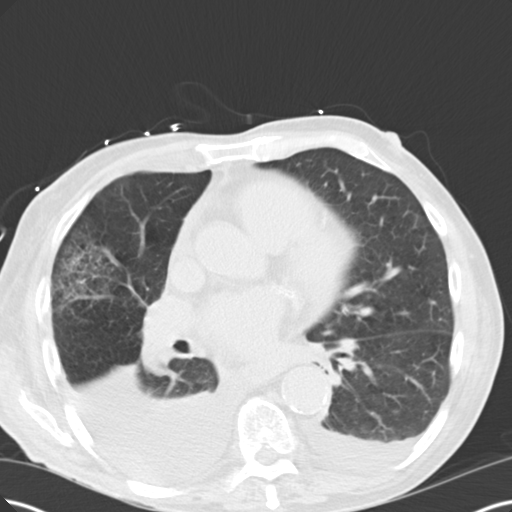
[im 40/72  mediastinal]
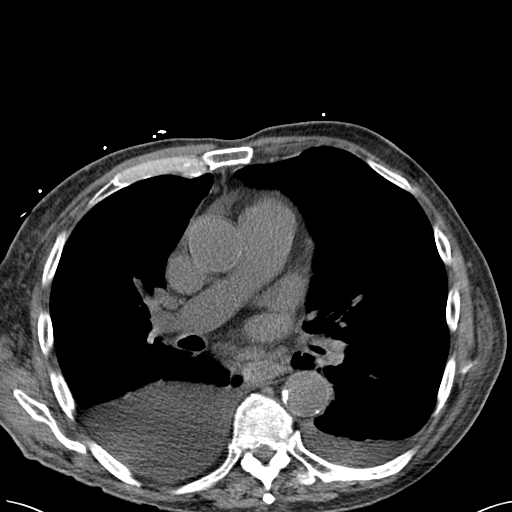
[im 40/72  lung]
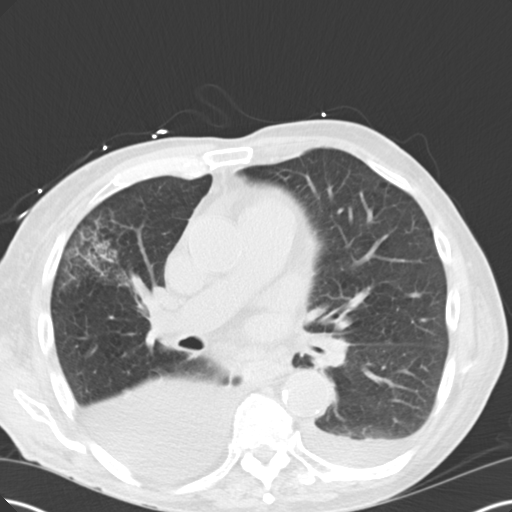
[im 45/72  lung]
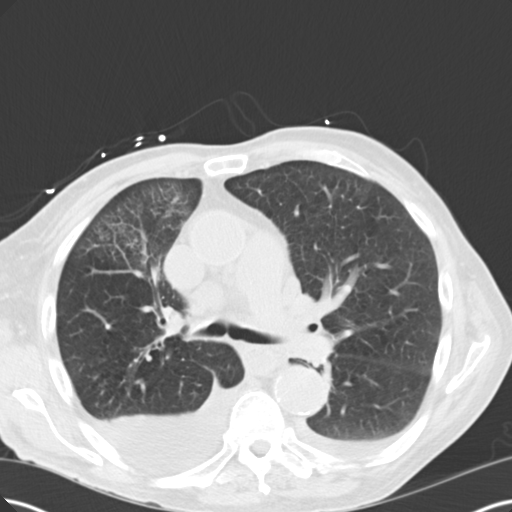
[im 50/72  lung]
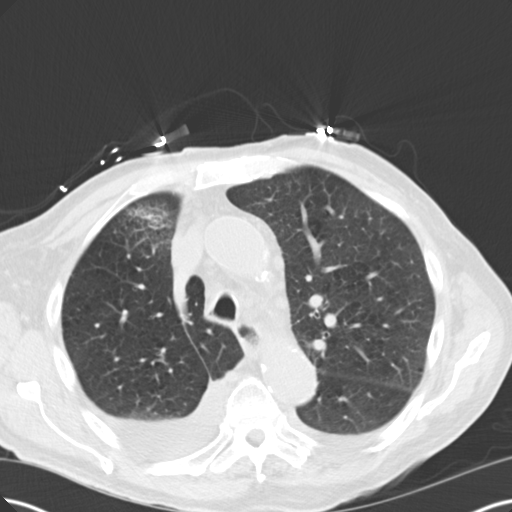
[im 56/72  lung]
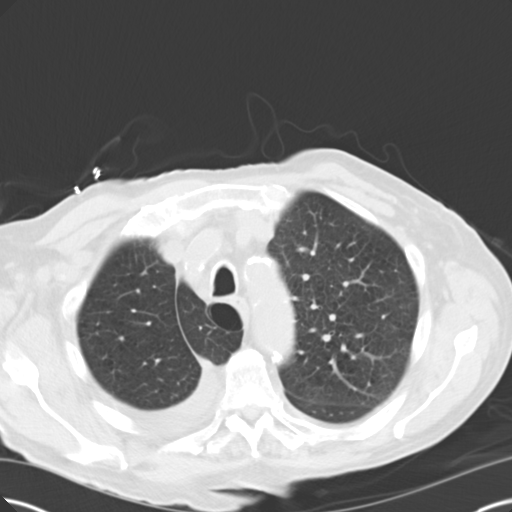
[im 58/72  mediastinal]
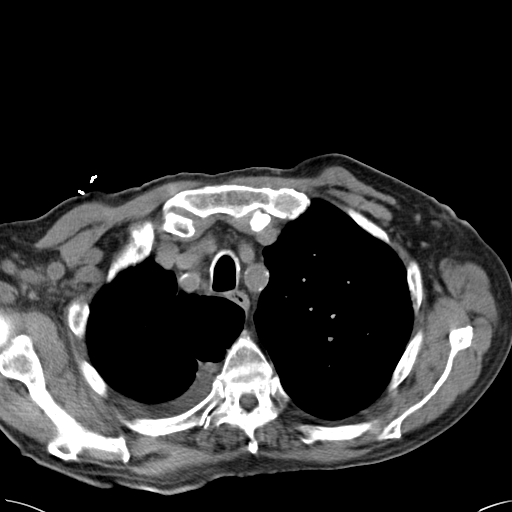
[im 58/72  lung]
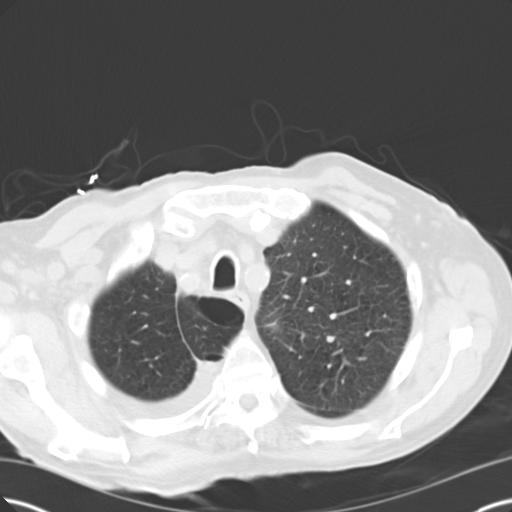
[im 64/72  lung]
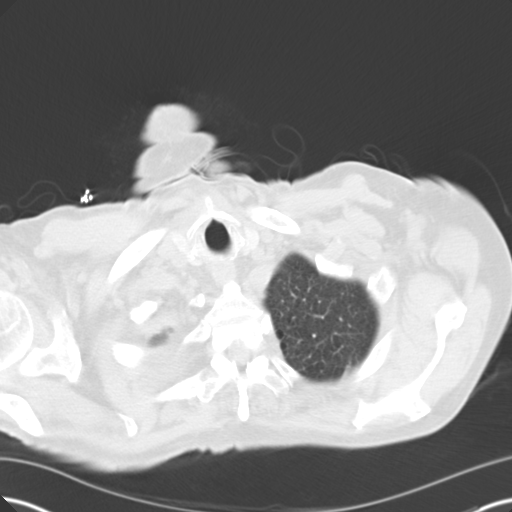
[im 69/72  lung]
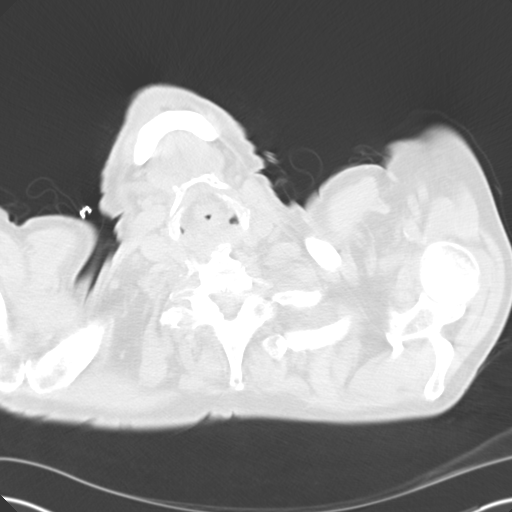

[15 of 31 positions shown; findings below may reference images not displayed]

FINDINGS: Mediastinum: Moderate cardiac enlargement. Aortic atherosclerosis
noted. Calcifications within the RCA, LAD and left circumflex
coronary artery noted. Trachea appears patent and is midline. Normal
appearance of the esophagus. Numerous prominent mediastinal nodes
are identified. Index right paratracheal lymph node measures 1.8 cm,
image 27/series 2. Pre-vascular lymph node is prominent measuring 11
mm, image 25/series 2.

Lungs/Pleura: Small left pleural effusion and moderate right pleural
effusion noted. There is associated compressive type atelectasis
involving both lower lobes. Moderate changes of centrilobular
emphysema. There is a pulmonary nodule within the left upper lobe
measuring 7 mm, image 37/series 3.

Upper Abdomen: Low-attenuation structure in the left hepatic lobe
measures 6 mm and is too small to characterize. The adrenal glands
appear normal. Normal appearance of the spleen. The visualized
portions of the pancreas are unremarkable.

Musculoskeletal: There is mild multi level degenerative disc disease
identified within the thoracic spine. No aggressive lytic or
sclerotic bone lesions identified.
IMPRESSION: 1. Cardiac enlargement and bilateral pleural effusions consistent
with CHF.
2. Nonspecific pneumonitis within the right upper lobe may be
infectious or inflammatory in etiology.
3. Left upper lobe pulmonary nodule measures 7 mm. If the patient is
at high risk for bronchogenic carcinoma, follow-up chest CT at
3-2months is recommended. If the patient is at low risk for
bronchogenic carcinoma, follow-up chest CT at 6-12 months is
recommended. This recommendation follows the consensus statement:
Guidelines for Management of Small Pulmonary Nodules Detected on CT
Scans: A Statement from the [HOSPITAL] as published in
4. Aortic atherosclerosis.

## 2016-09-30 IMAGING — US US ABDOMEN LIMITED
1 series · 14 of 16 positions shown · non-contrast
Comparison: None.

CLINICAL DATA: Ascites.

EXAM:
LIMITED ABDOMEN ULTRASOUND FOR ASCITES
TECHNIQUE: Limited ultrasound survey for ascites was performed in all four
abdominal quadrants.

[Series 1: us abdomen limited · 0.31mm/px · 14 of 16 slices shown]
[im 1/16]
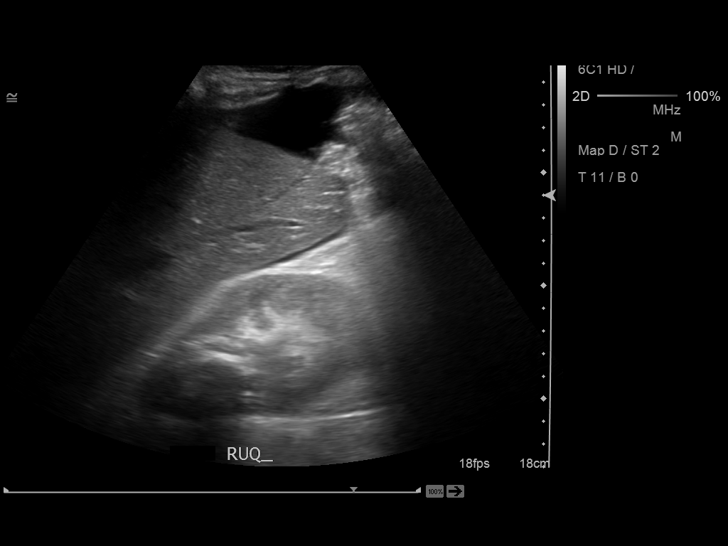
[im 2/16]
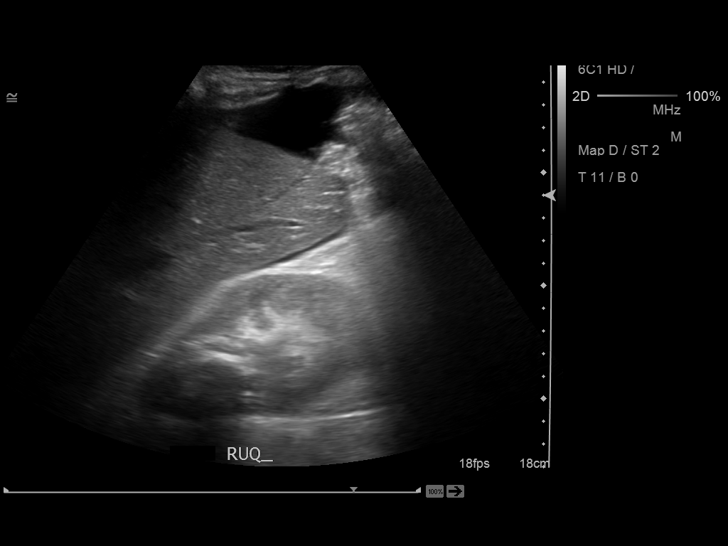
[im 3/16]
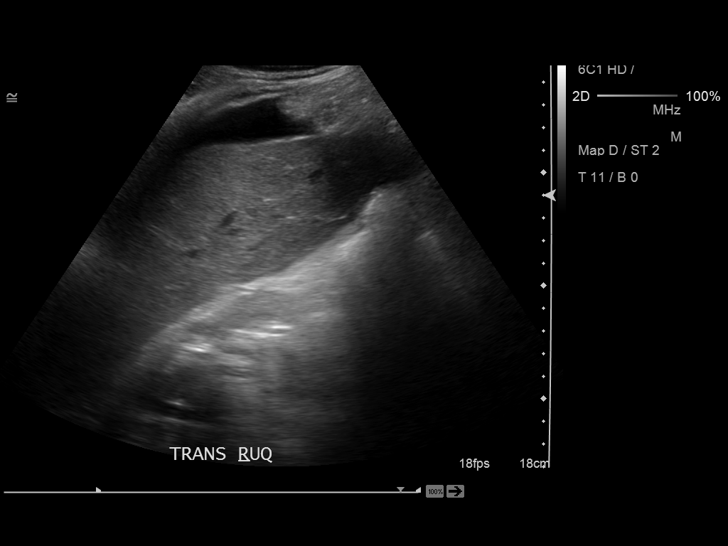
[im 5/16]
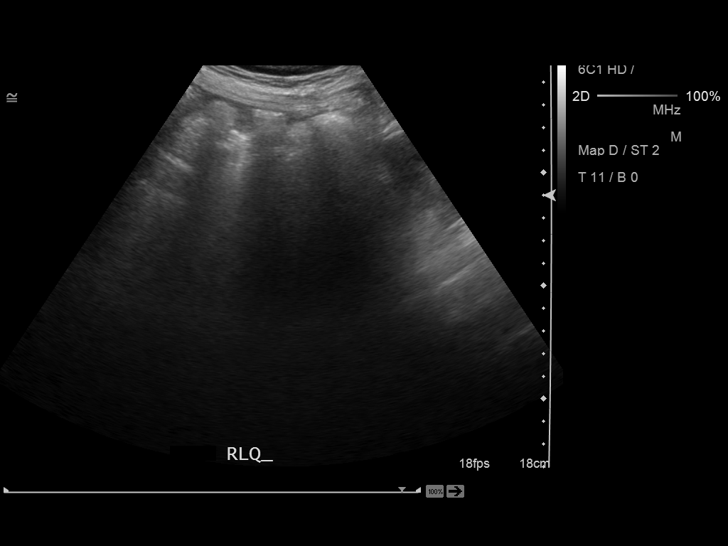
[im 6/16]
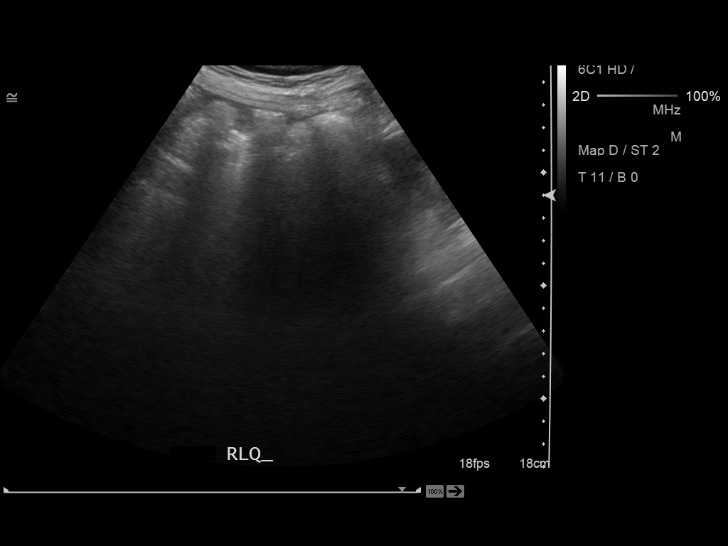
[im 7/16]
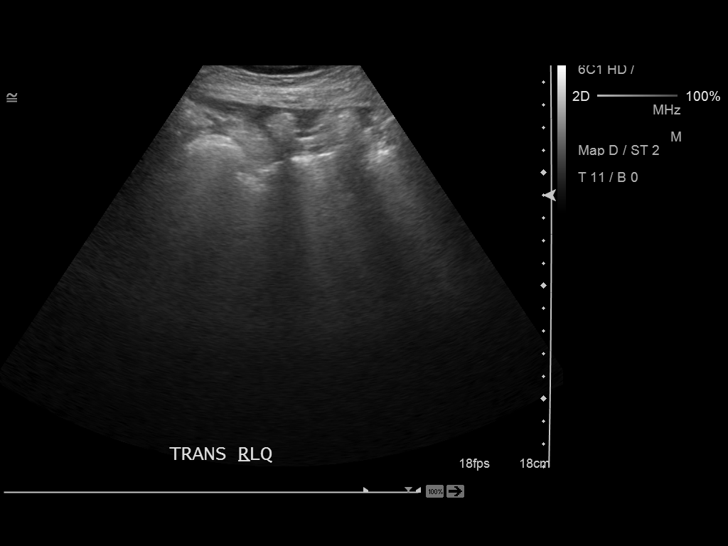
[im 8/16]
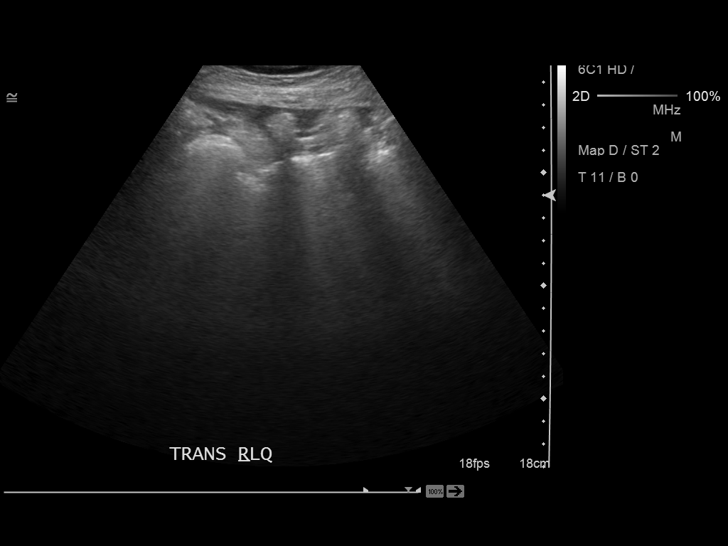
[im 9/16]
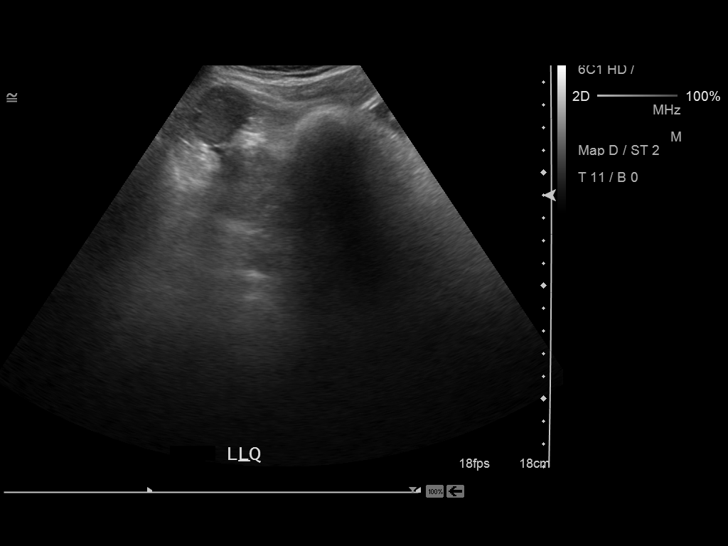
[im 10/16]
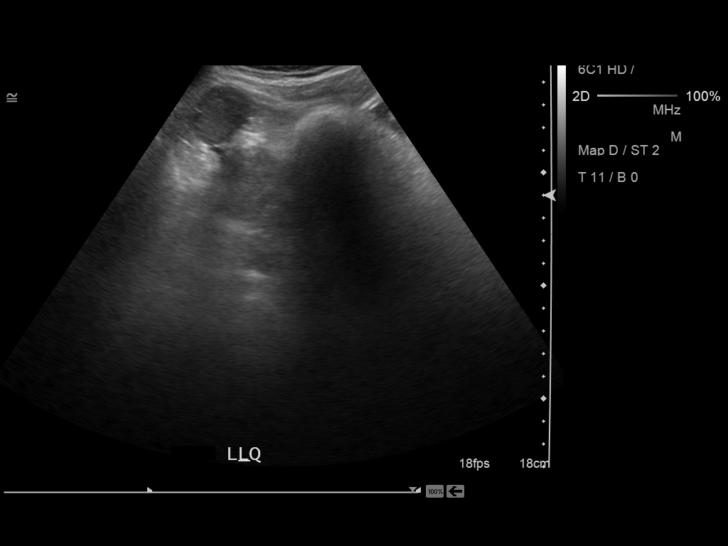
[im 11/16]
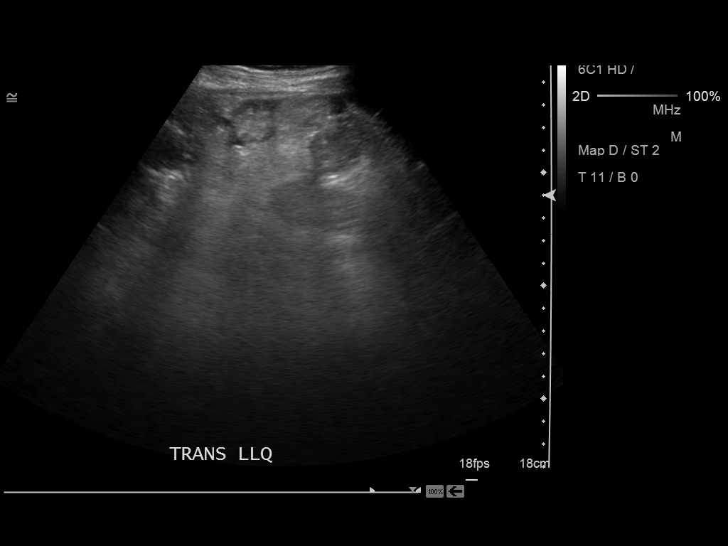
[im 13/16]
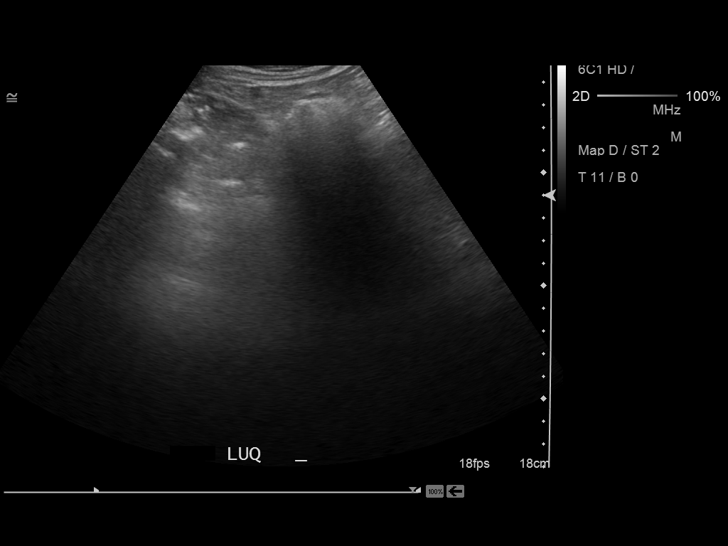
[im 14/16]
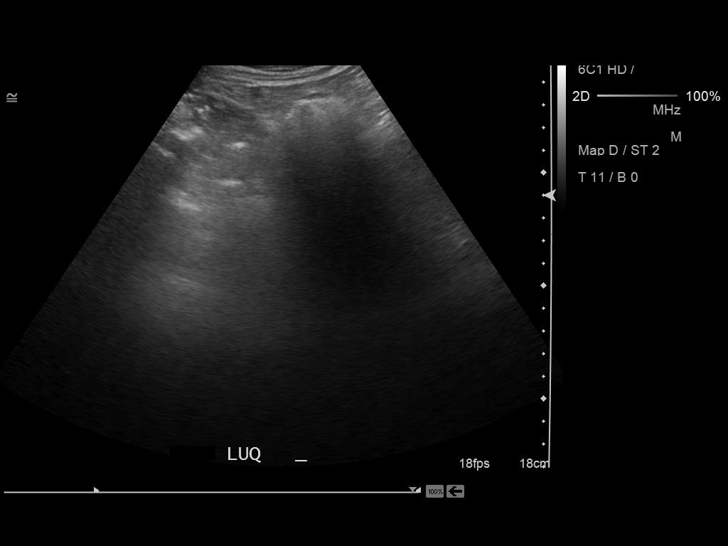
[im 15/16]
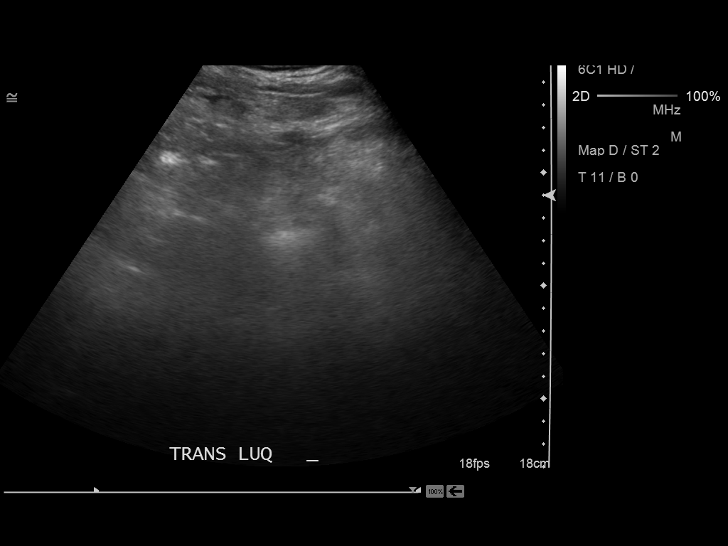
[im 16/16]
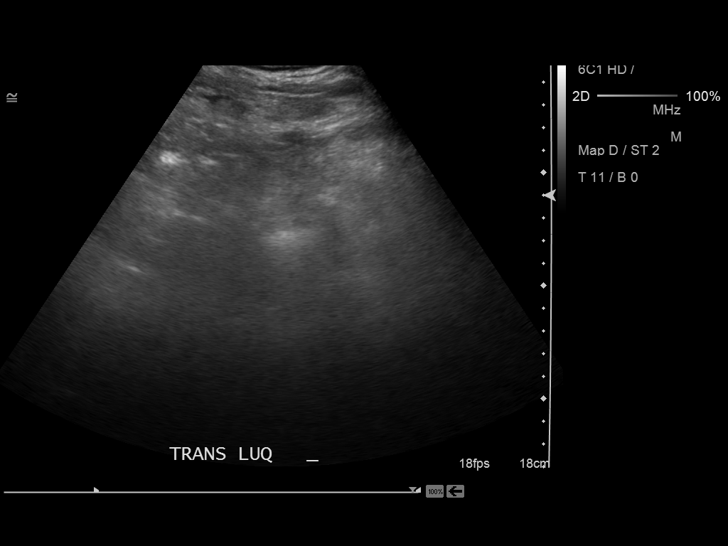

[14 of 16 positions shown; findings below may reference images not displayed]

FINDINGS: Small amount of fluid is noted around the liver. No other fluid is
seen in the remaining quadrants of the abdomen.
IMPRESSION: Minimal ascites is noted around the liver.

## 2016-10-04 DIAGNOSIS — I5023 Acute on chronic systolic (congestive) heart failure: Secondary | ICD-10-CM | POA: Diagnosis not present

## 2016-10-04 DIAGNOSIS — I110 Hypertensive heart disease with heart failure: Secondary | ICD-10-CM | POA: Diagnosis not present

## 2016-10-04 DIAGNOSIS — Z7982 Long term (current) use of aspirin: Secondary | ICD-10-CM | POA: Diagnosis not present

## 2016-10-04 DIAGNOSIS — N1330 Unspecified hydronephrosis: Secondary | ICD-10-CM | POA: Diagnosis not present

## 2016-10-04 DIAGNOSIS — A0472 Enterocolitis due to Clostridium difficile, not specified as recurrent: Secondary | ICD-10-CM | POA: Diagnosis not present

## 2016-10-04 DIAGNOSIS — Z95828 Presence of other vascular implants and grafts: Secondary | ICD-10-CM | POA: Diagnosis not present

## 2016-10-04 DIAGNOSIS — Z9981 Dependence on supplemental oxygen: Secondary | ICD-10-CM | POA: Diagnosis not present

## 2016-10-04 DIAGNOSIS — I739 Peripheral vascular disease, unspecified: Secondary | ICD-10-CM | POA: Diagnosis not present

## 2016-10-04 DIAGNOSIS — Z936 Other artificial openings of urinary tract status: Secondary | ICD-10-CM | POA: Diagnosis not present

## 2016-10-04 DIAGNOSIS — D509 Iron deficiency anemia, unspecified: Secondary | ICD-10-CM | POA: Diagnosis not present

## 2016-10-04 DIAGNOSIS — J90 Pleural effusion, not elsewhere classified: Secondary | ICD-10-CM | POA: Diagnosis not present

## 2016-10-04 DIAGNOSIS — M48062 Spinal stenosis, lumbar region with neurogenic claudication: Secondary | ICD-10-CM | POA: Diagnosis not present

## 2016-10-04 DIAGNOSIS — F17210 Nicotine dependence, cigarettes, uncomplicated: Secondary | ICD-10-CM | POA: Diagnosis not present

## 2016-10-04 DIAGNOSIS — H3530 Unspecified macular degeneration: Secondary | ICD-10-CM | POA: Diagnosis not present

## 2016-10-04 DIAGNOSIS — Z89612 Acquired absence of left leg above knee: Secondary | ICD-10-CM | POA: Diagnosis not present

## 2016-10-04 DIAGNOSIS — G8929 Other chronic pain: Secondary | ICD-10-CM | POA: Diagnosis not present

## 2016-10-12 DIAGNOSIS — J90 Pleural effusion, not elsewhere classified: Secondary | ICD-10-CM | POA: Diagnosis not present

## 2016-10-12 DIAGNOSIS — M48062 Spinal stenosis, lumbar region with neurogenic claudication: Secondary | ICD-10-CM | POA: Diagnosis not present

## 2016-10-12 DIAGNOSIS — D509 Iron deficiency anemia, unspecified: Secondary | ICD-10-CM | POA: Diagnosis not present

## 2016-10-12 DIAGNOSIS — I5023 Acute on chronic systolic (congestive) heart failure: Secondary | ICD-10-CM | POA: Diagnosis not present

## 2016-10-12 DIAGNOSIS — A0472 Enterocolitis due to Clostridium difficile, not specified as recurrent: Secondary | ICD-10-CM | POA: Diagnosis not present

## 2016-10-12 DIAGNOSIS — I110 Hypertensive heart disease with heart failure: Secondary | ICD-10-CM | POA: Diagnosis not present

## 2016-10-18 DIAGNOSIS — E785 Hyperlipidemia, unspecified: Secondary | ICD-10-CM | POA: Diagnosis present

## 2016-10-18 DIAGNOSIS — Z9981 Dependence on supplemental oxygen: Secondary | ICD-10-CM | POA: Diagnosis not present

## 2016-10-18 DIAGNOSIS — K219 Gastro-esophageal reflux disease without esophagitis: Secondary | ICD-10-CM | POA: Diagnosis present

## 2016-10-18 DIAGNOSIS — Z1611 Resistance to penicillins: Secondary | ICD-10-CM | POA: Diagnosis present

## 2016-10-18 DIAGNOSIS — N200 Calculus of kidney: Secondary | ICD-10-CM | POA: Diagnosis not present

## 2016-10-18 DIAGNOSIS — I517 Cardiomegaly: Secondary | ICD-10-CM | POA: Diagnosis not present

## 2016-10-18 DIAGNOSIS — N390 Urinary tract infection, site not specified: Secondary | ICD-10-CM | POA: Diagnosis present

## 2016-10-18 DIAGNOSIS — I5023 Acute on chronic systolic (congestive) heart failure: Secondary | ICD-10-CM | POA: Diagnosis not present

## 2016-10-18 DIAGNOSIS — D72829 Elevated white blood cell count, unspecified: Secondary | ICD-10-CM | POA: Diagnosis not present

## 2016-10-18 DIAGNOSIS — R0602 Shortness of breath: Secondary | ICD-10-CM | POA: Diagnosis not present

## 2016-10-18 DIAGNOSIS — J9600 Acute respiratory failure, unspecified whether with hypoxia or hypercapnia: Secondary | ICD-10-CM | POA: Diagnosis not present

## 2016-10-18 DIAGNOSIS — N189 Chronic kidney disease, unspecified: Secondary | ICD-10-CM | POA: Diagnosis present

## 2016-10-18 DIAGNOSIS — G4700 Insomnia, unspecified: Secondary | ICD-10-CM | POA: Diagnosis present

## 2016-10-18 DIAGNOSIS — T83092A Other mechanical complication of nephrostomy catheter, initial encounter: Secondary | ICD-10-CM | POA: Diagnosis present

## 2016-10-18 DIAGNOSIS — Z89612 Acquired absence of left leg above knee: Secondary | ICD-10-CM | POA: Diagnosis not present

## 2016-10-18 DIAGNOSIS — T83022A Displacement of nephrostomy catheter, initial encounter: Secondary | ICD-10-CM | POA: Diagnosis not present

## 2016-10-18 DIAGNOSIS — I739 Peripheral vascular disease, unspecified: Secondary | ICD-10-CM | POA: Diagnosis present

## 2016-10-18 DIAGNOSIS — F17210 Nicotine dependence, cigarettes, uncomplicated: Secondary | ICD-10-CM | POA: Diagnosis not present

## 2016-10-18 DIAGNOSIS — A0472 Enterocolitis due to Clostridium difficile, not specified as recurrent: Secondary | ICD-10-CM | POA: Diagnosis not present

## 2016-10-18 DIAGNOSIS — J90 Pleural effusion, not elsewhere classified: Secondary | ICD-10-CM | POA: Diagnosis not present

## 2016-10-18 DIAGNOSIS — N184 Chronic kidney disease, stage 4 (severe): Secondary | ICD-10-CM | POA: Diagnosis not present

## 2016-10-18 DIAGNOSIS — N1330 Unspecified hydronephrosis: Secondary | ICD-10-CM | POA: Diagnosis not present

## 2016-10-18 DIAGNOSIS — A4159 Other Gram-negative sepsis: Secondary | ICD-10-CM | POA: Diagnosis not present

## 2016-10-18 DIAGNOSIS — I361 Nonrheumatic tricuspid (valve) insufficiency: Secondary | ICD-10-CM | POA: Diagnosis not present

## 2016-10-18 DIAGNOSIS — I130 Hypertensive heart and chronic kidney disease with heart failure and stage 1 through stage 4 chronic kidney disease, or unspecified chronic kidney disease: Secondary | ICD-10-CM | POA: Diagnosis not present

## 2016-10-18 DIAGNOSIS — R931 Abnormal findings on diagnostic imaging of heart and coronary circulation: Secondary | ICD-10-CM | POA: Diagnosis not present

## 2016-10-18 DIAGNOSIS — E876 Hypokalemia: Secondary | ICD-10-CM | POA: Diagnosis present

## 2016-10-18 DIAGNOSIS — J441 Chronic obstructive pulmonary disease with (acute) exacerbation: Secondary | ICD-10-CM | POA: Diagnosis present

## 2016-10-18 DIAGNOSIS — N179 Acute kidney failure, unspecified: Secondary | ICD-10-CM | POA: Diagnosis not present

## 2016-10-18 DIAGNOSIS — G8929 Other chronic pain: Secondary | ICD-10-CM | POA: Diagnosis present

## 2016-10-18 DIAGNOSIS — B9689 Other specified bacterial agents as the cause of diseases classified elsewhere: Secondary | ICD-10-CM | POA: Diagnosis present

## 2016-10-18 DIAGNOSIS — R918 Other nonspecific abnormal finding of lung field: Secondary | ICD-10-CM | POA: Diagnosis not present

## 2016-10-18 DIAGNOSIS — R188 Other ascites: Secondary | ICD-10-CM | POA: Diagnosis present

## 2016-10-18 DIAGNOSIS — J9621 Acute and chronic respiratory failure with hypoxia: Secondary | ICD-10-CM | POA: Diagnosis not present

## 2016-10-18 DIAGNOSIS — N132 Hydronephrosis with renal and ureteral calculous obstruction: Secondary | ICD-10-CM | POA: Diagnosis present

## 2016-10-18 DIAGNOSIS — R140 Abdominal distension (gaseous): Secondary | ICD-10-CM | POA: Diagnosis not present

## 2016-10-20 DIAGNOSIS — J90 Pleural effusion, not elsewhere classified: Secondary | ICD-10-CM | POA: Diagnosis not present

## 2016-10-24 DIAGNOSIS — I5023 Acute on chronic systolic (congestive) heart failure: Secondary | ICD-10-CM | POA: Diagnosis not present

## 2016-11-13 DIAGNOSIS — I110 Hypertensive heart disease with heart failure: Secondary | ICD-10-CM | POA: Diagnosis not present

## 2016-11-13 DIAGNOSIS — D509 Iron deficiency anemia, unspecified: Secondary | ICD-10-CM | POA: Diagnosis not present

## 2016-11-13 DIAGNOSIS — A0472 Enterocolitis due to Clostridium difficile, not specified as recurrent: Secondary | ICD-10-CM | POA: Diagnosis not present

## 2016-11-13 DIAGNOSIS — M48062 Spinal stenosis, lumbar region with neurogenic claudication: Secondary | ICD-10-CM | POA: Diagnosis not present

## 2016-11-13 DIAGNOSIS — I5023 Acute on chronic systolic (congestive) heart failure: Secondary | ICD-10-CM | POA: Diagnosis not present

## 2016-11-13 DIAGNOSIS — J90 Pleural effusion, not elsewhere classified: Secondary | ICD-10-CM | POA: Diagnosis not present

## 2016-11-23 DIAGNOSIS — R300 Dysuria: Secondary | ICD-10-CM | POA: Diagnosis not present

## 2016-11-23 DIAGNOSIS — F17210 Nicotine dependence, cigarettes, uncomplicated: Secondary | ICD-10-CM | POA: Diagnosis not present

## 2016-11-23 DIAGNOSIS — Z1159 Encounter for screening for other viral diseases: Secondary | ICD-10-CM | POA: Diagnosis not present

## 2016-11-23 DIAGNOSIS — R231 Pallor: Secondary | ICD-10-CM | POA: Diagnosis not present

## 2016-11-23 DIAGNOSIS — Z87442 Personal history of urinary calculi: Secondary | ICD-10-CM | POA: Diagnosis not present

## 2016-11-23 DIAGNOSIS — R7989 Other specified abnormal findings of blood chemistry: Secondary | ICD-10-CM | POA: Diagnosis not present

## 2016-11-23 DIAGNOSIS — R17 Unspecified jaundice: Secondary | ICD-10-CM | POA: Diagnosis not present

## 2016-11-23 DIAGNOSIS — R140 Abdominal distension (gaseous): Secondary | ICD-10-CM | POA: Diagnosis not present

## 2016-11-23 DIAGNOSIS — Z87440 Personal history of urinary (tract) infections: Secondary | ICD-10-CM | POA: Diagnosis not present

## 2016-11-23 DIAGNOSIS — D500 Iron deficiency anemia secondary to blood loss (chronic): Secondary | ICD-10-CM | POA: Diagnosis not present

## 2016-11-27 DIAGNOSIS — N10 Acute tubulo-interstitial nephritis: Secondary | ICD-10-CM | POA: Diagnosis not present

## 2016-11-27 DIAGNOSIS — A4151 Sepsis due to Escherichia coli [E. coli]: Secondary | ICD-10-CM | POA: Diagnosis not present

## 2016-11-27 DIAGNOSIS — J90 Pleural effusion, not elsewhere classified: Secondary | ICD-10-CM | POA: Diagnosis not present

## 2016-11-27 DIAGNOSIS — T83512A Infection and inflammatory reaction due to nephrostomy catheter, initial encounter: Secondary | ICD-10-CM | POA: Diagnosis not present

## 2016-11-27 DIAGNOSIS — N132 Hydronephrosis with renal and ureteral calculous obstruction: Secondary | ICD-10-CM | POA: Diagnosis not present

## 2016-11-27 DIAGNOSIS — N136 Pyonephrosis: Secondary | ICD-10-CM | POA: Diagnosis not present

## 2016-11-27 DIAGNOSIS — J9621 Acute and chronic respiratory failure with hypoxia: Secondary | ICD-10-CM | POA: Diagnosis not present

## 2016-11-27 DIAGNOSIS — N134 Hydroureter: Secondary | ICD-10-CM | POA: Diagnosis not present

## 2016-11-27 DIAGNOSIS — I214 Non-ST elevation (NSTEMI) myocardial infarction: Secondary | ICD-10-CM | POA: Diagnosis not present

## 2016-11-27 DIAGNOSIS — N1330 Unspecified hydronephrosis: Secondary | ICD-10-CM | POA: Diagnosis not present

## 2016-11-27 DIAGNOSIS — F17210 Nicotine dependence, cigarettes, uncomplicated: Secondary | ICD-10-CM | POA: Diagnosis not present

## 2016-11-27 DIAGNOSIS — N289 Disorder of kidney and ureter, unspecified: Secondary | ICD-10-CM | POA: Diagnosis not present

## 2016-11-27 DIAGNOSIS — I5023 Acute on chronic systolic (congestive) heart failure: Secondary | ICD-10-CM | POA: Diagnosis not present

## 2016-11-27 DIAGNOSIS — N200 Calculus of kidney: Secondary | ICD-10-CM | POA: Diagnosis not present

## 2016-11-28 DIAGNOSIS — J811 Chronic pulmonary edema: Secondary | ICD-10-CM | POA: Diagnosis not present

## 2016-11-28 DIAGNOSIS — I2510 Atherosclerotic heart disease of native coronary artery without angina pectoris: Secondary | ICD-10-CM | POA: Diagnosis present

## 2016-11-28 DIAGNOSIS — R7989 Other specified abnormal findings of blood chemistry: Secondary | ICD-10-CM | POA: Diagnosis not present

## 2016-11-28 DIAGNOSIS — T83098A Other mechanical complication of other indwelling urethral catheter, initial encounter: Secondary | ICD-10-CM | POA: Diagnosis not present

## 2016-11-28 DIAGNOSIS — I517 Cardiomegaly: Secondary | ICD-10-CM | POA: Diagnosis not present

## 2016-11-28 DIAGNOSIS — E876 Hypokalemia: Secondary | ICD-10-CM | POA: Diagnosis not present

## 2016-11-28 DIAGNOSIS — R935 Abnormal findings on diagnostic imaging of other abdominal regions, including retroperitoneum: Secondary | ICD-10-CM | POA: Diagnosis not present

## 2016-11-28 DIAGNOSIS — N12 Tubulo-interstitial nephritis, not specified as acute or chronic: Secondary | ICD-10-CM | POA: Diagnosis not present

## 2016-11-28 DIAGNOSIS — N132 Hydronephrosis with renal and ureteral calculous obstruction: Secondary | ICD-10-CM | POA: Diagnosis not present

## 2016-11-28 DIAGNOSIS — N136 Pyonephrosis: Secondary | ICD-10-CM | POA: Diagnosis present

## 2016-11-28 DIAGNOSIS — N179 Acute kidney failure, unspecified: Secondary | ICD-10-CM | POA: Diagnosis not present

## 2016-11-28 DIAGNOSIS — E785 Hyperlipidemia, unspecified: Secondary | ICD-10-CM | POA: Diagnosis present

## 2016-11-28 DIAGNOSIS — N200 Calculus of kidney: Secondary | ICD-10-CM | POA: Diagnosis not present

## 2016-11-28 DIAGNOSIS — R0600 Dyspnea, unspecified: Secondary | ICD-10-CM | POA: Diagnosis not present

## 2016-11-28 DIAGNOSIS — I5023 Acute on chronic systolic (congestive) heart failure: Secondary | ICD-10-CM | POA: Diagnosis not present

## 2016-11-28 DIAGNOSIS — Z7982 Long term (current) use of aspirin: Secondary | ICD-10-CM | POA: Diagnosis not present

## 2016-11-28 DIAGNOSIS — K219 Gastro-esophageal reflux disease without esophagitis: Secondary | ICD-10-CM | POA: Diagnosis present

## 2016-11-28 DIAGNOSIS — Z515 Encounter for palliative care: Secondary | ICD-10-CM | POA: Diagnosis not present

## 2016-11-28 DIAGNOSIS — F17210 Nicotine dependence, cigarettes, uncomplicated: Secondary | ICD-10-CM | POA: Diagnosis not present

## 2016-11-28 DIAGNOSIS — R918 Other nonspecific abnormal finding of lung field: Secondary | ICD-10-CM | POA: Diagnosis not present

## 2016-11-28 DIAGNOSIS — A4151 Sepsis due to Escherichia coli [E. coli]: Secondary | ICD-10-CM | POA: Diagnosis present

## 2016-11-28 DIAGNOSIS — N1330 Unspecified hydronephrosis: Secondary | ICD-10-CM | POA: Diagnosis not present

## 2016-11-28 DIAGNOSIS — Z452 Encounter for adjustment and management of vascular access device: Secondary | ICD-10-CM | POA: Diagnosis not present

## 2016-11-28 DIAGNOSIS — E871 Hypo-osmolality and hyponatremia: Secondary | ICD-10-CM | POA: Diagnosis not present

## 2016-11-28 DIAGNOSIS — D631 Anemia in chronic kidney disease: Secondary | ICD-10-CM | POA: Diagnosis present

## 2016-11-28 DIAGNOSIS — R188 Other ascites: Secondary | ICD-10-CM | POA: Diagnosis present

## 2016-11-28 DIAGNOSIS — Z89612 Acquired absence of left leg above knee: Secondary | ICD-10-CM | POA: Diagnosis not present

## 2016-11-28 DIAGNOSIS — J90 Pleural effusion, not elsewhere classified: Secondary | ICD-10-CM | POA: Diagnosis not present

## 2016-11-28 DIAGNOSIS — I214 Non-ST elevation (NSTEMI) myocardial infarction: Secondary | ICD-10-CM | POA: Diagnosis not present

## 2016-11-28 DIAGNOSIS — T83022A Displacement of nephrostomy catheter, initial encounter: Secondary | ICD-10-CM | POA: Diagnosis not present

## 2016-11-28 DIAGNOSIS — R609 Edema, unspecified: Secondary | ICD-10-CM | POA: Diagnosis not present

## 2016-11-28 DIAGNOSIS — Z7189 Other specified counseling: Secondary | ICD-10-CM | POA: Diagnosis not present

## 2016-11-28 DIAGNOSIS — R0602 Shortness of breath: Secondary | ICD-10-CM | POA: Diagnosis not present

## 2016-11-28 DIAGNOSIS — J431 Panlobular emphysema: Secondary | ICD-10-CM | POA: Diagnosis not present

## 2016-11-28 DIAGNOSIS — N134 Hydroureter: Secondary | ICD-10-CM | POA: Diagnosis not present

## 2016-11-28 DIAGNOSIS — F068 Other specified mental disorders due to known physiological condition: Secondary | ICD-10-CM | POA: Diagnosis not present

## 2016-11-28 DIAGNOSIS — J440 Chronic obstructive pulmonary disease with acute lower respiratory infection: Secondary | ICD-10-CM | POA: Diagnosis not present

## 2016-11-28 DIAGNOSIS — Z7401 Bed confinement status: Secondary | ICD-10-CM | POA: Diagnosis not present

## 2016-11-28 DIAGNOSIS — T83512A Infection and inflammatory reaction due to nephrostomy catheter, initial encounter: Secondary | ICD-10-CM | POA: Diagnosis present

## 2016-11-28 DIAGNOSIS — J9611 Chronic respiratory failure with hypoxia: Secondary | ICD-10-CM | POA: Diagnosis not present

## 2016-11-28 DIAGNOSIS — J9621 Acute and chronic respiratory failure with hypoxia: Secondary | ICD-10-CM | POA: Diagnosis not present

## 2016-11-28 DIAGNOSIS — N289 Disorder of kidney and ureter, unspecified: Secondary | ICD-10-CM | POA: Diagnosis not present

## 2016-11-28 DIAGNOSIS — I130 Hypertensive heart and chronic kidney disease with heart failure and stage 1 through stage 4 chronic kidney disease, or unspecified chronic kidney disease: Secondary | ICD-10-CM | POA: Diagnosis present

## 2016-11-28 DIAGNOSIS — Z7409 Other reduced mobility: Secondary | ICD-10-CM | POA: Diagnosis not present

## 2016-11-28 DIAGNOSIS — Z9981 Dependence on supplemental oxygen: Secondary | ICD-10-CM | POA: Diagnosis not present

## 2016-11-28 DIAGNOSIS — N183 Chronic kidney disease, stage 3 (moderate): Secondary | ICD-10-CM | POA: Diagnosis present

## 2016-11-28 DIAGNOSIS — I252 Old myocardial infarction: Secondary | ICD-10-CM | POA: Diagnosis not present

## 2016-11-28 DIAGNOSIS — T83012A Breakdown (mechanical) of nephrostomy catheter, initial encounter: Secondary | ICD-10-CM | POA: Diagnosis present

## 2016-11-28 DIAGNOSIS — G8929 Other chronic pain: Secondary | ICD-10-CM | POA: Diagnosis present

## 2016-11-28 DIAGNOSIS — C187 Malignant neoplasm of sigmoid colon: Secondary | ICD-10-CM | POA: Diagnosis present

## 2016-11-28 DIAGNOSIS — J441 Chronic obstructive pulmonary disease with (acute) exacerbation: Secondary | ICD-10-CM | POA: Diagnosis not present

## 2016-11-28 DIAGNOSIS — N10 Acute tubulo-interstitial nephritis: Secondary | ICD-10-CM | POA: Diagnosis not present

## 2016-11-28 DIAGNOSIS — I5021 Acute systolic (congestive) heart failure: Secondary | ICD-10-CM | POA: Diagnosis not present

## 2016-11-28 DIAGNOSIS — R109 Unspecified abdominal pain: Secondary | ICD-10-CM | POA: Diagnosis not present

## 2016-12-07 DIAGNOSIS — R109 Unspecified abdominal pain: Secondary | ICD-10-CM | POA: Diagnosis not present

## 2016-12-07 DIAGNOSIS — A4151 Sepsis due to Escherichia coli [E. coli]: Secondary | ICD-10-CM | POA: Diagnosis not present

## 2016-12-07 DIAGNOSIS — T83098A Other mechanical complication of other indwelling urethral catheter, initial encounter: Secondary | ICD-10-CM | POA: Diagnosis not present

## 2016-12-26 DIAGNOSIS — F17210 Nicotine dependence, cigarettes, uncomplicated: Secondary | ICD-10-CM | POA: Diagnosis not present

## 2016-12-26 DIAGNOSIS — I130 Hypertensive heart and chronic kidney disease with heart failure and stage 1 through stage 4 chronic kidney disease, or unspecified chronic kidney disease: Secondary | ICD-10-CM | POA: Diagnosis not present

## 2016-12-26 DIAGNOSIS — I214 Non-ST elevation (NSTEMI) myocardial infarction: Secondary | ICD-10-CM | POA: Diagnosis not present

## 2016-12-26 DIAGNOSIS — I5023 Acute on chronic systolic (congestive) heart failure: Secondary | ICD-10-CM | POA: Diagnosis not present

## 2016-12-26 DIAGNOSIS — Z9981 Dependence on supplemental oxygen: Secondary | ICD-10-CM | POA: Diagnosis not present

## 2016-12-26 DIAGNOSIS — I2510 Atherosclerotic heart disease of native coronary artery without angina pectoris: Secondary | ICD-10-CM | POA: Diagnosis not present

## 2016-12-26 DIAGNOSIS — J441 Chronic obstructive pulmonary disease with (acute) exacerbation: Secondary | ICD-10-CM | POA: Diagnosis not present

## 2016-12-26 DIAGNOSIS — Z89612 Acquired absence of left leg above knee: Secondary | ICD-10-CM | POA: Diagnosis not present

## 2016-12-26 DIAGNOSIS — N183 Chronic kidney disease, stage 3 (moderate): Secondary | ICD-10-CM | POA: Diagnosis not present

## 2016-12-31 DIAGNOSIS — Z9981 Dependence on supplemental oxygen: Secondary | ICD-10-CM | POA: Diagnosis not present

## 2016-12-31 DIAGNOSIS — J441 Chronic obstructive pulmonary disease with (acute) exacerbation: Secondary | ICD-10-CM | POA: Diagnosis not present

## 2016-12-31 DIAGNOSIS — F17210 Nicotine dependence, cigarettes, uncomplicated: Secondary | ICD-10-CM | POA: Diagnosis not present

## 2016-12-31 DIAGNOSIS — Z89612 Acquired absence of left leg above knee: Secondary | ICD-10-CM | POA: Diagnosis not present

## 2016-12-31 DIAGNOSIS — J9621 Acute and chronic respiratory failure with hypoxia: Secondary | ICD-10-CM | POA: Diagnosis not present

## 2016-12-31 DIAGNOSIS — K6389 Other specified diseases of intestine: Secondary | ICD-10-CM | POA: Diagnosis not present

## 2016-12-31 DIAGNOSIS — N183 Chronic kidney disease, stage 3 (moderate): Secondary | ICD-10-CM | POA: Diagnosis not present

## 2016-12-31 DIAGNOSIS — Z436 Encounter for attention to other artificial openings of urinary tract: Secondary | ICD-10-CM | POA: Diagnosis not present

## 2016-12-31 DIAGNOSIS — I5023 Acute on chronic systolic (congestive) heart failure: Secondary | ICD-10-CM | POA: Diagnosis not present

## 2016-12-31 DIAGNOSIS — N200 Calculus of kidney: Secondary | ICD-10-CM | POA: Diagnosis not present

## 2016-12-31 DIAGNOSIS — Z993 Dependence on wheelchair: Secondary | ICD-10-CM | POA: Diagnosis not present

## 2016-12-31 DIAGNOSIS — J90 Pleural effusion, not elsewhere classified: Secondary | ICD-10-CM | POA: Diagnosis not present

## 2016-12-31 DIAGNOSIS — D509 Iron deficiency anemia, unspecified: Secondary | ICD-10-CM | POA: Diagnosis not present

## 2016-12-31 DIAGNOSIS — I739 Peripheral vascular disease, unspecified: Secondary | ICD-10-CM | POA: Diagnosis not present

## 2017-01-01 DIAGNOSIS — J9621 Acute and chronic respiratory failure with hypoxia: Secondary | ICD-10-CM | POA: Diagnosis not present

## 2017-01-01 DIAGNOSIS — J90 Pleural effusion, not elsewhere classified: Secondary | ICD-10-CM | POA: Diagnosis not present

## 2017-01-01 DIAGNOSIS — I5023 Acute on chronic systolic (congestive) heart failure: Secondary | ICD-10-CM | POA: Diagnosis not present

## 2017-01-01 DIAGNOSIS — K6389 Other specified diseases of intestine: Secondary | ICD-10-CM | POA: Diagnosis not present

## 2017-01-01 DIAGNOSIS — D509 Iron deficiency anemia, unspecified: Secondary | ICD-10-CM | POA: Diagnosis not present

## 2017-01-01 DIAGNOSIS — J441 Chronic obstructive pulmonary disease with (acute) exacerbation: Secondary | ICD-10-CM | POA: Diagnosis not present

## 2017-01-04 DIAGNOSIS — I5023 Acute on chronic systolic (congestive) heart failure: Secondary | ICD-10-CM | POA: Diagnosis not present

## 2017-01-04 DIAGNOSIS — K6389 Other specified diseases of intestine: Secondary | ICD-10-CM | POA: Diagnosis not present

## 2017-01-04 DIAGNOSIS — J90 Pleural effusion, not elsewhere classified: Secondary | ICD-10-CM | POA: Diagnosis not present

## 2017-01-04 DIAGNOSIS — D509 Iron deficiency anemia, unspecified: Secondary | ICD-10-CM | POA: Diagnosis not present

## 2017-01-04 DIAGNOSIS — J9621 Acute and chronic respiratory failure with hypoxia: Secondary | ICD-10-CM | POA: Diagnosis not present

## 2017-01-04 DIAGNOSIS — J441 Chronic obstructive pulmonary disease with (acute) exacerbation: Secondary | ICD-10-CM | POA: Diagnosis not present

## 2017-01-05 DIAGNOSIS — D509 Iron deficiency anemia, unspecified: Secondary | ICD-10-CM | POA: Diagnosis not present

## 2017-01-05 DIAGNOSIS — J9621 Acute and chronic respiratory failure with hypoxia: Secondary | ICD-10-CM | POA: Diagnosis not present

## 2017-01-05 DIAGNOSIS — I5023 Acute on chronic systolic (congestive) heart failure: Secondary | ICD-10-CM | POA: Diagnosis not present

## 2017-01-05 DIAGNOSIS — J90 Pleural effusion, not elsewhere classified: Secondary | ICD-10-CM | POA: Diagnosis not present

## 2017-01-05 DIAGNOSIS — K6389 Other specified diseases of intestine: Secondary | ICD-10-CM | POA: Diagnosis not present

## 2017-01-05 DIAGNOSIS — J441 Chronic obstructive pulmonary disease with (acute) exacerbation: Secondary | ICD-10-CM | POA: Diagnosis not present

## 2017-01-08 DIAGNOSIS — J441 Chronic obstructive pulmonary disease with (acute) exacerbation: Secondary | ICD-10-CM | POA: Diagnosis not present

## 2017-01-08 DIAGNOSIS — J90 Pleural effusion, not elsewhere classified: Secondary | ICD-10-CM | POA: Diagnosis not present

## 2017-01-08 DIAGNOSIS — D509 Iron deficiency anemia, unspecified: Secondary | ICD-10-CM | POA: Diagnosis not present

## 2017-01-08 DIAGNOSIS — J9621 Acute and chronic respiratory failure with hypoxia: Secondary | ICD-10-CM | POA: Diagnosis not present

## 2017-01-08 DIAGNOSIS — K6389 Other specified diseases of intestine: Secondary | ICD-10-CM | POA: Diagnosis not present

## 2017-01-08 DIAGNOSIS — I5023 Acute on chronic systolic (congestive) heart failure: Secondary | ICD-10-CM | POA: Diagnosis not present

## 2017-01-09 DIAGNOSIS — D509 Iron deficiency anemia, unspecified: Secondary | ICD-10-CM | POA: Diagnosis not present

## 2017-01-09 DIAGNOSIS — J90 Pleural effusion, not elsewhere classified: Secondary | ICD-10-CM | POA: Diagnosis not present

## 2017-01-09 DIAGNOSIS — Z436 Encounter for attention to other artificial openings of urinary tract: Secondary | ICD-10-CM | POA: Diagnosis not present

## 2017-01-09 DIAGNOSIS — I5023 Acute on chronic systolic (congestive) heart failure: Secondary | ICD-10-CM | POA: Diagnosis not present

## 2017-01-09 DIAGNOSIS — Z993 Dependence on wheelchair: Secondary | ICD-10-CM | POA: Diagnosis not present

## 2017-01-09 DIAGNOSIS — J441 Chronic obstructive pulmonary disease with (acute) exacerbation: Secondary | ICD-10-CM | POA: Diagnosis not present

## 2017-01-09 DIAGNOSIS — Z89612 Acquired absence of left leg above knee: Secondary | ICD-10-CM | POA: Diagnosis not present

## 2017-01-09 DIAGNOSIS — Z9981 Dependence on supplemental oxygen: Secondary | ICD-10-CM | POA: Diagnosis not present

## 2017-01-09 DIAGNOSIS — J9621 Acute and chronic respiratory failure with hypoxia: Secondary | ICD-10-CM | POA: Diagnosis not present

## 2017-01-09 DIAGNOSIS — N183 Chronic kidney disease, stage 3 (moderate): Secondary | ICD-10-CM | POA: Diagnosis not present

## 2017-01-09 DIAGNOSIS — F17210 Nicotine dependence, cigarettes, uncomplicated: Secondary | ICD-10-CM | POA: Diagnosis not present

## 2017-01-09 DIAGNOSIS — I739 Peripheral vascular disease, unspecified: Secondary | ICD-10-CM | POA: Diagnosis not present

## 2017-01-09 DIAGNOSIS — N200 Calculus of kidney: Secondary | ICD-10-CM | POA: Diagnosis not present

## 2017-01-09 DIAGNOSIS — K6389 Other specified diseases of intestine: Secondary | ICD-10-CM | POA: Diagnosis not present

## 2017-01-12 DIAGNOSIS — D509 Iron deficiency anemia, unspecified: Secondary | ICD-10-CM | POA: Diagnosis not present

## 2017-01-12 DIAGNOSIS — J441 Chronic obstructive pulmonary disease with (acute) exacerbation: Secondary | ICD-10-CM | POA: Diagnosis not present

## 2017-01-12 DIAGNOSIS — I5023 Acute on chronic systolic (congestive) heart failure: Secondary | ICD-10-CM | POA: Diagnosis not present

## 2017-01-12 DIAGNOSIS — J90 Pleural effusion, not elsewhere classified: Secondary | ICD-10-CM | POA: Diagnosis not present

## 2017-01-12 DIAGNOSIS — K6389 Other specified diseases of intestine: Secondary | ICD-10-CM | POA: Diagnosis not present

## 2017-01-12 DIAGNOSIS — J9621 Acute and chronic respiratory failure with hypoxia: Secondary | ICD-10-CM | POA: Diagnosis not present

## 2017-01-15 DIAGNOSIS — K6389 Other specified diseases of intestine: Secondary | ICD-10-CM | POA: Diagnosis not present

## 2017-01-15 DIAGNOSIS — J90 Pleural effusion, not elsewhere classified: Secondary | ICD-10-CM | POA: Diagnosis not present

## 2017-01-15 DIAGNOSIS — J9621 Acute and chronic respiratory failure with hypoxia: Secondary | ICD-10-CM | POA: Diagnosis not present

## 2017-01-15 DIAGNOSIS — J441 Chronic obstructive pulmonary disease with (acute) exacerbation: Secondary | ICD-10-CM | POA: Diagnosis not present

## 2017-01-15 DIAGNOSIS — D509 Iron deficiency anemia, unspecified: Secondary | ICD-10-CM | POA: Diagnosis not present

## 2017-01-15 DIAGNOSIS — I5023 Acute on chronic systolic (congestive) heart failure: Secondary | ICD-10-CM | POA: Diagnosis not present

## 2017-01-17 DIAGNOSIS — J9621 Acute and chronic respiratory failure with hypoxia: Secondary | ICD-10-CM | POA: Diagnosis not present

## 2017-01-17 DIAGNOSIS — K6389 Other specified diseases of intestine: Secondary | ICD-10-CM | POA: Diagnosis not present

## 2017-01-17 DIAGNOSIS — D509 Iron deficiency anemia, unspecified: Secondary | ICD-10-CM | POA: Diagnosis not present

## 2017-01-17 DIAGNOSIS — J441 Chronic obstructive pulmonary disease with (acute) exacerbation: Secondary | ICD-10-CM | POA: Diagnosis not present

## 2017-01-17 DIAGNOSIS — I5023 Acute on chronic systolic (congestive) heart failure: Secondary | ICD-10-CM | POA: Diagnosis not present

## 2017-01-17 DIAGNOSIS — J90 Pleural effusion, not elsewhere classified: Secondary | ICD-10-CM | POA: Diagnosis not present

## 2017-01-18 DIAGNOSIS — I5023 Acute on chronic systolic (congestive) heart failure: Secondary | ICD-10-CM | POA: Diagnosis not present

## 2017-01-18 DIAGNOSIS — J90 Pleural effusion, not elsewhere classified: Secondary | ICD-10-CM | POA: Diagnosis not present

## 2017-01-18 DIAGNOSIS — J9621 Acute and chronic respiratory failure with hypoxia: Secondary | ICD-10-CM | POA: Diagnosis not present

## 2017-01-18 DIAGNOSIS — D509 Iron deficiency anemia, unspecified: Secondary | ICD-10-CM | POA: Diagnosis not present

## 2017-01-18 DIAGNOSIS — J441 Chronic obstructive pulmonary disease with (acute) exacerbation: Secondary | ICD-10-CM | POA: Diagnosis not present

## 2017-01-18 DIAGNOSIS — K6389 Other specified diseases of intestine: Secondary | ICD-10-CM | POA: Diagnosis not present

## 2017-01-23 DIAGNOSIS — K6389 Other specified diseases of intestine: Secondary | ICD-10-CM | POA: Diagnosis not present

## 2017-01-23 DIAGNOSIS — J90 Pleural effusion, not elsewhere classified: Secondary | ICD-10-CM | POA: Diagnosis not present

## 2017-01-23 DIAGNOSIS — J441 Chronic obstructive pulmonary disease with (acute) exacerbation: Secondary | ICD-10-CM | POA: Diagnosis not present

## 2017-01-23 DIAGNOSIS — J9621 Acute and chronic respiratory failure with hypoxia: Secondary | ICD-10-CM | POA: Diagnosis not present

## 2017-01-23 DIAGNOSIS — I5023 Acute on chronic systolic (congestive) heart failure: Secondary | ICD-10-CM | POA: Diagnosis not present

## 2017-01-23 DIAGNOSIS — D509 Iron deficiency anemia, unspecified: Secondary | ICD-10-CM | POA: Diagnosis not present

## 2017-01-28 ENCOUNTER — Emergency Department: Payer: Medicare Other

## 2017-01-28 ENCOUNTER — Inpatient Hospital Stay
Admission: EM | Admit: 2017-01-28 | Discharge: 2017-01-30 | DRG: 291 | Disposition: A | Discharge status: Home / Self Care | Payer: Medicare Other | Attending: Internal Medicine | Admitting: Internal Medicine

## 2017-01-28 DIAGNOSIS — Z79899 Other long term (current) drug therapy: Secondary | ICD-10-CM | POA: Diagnosis not present

## 2017-01-28 DIAGNOSIS — Z9911 Dependence on respirator [ventilator] status: Secondary | ICD-10-CM | POA: Diagnosis not present

## 2017-01-28 DIAGNOSIS — F419 Anxiety disorder, unspecified: Secondary | ICD-10-CM | POA: Diagnosis present

## 2017-01-28 DIAGNOSIS — H3530 Unspecified macular degeneration: Secondary | ICD-10-CM | POA: Diagnosis present

## 2017-01-28 DIAGNOSIS — I739 Peripheral vascular disease, unspecified: Secondary | ICD-10-CM | POA: Diagnosis present

## 2017-01-28 DIAGNOSIS — I5023 Acute on chronic systolic (congestive) heart failure: Secondary | ICD-10-CM | POA: Diagnosis present

## 2017-01-28 DIAGNOSIS — J189 Pneumonia, unspecified organism: Secondary | ICD-10-CM | POA: Diagnosis present

## 2017-01-28 DIAGNOSIS — Y95 Nosocomial condition: Secondary | ICD-10-CM | POA: Diagnosis present

## 2017-01-28 DIAGNOSIS — J440 Chronic obstructive pulmonary disease with acute lower respiratory infection: Secondary | ICD-10-CM | POA: Diagnosis present

## 2017-01-28 DIAGNOSIS — J9601 Acute respiratory failure with hypoxia: Secondary | ICD-10-CM | POA: Diagnosis not present

## 2017-01-28 DIAGNOSIS — I509 Heart failure, unspecified: Secondary | ICD-10-CM

## 2017-01-28 DIAGNOSIS — R918 Other nonspecific abnormal finding of lung field: Secondary | ICD-10-CM | POA: Diagnosis not present

## 2017-01-28 DIAGNOSIS — J441 Chronic obstructive pulmonary disease with (acute) exacerbation: Secondary | ICD-10-CM | POA: Diagnosis present

## 2017-01-28 DIAGNOSIS — Z9981 Dependence on supplemental oxygen: Secondary | ICD-10-CM | POA: Diagnosis not present

## 2017-01-28 DIAGNOSIS — F17210 Nicotine dependence, cigarettes, uncomplicated: Secondary | ICD-10-CM | POA: Diagnosis present

## 2017-01-28 DIAGNOSIS — I5021 Acute systolic (congestive) heart failure: Secondary | ICD-10-CM | POA: Diagnosis not present

## 2017-01-28 DIAGNOSIS — R0602 Shortness of breath: Secondary | ICD-10-CM | POA: Diagnosis not present

## 2017-01-28 DIAGNOSIS — E785 Hyperlipidemia, unspecified: Secondary | ICD-10-CM | POA: Diagnosis present

## 2017-01-28 DIAGNOSIS — K219 Gastro-esophageal reflux disease without esophagitis: Secondary | ICD-10-CM | POA: Diagnosis present

## 2017-01-28 DIAGNOSIS — J9600 Acute respiratory failure, unspecified whether with hypoxia or hypercapnia: Secondary | ICD-10-CM | POA: Diagnosis not present

## 2017-01-28 DIAGNOSIS — I110 Hypertensive heart disease with heart failure: Secondary | ICD-10-CM | POA: Diagnosis not present

## 2017-01-28 DIAGNOSIS — J9811 Atelectasis: Secondary | ICD-10-CM | POA: Diagnosis present

## 2017-01-28 DIAGNOSIS — Z89612 Acquired absence of left leg above knee: Secondary | ICD-10-CM | POA: Diagnosis not present

## 2017-01-28 DIAGNOSIS — J9621 Acute and chronic respiratory failure with hypoxia: Secondary | ICD-10-CM | POA: Diagnosis present

## 2017-01-28 DIAGNOSIS — F329 Major depressive disorder, single episode, unspecified: Secondary | ICD-10-CM | POA: Diagnosis present

## 2017-01-28 DIAGNOSIS — J449 Chronic obstructive pulmonary disease, unspecified: Secondary | ICD-10-CM | POA: Diagnosis not present

## 2017-01-28 DIAGNOSIS — R0600 Dyspnea, unspecified: Secondary | ICD-10-CM | POA: Diagnosis not present

## 2017-01-28 DIAGNOSIS — J9690 Respiratory failure, unspecified, unspecified whether with hypoxia or hypercapnia: Secondary | ICD-10-CM | POA: Diagnosis not present

## 2017-01-28 LAB — BASIC METABOLIC PANEL
ANION GAP: 9 (ref 5–15)
BUN: 50 mg/dL — ABNORMAL HIGH (ref 6–20)
CHLORIDE: 102 mmol/L (ref 101–111)
CO2: 25 mmol/L (ref 22–32)
Calcium: 8.7 mg/dL — ABNORMAL LOW (ref 8.9–10.3)
Creatinine, Ser: 1.58 mg/dL — ABNORMAL HIGH (ref 0.61–1.24)
GFR calc non Af Amer: 41 mL/min — ABNORMAL LOW (ref >60)
GFR, EST AFRICAN AMERICAN: 48 mL/min — AB (ref >60)
Glucose, Bld: 111 mg/dL — ABNORMAL HIGH (ref 65–99)
POTASSIUM: 4.6 mmol/L (ref 3.5–5.1)
SODIUM: 136 mmol/L (ref 135–145)

## 2017-01-28 LAB — CBC WITH DIFFERENTIAL/PLATELET
BASOS PCT: 1 %
Basophils Absolute: 0.1 10*3/uL (ref 0–0.1)
Eosinophils Absolute: 0.3 10*3/uL (ref 0–0.7)
Eosinophils Relative: 3 %
HEMATOCRIT: 31.9 % — AB (ref 40.0–52.0)
HEMOGLOBIN: 9.8 g/dL — AB (ref 13.0–18.0)
Lymphocytes Relative: 7 %
Lymphs Abs: 0.9 10*3/uL — ABNORMAL LOW (ref 1.0–3.6)
MCH: 21.9 pg — ABNORMAL LOW (ref 26.0–34.0)
MCHC: 30.8 g/dL — ABNORMAL LOW (ref 32.0–36.0)
MCV: 71.2 fL — ABNORMAL LOW (ref 80.0–100.0)
MONOS PCT: 6 %
Monocytes Absolute: 0.7 10*3/uL (ref 0.2–1.0)
NEUTROS ABS: 10.8 10*3/uL — AB (ref 1.4–6.5)
NEUTROS PCT: 83 %
Platelets: 325 10*3/uL (ref 150–440)
RBC: 4.48 MIL/uL (ref 4.40–5.90)
RDW: 25.9 % — ABNORMAL HIGH (ref 11.5–14.5)
WBC: 12.9 10*3/uL — AB (ref 3.8–10.6)

## 2017-01-28 LAB — LACTIC ACID, PLASMA: Lactic Acid, Venous: 1.3 mmol/L (ref 0.5–1.9)

## 2017-01-28 LAB — TROPONIN I
TROPONIN I: 0.03 ng/mL — AB (ref <0.03)
Troponin I: 0.03 ng/mL (ref <0.03)

## 2017-01-28 LAB — BRAIN NATRIURETIC PEPTIDE

## 2017-01-28 MED ORDER — ACETAMINOPHEN 650 MG RE SUPP: 650.0000 mg | Freq: Four times a day (QID) | RECTAL | Status: DC | PRN

## 2017-01-28 MED ORDER — SODIUM CHLORIDE 0.9% FLUSH
3.0000 mL | INTRAVENOUS | Status: DC | PRN
Start: 2017-01-28 — End: 2017-01-30

## 2017-01-28 MED ORDER — VANCOMYCIN HCL IN DEXTROSE 1-5 GM/200ML-% IV SOLN
1000.0000 mg | Freq: Once | INTRAVENOUS | Status: AC
  Administered 2017-01-28: 1000 mg via INTRAVENOUS
  Filled 2017-01-28: qty 200

## 2017-01-28 MED ORDER — PROCHLORPERAZINE MALEATE 10 MG PO TABS
10.0000 mg | ORAL_TABLET | Freq: Four times a day (QID) | ORAL | Status: DC | PRN
  Filled 2017-01-28: qty 1

## 2017-01-28 MED ORDER — NITROGLYCERIN 0.4 MG SL SUBL: 0.4000 mg | SUBLINGUAL_TABLET | SUBLINGUAL | Status: DC | PRN

## 2017-01-28 MED ORDER — SPIRONOLACTONE 25 MG PO TABS
25.0000 mg | ORAL_TABLET | Freq: Every day | ORAL | Status: DC
  Administered 2017-01-29 – 2017-01-30 (×2): 25 mg via ORAL
  Filled 2017-01-28 (×2): qty 1

## 2017-01-28 MED ORDER — ACETAMINOPHEN 325 MG PO TABS
650.0000 mg | ORAL_TABLET | Freq: Four times a day (QID) | ORAL | Status: DC | PRN
  Administered 2017-01-29: 650 mg via ORAL
  Filled 2017-01-28: qty 2

## 2017-01-28 MED ORDER — FUROSEMIDE 10 MG/ML IJ SOLN
80.0000 mg | Freq: Once | INTRAMUSCULAR | Status: AC
  Administered 2017-01-28: 80 mg via INTRAVENOUS
  Filled 2017-01-28: qty 8

## 2017-01-28 MED ORDER — LOSARTAN POTASSIUM 25 MG PO TABS
25.0000 mg | ORAL_TABLET | Freq: Every day | ORAL | Status: DC
  Administered 2017-01-29 – 2017-01-30 (×2): 25 mg via ORAL
  Filled 2017-01-28 (×2): qty 1

## 2017-01-28 MED ORDER — PANTOPRAZOLE SODIUM 40 MG PO TBEC
40.0000 mg | DELAYED_RELEASE_TABLET | Freq: Every day | ORAL | Status: DC
  Administered 2017-01-29 – 2017-01-30 (×2): 40 mg via ORAL
  Filled 2017-01-28 (×2): qty 1

## 2017-01-28 MED ORDER — ENOXAPARIN SODIUM 40 MG/0.4ML ~~LOC~~ SOLN
40.0000 mg | SUBCUTANEOUS | Status: DC
  Filled 2017-01-28: qty 0.4

## 2017-01-28 MED ORDER — OXYCODONE-ACETAMINOPHEN 5-325 MG PO TABS
1.0000 | ORAL_TABLET | Freq: Once | ORAL | Status: AC
  Administered 2017-01-28: 1 via ORAL

## 2017-01-28 MED ORDER — SODIUM CHLORIDE 0.9% FLUSH
3.0000 mL | Freq: Two times a day (BID) | INTRAVENOUS | Status: DC
  Administered 2017-01-28 – 2017-01-30 (×4): 3 mL via INTRAVENOUS

## 2017-01-28 MED ORDER — HYDROCODONE-ACETAMINOPHEN 10-325 MG PO TABS
1.0000 | ORAL_TABLET | Freq: Three times a day (TID) | ORAL | Status: DC | PRN
  Administered 2017-01-29: 1 via ORAL
  Filled 2017-01-28 (×2): qty 1

## 2017-01-28 MED ORDER — LEVOFLOXACIN IN D5W 500 MG/100ML IV SOLN
500.0000 mg | INTRAVENOUS | Status: DC
  Filled 2017-01-28: qty 100

## 2017-01-28 MED ORDER — BENZONATATE 100 MG PO CAPS
100.0000 mg | ORAL_CAPSULE | Freq: Three times a day (TID) | ORAL | Status: DC
  Administered 2017-01-28 – 2017-01-30 (×6): 100 mg via ORAL
  Filled 2017-01-28 (×6): qty 1

## 2017-01-28 MED ORDER — SODIUM CHLORIDE 0.9 % IV SOLN
250.0000 mL | INTRAVENOUS | Status: DC | PRN
Start: 2017-01-28 — End: 2017-01-30

## 2017-01-28 MED ORDER — GUAIFENESIN ER 600 MG PO TB12: 600.0000 mg | ORAL_TABLET | Freq: Two times a day (BID) | ORAL | Status: DC

## 2017-01-28 MED ORDER — OXYCODONE HCL 5 MG PO TABS
10.0000 mg | ORAL_TABLET | ORAL | Status: DC | PRN
  Administered 2017-01-28 – 2017-01-29 (×3): 10 mg via ORAL
  Filled 2017-01-28 (×3): qty 2

## 2017-01-28 MED ORDER — AZITHROMYCIN 500 MG PO TABS
500.0000 mg | ORAL_TABLET | Freq: Once | ORAL | Status: AC
  Administered 2017-01-28: 500 mg via ORAL
  Filled 2017-01-28 (×2): qty 1

## 2017-01-28 MED ORDER — ONDANSETRON HCL 4 MG/2ML IJ SOLN
4.0000 mg | Freq: Four times a day (QID) | INTRAMUSCULAR | Status: DC | PRN
  Administered 2017-01-30: 4 mg via INTRAVENOUS
  Filled 2017-01-28: qty 2

## 2017-01-28 MED ORDER — BISOPROLOL FUMARATE 5 MG PO TABS
5.0000 mg | ORAL_TABLET | Freq: Every day | ORAL | Status: DC
  Administered 2017-01-29 – 2017-01-30 (×2): 5 mg via ORAL
  Filled 2017-01-28 (×2): qty 1

## 2017-01-28 MED ORDER — TETRAHYDROZOLINE HCL 0.05 % OP SOLN
2.0000 [drp] | Freq: Two times a day (BID) | OPHTHALMIC | Status: DC | PRN
  Filled 2017-01-28: qty 15

## 2017-01-28 MED ORDER — METHYLPREDNISOLONE SODIUM SUCC 125 MG IJ SOLR
60.0000 mg | Freq: Four times a day (QID) | INTRAMUSCULAR | Status: DC
  Administered 2017-01-28 – 2017-01-29 (×2): 60 mg via INTRAVENOUS
  Filled 2017-01-28 (×2): qty 2

## 2017-01-28 MED ORDER — VITAMIN B-12 1000 MCG PO TABS
1000.0000 ug | ORAL_TABLET | Freq: Every day | ORAL | Status: DC
  Administered 2017-01-29 – 2017-01-30 (×2): 1000 ug via ORAL
  Filled 2017-01-28 (×2): qty 1

## 2017-01-28 MED ORDER — GUAIFENESIN ER 600 MG PO TB12
600.0000 mg | ORAL_TABLET | Freq: Two times a day (BID) | ORAL | Status: DC
  Administered 2017-01-28 – 2017-01-30 (×4): 600 mg via ORAL
  Filled 2017-01-28 (×4): qty 1

## 2017-01-28 MED ORDER — TIOTROPIUM BROMIDE MONOHYDRATE 18 MCG IN CAPS
18.0000 ug | ORAL_CAPSULE | Freq: Every day | RESPIRATORY_TRACT | Status: DC
  Administered 2017-01-29 – 2017-01-30 (×2): 18 ug via RESPIRATORY_TRACT
  Filled 2017-01-28: qty 5

## 2017-01-28 MED ORDER — HYDROMORPHONE HCL 1 MG/ML PO LIQD: 1.0000 mg | ORAL | Status: DC | PRN

## 2017-01-28 MED ORDER — METOLAZONE 2.5 MG PO TABS
2.5000 mg | ORAL_TABLET | ORAL | Status: DC
  Administered 2017-01-29: 2.5 mg via ORAL
  Filled 2017-01-28: qty 1

## 2017-01-28 MED ORDER — IPRATROPIUM-ALBUTEROL 0.5-2.5 (3) MG/3ML IN SOLN
3.0000 mL | Freq: Four times a day (QID) | RESPIRATORY_TRACT | Status: DC
  Administered 2017-01-29 (×2): 3 mL via RESPIRATORY_TRACT
  Filled 2017-01-28 (×4): qty 3

## 2017-01-28 MED ORDER — CEFEPIME-DEXTROSE 1 GM/50ML IV SOLR
1.0000 g | Freq: Once | INTRAVENOUS | Status: AC
  Administered 2017-01-28: 1 g via INTRAVENOUS
  Filled 2017-01-28: qty 50

## 2017-01-28 MED ORDER — ALBUTEROL SULFATE (2.5 MG/3ML) 0.083% IN NEBU
3.0000 mL | INHALATION_SOLUTION | RESPIRATORY_TRACT | Status: DC | PRN
  Filled 2017-01-28: qty 3

## 2017-01-28 MED ORDER — LORAZEPAM 0.5 MG PO TABS
0.5000 mg | ORAL_TABLET | Freq: Three times a day (TID) | ORAL | Status: DC
  Administered 2017-01-28 – 2017-01-30 (×5): 0.5 mg via ORAL
  Filled 2017-01-28 (×8): qty 1

## 2017-01-28 MED ORDER — OXYCODONE-ACETAMINOPHEN 5-325 MG PO TABS
ORAL_TABLET | ORAL | Status: AC
  Administered 2017-01-28: 1 via ORAL
  Filled 2017-01-28: qty 1

## 2017-01-28 MED ORDER — ACETAMINOPHEN 500 MG PO TABS
1000.0000 mg | ORAL_TABLET | Freq: Three times a day (TID) | ORAL | Status: DC
  Administered 2017-01-28: 500 mg via ORAL
  Administered 2017-01-29 – 2017-01-30 (×3): 1000 mg via ORAL
  Filled 2017-01-28 (×5): qty 2
  Filled 2017-01-28: qty 1

## 2017-01-28 MED ORDER — ONDANSETRON HCL 4 MG PO TABS: 4.0000 mg | ORAL_TABLET | Freq: Four times a day (QID) | ORAL | Status: DC | PRN

## 2017-01-28 MED ORDER — DIPHENHYDRAMINE HCL 25 MG PO CAPS: 25.0000 mg | ORAL_CAPSULE | Freq: Three times a day (TID) | ORAL | Status: DC | PRN

## 2017-01-28 MED ORDER — FUROSEMIDE 10 MG/ML IJ SOLN
60.0000 mg | Freq: Two times a day (BID) | INTRAMUSCULAR | Status: DC
  Administered 2017-01-29: 60 mg via INTRAVENOUS
  Filled 2017-01-28: qty 6

## 2017-01-28 MED ORDER — ALPRAZOLAM 0.25 MG PO TABS
0.2500 mg | ORAL_TABLET | Freq: Two times a day (BID) | ORAL | Status: DC | PRN
  Administered 2017-01-29: 0.25 mg via ORAL
  Filled 2017-01-28: qty 1

## 2017-01-28 NOTE — ED Provider Notes (Signed)
[facility] Emergency Department Provider Note  ____________________________________________  Time seen: Approximately 6:37 PM  I have reviewed the triage vital signs and the nursing notes.   HISTORY  Chief Complaint Shortness of Breath   HPI Collin [name] is a 76 y.o. male with h/o CHF (EF 10-15%), COPD on 3L [place], HTN, HLD, recurrent R sided pleural effusions who presents for evaluation of SOB. Patient reports 3 days of worsening SOB. Symptoms are now severe and constant. He has been taking 40 mg of lasix BID and has been retaining fluid. He does not weigh himself regularly. He has a cough productive of brown sputum. No fever or chills. Needed to increase his oxygen to 5 L last night. No CP, abdominal pain, N/V/D. When EMS arrived patient was sating 78%, received one albuterol treatment and was placed on NRB.   Past Medical History:  Diagnosis Date  . Anemia   . Anxiety   . Arthritis   . Asthma   . Chronic systolic CHF (congestive heart failure) ([place])   . COPD (chronic obstructive pulmonary disease) ([place])   . GERD (gastroesophageal reflux disease)   . History of home oxygen therapy   . HLD (hyperlipidemia)   . HTN (hypertension)   . Hx of nephrostomy ([employer])   . Hypertensive retinopathy   . Kidney stone   . Macular degeneration   . Peripheral vascular disease of lower extremity ([place])   . Pleural effusion   . Spinal stenosis of lumbar region     Patient Active Problem List   Diagnosis Date Noted  . Lumbar canal stenosis 12/03/2015  . COPD exacerbation ([place]) 11/29/2015  . Tobacco use 11/29/2015  . Sepsis ([place]) 10/26/2015  . Chronic respiratory failure with hypoxia ([place]) 10/26/2015  . Gangrenous toe ([place]) 10/26/2015  . UTI (lower urinary tract infection)   . Hydronephrosis, left   . Acute on chronic respiratory failure ([name]) 06/12/2015  . Acute on chronic systolic CHF (congestive heart failure) ([place]) 03/19/2015  . Open wound of left heel  03/19/2015  . Open wnd of foot 03/19/2015  . Acute on chronic systolic heart failure ([place]) 03/19/2015  . Pleural effusion on right 03/04/2015  . COPD (chronic obstructive pulmonary disease) ([place]) 03/04/2015  . HLD (hyperlipidemia) 03/04/2015  . Chronic systolic CHF (congestive heart failure) ([place]) 03/04/2015  . HTN (hypertension) 03/04/2015  . GERD (gastroesophageal reflux disease) 03/04/2015  . Loculated pleural effusion 03/04/2015  . Compulsive tobacco user syndrome 01/07/2015  . Lumbar radiculopathy 06/12/2014  . Neuritis or radiculitis due to rupture of lumbar intervertebral disc 06/12/2014  . Calculus of kidney 12/09/2013  . Lower urinary tract symptoms 12/09/2013  . Benign prostatic hyperplasia with urinary obstruction 12/09/2013  . Back pain, chronic 04/04/2013  . Age-related macular degeneration 03/11/2012  . 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 Cataract, nuclear sclerotic senile 11/10/2011  . Nonexudative age-related macular degeneration 11/10/2011  . Hypertensive retinopathy 11/10/2011  . 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 Cataract cortical, senile 11/10/2011  . Loss of teeth due to gum disease 05/22/2011  . Complete loss of teeth due to periodontal disease 05/22/2011    Past Surgical History:  Procedure Laterality Date  . ABDOMINAL AORTIC ANEURYSM REPAIR    . CHEST TUBE INSERTION Right 07/07/2015   Procedure: INSERTION PLEURAL DRAINAGE CATHETER;  Surgeon: Nestor Lewandowsky, MD;  Location: [facility] ORS;  Service: Thoracic;  Laterality: Right;  . HERNIA REPAIR     Umbilical  . IR GENERIC HISTORICAL  06/21/2016   IR NEPHROSTOMY EXCHANGE RIGHT 06/21/2016 Sandi Mariscal, MD [facility]-INTERV RAD  . KIDNEY STONE SURGERY    . LEG AMPUTATION Left    above knee amputation  . NEPHROSTOMY TUBE PLACEMENT ([place] HX)    . PERIPHERAL VASCULAR CATHETERIZATION N/A 11/03/2015   Procedure: Abdominal Aortogram w/Lower Extremity;  Surgeon: Algernon Huxley, MD;  Location: [place] CV LAB;  Service: Cardiovascular;  Laterality: N/A;  . PERIPHERAL VASCULAR CATHETERIZATION  11/03/2015   Procedure: Lower Extremity Intervention;  Surgeon: Algernon Huxley, MD;  Location: [name] CV LAB;  Service: Cardiovascular;;  . PERIPHERAL VASCULAR CATHETERIZATION Left 11/10/2015   Procedure: Lower Extremity Angiography;  Surgeon: Algernon Huxley, MD;  Location: [place] CV LAB;  Service: Cardiovascular;  Laterality: Left;  . PERIPHERAL VASCULAR CATHETERIZATION  11/10/2015   Procedure: Lower Extremity Intervention;  Surgeon: Algernon Huxley, MD;  Location: [place] CV LAB;  Service: Cardiovascular;;  . peripheral vascular stent Left     Prior to Admission medications   Medication Sig Start Date End Date Taking? Authorizing Provider  acetaminophen (TYLENOL) 500 MG tablet Take 1,000 mg by mouth 3 (three) times daily.    Yes [provider]  albuterol (PROVENTIL HFA;VENTOLIN HFA) 108 (90 BASE) MCG/ACT inhaler Inhale 2 puffs into the lungs every 4 (four) hours as needed for wheezing or shortness of breath.    Yes [provider]  benzonatate (TESSALON) 100 MG capsule Take 100 mg by mouth 3 (three) times daily.   Yes [provider]  bisoprolol (ZEBETA) 5 MG tablet Take 5 mg by mouth daily.   Yes [provider]  cyanocobalamin 1000 MCG tablet Take 1,000 mcg by mouth daily.   Yes [provider]  diphenhydrAMINE (BENADRYL) 25 mg capsule Take 25 mg by mouth every 8 (eight) hours as needed.   Yes [provider]  eplerenone (INSPRA) 25 MG tablet TAKE 1 TABLET ONCE DAILY 08/02/16  Yes [provider]  guaiFENesin (MUCINEX) 600 MG 12 hr tablet Take 600 mg by mouth 2 (two) times daily.   Yes [provider]  hydrocortisone 2.5 % cream Apply 1 application topically 3 (three) times daily as needed ([place]).    Yes [provider]  HYDROmorphone HCl (DILAUDID) 1 MG/ML LIQD Take 1 mg by mouth every 3 (three) hours  as needed for severe pain.   Yes [provider]  ipratropium-albuterol (DUONEB) 0.5-2.5 (3) MG/3ML SOLN Take 3 mLs by nebulization every 4 (four) hours as needed.   Yes [provider]  lidocaine (XYLOCAINE) 5 % ointment Apply 1 application topically 4 (four) times daily as needed.   Yes [provider]  LORazepam (ATIVAN) 0.5 MG tablet Take 0.5 mg by mouth every 8 (eight) hours.   Yes [provider]  losartan (COZAAR) 25 MG tablet TAKE 1 TABLET BY MOUTH DAILY 08/02/16  Yes [provider]  metolazone (ZAROXOLYN) 2.5 MG tablet Take 2.5 mg by mouth every Monday, Wednesday, and Friday.   Yes [provider]  nitroGLYCERIN (NITROSTAT) 0.4 MG SL tablet Take 0.4 mg by mouth every 5 (five) minutes x 3 doses as needed for chest pain.    Yes [provider]  omeprazole (PRILOSEC) 20 MG capsule Take 20 mg by mouth daily.    Yes [provider]  oxyCODONE (OXY IR/ROXICODONE) 5 MG immediate release tablet Take 10 mg by mouth every 2 (two) hours as needed for severe pain.   Yes [provider]  prochlorperazine (  COMPAZINE) 10 MG tablet Take 10 mg by mouth every 6 (six) hours as needed for nausea or vomiting.   Yes [provider]  Tetrahydrozoline HCl (VISINE OP) Place 2 drops into both eyes 2 (two) times daily as needed ([place]).   Yes [provider]  tiotropium (SPIRIVA HANDIHALER) 18 MCG inhalation capsule Place 1 capsule (18 mcg total) into inhaler and inhale daily. 03/06/15  Yes Sudini, Alveta Heimlich, MD  torsemide (DEMADEX) 20 MG tablet Take 40 mg by mouth 2 (two) times daily.   Yes [provider]  ALPRAZolam (XANAX) 0.25 MG tablet Take 1 tablet (0.25 mg total) by mouth 2 (two) times daily as needed for anxiety or sleep. Patient not taking: Reported on 01/28/2017 12/10/15   Hillary Bow, MD  furosemide (LASIX) 80 MG tablet Take 1 tablet (80 mg total) by mouth 2 (two) times daily. Patient not taking:  Reported on 01/28/2017 12/10/15   Hillary Bow, MD  HYDROcodone-acetaminophen [facility]) 10-325 MG tablet Take 1 tablet by mouth every 8 (eight) hours as needed for moderate pain. Patient not taking: Reported on 01/28/2017 12/10/15   Hillary Bow, MD    Allergies Lisinopril; Metoprolol; and Gabapentin  Family History  Problem Relation Age of Onset  . Heart attack Mother   . Stroke Mother   . Cancer Father   . COPD Father   . Heart attack Father     Social History Social History  Substance Use Topics  . Smoking status: Current Some Day Smoker    Packs/day: 0.25    Years: 45.00    Types: Cigarettes  . Smokeless tobacco: Never Used  . Alcohol use No    Review of Systems  Constitutional: Negative for fever. + weight gain Eyes: Negative for visual changes. ENT: Negative for sore throat. Neck: No neck pain  Cardiovascular: Negative for chest pain. Respiratory: + shortness of breath, productive cough Gastrointestinal: Negative for abdominal pain, vomiting or diarrhea. Genitourinary: Negative for dysuria. Musculoskeletal: Negative for back pain. + LE edema Skin: Negative for rash. Neurological: Negative for headaches, weakness or numbness. Psych: No SI or HI  ____________________________________________   PHYSICAL EXAM:  VITAL SIGNS: ED Triage Vitals  Enc Vitals Group     BP 01/28/17 1832 115/69     Pulse Rate 01/28/17 1832 98     Resp 01/28/17 1832 (!) 24     Temp 01/28/17 1832 97.8 F (36.6 C)     Temp Source 01/28/17 1832 Oral     SpO2 01/28/17 1832 100 %     Weight 01/28/17 1833 180 lb (81.6 kg)     Height --      Head Circumference --      Peak Flow --      Pain Score --      Pain Loc --      Pain Edu? --      Excl. in [place]? --     Constitutional: Alert and oriented, mild respiratory distress.  HEENT:      Head: Normocephalic and atraumatic.         Eyes: Conjunctivae are normal. Sclera is non-icteric.       Mouth/Throat: Mucous membranes are moist.        Neck: Supple with no signs of meningismus. Cardiovascular: Regular rate and rhythm. No murmurs, gallops, or rubs. 2+ symmetrical distal pulses are present in all extremities. Elevated JVD. Respiratory: Increased work of breathing, normal sats on 2.5 L nasal cannula, diffuse coarse rhonchi bilaterally  Gastrointestinal: Soft, non  tender, and non distended with positive bowel sounds. No rebound or guarding. Musculoskeletal: Left AKA, 2+ pitting edema on RLE Neurologic: Normal speech and language. Face is symmetric. Moving all extremities. No gross focal neurologic deficits are appreciated. Skin: Skin is warm, dry and intact. No rash noted. Psychiatric: Mood and affect are normal. Speech and behavior are normal.  ____________________________________________   LABS (all labs ordered are listed, but only abnormal results are displayed)  Labs Reviewed  CBC WITH DIFFERENTIAL/PLATELET - Abnormal; Notable for the following:       Result Value   WBC 12.9 (*)    Hemoglobin 9.8 (*)    HCT 31.9 (*)    MCV 71.2 (*)    MCH 21.9 (*)    MCHC 30.8 (*)    RDW 25.9 (*)    Neutro Abs 10.8 (*)    Lymphs Abs 0.9 (*)    All other components within normal limits  BASIC METABOLIC PANEL - Abnormal; Notable for the following:    Glucose, Bld 111 (*)    BUN 50 (*)    Creatinine, Ser 1.58 (*)    Calcium 8.7 (*)    GFR calc non Af Amer 41 (*)    GFR calc Af Amer 48 (*)    All other components within normal limits  BRAIN NATRIURETIC PEPTIDE - Abnormal; Notable for the following:    B Natriuretic Peptide >4,500.0 (*)    All other components within normal limits  TROPONIN I - Abnormal; Notable for the following:    Troponin I 0.03 (*)    All other components within normal limits  CULTURE, BLOOD (ROUTINE X 2)  CULTURE, BLOOD (ROUTINE X 2)  LACTIC ACID, PLASMA   ____________________________________________  EKG  ED ECG REPORT I, Rudene Re, the attending physician, personally viewed and  interpreted this ECG.  Normal sinus rhythm, rate of 96, normal intervals, right axis deviation, T-wave inversions on inferior and lateral leads which are unchanged from prior. ____________________________________________  RADIOLOGY  CXR:  Chronic small bilateral pleural effusions, improved.  Associated left lower lobe opacity, likely atelectasis. ____________________________________________   PROCEDURES  Procedure(s) performed: None Procedures Critical Care performed: yes  CRITICAL CARE Performed by: Rudene Re  ?  Total critical care time: 35 min  Critical care time was exclusive of separately billable procedures and treating other patients.  Critical care was necessary to treat or prevent imminent or life-threatening deterioration.  Critical care was time spent personally by me on the following activities: development of treatment plan with patient and/or surrogate as well as nursing, discussions with consultants, evaluation of patient's response to treatment, examination of patient, obtaining history from patient or surrogate, ordering and performing treatments and interventions, ordering and review of laboratory studies, ordering and review of radiographic studies, pulse oximetry and re-evaluation of patient's condition.  ____________________________________________   INITIAL IMPRESSION / ASSESSMENT AND PLAN / ED COURSE  76 y.o. male with h/o CHF (EF 10-15%), COPD on 3L [place], HTN, HLD, recurrent R sided pleural effusions who presents for evaluation of SOB worsening for 3 days, cough productive of brown sputum. Patient found hypoxic per EMS with improvement of his respiratory status after receiving a DuoNeb. He is currently satting 100% on 2.5 L nasal cannula. Patient looks volume overloaded. He is afebrile. Has coarse rhonchi on b/l lungs. Ddx CHF, PNA, COPD, recurrent pleural effusion. Plan for CXR, labs, EKG.  Clinical Course as of Jan 28 2002  Sun Jan 28, 2017    1910 Chest x-ray showing  a left lower lobe infiltrate, white count of 12.9 and a cough productive of brown sputum concerning for pneumonia. Since patient has been recently admitted to the hospital less than 2 months ago treated for healthcare associated pneumonia with IV cefepime, IV Vanco, and by mouth azithromycin.  [CV]  2001 BNP > 4500, gave 80 mg IV lasix. Will admit  [CV]    Clinical Course User Index [CV] Rudene Re, MD    Pertinent labs & imaging results that were available during my care of the patient were reviewed by me and considered in my medical decision making (see chart for details).    ____________________________________________   FINAL CLINICAL IMPRESSION(S) / ED DIAGNOSES  Final diagnoses:  Acute respiratory failure with hypoxia (HCC)  Healthcare-associated pneumonia  COPD exacerbation (HCC)  Acute on chronic congestive heart failure, unspecified heart failure type ([place])      NEW MEDICATIONS STARTED DURING THIS VISIT:  New Prescriptions   No medications on file     Note:  This document was prepared using Dragon voice recognition software and may include unintentional dictation errors.    Alfred Levins, [place], MD 01/28/17 2003

## 2017-01-28 NOTE — Progress Notes (Signed)
Advanced care plan.  Purpose of the Encounter: CODE STATUS  Parties in Attendance: Patient and his wife  Patient's Decision Capacity: Intact  Subjective/Patient's story: Patient is a 76 year old white male who is currently being followed by hospice for end-stage CHF, COPD and likely colon cancer according to wife is being admitted with acute respiratory failure.    Objective/Medical story discussed DO NOT RESUSCITATE status according to patient and his wife do have not made the final decision would like to remain full code    Goals of care determination: Full code    CODE STATUS: full code   Time spent discussing advanced care planning: 15 minutes

## 2017-01-28 NOTE — H&P (Signed)
[name] [place] NAME: Collin [name]    MR#:  [id number]  DATE OF BIRTH:  01/29/1941  DATE OF ADMISSION:  01/28/2017  PRIMARY CARE PHYSICIAN: [name], MD   REQUESTING/REFERRING PHYSICIAN:  Earmon Phoenix Md  CHIEF COMPLAINT:   Chief Complaint  Patient presents with  . Shortness of Breath    HISTORY OF PRESENT ILLNESS: Collin [name]  is a 76 y.o. male with a known history of Anemia, anxiety, COPD, chronic systolic CHF, GERD, chronic respiratory failure and according to his wife likely colon cancer who is presenting to the emergency room with complaint of shortness of breath ongoing for the past few days. Patient actually is being followed by hospice but he is a full code. He has been having a productive cough and wheezing. He denies any chest pain or palpitations. Also complains of swelling of the lower extremity. Reports nocturnal dyspnea and orthopnea. PAST MEDICAL HISTORY:   Past Medical History:  Diagnosis Date  . Anemia   . Anxiety   . Arthritis   . Asthma   . Chronic systolic CHF (congestive heart failure) ([place])   . COPD (chronic obstructive pulmonary disease) ([place])   . GERD (gastroesophageal reflux disease)   . History of home oxygen therapy   . HLD (hyperlipidemia)   . HTN (hypertension)   . Hx of nephrostomy ([place])   . Hypertensive retinopathy   . Kidney stone   . Macular degeneration   . Peripheral vascular disease of lower extremity ([place])   . Pleural effusion   . Spinal stenosis of lumbar region     PAST SURGICAL HISTORY: Past Surgical History:  Procedure Laterality Date  . ABDOMINAL AORTIC ANEURYSM REPAIR    . CHEST TUBE INSERTION Right 07/07/2015   Procedure: INSERTION PLEURAL DRAINAGE CATHETER;  Surgeon: Nestor Lewandowsky, MD;  Location: [facility] ORS;  Service: Thoracic;  Laterality: Right;  . HERNIA REPAIR     Umbilical  . IR GENERIC HISTORICAL  06/21/2016   IR NEPHROSTOMY EXCHANGE RIGHT 06/21/2016 Sandi Mariscal,  MD [facility]INTERV RAD  . KIDNEY STONE SURGERY    . LEG AMPUTATION Left    above knee amputation  . NEPHROSTOMY TUBE PLACEMENT ([place] HX)    . PERIPHERAL VASCULAR CATHETERIZATION N/A 11/03/2015   Procedure: Abdominal Aortogram w/Lower Extremity;  Surgeon: Algernon Huxley, MD;  Location: [place] [facility];  Service: Cardiovascular;  Laterality: N/A;  . PERIPHERAL VASCULAR CATHETERIZATION  11/03/2015   Procedure: Lower Extremity Intervention;  Surgeon: Algernon Huxley, MD;  Location: [place] [facility];  Service: Cardiovascular;;  . PERIPHERAL VASCULAR CATHETERIZATION Left 11/10/2015   Procedure: Lower Extremity Angiography;  Surgeon: Algernon Huxley, MD;  Location: [place] [facility];  Service: Cardiovascular;  Laterality: Left;  . PERIPHERAL VASCULAR CATHETERIZATION  11/10/2015   Procedure: Lower Extremity Intervention;  Surgeon: Algernon Huxley, MD;  Location: [place] [facility];  Service: Cardiovascular;;  . peripheral vascular stent Left     SOCIAL HISTORY:  Social History  Substance Use Topics  . Smoking status: Current Some Day Smoker    Packs/day: 0.25    Years: 45.00    Types: Cigarettes  . Smokeless tobacco: Never Used  . Alcohol use No    FAMILY HISTORY:  Family History  Problem Relation Age of Onset  . Heart attack Mother   . Stroke Mother   . Cancer Father   . COPD Father   . Heart attack Father  DRUG ALLERGIES:  Allergies  Allergen Reactions  . Lisinopril Anaphylaxis  . Metoprolol Other (See Comments)    Reaction:  Ringing in ears   . Gabapentin Rash    REVIEW OF SYSTEMS:   CONSTITUTIONAL: No fever, fatigue or weakness.  EYES: No blurred or double vision.  EARS, NOSE, AND THROAT: No tinnitus or ear pain.  RESPIRATORY: Positive cough, positive shortness of breath, positive wheezing  no hemoptysis.  CARDIOVASCULAR: No chest pain, orthopnea, edema.  GASTROINTESTINAL: No nausea, vomiting, diarrhea or abdominal pain.  GENITOURINARY: No dysuria, hematuria.  ENDOCRINE: No  polyuria, nocturia,  HEMATOLOGY: No anemia, easy bruising or bleeding SKIN: No rash or lesion. MUSCULOSKELETAL: No joint pain or arthritis.   NEUROLOGIC: No tingling, numbness, weakness.  PSYCHIATRY: No anxiety or depression.   MEDICATIONS AT HOME:  Prior to Admission medications   Medication Sig Start Date End Date Taking? Authorizing Provider  acetaminophen (TYLENOL) 500 MG tablet Take 1,000 mg by mouth 3 (three) times daily.    Yes [provider]  albuterol (PROVENTIL HFA;VENTOLIN HFA) 108 (90 BASE) MCG/ACT inhaler Inhale 2 puffs into the lungs every 4 (four) hours as needed for wheezing or shortness of breath.    Yes [provider]  benzonatate (TESSALON) 100 MG capsule Take 100 mg by mouth 3 (three) times daily.   Yes [provider]  bisoprolol (ZEBETA) 5 MG tablet Take 5 mg by mouth daily.   Yes [provider]  cyanocobalamin 1000 MCG tablet Take 1,000 mcg by mouth daily.   Yes [provider]  diphenhydrAMINE (BENADRYL) 25 mg capsule Take 25 mg by mouth every 8 (eight) hours as needed.   Yes [provider]  eplerenone (INSPRA) 25 MG tablet TAKE 1 TABLET ONCE DAILY 08/02/16  Yes [provider]  guaiFENesin (MUCINEX) 600 MG 12 hr tablet Take 600 mg by mouth 2 (two) times daily.   Yes [provider]  hydrocortisone 2.5 % cream Apply 1 application topically 3 (three) times daily as needed (ITCHING).    Yes [provider]  HYDROmorphone HCl (DILAUDID) 1 MG/ML LIQD Take 1 mg by mouth every 3 (three) hours as needed for severe pain.   Yes [provider]  ipratropium-albuterol (DUONEB) 0.5-2.5 (3) MG/3ML SOLN Take 3 mLs by nebulization every 4 (four) hours as needed.   Yes [provider]  lidocaine (XYLOCAINE) 5 % ointment Apply 1 application topically 4 (four) times daily as needed.   Yes [provider]  LORazepam (ATIVAN) 0.5 MG tablet Take 0.5 mg by mouth every 8 (eight)  hours.   Yes [provider]  losartan (COZAAR) 25 MG tablet TAKE 1 TABLET BY MOUTH DAILY 08/02/16  Yes [provider]  metolazone (ZAROXOLYN) 2.5 MG tablet Take 2.5 mg by mouth every Monday, Wednesday, and Friday.   Yes [provider]  nitroGLYCERIN (NITROSTAT) 0.4 MG SL tablet Take 0.4 mg by mouth every 5 (five) minutes x 3 doses as needed for chest pain.    Yes [provider]  omeprazole (PRILOSEC) 20 MG capsule Take 20 mg by mouth daily.    Yes [provider]  oxyCODONE (OXY IR/ROXICODONE) 5 MG immediate release tablet Take 10 mg by mouth every 2 (two) hours as needed for severe pain.   Yes [provider]  prochlorperazine (COMPAZINE) 10 MG tablet Take 10 mg by mouth every 6 (six) hours as needed for nausea or vomiting.   Yes [provider]  Tetrahydrozoline HCl (VISINE OP) Place  2 drops into both eyes 2 (two) times daily as needed (DRYNESS).   Yes [provider]  tiotropium (SPIRIVA HANDIHALER) 18 MCG inhalation capsule Place 1 capsule (18 mcg total) into inhaler and inhale daily. 03/06/15  Yes Sudini, Alveta Heimlich, MD  torsemide (DEMADEX) 20 MG tablet Take 40 mg by mouth 2 (two) times daily.   Yes [provider]  ALPRAZolam (XANAX) 0.25 MG tablet Take 1 tablet (0.25 mg total) by mouth 2 (two) times daily as needed for anxiety or sleep. Patient not taking: Reported on 01/28/2017 12/10/15   Hillary Bow, MD  furosemide (LASIX) 80 MG tablet Take 1 tablet (80 mg total) by mouth 2 (two) times daily. Patient not taking: Reported on 01/28/2017 12/10/15   Hillary Bow, MD  HYDROcodone-acetaminophen [facility]) 10-325 MG tablet Take 1 tablet by mouth every 8 (eight) hours as needed for moderate pain. Patient not taking: Reported on 01/28/2017 12/10/15   Hillary Bow, MD      PHYSICAL EXAMINATION:   VITAL SIGNS: Blood pressure 119/71, pulse 96, temperature 97.8 F (36.6 C), temperature source Oral, resp. rate (!) 24,  weight 180 lb (81.6 kg), SpO2 100 %.  GENERAL:  76 y.o.-year-old patient lying in the bed with no acute distress.  EYES: Pupils equal, round, reactive to light and accommodation. No scleral icterus. Extraocular muscles intact.  HEENT: Head atraumatic, normocephalic. Oropharynx and nasopharynx clear.  NECK:  Supple, no jugular venous distention. No thyroid enlargement, no tenderness.  LUNGS: Bilateral wheezing throughout both lungs as well as occasional crackles no assesory muscle usage CARDIOVASCULAR: S1, S2 normal. No murmurs, rubs, or gallops.  ABDOMEN: Soft, nontender, nondistended. Bowel sounds present. No organomegaly or mass.  EXTREMITIES: 1+ pedal edema, cyanosis, or clubbing.  NEUROLOGIC: Cranial nerves II through XII are intact. Muscle strength 5/5 in all extremities. Sensation intact. Gait not checked.  PSYCHIATRIC: The patient is alert and oriented x 3.  SKIN: No obvious rash, lesion, or ulcer.   LABORATORY PANEL:   CBC  Recent Labs Lab 01/28/17 1830  WBC 12.9*  HGB 9.8*  HCT 31.9*  PLT 325  MCV 71.2*  MCH 21.9*  MCHC 30.8*  RDW 25.9*  LYMPHSABS 0.9*  MONOABS 0.7  EOSABS 0.3  BASOSABS 0.1   ------------------------------------------------------------------------------------------------------------------  Chemistries   Recent Labs Lab 01/28/17 1830  NA 136  K 4.6  CL 102  CO2 25  GLUCOSE 111*  BUN 50*  CREATININE 1.58*  CALCIUM 8.7*   ------------------------------------------------------------------------------------------------------------------ estimated creatinine clearance is 47.3 mL/min (A) (by C-G formula based on SCr of 1.58 mg/dL (H)). ------------------------------------------------------------------------------------------------------------------ No results for input(s): TSH, T4TOTAL, T3FREE, THYROIDAB in the last 72 hours.  Invalid input(s): FREET3   Coagulation profile No results for input(s): INR, PROTIME in the last 168  hours. ------------------------------------------------------------------------------------------------------------------- No results for input(s): DDIMER in the last 72 hours. -------------------------------------------------------------------------------------------------------------------  Cardiac Enzymes  Recent Labs Lab 01/28/17 1830  TROPONINI 0.03*   ------------------------------------------------------------------------------------------------------------------ Invalid input(s): POCBNP  ---------------------------------------------------------------------------------------------------------------  Urinalysis    Component Value Date/Time   COLORURINE YELLOW (A) 09/09/2016 2118   APPEARANCEUR CLOUDY (A) 09/09/2016 2118   APPEARANCEUR Cloudy (A) 07/21/2016 1329   LABSPEC 1.018 09/09/2016 2118   LABSPEC 1.015 08/15/2014 0745   PHURINE 5.0 09/09/2016 2118   GLUCOSEU NEGATIVE 09/09/2016 2118   GLUCOSEU 50 mg/dL 08/15/2014 0745   HGBUR MODERATE (A) 09/09/2016 2118   BILIRUBINUR NEGATIVE 09/09/2016 2118   BILIRUBINUR Negative 07/21/2016 1329   BILIRUBINUR Negative 08/15/2014 0745   KETONESUR NEGATIVE 09/09/2016 2118   PROTEINUR 100 (  A) 09/09/2016 2118   NITRITE NEGATIVE 09/09/2016 2118   LEUKOCYTESUR LARGE (A) 09/09/2016 2118   LEUKOCYTESUR 2+ (A) 07/21/2016 1329   LEUKOCYTESUR Trace 08/15/2014 0745     RADIOLOGY: Dg Chest Portable 1 View  Result Date: 01/28/2017 CLINICAL DATA:  Shortness breath EXAM: PORTABLE CHEST 1 VIEW COMPARISON:  Chest radiograph dated 09/15/2016. CT chest dated 09/10/2016. FINDINGS: Small left pleural effusion. Associated left lower lobe opacity, likely atelectasis. This appearance is chronic but improved. Small, loculated right pleural effusion. Associated subpleural scarring/ fibrosis. This appearance is also chronic but improved. No frank interstitial edema.  No pneumothorax. Cardiomegaly. IMPRESSION: Chronic small bilateral pleural effusions,  improved. Associated left lower lobe opacity, likely atelectasis. Electronically Signed   By: Julian Hy M.D.   On: 01/28/2017 18:56    EKG: Orders placed or performed during the hospital encounter of 01/28/17  . ED EKG  . ED EKG  . EKG 12-Lead  . EKG 12-Lead  . EKG 12-Lead  . EKG 12-Lead    IMPRESSION AND PLAN: Patient is a 76 year old white male with chronic respiratory failure followed by hospice presenting with shortness of breath lower extremity swelling  1. Acute on chronic respiratory failure This is due to combination of acute on chronic systolic CHF, acute on chronic COPD exasperation as well as pneumonia I will treat patient with IV Lasix, nebulizer and steroids Continue Demadex 2. Acute on chronic systolic CHF Treat with IV Lasix Continue Zaroxolyn  Continue Cozaar  3. GERD continue omeprazole  4. Acute on chronic COPD exasperation we will treat with nebulizers steroids and antibiotics  5. Acute pneumonia I will treat with IV levquin   6. CODE STATUS readdressed with the patient he wants to remain full code if does not improve may benefit from palliative care consult :  All the records are reviewed and case discussed with ED provider. Management plans discussed with the patient, family and they are in agreement.  CODE STATUS: Code Status History    Date Active Date Inactive Code Status Order ID Comments User Context   09/09/2016 10:51 PM 09/15/2016  8:35 PM Full Code [id number]  Lance Coon, MD Inpatient   12/07/2015  4:43 PM 12/10/2015  7:02 PM Full Code [id number]  Dustin Flock, MD ED   11/29/2015  8:27 PM 12/01/2015  4:15 PM Full Code [id number]  Sylvan Cheese, MD ED   10/26/2015 10:08 PM 10/29/2015  5:25 PM Full Code [id number]  Samson Frederic, DO Inpatient   08/22/2015  5:44 PM 08/31/2015  4:39 PM Full Code [id number]  Theodoro Grist, MD Inpatient   07/07/2015  1:57 PM 07/08/2015  2:28 PM Full Code [id number]  Nestor Lewandowsky, MD Inpatient   06/12/2015   3:54 PM 06/14/2015  5:01 PM Full Code [id number]  Baxter Hire, MD Inpatient   03/20/2015 12:20 AM 03/21/2015  9:25 PM Full Code [id number]  Lance Coon, MD ED   03/04/2015  5:52 AM 03/06/2015  2:26 PM Full Code [id number]  Lance Coon, MD Inpatient       TOTAL TIME TAKING CARE OF THIS PATIENT:56minutes.    Dustin Flock M.D on 01/28/2017 at 8:29 PM  Between 7am to 6pm - Pager - [phone]  After 6pm go to [email or web address] - password [email or web address] [place]  Office  [phone]  CC: Primary care physician; [name], MD

## 2017-01-28 NOTE — ED Triage Notes (Signed)
Pt came to ED via EMS c/o sob going on for the past week, worsening today. Per EMS, on their arrival, pt was using neb treatment, satting 78% with neb, placed on non rebreather. Satting 88% r/a on arrival. HR 98. History of copd, chf, htn. 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 On o2 chronically.

## 2017-01-29 LAB — CBC
HEMATOCRIT: 27.5 % — AB (ref 40.0–52.0)
HEMOGLOBIN: 8.4 g/dL — AB (ref 13.0–18.0)
MCH: 21.1 pg — AB (ref 26.0–34.0)
MCHC: 30.4 g/dL — ABNORMAL LOW (ref 32.0–36.0)
MCV: 69.3 fL — AB (ref 80.0–100.0)
Platelets: 316 10*3/uL (ref 150–440)
RBC: 3.97 MIL/uL — AB (ref 4.40–5.90)
RDW: 26.4 % — ABNORMAL HIGH (ref 11.5–14.5)
WBC: 12.7 10*3/uL — ABNORMAL HIGH (ref 3.8–10.6)

## 2017-01-29 LAB — BASIC METABOLIC PANEL
Anion gap: 9 (ref 5–15)
BUN: 52 mg/dL — AB (ref 6–20)
CO2: 27 mmol/L (ref 22–32)
Calcium: 8.4 mg/dL — ABNORMAL LOW (ref 8.9–10.3)
Chloride: 100 mmol/L — ABNORMAL LOW (ref 101–111)
Creatinine, Ser: 1.69 mg/dL — ABNORMAL HIGH (ref 0.61–1.24)
GFR calc Af Amer: 44 mL/min — ABNORMAL LOW (ref >60)
GFR calc non Af Amer: 38 mL/min — ABNORMAL LOW (ref >60)
Glucose, Bld: 195 mg/dL — ABNORMAL HIGH (ref 65–99)
POTASSIUM: 4.6 mmol/L (ref 3.5–5.1)
SODIUM: 136 mmol/L (ref 135–145)

## 2017-01-29 LAB — TROPONIN I

## 2017-01-29 MED ORDER — LEVOFLOXACIN 500 MG PO TABS: 500.0000 mg | ORAL_TABLET | Freq: Every day | ORAL | Status: DC

## 2017-01-29 MED ORDER — FUROSEMIDE 40 MG PO TABS: 80.0000 mg | ORAL_TABLET | Freq: Two times a day (BID) | ORAL | Status: DC

## 2017-01-29 MED ORDER — LEVOFLOXACIN 750 MG PO TABS
750.0000 mg | ORAL_TABLET | ORAL | Status: DC
  Administered 2017-01-29: 750 mg via ORAL
  Filled 2017-01-29: qty 1

## 2017-01-29 MED ORDER — TORSEMIDE 20 MG PO TABS
40.0000 mg | ORAL_TABLET | Freq: Two times a day (BID) | ORAL | Status: DC
  Administered 2017-01-29 – 2017-01-30 (×2): 40 mg via ORAL
  Filled 2017-01-29 (×2): qty 2

## 2017-01-29 MED ORDER — PREDNISONE 50 MG PO TABS
50.0000 mg | ORAL_TABLET | Freq: Every day | ORAL | Status: DC
  Administered 2017-01-30: 50 mg via ORAL
  Filled 2017-01-29: qty 1

## 2017-01-29 MED ORDER — SENNOSIDES-DOCUSATE SODIUM 8.6-50 MG PO TABS
1.0000 | ORAL_TABLET | Freq: Two times a day (BID) | ORAL | Status: DC
  Administered 2017-01-30: 1 via ORAL
  Filled 2017-01-29 (×2): qty 1

## 2017-01-29 MED ORDER — OXYCODONE HCL 5 MG PO TABS
10.0000 mg | ORAL_TABLET | ORAL | Status: DC
  Administered 2017-01-29 – 2017-01-30 (×13): 10 mg via ORAL
  Filled 2017-01-29 (×15): qty 2

## 2017-01-29 MED ORDER — HYDROMORPHONE HCL 2 MG PO TABS
1.0000 mg | ORAL_TABLET | ORAL | Status: DC | PRN
  Administered 2017-01-29: 1 mg via ORAL
  Filled 2017-01-29: qty 1

## 2017-01-29 NOTE — Progress Notes (Signed)
Patient requested to have oxycodone scheduled every 2 hours as per his hospice at home regimen.  Verbal order obtained to change medications as per patients request.

## 2017-01-29 NOTE — Progress Notes (Signed)
Pharmacist - Prescriber Communication  Levofloxacin dose modified from 500 mg po Q24H to 750 mg po Q48H due to creatinine clearance 20 to 49 mL/min.   Leanny Moeckel A. Perry, [place].D., BCPS Clinical Pharmacist 01/29/2017 14:37

## 2017-01-29 NOTE — Care Management (Addendum)
Patient is currently being followed by [facility] for approximately past 2 months. He is not sure of his hospice diagnosis.  Has oxygen that was switched from [facility] to [facility] when patient was opened to hospice and he is not happy with the concentrator.  Patient has a chronic nephostomy tube.  Spoke to [facility] and informed hospice services have been revoked.  Informed that a form was faxed to the unit this morning and patient's wife signed and revoked hospice.  Patient made no mention of this to CM during assessment. Hospice diagnosis is COPD. Discussed need for palliative consult with attending due to revoking hospice and patient not mentioning it.  CM will speak with wife before pursuing the consult.  Apparently, wife did not mention it to attending when rounded.

## 2017-01-29 NOTE — Progress Notes (Signed)
Xanax 0.25mg  PO given for anxiety.

## 2017-01-29 NOTE — Progress Notes (Addendum)
[facility] [place] NAME: Collin [name]    MR#:  [id number]  DATE OF BIRTH:  August 18, 1941  SUBJECTIVE:  CHIEF COMPLAINT:   Chief Complaint  Patient presents with  . Shortness of Breath   - admitted for shortness of breath. Noted to have COPD and CHF exacerbation. -Feeling much better. Still requiring higher oxygen  REVIEW OF SYSTEMS:  Review of Systems  Constitutional: Negative for chills, fever and malaise/fatigue.  HENT: Negative for congestion, ear discharge, hearing loss and nosebleeds.   Eyes: Negative for blurred vision.  Respiratory: Positive for shortness of breath. Negative for cough and wheezing.   Cardiovascular: Negative for chest pain, palpitations and leg swelling.  Gastrointestinal: Negative for abdominal pain, constipation, diarrhea, nausea and vomiting.  Genitourinary: Negative for dysuria.  Musculoskeletal: Negative for myalgias.  Neurological: Negative for dizziness, speech change, focal weakness, seizures and headaches.  Psychiatric/Behavioral: Negative for depression.    DRUG ALLERGIES:   Allergies  Allergen Reactions  . Lisinopril Anaphylaxis  . Metoprolol Other (See Comments)    Reaction:  Ringing in ears   . Gabapentin Rash    VITALS:  Blood pressure 119/67, pulse 91, temperature 98.3 F (36.8 C), temperature source Oral, resp. rate 16, height 6\' 2"  (1.88 m), weight 82.6 kg (182 lb), SpO2 99 %.  PHYSICAL EXAMINATION:  Physical Exam  GENERAL:  76 y.o.-year-old patient lying in the bed with no acute distress.  EYES: Pupils equal, round, reactive to light and accommodation. No scleral icterus. Extraocular muscles intact.  HEENT: Head atraumatic, normocephalic. Oropharynx and nasopharynx clear.  NECK:  Supple, no jugular venous distention. No thyroid enlargement, no tenderness.  LUNGS: Normal breath sounds bilaterally, no wheezing, rhonchi or crepitation. No use of accessory muscles of respiration.  Bibasilar crackles heard at the bases CARDIOVASCULAR: S1, S2 normal. No rubs, or gallops. 2/6 systolic murmur is present ABDOMEN: Soft, nontender, nondistended. Bowel sounds present. No organomegaly or mass.  EXTREMITIES: No pedal edema, cyanosis, or clubbing.  NEUROLOGIC: Cranial nerves II through XII are intact. Muscle strength 5/5 in all extremities. Sensation intact. Gait not checked.  PSYCHIATRIC: The patient is alert and oriented x 3.  SKIN: No obvious rash, lesion, or ulcer.    LABORATORY PANEL:   CBC  Recent Labs Lab 01/29/17 0412  WBC 12.7*  HGB 8.4*  HCT 27.5*  PLT 316   ------------------------------------------------------------------------------------------------------------------  Chemistries   Recent Labs Lab 01/29/17 0412  NA 136  K 4.6  CL 100*  CO2 27  GLUCOSE 195*  BUN 52*  CREATININE 1.69*  CALCIUM 8.4*   ------------------------------------------------------------------------------------------------------------------  Cardiac Enzymes  Recent Labs Lab 01/29/17 0412  TROPONINI <0.03   ------------------------------------------------------------------------------------------------------------------  RADIOLOGY:  Dg Chest Portable 1 View  Result Date: 01/28/2017 CLINICAL DATA:  Shortness breath EXAM: PORTABLE CHEST 1 VIEW COMPARISON:  Chest radiograph dated 09/15/2016. CT chest dated 09/10/2016. FINDINGS: Small left pleural effusion. Associated left lower lobe opacity, likely atelectasis. This appearance is chronic but improved. Small, loculated right pleural effusion. Associated subpleural scarring/ fibrosis. This appearance is also chronic but improved. No frank interstitial edema.  No pneumothorax. Cardiomegaly. IMPRESSION: Chronic small bilateral pleural effusions, improved. Associated left lower lobe opacity, likely atelectasis. Electronically Signed   By: Julian Hy M.D.   On: 01/28/2017 18:56    EKG:   Orders placed or performed  during the hospital encounter of 01/28/17  . ED EKG  . ED EKG  . EKG 12-Lead  . EKG 12-Lead  .  EKG 12-Lead  . 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  #1 acute on chronic respiratory failure-secondary to COPD and systolic CHF exacerbation. -Chest x-ray with basilar atelectasis versus pneumonia. -Feeling much better. Continue to wean oxygen and bring him down to his home oxygen levels. -continue duo nebs. Change Lasix to oral twice a day. - known EF 10-15%.. -Also on steroids-changed to by mouth prednisone. -Continue Levaquin.  #2 depression and anxiety-stable. Continue home medications  #3 hypertension-on losartan and bisoprolol  #4 GERD-on Protonix  #5 DVT prophylaxis-on Lovenox.  Patient is followed by [facility].     All the records are reviewed and case discussed with Care Management/Social Workerr. Management plans discussed with the patient, family and they are in agreement.  CODE STATUS: full code  TOTAL TIME TAKING CARE OF THIS PATIENT: 37 minutes.   POSSIBLE D/C IN 1-2 DAYS, DEPENDING ON CLINICAL CONDITION.   Gladstone Lighter M.D on 01/29/2017 at 10:35 AM  Between 7am to 6pm - Pager - [phone]  After 6pm go to [email or web address] - password [email or web address] [place] [facility]  Office  [phone]  CC: Primary care physician; [name], MD

## 2017-01-30 LAB — CBC
HEMATOCRIT: 26.9 % — AB (ref 40.0–52.0)
Hemoglobin: 8.2 g/dL — ABNORMAL LOW (ref 13.0–18.0)
MCH: 21.5 pg — ABNORMAL LOW (ref 26.0–34.0)
MCHC: 30.6 g/dL — AB (ref 32.0–36.0)
MCV: 70.3 fL — AB (ref 80.0–100.0)
Platelets: 329 10*3/uL (ref 150–440)
RBC: 3.83 MIL/uL — ABNORMAL LOW (ref 4.40–5.90)
RDW: 25.7 % — ABNORMAL HIGH (ref 11.5–14.5)
WBC: 16.2 10*3/uL — AB (ref 3.8–10.6)

## 2017-01-30 LAB — BASIC METABOLIC PANEL
ANION GAP: 7 (ref 5–15)
BUN: 58 mg/dL — ABNORMAL HIGH (ref 6–20)
CHLORIDE: 99 mmol/L — AB (ref 101–111)
CO2: 28 mmol/L (ref 22–32)
Calcium: 8.5 mg/dL — ABNORMAL LOW (ref 8.9–10.3)
Creatinine, Ser: 1.73 mg/dL — ABNORMAL HIGH (ref 0.61–1.24)
GFR calc Af Amer: 43 mL/min — ABNORMAL LOW (ref >60)
GFR calc non Af Amer: 37 mL/min — ABNORMAL LOW (ref >60)
GLUCOSE: 155 mg/dL — AB (ref 65–99)
Potassium: 4.4 mmol/L (ref 3.5–5.1)
Sodium: 134 mmol/L — ABNORMAL LOW (ref 135–145)

## 2017-01-30 MED ORDER — PREDNISONE 10 MG (21) PO TBPK: ORAL_TABLET | ORAL | 0 refills | Status: AC

## 2017-01-30 MED ORDER — OXYCODONE HCL 5 MG PO TABS: 10.0000 mg | ORAL_TABLET | ORAL | 0 refills | Status: AC | PRN

## 2017-01-30 MED ORDER — LEVOFLOXACIN 750 MG PO TABS: 750.0000 mg | ORAL_TABLET | ORAL | 0 refills | Status: AC

## 2017-01-30 NOTE — Care Management (Signed)
patient for discharge home.  Requests EMS transport.  Informed patient since it is not medically necessary would most likely not be covered by his insurance. Faxed discharge information and order to [facility] for resumption of care. patient and wife request to have services resumed.  They were informed they had to sing the hospice revoke form while in the hospital

## 2017-01-30 NOTE — Progress Notes (Signed)
Patient is being discharge home today via EMS discharge instruction provided, iv removed tele removed, EMS called waiting for transport

## 2017-01-30 NOTE — Care Management Important Message (Signed)
Important Message  Patient Details  Name: Collin [name] MRN: [id number] Date of Birth: [birth year]   Medicare Important Message Given:  Yes Signed IM notice given    Katrina Stack, RN 01/30/2017, 2:10 PM

## 2017-01-30 NOTE — Discharge Summary (Addendum)
[facility] NAME: Collin [name]    MR#:  [id number]  DATE OF BIRTH:  08/26/1941  DATE OF ADMISSION:  01/28/2017   ADMITTING PHYSICIAN: Dustin Flock, MD  DATE OF DISCHARGE:  01/30/17  PRIMARY CARE PHYSICIAN: [name], MD   ADMISSION DIAGNOSIS:   Healthcare-associated pneumonia [J18.9] COPD exacerbation ([place]) [J44.1] Acute respiratory failure with hypoxia ([place]) [J96.01] Acute on chronic congestive heart failure, unspecified heart failure type ([place]) [I50.9]  DISCHARGE DIAGNOSIS:   Active Problems:   Acute respiratory failure ([place])   SECONDARY DIAGNOSIS:   Past Medical History:  Diagnosis Date  . Anemia   . Anxiety   . Arthritis   . Asthma   . Chronic systolic CHF (congestive heart failure) ([place])   . COPD (chronic obstructive pulmonary disease) ([place])   . GERD (gastroesophageal reflux disease)   . History of home oxygen therapy   . HLD (hyperlipidemia)   . HTN (hypertension)   . Hx of nephrostomy ([place])   . Hypertensive retinopathy   . Kidney stone   . Macular degeneration   . Peripheral vascular disease of lower extremity ([place])   . Pleural effusion   . 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  #1 acute on chronic respiratory failure-secondary to COPD and systolic CHF exacerbation. -Chest x-ray with basilar atelectasis and pneumonia. -Feeling much better. On 3-4L o2 which is his baseline. -continue duo nebs prn. On oral torsemide bid - known EF 10-15%.. -Also on steroids-changed to by mouth prednisone taper. -Continue Levaquin for 5 more days. - low salt diet  #2 depression and anxiety-stable. Continue home  medications  #3 hypertension-on losartan and bisoprolol  #4 GERD-on Protonix   Patient is followed by [facility]. Resume services at discharge   DISCHARGE CONDITIONS:   Guarded  CONSULTS OBTAINED:   None  DRUG ALLERGIES:   Allergies  Allergen Reactions  . Lisinopril Anaphylaxis  . Metoprolol Other (See Comments)    Reaction:  Ringing in ears   . Gabapentin Rash   DISCHARGE MEDICATIONS:   Allergies as of 01/30/2017      Reactions   Lisinopril Anaphylaxis   Metoprolol Other (See Comments)   Reaction:  Ringing in ears    Gabapentin Rash      Medication List    STOP taking these medications   ALPRAZolam 0.25 MG tablet Commonly known as:  XANAX   furosemide 80 MG tablet Commonly known as:  LASIX   HYDROcodone-acetaminophen 10-325 MG tablet Commonly known as:  NORCO     TAKE these medications   acetaminophen 500 MG tablet Commonly known as:  TYLENOL Take 1,000 mg by mouth 3 (three) times daily.   albuterol 108 (90 Base) MCG/ACT inhaler Commonly known as:  PROVENTIL HFA;VENTOLIN HFA Inhale 2 puffs into the lungs every 4 (four) hours as needed for wheezing or shortness of breath.   benzonatate 100 MG capsule Commonly known as:  TESSALON Take 100 mg by mouth 3 (three) times daily.   bisoprolol 5 MG tablet Commonly known as:  ZEBETA Take 5 mg by mouth daily.   cyanocobalamin 1000 MCG tablet Take 1,000 mcg by mouth daily.   diphenhydrAMINE 25 mg capsule Commonly known as:  BENADRYL Take 25 mg by mouth every 8 (eight) hours as needed.   eplerenone 25 MG tablet Commonly known as:  INSPRA TAKE 1 TABLET ONCE DAILY   guaiFENesin 600 MG 12 hr tablet Commonly known as:  MUCINEX Take 600 mg by mouth 2 (two) times daily.   hydrocortisone 2.5 % cream Apply 1 application topically 3 (three) times daily as needed (ITCHING).   HYDROmorphone HCl 1 MG/ML Liqd Commonly known as:  DILAUDID Take 1 mg by mouth every 3 (three) hours as needed for severe  pain.   ipratropium-albuterol 0.5-2.5 (3) MG/3ML Soln Commonly known as:  DUONEB Take 3 mLs by nebulization every 4 (four) hours as needed.   levofloxacin 750 MG tablet Commonly known as:  LEVAQUIN Take 1 tablet (750 mg total) by mouth every other day. X 5 more days Start taking on:  01/31/2017   lidocaine 5 % ointment Commonly known as:  XYLOCAINE Apply 1 application topically 4 (four) times daily as needed.   LORazepam 0.5 MG tablet Commonly known as:  ATIVAN Take 0.5 mg by mouth every 8 (eight) hours.   losartan 25 MG tablet Commonly known as:  COZAAR TAKE 1 TABLET BY MOUTH DAILY   metolazone 2.5 MG tablet Commonly known as:  ZAROXOLYN Take 2.5 mg by mouth every Monday, Wednesday, and Friday.   NITROSTAT 0.4 MG SL tablet Generic drug:  nitroGLYCERIN Take 0.4 mg by mouth every 5 (five) minutes x 3 doses as needed for chest pain.   omeprazole 20 MG capsule Commonly known as:  PRILOSEC Take 20 mg by mouth daily.   oxyCODONE 5 MG immediate release tablet Commonly known as:  Oxy IR/ROXICODONE Take 10 mg by mouth every 2 (two) hours as needed for severe pain.   predniSONE 10 MG (21) Tbpk tablet Commonly known as:  STERAPRED UNI-PAK 21 TAB 6 tabs PO x 1 day 5 tabs PO x 1 day 4 tabs PO x 1 day 3 tabs PO x 1 day 2 tabs PO x 1 day 1 tab PO x 1 day and stop   prochlorperazine 10 MG tablet Commonly known as:  COMPAZINE Take 10 mg by mouth every 6 (six) hours as needed for nausea or vomiting.   tiotropium 18 MCG inhalation capsule Commonly known as:  SPIRIVA HANDIHALER Place 1 capsule (18 mcg total) into inhaler and inhale daily.   torsemide 20 MG tablet Commonly known as:  DEMADEX Take 40 mg by mouth 2 (two) times daily.   VISINE OP Place 2 drops into both eyes 2 (two) times daily as needed ([place]).        DISCHARGE INSTRUCTIONS:   1. PCP f/u in 2 weeks 2. Home hospice services can be resumed  DIET:   Cardiac diet  ACTIVITY:   Activity as  tolerated  OXYGEN:   Home Oxygen: Yes.    Oxygen Delivery: 4 liters/min via Patient connected to nasal cannula oxygen  DISCHARGE LOCATION:   home   If you experience worsening of your admission symptoms, develop shortness of breath, life threatening emergency, suicidal or homicidal thoughts you must seek medical attention immediately by calling 911 or calling your MD immediately  if symptoms less severe.  You Must read complete instructions/literature along with all the possible adverse reactions/side effects for all the Medicines you take and that have been prescribed to you. Take any new Medicines after you have completely understood and accpet all the possible adverse reactions/side effects.   Please note  You were cared for by  a hospitalist during your hospital stay. If you have any questions about your discharge medications or the care you received while you were in the hospital after you are discharged, you can call the unit and asked to speak with the hospitalist on call if the hospitalist that took care of you is not available. Once you are discharged, your primary care physician will handle any further medical issues. Please note that NO REFILLS for any discharge medications will be authorized once you are discharged, as it is imperative that you return to your primary care physician (or establish a relationship with a primary care physician if you do not have one) for your aftercare needs so that they can reassess your need for medications and monitor your lab values.    On the day of Discharge:  VITAL SIGNS:   Blood pressure 113/66, pulse 98, temperature 97.6 F (36.4 C), temperature source Oral, resp. rate 18, height 6\' 2"  (1.88 m), weight 82.6 kg (182 lb), SpO2 100 %.  PHYSICAL EXAMINATION:    GENERAL:  76 y.o.-year-old patient lying in the bed with no acute distress.  EYES: Pupils equal, round, reactive to light and accommodation. No scleral icterus. Extraocular muscles  intact.  HEENT: Head atraumatic, normocephalic. Oropharynx and nasopharynx clear.  NECK:  Supple, no jugular venous distention. No thyroid enlargement, no tenderness.  LUNGS: Normal breath sounds bilaterally, no wheezing, rhonchi or crepitation. No use of accessory muscles of respiration. Bibasilar crackles heard at the bases CARDIOVASCULAR: S1, S2 normal. No rubs, or gallops. 2/6 systolic murmur is present ABDOMEN: Soft, nontender, nondistended. Bowel sounds present. No organomegaly or mass.  Left nephrostomy tube in place draining clear amber colored urine EXTREMITIES: No pedal edema, cyanosis, or clubbing. S/p left AKA NEUROLOGIC: Cranial nerves II through XII are intact. Muscle strength 5/5 in all extremities. Sensation intact. Gait not checked.  PSYCHIATRIC: The patient is alert and oriented x 3.  SKIN: No obvious rash, lesion, or ulcer.   DATA REVIEW:   CBC  Recent Labs Lab 01/30/17 0604  WBC 16.2*  HGB 8.2*  HCT 26.9*  PLT 329    Chemistries   Recent Labs Lab 01/30/17 0604  NA 134*  K 4.4  CL 99*  CO2 28  GLUCOSE 155*  BUN 58*  CREATININE 1.73*  CALCIUM 8.5*     Microbiology Results  Results for orders placed or performed during the hospital encounter of 01/28/17  Blood culture (routine x 2)     Status: None (Preliminary result)   Collection Time: 01/28/17  7:32 PM  Result Value Ref Range Status   Specimen Description BLOOD BLOOD LEFT FOREARM  Final   Special Requests   Final    BOTTLES DRAWN AEROBIC AND ANAEROBIC Blood Culture adequate volume   Culture NO GROWTH 2 DAYS  Final   Report Status PENDING  Incomplete  Blood culture (routine x 2)     Status: None (Preliminary result)   Collection Time: 01/28/17  7:32 PM  Result Value Ref Range Status   Specimen Description BLOOD BLOOD LEFT FOREARM LATERAL  Final   Special Requests   Final    BOTTLES DRAWN AEROBIC AND ANAEROBIC Blood Culture adequate volume   Culture NO GROWTH 2 DAYS  Final   Report Status  PENDING  Incomplete    RADIOLOGY:  No results found.   Management plans discussed with the patient, family and they are in agreement.  CODE STATUS:     Code Status Orders  Start     Ordered   01/28/17 2149  Full code  Continuous     01/28/17 2148    Code Status History    Date Active Date Inactive Code Status Order ID Comments User Context   09/09/2016 10:51 PM 09/15/2016  8:35 PM Full Code [id number]  Lance Coon, MD Inpatient   12/07/2015  4:43 PM 12/10/2015  7:02 PM Full Code [id number]  Dustin Flock, MD ED   11/29/2015  8:27 PM 12/01/2015  4:15 PM Full Code [id number]  Sylvan Cheese, MD ED   10/26/2015 10:08 PM 10/29/2015  5:25 PM Full Code [id number]  Samson Frederic, DO Inpatient   08/22/2015  5:44 PM 08/31/2015  4:39 PM Full Code [id number]  Theodoro Grist, MD Inpatient   07/07/2015  1:57 PM 07/08/2015  2:28 PM Full Code [id number]  Nestor Lewandowsky, MD Inpatient   06/12/2015  3:54 PM 06/14/2015  5:01 PM Full Code [id number]  Baxter Hire, MD Inpatient   03/20/2015 12:20 AM 03/21/2015  9:25 PM Full Code [id number]  Lance Coon, MD ED   03/04/2015  5:52 AM 03/06/2015  2:26 PM Full Code [id number]  Lance Coon, MD Inpatient      TOTAL TIME TAKING CARE OF THIS PATIENT: 38 minutes.    Gladstone Lighter M.D on 01/30/2017 at 12:26 PM  Between 7am to 6pm - Pager - [phone]  After 6pm go to [email or web address] - Proofreader  [facility]  Office  [phone]  CC: Primary care physician; [name], MD   Note: This dictation was prepared with Dragon dictation along with smaller phrase technology. Any transcriptional errors that result from this process are unintentional.

## 2017-01-30 NOTE — Discharge Instructions (Signed)
[facility] appointment on Feb 08, 2017 at 11:00am with [name], [place]. Please call [phone] to reschedule.

## 2017-02-01 DIAGNOSIS — K6389 Other specified diseases of intestine: Secondary | ICD-10-CM | POA: Diagnosis not present

## 2017-02-01 DIAGNOSIS — J90 Pleural effusion, not elsewhere classified: Secondary | ICD-10-CM | POA: Diagnosis not present

## 2017-02-01 DIAGNOSIS — I5023 Acute on chronic systolic (congestive) heart failure: Secondary | ICD-10-CM | POA: Diagnosis not present

## 2017-02-01 DIAGNOSIS — J441 Chronic obstructive pulmonary disease with (acute) exacerbation: Secondary | ICD-10-CM | POA: Diagnosis not present

## 2017-02-01 DIAGNOSIS — D509 Iron deficiency anemia, unspecified: Secondary | ICD-10-CM | POA: Diagnosis not present

## 2017-02-01 DIAGNOSIS — J9621 Acute and chronic respiratory failure with hypoxia: Secondary | ICD-10-CM | POA: Diagnosis not present

## 2017-02-01 DIAGNOSIS — N183 Chronic kidney disease, stage 3 (moderate): Secondary | ICD-10-CM | POA: Diagnosis not present

## 2017-02-01 DIAGNOSIS — Z9981 Dependence on supplemental oxygen: Secondary | ICD-10-CM | POA: Diagnosis not present

## 2017-02-01 DIAGNOSIS — Z436 Encounter for attention to other artificial openings of urinary tract: Secondary | ICD-10-CM | POA: Diagnosis not present

## 2017-02-01 DIAGNOSIS — N200 Calculus of kidney: Secondary | ICD-10-CM | POA: Diagnosis not present

## 2017-02-01 DIAGNOSIS — Z993 Dependence on wheelchair: Secondary | ICD-10-CM | POA: Diagnosis not present

## 2017-02-01 DIAGNOSIS — I739 Peripheral vascular disease, unspecified: Secondary | ICD-10-CM | POA: Diagnosis not present

## 2017-02-01 DIAGNOSIS — Z89612 Acquired absence of left leg above knee: Secondary | ICD-10-CM | POA: Diagnosis not present

## 2017-02-01 DIAGNOSIS — F17210 Nicotine dependence, cigarettes, uncomplicated: Secondary | ICD-10-CM | POA: Diagnosis not present

## 2017-02-01 LAB — BLOOD CULTURE ID PANEL (REFLEXED)
ACINETOBACTER BAUMANNII: NOT DETECTED
CANDIDA ALBICANS: NOT DETECTED
CANDIDA GLABRATA: NOT DETECTED
Candida krusei: NOT DETECTED
Candida parapsilosis: NOT DETECTED
Candida tropicalis: NOT DETECTED
ENTEROBACTER CLOACAE COMPLEX: NOT DETECTED
ENTEROBACTERIACEAE SPECIES: NOT DETECTED
ENTEROCOCCUS SPECIES: NOT DETECTED
Escherichia coli: NOT DETECTED
HAEMOPHILUS INFLUENZAE: NOT DETECTED
KLEBSIELLA OXYTOCA: NOT DETECTED
KLEBSIELLA PNEUMONIAE: NOT DETECTED
LISTERIA MONOCYTOGENES: NOT DETECTED
NEISSERIA MENINGITIDIS: NOT DETECTED
Proteus species: NOT DETECTED
Pseudomonas aeruginosa: NOT DETECTED
STREPTOCOCCUS AGALACTIAE: NOT DETECTED
STREPTOCOCCUS PYOGENES: NOT DETECTED
STREPTOCOCCUS SPECIES: NOT DETECTED
Serratia marcescens: NOT DETECTED
Staphylococcus aureus (BCID): NOT DETECTED
Staphylococcus species: NOT DETECTED
Streptococcus pneumoniae: NOT DETECTED

## 2017-02-02 LAB — CULTURE, BLOOD (ROUTINE X 2)
CULTURE: NO GROWTH
SPECIAL REQUESTS: ADEQUATE

## 2017-02-02 NOTE — Progress Notes (Signed)
PHARMACY - PHYSICIAN COMMUNICATION CRITICAL VALUE ALERT - BLOOD CULTURE IDENTIFICATION (BCID)  Results for orders placed or performed during the hospital encounter of 01/28/17  Blood Culture ID Panel (Reflexed) (Collected: 01/28/2017  7:32 PM)  Result Value Ref Range   Enterococcus species NOT DETECTED NOT DETECTED   Listeria monocytogenes NOT DETECTED NOT DETECTED   Staphylococcus species NOT DETECTED NOT DETECTED   Staphylococcus aureus NOT DETECTED NOT DETECTED   Streptococcus species NOT DETECTED NOT DETECTED   Streptococcus agalactiae NOT DETECTED NOT DETECTED   Streptococcus pneumoniae NOT DETECTED NOT DETECTED   Streptococcus pyogenes NOT DETECTED NOT DETECTED   Acinetobacter baumannii NOT DETECTED NOT DETECTED   Enterobacteriaceae species NOT DETECTED NOT DETECTED   Enterobacter cloacae complex NOT DETECTED NOT DETECTED   Escherichia coli NOT DETECTED NOT DETECTED   Klebsiella oxytoca NOT DETECTED NOT DETECTED   Klebsiella pneumoniae NOT DETECTED NOT DETECTED   Proteus species NOT DETECTED NOT DETECTED   Serratia marcescens NOT DETECTED NOT DETECTED   Haemophilus influenzae NOT DETECTED NOT DETECTED   Neisseria meningitidis NOT DETECTED NOT DETECTED   Pseudomonas aeruginosa NOT DETECTED NOT DETECTED   Candida albicans NOT DETECTED NOT DETECTED   Candida glabrata NOT DETECTED NOT DETECTED   Candida krusei NOT DETECTED NOT DETECTED   Candida parapsilosis NOT DETECTED NOT DETECTED   Candida tropicalis NOT DETECTED NOT DETECTED    Name of physician (or Provider) Contacted: [place]  Changes to prescribed antibiotics required: None - remains on levofloxacin  Laural Benes, Pharm.D., BCPS Clinical Pharmacist 02/02/2017  7:15 AM

## 2017-02-03 LAB — CULTURE, BLOOD (ROUTINE X 2): SPECIAL REQUESTS: ADEQUATE

## 2017-02-06 DIAGNOSIS — J90 Pleural effusion, not elsewhere classified: Secondary | ICD-10-CM | POA: Diagnosis not present

## 2017-02-06 DIAGNOSIS — J441 Chronic obstructive pulmonary disease with (acute) exacerbation: Secondary | ICD-10-CM | POA: Diagnosis not present

## 2017-02-06 DIAGNOSIS — K6389 Other specified diseases of intestine: Secondary | ICD-10-CM | POA: Diagnosis not present

## 2017-02-06 DIAGNOSIS — I5023 Acute on chronic systolic (congestive) heart failure: Secondary | ICD-10-CM | POA: Diagnosis not present

## 2017-02-06 DIAGNOSIS — J9621 Acute and chronic respiratory failure with hypoxia: Secondary | ICD-10-CM | POA: Diagnosis not present

## 2017-02-06 DIAGNOSIS — D509 Iron deficiency anemia, unspecified: Secondary | ICD-10-CM | POA: Diagnosis not present

## 2017-02-07 DIAGNOSIS — D509 Iron deficiency anemia, unspecified: Secondary | ICD-10-CM | POA: Diagnosis not present

## 2017-02-07 DIAGNOSIS — J9621 Acute and chronic respiratory failure with hypoxia: Secondary | ICD-10-CM | POA: Diagnosis not present

## 2017-02-07 DIAGNOSIS — I5023 Acute on chronic systolic (congestive) heart failure: Secondary | ICD-10-CM | POA: Diagnosis not present

## 2017-02-07 DIAGNOSIS — K6389 Other specified diseases of intestine: Secondary | ICD-10-CM | POA: Diagnosis not present

## 2017-02-07 DIAGNOSIS — J90 Pleural effusion, not elsewhere classified: Secondary | ICD-10-CM | POA: Diagnosis not present

## 2017-02-07 DIAGNOSIS — J441 Chronic obstructive pulmonary disease with (acute) exacerbation: Secondary | ICD-10-CM | POA: Diagnosis not present

## 2017-02-08 ENCOUNTER — Ambulatory Visit: Payer: Medicare Other | Admitting: Family

## 2017-02-09 DIAGNOSIS — F17210 Nicotine dependence, cigarettes, uncomplicated: Secondary | ICD-10-CM | POA: Diagnosis not present

## 2017-02-09 DIAGNOSIS — D509 Iron deficiency anemia, unspecified: Secondary | ICD-10-CM | POA: Diagnosis not present

## 2017-02-09 DIAGNOSIS — Z993 Dependence on wheelchair: Secondary | ICD-10-CM | POA: Diagnosis not present

## 2017-02-09 DIAGNOSIS — N200 Calculus of kidney: Secondary | ICD-10-CM | POA: Diagnosis not present

## 2017-02-09 DIAGNOSIS — Z89612 Acquired absence of left leg above knee: Secondary | ICD-10-CM | POA: Diagnosis not present

## 2017-02-09 DIAGNOSIS — I5023 Acute on chronic systolic (congestive) heart failure: Secondary | ICD-10-CM | POA: Diagnosis not present

## 2017-02-09 DIAGNOSIS — N183 Chronic kidney disease, stage 3 (moderate): Secondary | ICD-10-CM | POA: Diagnosis not present

## 2017-02-09 DIAGNOSIS — J9621 Acute and chronic respiratory failure with hypoxia: Secondary | ICD-10-CM | POA: Diagnosis not present

## 2017-02-09 DIAGNOSIS — Z436 Encounter for attention to other artificial openings of urinary tract: Secondary | ICD-10-CM | POA: Diagnosis not present

## 2017-02-09 DIAGNOSIS — Z9981 Dependence on supplemental oxygen: Secondary | ICD-10-CM | POA: Diagnosis not present

## 2017-02-09 DIAGNOSIS — J90 Pleural effusion, not elsewhere classified: Secondary | ICD-10-CM | POA: Diagnosis not present

## 2017-02-09 DIAGNOSIS — J441 Chronic obstructive pulmonary disease with (acute) exacerbation: Secondary | ICD-10-CM | POA: Diagnosis not present

## 2017-02-09 DIAGNOSIS — I739 Peripheral vascular disease, unspecified: Secondary | ICD-10-CM | POA: Diagnosis not present

## 2017-02-09 DIAGNOSIS — K6389 Other specified diseases of intestine: Secondary | ICD-10-CM | POA: Diagnosis not present

## 2017-02-12 DIAGNOSIS — K6389 Other specified diseases of intestine: Secondary | ICD-10-CM | POA: Diagnosis not present

## 2017-02-12 DIAGNOSIS — J9621 Acute and chronic respiratory failure with hypoxia: Secondary | ICD-10-CM | POA: Diagnosis not present

## 2017-02-12 DIAGNOSIS — D509 Iron deficiency anemia, unspecified: Secondary | ICD-10-CM | POA: Diagnosis not present

## 2017-02-12 DIAGNOSIS — J441 Chronic obstructive pulmonary disease with (acute) exacerbation: Secondary | ICD-10-CM | POA: Diagnosis not present

## 2017-02-12 DIAGNOSIS — I5023 Acute on chronic systolic (congestive) heart failure: Secondary | ICD-10-CM | POA: Diagnosis not present

## 2017-02-12 DIAGNOSIS — J90 Pleural effusion, not elsewhere classified: Secondary | ICD-10-CM | POA: Diagnosis not present

## 2017-02-16 DIAGNOSIS — D509 Iron deficiency anemia, unspecified: Secondary | ICD-10-CM | POA: Diagnosis not present

## 2017-02-16 DIAGNOSIS — J9621 Acute and chronic respiratory failure with hypoxia: Secondary | ICD-10-CM | POA: Diagnosis not present

## 2017-02-16 DIAGNOSIS — J441 Chronic obstructive pulmonary disease with (acute) exacerbation: Secondary | ICD-10-CM | POA: Diagnosis not present

## 2017-02-16 DIAGNOSIS — I5023 Acute on chronic systolic (congestive) heart failure: Secondary | ICD-10-CM | POA: Diagnosis not present

## 2017-02-16 DIAGNOSIS — J90 Pleural effusion, not elsewhere classified: Secondary | ICD-10-CM | POA: Diagnosis not present

## 2017-02-16 DIAGNOSIS — K6389 Other specified diseases of intestine: Secondary | ICD-10-CM | POA: Diagnosis not present

## 2017-02-19 DIAGNOSIS — I5023 Acute on chronic systolic (congestive) heart failure: Secondary | ICD-10-CM | POA: Diagnosis not present

## 2017-02-19 DIAGNOSIS — D509 Iron deficiency anemia, unspecified: Secondary | ICD-10-CM | POA: Diagnosis not present

## 2017-02-19 DIAGNOSIS — J9621 Acute and chronic respiratory failure with hypoxia: Secondary | ICD-10-CM | POA: Diagnosis not present

## 2017-02-19 DIAGNOSIS — J441 Chronic obstructive pulmonary disease with (acute) exacerbation: Secondary | ICD-10-CM | POA: Diagnosis not present

## 2017-02-19 DIAGNOSIS — K6389 Other specified diseases of intestine: Secondary | ICD-10-CM | POA: Diagnosis not present

## 2017-02-19 DIAGNOSIS — J90 Pleural effusion, not elsewhere classified: Secondary | ICD-10-CM | POA: Diagnosis not present

## 2017-02-22 DIAGNOSIS — J9621 Acute and chronic respiratory failure with hypoxia: Secondary | ICD-10-CM | POA: Diagnosis not present

## 2017-02-22 DIAGNOSIS — D509 Iron deficiency anemia, unspecified: Secondary | ICD-10-CM | POA: Diagnosis not present

## 2017-02-22 DIAGNOSIS — J441 Chronic obstructive pulmonary disease with (acute) exacerbation: Secondary | ICD-10-CM | POA: Diagnosis not present

## 2017-02-22 DIAGNOSIS — K6389 Other specified diseases of intestine: Secondary | ICD-10-CM | POA: Diagnosis not present

## 2017-02-22 DIAGNOSIS — J90 Pleural effusion, not elsewhere classified: Secondary | ICD-10-CM | POA: Diagnosis not present

## 2017-02-22 DIAGNOSIS — I5023 Acute on chronic systolic (congestive) heart failure: Secondary | ICD-10-CM | POA: Diagnosis not present

## 2017-02-25 DIAGNOSIS — N132 Hydronephrosis with renal and ureteral calculous obstruction: Secondary | ICD-10-CM | POA: Diagnosis not present

## 2017-02-25 DIAGNOSIS — I739 Peripheral vascular disease, unspecified: Secondary | ICD-10-CM | POA: Diagnosis present

## 2017-02-25 DIAGNOSIS — R0902 Hypoxemia: Secondary | ICD-10-CM | POA: Diagnosis not present

## 2017-02-25 DIAGNOSIS — T83092A Other mechanical complication of nephrostomy catheter, initial encounter: Secondary | ICD-10-CM | POA: Diagnosis not present

## 2017-02-25 DIAGNOSIS — Z993 Dependence on wheelchair: Secondary | ICD-10-CM | POA: Diagnosis not present

## 2017-02-25 DIAGNOSIS — R0600 Dyspnea, unspecified: Secondary | ICD-10-CM | POA: Diagnosis not present

## 2017-02-25 DIAGNOSIS — Z79899 Other long term (current) drug therapy: Secondary | ICD-10-CM | POA: Diagnosis not present

## 2017-02-25 DIAGNOSIS — I5023 Acute on chronic systolic (congestive) heart failure: Secondary | ICD-10-CM | POA: Diagnosis not present

## 2017-02-25 DIAGNOSIS — R188 Other ascites: Secondary | ICD-10-CM | POA: Diagnosis not present

## 2017-02-25 DIAGNOSIS — K219 Gastro-esophageal reflux disease without esophagitis: Secondary | ICD-10-CM | POA: Diagnosis present

## 2017-02-25 DIAGNOSIS — F17210 Nicotine dependence, cigarettes, uncomplicated: Secondary | ICD-10-CM | POA: Diagnosis not present

## 2017-02-25 DIAGNOSIS — R531 Weakness: Secondary | ICD-10-CM | POA: Diagnosis not present

## 2017-02-25 DIAGNOSIS — Z8701 Personal history of pneumonia (recurrent): Secondary | ICD-10-CM | POA: Diagnosis not present

## 2017-02-25 DIAGNOSIS — N183 Chronic kidney disease, stage 3 (moderate): Secondary | ICD-10-CM | POA: Diagnosis present

## 2017-02-25 DIAGNOSIS — N401 Enlarged prostate with lower urinary tract symptoms: Secondary | ICD-10-CM | POA: Diagnosis present

## 2017-02-25 DIAGNOSIS — R918 Other nonspecific abnormal finding of lung field: Secondary | ICD-10-CM | POA: Diagnosis not present

## 2017-02-25 DIAGNOSIS — Z936 Other artificial openings of urinary tract status: Secondary | ICD-10-CM | POA: Diagnosis not present

## 2017-02-25 DIAGNOSIS — R05 Cough: Secondary | ICD-10-CM | POA: Diagnosis not present

## 2017-02-25 DIAGNOSIS — Z8679 Personal history of other diseases of the circulatory system: Secondary | ICD-10-CM | POA: Diagnosis not present

## 2017-02-25 DIAGNOSIS — R079 Chest pain, unspecified: Secondary | ICD-10-CM | POA: Diagnosis not present

## 2017-02-25 DIAGNOSIS — F17200 Nicotine dependence, unspecified, uncomplicated: Secondary | ICD-10-CM | POA: Diagnosis present

## 2017-02-25 DIAGNOSIS — N179 Acute kidney failure, unspecified: Secondary | ICD-10-CM | POA: Diagnosis present

## 2017-02-25 DIAGNOSIS — A419 Sepsis, unspecified organism: Secondary | ICD-10-CM | POA: Diagnosis not present

## 2017-02-25 DIAGNOSIS — Z66 Do not resuscitate: Secondary | ICD-10-CM | POA: Diagnosis present

## 2017-02-25 DIAGNOSIS — J449 Chronic obstructive pulmonary disease, unspecified: Secondary | ICD-10-CM | POA: Diagnosis not present

## 2017-02-25 DIAGNOSIS — Z87442 Personal history of urinary calculi: Secondary | ICD-10-CM | POA: Diagnosis not present

## 2017-02-25 DIAGNOSIS — J90 Pleural effusion, not elsewhere classified: Secondary | ICD-10-CM | POA: Diagnosis not present

## 2017-02-25 DIAGNOSIS — H3530 Unspecified macular degeneration: Secondary | ICD-10-CM | POA: Diagnosis present

## 2017-02-25 DIAGNOSIS — N136 Pyonephrosis: Secondary | ICD-10-CM | POA: Diagnosis not present

## 2017-02-25 DIAGNOSIS — Z89612 Acquired absence of left leg above knee: Secondary | ICD-10-CM | POA: Diagnosis not present

## 2017-02-25 DIAGNOSIS — J811 Chronic pulmonary edema: Secondary | ICD-10-CM | POA: Diagnosis not present

## 2017-02-25 DIAGNOSIS — I517 Cardiomegaly: Secondary | ICD-10-CM | POA: Diagnosis not present

## 2017-02-25 DIAGNOSIS — J9611 Chronic respiratory failure with hypoxia: Secondary | ICD-10-CM | POA: Diagnosis not present

## 2017-02-25 DIAGNOSIS — Z9981 Dependence on supplemental oxygen: Secondary | ICD-10-CM | POA: Diagnosis not present

## 2017-02-25 DIAGNOSIS — E785 Hyperlipidemia, unspecified: Secondary | ICD-10-CM | POA: Diagnosis present

## 2017-02-25 DIAGNOSIS — I130 Hypertensive heart and chronic kidney disease with heart failure and stage 1 through stage 4 chronic kidney disease, or unspecified chronic kidney disease: Secondary | ICD-10-CM | POA: Diagnosis not present

## 2017-02-25 DIAGNOSIS — R000 Tachycardia, unspecified: Secondary | ICD-10-CM | POA: Diagnosis not present

## 2017-03-02 DIAGNOSIS — N401 Enlarged prostate with lower urinary tract symptoms: Secondary | ICD-10-CM | POA: Diagnosis not present

## 2017-03-02 DIAGNOSIS — I429 Cardiomyopathy, unspecified: Secondary | ICD-10-CM | POA: Diagnosis not present

## 2017-03-02 DIAGNOSIS — I5023 Acute on chronic systolic (congestive) heart failure: Secondary | ICD-10-CM | POA: Diagnosis not present

## 2017-03-02 DIAGNOSIS — J441 Chronic obstructive pulmonary disease with (acute) exacerbation: Secondary | ICD-10-CM | POA: Diagnosis not present

## 2017-03-02 DIAGNOSIS — I130 Hypertensive heart and chronic kidney disease with heart failure and stage 1 through stage 4 chronic kidney disease, or unspecified chronic kidney disease: Secondary | ICD-10-CM | POA: Diagnosis not present

## 2017-03-02 DIAGNOSIS — F17210 Nicotine dependence, cigarettes, uncomplicated: Secondary | ICD-10-CM | POA: Diagnosis not present

## 2017-03-02 DIAGNOSIS — N200 Calculus of kidney: Secondary | ICD-10-CM | POA: Diagnosis not present

## 2017-03-02 DIAGNOSIS — K219 Gastro-esophageal reflux disease without esophagitis: Secondary | ICD-10-CM | POA: Diagnosis not present

## 2017-03-02 DIAGNOSIS — N132 Hydronephrosis with renal and ureteral calculous obstruction: Secondary | ICD-10-CM | POA: Diagnosis not present

## 2017-03-02 DIAGNOSIS — N183 Chronic kidney disease, stage 3 (moderate): Secondary | ICD-10-CM | POA: Diagnosis not present

## 2017-03-02 DIAGNOSIS — Z436 Encounter for attention to other artificial openings of urinary tract: Secondary | ICD-10-CM | POA: Diagnosis not present

## 2017-03-02 DIAGNOSIS — Z993 Dependence on wheelchair: Secondary | ICD-10-CM | POA: Diagnosis not present

## 2017-03-02 DIAGNOSIS — Z9981 Dependence on supplemental oxygen: Secondary | ICD-10-CM | POA: Diagnosis not present

## 2017-03-02 DIAGNOSIS — Z89612 Acquired absence of left leg above knee: Secondary | ICD-10-CM | POA: Diagnosis not present

## 2017-03-02 DIAGNOSIS — J9621 Acute and chronic respiratory failure with hypoxia: Secondary | ICD-10-CM | POA: Diagnosis not present

## 2017-03-02 DIAGNOSIS — I739 Peripheral vascular disease, unspecified: Secondary | ICD-10-CM | POA: Diagnosis not present

## 2017-03-02 DIAGNOSIS — M48061 Spinal stenosis, lumbar region without neurogenic claudication: Secondary | ICD-10-CM | POA: Diagnosis not present

## 2017-03-05 DIAGNOSIS — I429 Cardiomyopathy, unspecified: Secondary | ICD-10-CM | POA: Diagnosis not present

## 2017-03-05 DIAGNOSIS — I130 Hypertensive heart and chronic kidney disease with heart failure and stage 1 through stage 4 chronic kidney disease, or unspecified chronic kidney disease: Secondary | ICD-10-CM | POA: Diagnosis not present

## 2017-03-05 DIAGNOSIS — I5023 Acute on chronic systolic (congestive) heart failure: Secondary | ICD-10-CM | POA: Diagnosis not present

## 2017-03-05 DIAGNOSIS — N183 Chronic kidney disease, stage 3 (moderate): Secondary | ICD-10-CM | POA: Diagnosis not present

## 2017-03-05 DIAGNOSIS — J441 Chronic obstructive pulmonary disease with (acute) exacerbation: Secondary | ICD-10-CM | POA: Diagnosis not present

## 2017-03-05 DIAGNOSIS — J9621 Acute and chronic respiratory failure with hypoxia: Secondary | ICD-10-CM | POA: Diagnosis not present

## 2017-03-06 DIAGNOSIS — N183 Chronic kidney disease, stage 3 (moderate): Secondary | ICD-10-CM | POA: Diagnosis not present

## 2017-03-06 DIAGNOSIS — I429 Cardiomyopathy, unspecified: Secondary | ICD-10-CM | POA: Diagnosis not present

## 2017-03-06 DIAGNOSIS — I5023 Acute on chronic systolic (congestive) heart failure: Secondary | ICD-10-CM | POA: Diagnosis not present

## 2017-03-06 DIAGNOSIS — I130 Hypertensive heart and chronic kidney disease with heart failure and stage 1 through stage 4 chronic kidney disease, or unspecified chronic kidney disease: Secondary | ICD-10-CM | POA: Diagnosis not present

## 2017-03-06 DIAGNOSIS — J9621 Acute and chronic respiratory failure with hypoxia: Secondary | ICD-10-CM | POA: Diagnosis not present

## 2017-03-06 DIAGNOSIS — J441 Chronic obstructive pulmonary disease with (acute) exacerbation: Secondary | ICD-10-CM | POA: Diagnosis not present

## 2017-03-07 DIAGNOSIS — I130 Hypertensive heart and chronic kidney disease with heart failure and stage 1 through stage 4 chronic kidney disease, or unspecified chronic kidney disease: Secondary | ICD-10-CM | POA: Diagnosis not present

## 2017-03-07 DIAGNOSIS — N183 Chronic kidney disease, stage 3 (moderate): Secondary | ICD-10-CM | POA: Diagnosis not present

## 2017-03-07 DIAGNOSIS — J9621 Acute and chronic respiratory failure with hypoxia: Secondary | ICD-10-CM | POA: Diagnosis not present

## 2017-03-07 DIAGNOSIS — J441 Chronic obstructive pulmonary disease with (acute) exacerbation: Secondary | ICD-10-CM | POA: Diagnosis not present

## 2017-03-07 DIAGNOSIS — I5023 Acute on chronic systolic (congestive) heart failure: Secondary | ICD-10-CM | POA: Diagnosis not present

## 2017-03-07 DIAGNOSIS — I429 Cardiomyopathy, unspecified: Secondary | ICD-10-CM | POA: Diagnosis not present

## 2017-03-11 DIAGNOSIS — N200 Calculus of kidney: Secondary | ICD-10-CM | POA: Diagnosis not present

## 2017-03-11 DIAGNOSIS — I739 Peripheral vascular disease, unspecified: Secondary | ICD-10-CM | POA: Diagnosis not present

## 2017-03-11 DIAGNOSIS — F17210 Nicotine dependence, cigarettes, uncomplicated: Secondary | ICD-10-CM | POA: Diagnosis not present

## 2017-03-11 DIAGNOSIS — K219 Gastro-esophageal reflux disease without esophagitis: Secondary | ICD-10-CM | POA: Diagnosis not present

## 2017-03-11 DIAGNOSIS — J441 Chronic obstructive pulmonary disease with (acute) exacerbation: Secondary | ICD-10-CM | POA: Diagnosis not present

## 2017-03-11 DIAGNOSIS — I5023 Acute on chronic systolic (congestive) heart failure: Secondary | ICD-10-CM | POA: Diagnosis not present

## 2017-03-11 DIAGNOSIS — Z89612 Acquired absence of left leg above knee: Secondary | ICD-10-CM | POA: Diagnosis not present

## 2017-03-11 DIAGNOSIS — N401 Enlarged prostate with lower urinary tract symptoms: Secondary | ICD-10-CM | POA: Diagnosis not present

## 2017-03-11 DIAGNOSIS — M48061 Spinal stenosis, lumbar region without neurogenic claudication: Secondary | ICD-10-CM | POA: Diagnosis not present

## 2017-03-11 DIAGNOSIS — I130 Hypertensive heart and chronic kidney disease with heart failure and stage 1 through stage 4 chronic kidney disease, or unspecified chronic kidney disease: Secondary | ICD-10-CM | POA: Diagnosis not present

## 2017-03-11 DIAGNOSIS — Z9981 Dependence on supplemental oxygen: Secondary | ICD-10-CM | POA: Diagnosis not present

## 2017-03-11 DIAGNOSIS — N183 Chronic kidney disease, stage 3 (moderate): Secondary | ICD-10-CM | POA: Diagnosis not present

## 2017-03-11 DIAGNOSIS — I429 Cardiomyopathy, unspecified: Secondary | ICD-10-CM | POA: Diagnosis not present

## 2017-03-11 DIAGNOSIS — N132 Hydronephrosis with renal and ureteral calculous obstruction: Secondary | ICD-10-CM | POA: Diagnosis not present

## 2017-03-11 DIAGNOSIS — Z436 Encounter for attention to other artificial openings of urinary tract: Secondary | ICD-10-CM | POA: Diagnosis not present

## 2017-03-11 DIAGNOSIS — J9621 Acute and chronic respiratory failure with hypoxia: Secondary | ICD-10-CM | POA: Diagnosis not present

## 2017-03-11 DIAGNOSIS — Z993 Dependence on wheelchair: Secondary | ICD-10-CM | POA: Diagnosis not present

## 2017-03-13 DIAGNOSIS — I429 Cardiomyopathy, unspecified: Secondary | ICD-10-CM | POA: Diagnosis not present

## 2017-03-13 DIAGNOSIS — I130 Hypertensive heart and chronic kidney disease with heart failure and stage 1 through stage 4 chronic kidney disease, or unspecified chronic kidney disease: Secondary | ICD-10-CM | POA: Diagnosis not present

## 2017-03-13 DIAGNOSIS — J441 Chronic obstructive pulmonary disease with (acute) exacerbation: Secondary | ICD-10-CM | POA: Diagnosis not present

## 2017-03-13 DIAGNOSIS — J9621 Acute and chronic respiratory failure with hypoxia: Secondary | ICD-10-CM | POA: Diagnosis not present

## 2017-03-13 DIAGNOSIS — I5023 Acute on chronic systolic (congestive) heart failure: Secondary | ICD-10-CM | POA: Diagnosis not present

## 2017-03-13 DIAGNOSIS — N183 Chronic kidney disease, stage 3 (moderate): Secondary | ICD-10-CM | POA: Diagnosis not present

## 2017-03-16 DIAGNOSIS — I130 Hypertensive heart and chronic kidney disease with heart failure and stage 1 through stage 4 chronic kidney disease, or unspecified chronic kidney disease: Secondary | ICD-10-CM | POA: Diagnosis not present

## 2017-03-16 DIAGNOSIS — N183 Chronic kidney disease, stage 3 (moderate): Secondary | ICD-10-CM | POA: Diagnosis not present

## 2017-03-16 DIAGNOSIS — I429 Cardiomyopathy, unspecified: Secondary | ICD-10-CM | POA: Diagnosis not present

## 2017-03-16 DIAGNOSIS — J9621 Acute and chronic respiratory failure with hypoxia: Secondary | ICD-10-CM | POA: Diagnosis not present

## 2017-03-16 DIAGNOSIS — J441 Chronic obstructive pulmonary disease with (acute) exacerbation: Secondary | ICD-10-CM | POA: Diagnosis not present

## 2017-03-16 DIAGNOSIS — I5023 Acute on chronic systolic (congestive) heart failure: Secondary | ICD-10-CM | POA: Diagnosis not present

## 2017-03-20 DIAGNOSIS — I429 Cardiomyopathy, unspecified: Secondary | ICD-10-CM | POA: Diagnosis not present

## 2017-03-20 DIAGNOSIS — N183 Chronic kidney disease, stage 3 (moderate): Secondary | ICD-10-CM | POA: Diagnosis not present

## 2017-03-20 DIAGNOSIS — J441 Chronic obstructive pulmonary disease with (acute) exacerbation: Secondary | ICD-10-CM | POA: Diagnosis not present

## 2017-03-20 DIAGNOSIS — I5023 Acute on chronic systolic (congestive) heart failure: Secondary | ICD-10-CM | POA: Diagnosis not present

## 2017-03-20 DIAGNOSIS — J9621 Acute and chronic respiratory failure with hypoxia: Secondary | ICD-10-CM | POA: Diagnosis not present

## 2017-03-20 DIAGNOSIS — I130 Hypertensive heart and chronic kidney disease with heart failure and stage 1 through stage 4 chronic kidney disease, or unspecified chronic kidney disease: Secondary | ICD-10-CM | POA: Diagnosis not present

## 2017-03-21 DIAGNOSIS — I429 Cardiomyopathy, unspecified: Secondary | ICD-10-CM | POA: Diagnosis not present

## 2017-03-21 DIAGNOSIS — J9621 Acute and chronic respiratory failure with hypoxia: Secondary | ICD-10-CM | POA: Diagnosis not present

## 2017-03-21 DIAGNOSIS — I5023 Acute on chronic systolic (congestive) heart failure: Secondary | ICD-10-CM | POA: Diagnosis not present

## 2017-03-21 DIAGNOSIS — N183 Chronic kidney disease, stage 3 (moderate): Secondary | ICD-10-CM | POA: Diagnosis not present

## 2017-03-21 DIAGNOSIS — I130 Hypertensive heart and chronic kidney disease with heart failure and stage 1 through stage 4 chronic kidney disease, or unspecified chronic kidney disease: Secondary | ICD-10-CM | POA: Diagnosis not present

## 2017-03-21 DIAGNOSIS — J441 Chronic obstructive pulmonary disease with (acute) exacerbation: Secondary | ICD-10-CM | POA: Diagnosis not present

## 2017-03-26 DIAGNOSIS — I429 Cardiomyopathy, unspecified: Secondary | ICD-10-CM | POA: Diagnosis not present

## 2017-03-26 DIAGNOSIS — I5023 Acute on chronic systolic (congestive) heart failure: Secondary | ICD-10-CM | POA: Diagnosis not present

## 2017-03-26 DIAGNOSIS — N183 Chronic kidney disease, stage 3 (moderate): Secondary | ICD-10-CM | POA: Diagnosis not present

## 2017-03-26 DIAGNOSIS — J441 Chronic obstructive pulmonary disease with (acute) exacerbation: Secondary | ICD-10-CM | POA: Diagnosis not present

## 2017-03-26 DIAGNOSIS — J9621 Acute and chronic respiratory failure with hypoxia: Secondary | ICD-10-CM | POA: Diagnosis not present

## 2017-03-26 DIAGNOSIS — I130 Hypertensive heart and chronic kidney disease with heart failure and stage 1 through stage 4 chronic kidney disease, or unspecified chronic kidney disease: Secondary | ICD-10-CM | POA: Diagnosis not present

## 2017-03-30 DIAGNOSIS — I429 Cardiomyopathy, unspecified: Secondary | ICD-10-CM | POA: Diagnosis not present

## 2017-03-30 DIAGNOSIS — N183 Chronic kidney disease, stage 3 (moderate): Secondary | ICD-10-CM | POA: Diagnosis not present

## 2017-03-30 DIAGNOSIS — I130 Hypertensive heart and chronic kidney disease with heart failure and stage 1 through stage 4 chronic kidney disease, or unspecified chronic kidney disease: Secondary | ICD-10-CM | POA: Diagnosis not present

## 2017-03-30 DIAGNOSIS — I5023 Acute on chronic systolic (congestive) heart failure: Secondary | ICD-10-CM | POA: Diagnosis not present

## 2017-03-30 DIAGNOSIS — J441 Chronic obstructive pulmonary disease with (acute) exacerbation: Secondary | ICD-10-CM | POA: Diagnosis not present

## 2017-03-30 DIAGNOSIS — J9621 Acute and chronic respiratory failure with hypoxia: Secondary | ICD-10-CM | POA: Diagnosis not present

## 2017-04-03 DIAGNOSIS — I5023 Acute on chronic systolic (congestive) heart failure: Secondary | ICD-10-CM | POA: Diagnosis not present

## 2017-04-03 DIAGNOSIS — J9621 Acute and chronic respiratory failure with hypoxia: Secondary | ICD-10-CM | POA: Diagnosis not present

## 2017-04-03 DIAGNOSIS — N183 Chronic kidney disease, stage 3 (moderate): Secondary | ICD-10-CM | POA: Diagnosis not present

## 2017-04-03 DIAGNOSIS — I429 Cardiomyopathy, unspecified: Secondary | ICD-10-CM | POA: Diagnosis not present

## 2017-04-03 DIAGNOSIS — J441 Chronic obstructive pulmonary disease with (acute) exacerbation: Secondary | ICD-10-CM | POA: Diagnosis not present

## 2017-04-03 DIAGNOSIS — I130 Hypertensive heart and chronic kidney disease with heart failure and stage 1 through stage 4 chronic kidney disease, or unspecified chronic kidney disease: Secondary | ICD-10-CM | POA: Diagnosis not present

## 2017-04-06 DIAGNOSIS — I429 Cardiomyopathy, unspecified: Secondary | ICD-10-CM | POA: Diagnosis not present

## 2017-04-06 DIAGNOSIS — I5023 Acute on chronic systolic (congestive) heart failure: Secondary | ICD-10-CM | POA: Diagnosis not present

## 2017-04-06 DIAGNOSIS — I130 Hypertensive heart and chronic kidney disease with heart failure and stage 1 through stage 4 chronic kidney disease, or unspecified chronic kidney disease: Secondary | ICD-10-CM | POA: Diagnosis not present

## 2017-04-06 DIAGNOSIS — N183 Chronic kidney disease, stage 3 (moderate): Secondary | ICD-10-CM | POA: Diagnosis not present

## 2017-04-06 DIAGNOSIS — J9621 Acute and chronic respiratory failure with hypoxia: Secondary | ICD-10-CM | POA: Diagnosis not present

## 2017-04-06 DIAGNOSIS — J441 Chronic obstructive pulmonary disease with (acute) exacerbation: Secondary | ICD-10-CM | POA: Diagnosis not present

## 2017-04-10 DIAGNOSIS — I130 Hypertensive heart and chronic kidney disease with heart failure and stage 1 through stage 4 chronic kidney disease, or unspecified chronic kidney disease: Secondary | ICD-10-CM | POA: Diagnosis not present

## 2017-04-10 DIAGNOSIS — I429 Cardiomyopathy, unspecified: Secondary | ICD-10-CM | POA: Diagnosis not present

## 2017-04-10 DIAGNOSIS — I5023 Acute on chronic systolic (congestive) heart failure: Secondary | ICD-10-CM | POA: Diagnosis not present

## 2017-04-10 DIAGNOSIS — N183 Chronic kidney disease, stage 3 (moderate): Secondary | ICD-10-CM | POA: Diagnosis not present

## 2017-04-10 DIAGNOSIS — J441 Chronic obstructive pulmonary disease with (acute) exacerbation: Secondary | ICD-10-CM | POA: Diagnosis not present

## 2017-04-10 DIAGNOSIS — J9621 Acute and chronic respiratory failure with hypoxia: Secondary | ICD-10-CM | POA: Diagnosis not present

## 2017-04-11 DIAGNOSIS — N401 Enlarged prostate with lower urinary tract symptoms: Secondary | ICD-10-CM | POA: Diagnosis not present

## 2017-04-11 DIAGNOSIS — J9621 Acute and chronic respiratory failure with hypoxia: Secondary | ICD-10-CM | POA: Diagnosis not present

## 2017-04-11 DIAGNOSIS — M48061 Spinal stenosis, lumbar region without neurogenic claudication: Secondary | ICD-10-CM | POA: Diagnosis not present

## 2017-04-11 DIAGNOSIS — Z9981 Dependence on supplemental oxygen: Secondary | ICD-10-CM | POA: Diagnosis not present

## 2017-04-11 DIAGNOSIS — F17210 Nicotine dependence, cigarettes, uncomplicated: Secondary | ICD-10-CM | POA: Diagnosis not present

## 2017-04-11 DIAGNOSIS — J441 Chronic obstructive pulmonary disease with (acute) exacerbation: Secondary | ICD-10-CM | POA: Diagnosis not present

## 2017-04-11 DIAGNOSIS — Z993 Dependence on wheelchair: Secondary | ICD-10-CM | POA: Diagnosis not present

## 2017-04-11 DIAGNOSIS — N183 Chronic kidney disease, stage 3 (moderate): Secondary | ICD-10-CM | POA: Diagnosis not present

## 2017-04-11 DIAGNOSIS — N132 Hydronephrosis with renal and ureteral calculous obstruction: Secondary | ICD-10-CM | POA: Diagnosis not present

## 2017-04-11 DIAGNOSIS — I739 Peripheral vascular disease, unspecified: Secondary | ICD-10-CM | POA: Diagnosis not present

## 2017-04-11 DIAGNOSIS — Z436 Encounter for attention to other artificial openings of urinary tract: Secondary | ICD-10-CM | POA: Diagnosis not present

## 2017-04-11 DIAGNOSIS — K219 Gastro-esophageal reflux disease without esophagitis: Secondary | ICD-10-CM | POA: Diagnosis not present

## 2017-04-11 DIAGNOSIS — I130 Hypertensive heart and chronic kidney disease with heart failure and stage 1 through stage 4 chronic kidney disease, or unspecified chronic kidney disease: Secondary | ICD-10-CM | POA: Diagnosis not present

## 2017-04-11 DIAGNOSIS — I429 Cardiomyopathy, unspecified: Secondary | ICD-10-CM | POA: Diagnosis not present

## 2017-04-11 DIAGNOSIS — Z89612 Acquired absence of left leg above knee: Secondary | ICD-10-CM | POA: Diagnosis not present

## 2017-04-11 DIAGNOSIS — N200 Calculus of kidney: Secondary | ICD-10-CM | POA: Diagnosis not present

## 2017-04-11 DIAGNOSIS — I5023 Acute on chronic systolic (congestive) heart failure: Secondary | ICD-10-CM | POA: Diagnosis not present

## 2017-05-12 DEATH — deceased
